# Patient Record
Sex: Male | Born: 1950
Health system: Southern US, Community
[De-identification: ages and names within clinical notes are randomized; demographics above are authoritative.]

## PROBLEM LIST (undated history)

## (undated) DIAGNOSIS — N289 Disorder of kidney and ureter, unspecified: Secondary | ICD-10-CM

## (undated) DIAGNOSIS — M1990 Unspecified osteoarthritis, unspecified site: Secondary | ICD-10-CM

## (undated) DIAGNOSIS — C61 Malignant neoplasm of prostate: Secondary | ICD-10-CM

## (undated) DIAGNOSIS — H269 Unspecified cataract: Secondary | ICD-10-CM

## (undated) DIAGNOSIS — R778 Other specified abnormalities of plasma proteins: Secondary | ICD-10-CM

## (undated) DIAGNOSIS — F419 Anxiety disorder, unspecified: Secondary | ICD-10-CM

## (undated) DIAGNOSIS — R011 Cardiac murmur, unspecified: Secondary | ICD-10-CM

## (undated) DIAGNOSIS — I739 Peripheral vascular disease, unspecified: Secondary | ICD-10-CM

## (undated) DIAGNOSIS — I10 Essential (primary) hypertension: Secondary | ICD-10-CM

## (undated) DIAGNOSIS — N400 Enlarged prostate without lower urinary tract symptoms: Secondary | ICD-10-CM

## (undated) DIAGNOSIS — D472 Monoclonal gammopathy: Principal | ICD-10-CM

## (undated) HISTORY — PX: TONSILLECTOMY: SUR1361

## (undated) HISTORY — DX: Monoclonal gammopathy: D47.2

## (undated) HISTORY — PX: EYE SURGERY: SHX253

## (undated) HISTORY — PX: PROSTATE BIOPSY: SHX241

## (undated) HISTORY — PX: BACK SURGERY: SHX140

## (undated) HISTORY — PX: HERNIA REPAIR: SHX51

## (undated) HISTORY — PX: COLONOSCOPY: SHX174

## (undated) HISTORY — DX: Peripheral vascular disease, unspecified: I73.9

## (undated) HISTORY — DX: Other specified abnormalities of plasma proteins: R77.8

## (undated) HISTORY — PX: FRACTURE SURGERY: SHX138

## (undated) HISTORY — PX: HEMORROIDECTOMY: SUR656

---

## 2000-12-20 ENCOUNTER — Emergency Department (HOSPITAL_COMMUNITY): Admission: EM | Admit: 2000-12-20 | Discharge: 2000-12-20 | Payer: Self-pay | Admitting: Emergency Medicine

## 2000-12-20 ENCOUNTER — Encounter: Payer: Self-pay | Admitting: Emergency Medicine

## 2000-12-25 ENCOUNTER — Encounter: Admission: RE | Admit: 2000-12-25 | Discharge: 2000-12-25 | Payer: Self-pay | Admitting: Internal Medicine

## 2001-01-21 ENCOUNTER — Encounter: Payer: Self-pay | Admitting: Neurosurgery

## 2001-01-24 ENCOUNTER — Ambulatory Visit (HOSPITAL_COMMUNITY): Admission: RE | Admit: 2001-01-24 | Discharge: 2001-01-25 | Payer: Self-pay | Admitting: Neurosurgery

## 2001-01-24 ENCOUNTER — Encounter: Payer: Self-pay | Admitting: Neurosurgery

## 2001-02-18 ENCOUNTER — Encounter (HOSPITAL_COMMUNITY): Admission: RE | Admit: 2001-02-18 | Discharge: 2001-03-20 | Payer: Self-pay | Admitting: Neurosurgery

## 2001-03-27 ENCOUNTER — Encounter (HOSPITAL_COMMUNITY): Admission: RE | Admit: 2001-03-27 | Discharge: 2001-04-26 | Payer: Self-pay | Admitting: Neurosurgery

## 2003-05-18 ENCOUNTER — Emergency Department (HOSPITAL_COMMUNITY): Admission: EM | Admit: 2003-05-18 | Discharge: 2003-05-18 | Payer: Self-pay | Admitting: *Deleted

## 2003-05-18 ENCOUNTER — Encounter: Payer: Self-pay | Admitting: Emergency Medicine

## 2003-12-21 ENCOUNTER — Ambulatory Visit (HOSPITAL_COMMUNITY): Admission: RE | Admit: 2003-12-21 | Discharge: 2003-12-21 | Payer: Self-pay | Admitting: Internal Medicine

## 2004-03-11 ENCOUNTER — Ambulatory Visit (HOSPITAL_COMMUNITY): Admission: RE | Admit: 2004-03-11 | Discharge: 2004-03-11 | Payer: Self-pay | Admitting: Orthopedic Surgery

## 2007-05-27 ENCOUNTER — Ambulatory Visit (HOSPITAL_COMMUNITY): Admission: RE | Admit: 2007-05-27 | Discharge: 2007-05-27 | Payer: Self-pay | Admitting: Internal Medicine

## 2009-08-31 ENCOUNTER — Ambulatory Visit (HOSPITAL_COMMUNITY): Admission: RE | Admit: 2009-08-31 | Discharge: 2009-08-31 | Payer: Self-pay | Admitting: General Surgery

## 2010-10-19 ENCOUNTER — Ambulatory Visit (HOSPITAL_COMMUNITY)
Admission: RE | Admit: 2010-10-19 | Discharge: 2010-10-19 | Payer: Self-pay | Source: Home / Self Care | Attending: Urology | Admitting: Urology

## 2011-03-31 NOTE — Op Note (Signed)
NAME:  Gregory Diaz, Gregory Diaz                         ACCOUNT NO.:  [id number]   MEDICAL RECORD NO.:  [id number]                   PATIENT TYPE:  AMB   LOCATION:  DAY                                  FACILITY:  [facility]   PHYSICIAN:  Leroy C. Katrinka Blazing, M.D.                DATE OF BIRTH:  December 23, 1950   DATE OF PROCEDURE:  DATE OF DISCHARGE:                                 OPERATIVE REPORT   PREOPERATIVE DIAGNOSIS:  Incarcerated umbilical hernia.   POSTOPERATIVE DIAGNOSIS:  Incarcerated umbilical hernia.   PROCEDURE:  Umbilical hernia repair.   SURGEON:  Dirk Dress. Katrinka Blazing, M.D.   DESCRIPTION OF PROCEDURE:  Under general anesthesia, the patient's abdomen  was prepped and draped in a sterile field.   An infraumbilical curvilinear incision was made.  The incision extended down  to the hernia sac.  The hernia sac was separated from the overlying skin  with electrocautery.  The sac was dissected down to the neck of the hernia  defect.  The fascia was opened transversely to allow the contents of the sac  to be reintroduced into the peritoneal cavity.  This was done without  difficulty.  The edges of the hernia were dissected so that good fascia  could be reapproximated.  The defect was then closed transversely using  multiple figure-of-eight sutures of 0 Prolene.  The skin was sutured down to  the fascia and subcutaneous tissue.  The skin was closed with 4-0 Dexon.  A  dressing was placed.   The patient tolerated the procedure well.  He was awakened from anesthesia  uneventfully, transferred to a bed, and taken to the postanesthetic care  unit for monitoring.      ___________________________________________                                            Dirk Dress. Katrinka Blazing, M.D.   LCS/MEDQ  D:  03/11/2004  T:  03/11/2004  Job:  [id number]   cc:   Tesfaye D. Felecia Shelling, M.D.  [address]  [place]  [place] [address]  Fax: [phone]

## 2011-03-31 NOTE — H&P (Signed)
NAME:  Gregory Diaz, Gregory Diaz                         ACCOUNT NO.:  [id number]   MEDICAL RECORD NO.:  [id number]                   PATIENT TYPE:  AMB   LOCATION:  DAY                                  FACILITY:  [facility]   PHYSICIAN:  Leroy C. Katrinka Blazing, M.D.                DATE OF BIRTH:  [birth year]   DATE OF ADMISSION:  DATE OF DISCHARGE:                                HISTORY & PHYSICAL   HISTORY OF PRESENT ILLNESS:  [age]-year-old male with history of a  painful enlarging umbilicus.  He has had no nausea or vomiting.  The  umbilicus enlarged while he was straining. But it has not reduced in size.  Examination reveals an incarcerated umbilical hernia, which is mildly  tender.  He is scheduled for umbilical hernia repair.   PAST HISTORY:  The patient has hypertension and he takes an antihypertensive  medication that he cannot remember.  He has no other medical illness.   PAST SURGICAL HISTORY:  The patient's only surgery is a hemorrhoidectomy.   SOCIAL HISTORY:  The patient is married and employed.  He does not smoke or  use drugs.   PHYSICAL EXAMINATION:  VITAL SIGNS:  On examination blood pressure is  130/90, pulse 76, respirations 18 and weight 305 pounds.  HEENT:  Unremarkable,  NECK:  Neck is supple.  CHEST:  Chest is clear to auscultation.  HEART:  Heart has a regular rate and rhythm no murmur, gallop or rub.  ABDOMEN:  Abdomen is soft and nontender, except for the periumbilical area  where he has an enlarged, mildly tender umbilical hernia.  The hernial  defect cannot be felt because of what is probably incarcerated fat.  EXTREMITIES:  No cyanosis, clubbing or edema.  NEUROLOGIC:  Neurologic exam is nonfocal.   IMPRESSION:  1. Incarcerated umbilical hernia.  2. Hypertension.   PLAN:  Umbilical hernia repair.     ___________________________________________                                         Dirk Dress. Katrinka Blazing, M.D.   LCS/MEDQ  D:  03/11/2004  T:  03/11/2004  Job:   [id number]
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
## 2011-05-06 ENCOUNTER — Emergency Department (HOSPITAL_COMMUNITY): Payer: BC Managed Care – PPO

## 2011-05-06 ENCOUNTER — Inpatient Hospital Stay (INDEPENDENT_AMBULATORY_CARE_PROVIDER_SITE_OTHER)
Admission: RE | Admit: 2011-05-06 | Discharge: 2011-05-06 | Disposition: A | Discharge status: Home / Self Care | Payer: BC Managed Care – PPO | Source: Ambulatory Visit | Attending: Family Medicine | Admitting: Family Medicine

## 2011-05-06 ENCOUNTER — Emergency Department (HOSPITAL_COMMUNITY)
Admission: EM | Admit: 2011-05-06 | Discharge: 2011-05-07 | Disposition: A | Discharge status: Home / Self Care | Payer: BC Managed Care – PPO | Attending: Emergency Medicine | Admitting: Emergency Medicine

## 2011-05-06 DIAGNOSIS — I10 Essential (primary) hypertension: Secondary | ICD-10-CM | POA: Insufficient documentation

## 2011-05-06 DIAGNOSIS — M7989 Other specified soft tissue disorders: Secondary | ICD-10-CM

## 2011-05-06 DIAGNOSIS — Y92009 Unspecified place in unspecified non-institutional (private) residence as the place of occurrence of the external cause: Secondary | ICD-10-CM | POA: Insufficient documentation

## 2011-05-06 DIAGNOSIS — IMO0002 Reserved for concepts with insufficient information to code with codable children: Secondary | ICD-10-CM | POA: Insufficient documentation

## 2011-05-06 DIAGNOSIS — M79609 Pain in unspecified limb: Secondary | ICD-10-CM | POA: Insufficient documentation

## 2011-05-06 DIAGNOSIS — W010XXA Fall on same level from slipping, tripping and stumbling without subsequent striking against object, initial encounter: Secondary | ICD-10-CM | POA: Insufficient documentation

## 2011-05-07 DIAGNOSIS — M79609 Pain in unspecified limb: Secondary | ICD-10-CM

## 2012-02-27 ENCOUNTER — Ambulatory Visit (INDEPENDENT_AMBULATORY_CARE_PROVIDER_SITE_OTHER): Payer: Self-pay | Admitting: Urology

## 2012-02-27 DIAGNOSIS — N509 Disorder of male genital organs, unspecified: Secondary | ICD-10-CM

## 2012-02-27 DIAGNOSIS — R39198 Other difficulties with micturition: Secondary | ICD-10-CM

## 2012-02-27 DIAGNOSIS — N529 Male erectile dysfunction, unspecified: Secondary | ICD-10-CM

## 2012-03-31 ENCOUNTER — Other Ambulatory Visit: Payer: Self-pay | Admitting: Family Medicine

## 2012-03-31 ENCOUNTER — Ambulatory Visit: Payer: Self-pay | Admitting: Family Medicine

## 2012-03-31 VITALS — BP 124/80 | HR 74 | Temp 97.9°F | Resp 16 | Ht 71.75 in | Wt 321.8 lb

## 2012-03-31 DIAGNOSIS — N498 Inflammatory disorders of other specified male genital organs: Secondary | ICD-10-CM

## 2012-03-31 DIAGNOSIS — I10 Essential (primary) hypertension: Secondary | ICD-10-CM | POA: Insufficient documentation

## 2012-03-31 DIAGNOSIS — N492 Inflammatory disorders of scrotum: Secondary | ICD-10-CM

## 2012-03-31 MED ORDER — DOXYCYCLINE HYCLATE 100 MG PO TABS: 100.0000 mg | ORAL_TABLET | Freq: Two times a day (BID) | ORAL | Status: AC

## 2012-03-31 NOTE — Progress Notes (Signed)
I have seen this patient with Rhoderick Moody, PA-C and agree with plan.

## 2012-03-31 NOTE — Patient Instructions (Signed)
Follow up in 2 days Take antibiotic twice a day

## 2012-03-31 NOTE — Progress Notes (Signed)
This 60 year old gentleman comes in with a recurrence of a scrotal mass on the left side. This is been diagnosed before as a boil and it was drained is primary care doctor's office. Now several years later, the swelling his back with pain and soreness. He is seeking drainage of this mass.  Objective: Patient has a 3 cm x 2 cm oval, tender subcutaneous mass in the scrotum.  The physician's assistant note is attached  Assessment: Abscess left scrotum, recurrent  Plan: Doxycycline 100 twice a day x10 days, followup in 2 days. Abscess culture has been obtained

## 2012-03-31 NOTE — Progress Notes (Signed)
VCO. Local anesthesia with 4 cc 2% lidocaine plain. 1 cm incision made with #11 blade. Copious purulent discharge expressed and culture obtained. Wound was packed with 1/4" plain packing. Area was cleaned and then dressed with Telfa and Hypafix. Patient tolerated procedure well.

## 2012-04-03 ENCOUNTER — Ambulatory Visit (INDEPENDENT_AMBULATORY_CARE_PROVIDER_SITE_OTHER): Payer: Self-pay | Admitting: Physician Assistant

## 2012-04-03 VITALS — BP 132/92 | HR 81 | Temp 97.9°F | Resp 18

## 2012-04-03 DIAGNOSIS — L0291 Cutaneous abscess, unspecified: Secondary | ICD-10-CM

## 2012-04-03 DIAGNOSIS — L0390 Cellulitis, unspecified: Secondary | ICD-10-CM

## 2012-04-03 NOTE — Progress Notes (Signed)
  Subjective:    Patient ID: Gregory Pu Hiraldo Sr., male    DOB: Apr 06, 1951, 61 y.o.   MRN: [id number]  HPI Gregory Diaz comes in today for follow up s/p I & D scrotal abscess on 03/31/12. He states that the packing came out that night and is now just coming back in,  He is taking the antibiotic without problem.  He has minimal pain and states that he has not had any new swelling or active drainage that he can tell. No fever or chills   Review of Systems As noted in HPI     Objective:   Physical Exam  Left scrotum with minimal swelling and induration.  Original incision has mostly healed closed.  No drainage expressed.        Assessment & Plan:  Abscess, scrotum  Heat and keep clean.  Return 2 days for recheck to make sure it is not swelling as there was a premature healing of incision.  Finish abx.

## 2012-04-04 LAB — WOUND CULTURE: Gram Stain: NONE SEEN

## 2012-04-15 ENCOUNTER — Other Ambulatory Visit: Payer: Self-pay | Admitting: Family Medicine

## 2012-05-24 ENCOUNTER — Encounter (HOSPITAL_COMMUNITY): Payer: Self-pay | Admitting: Emergency Medicine

## 2012-05-24 DIAGNOSIS — I10 Essential (primary) hypertension: Secondary | ICD-10-CM | POA: Insufficient documentation

## 2012-05-24 DIAGNOSIS — F17200 Nicotine dependence, unspecified, uncomplicated: Secondary | ICD-10-CM | POA: Insufficient documentation

## 2012-05-24 DIAGNOSIS — IMO0002 Reserved for concepts with insufficient information to code with codable children: Secondary | ICD-10-CM | POA: Insufficient documentation

## 2012-05-24 NOTE — ED Notes (Addendum)
Starting Wednesday,reports that thigh to calf- feels like "vibrating"; reports running fevers; pt able move L leg- pt was ambulatory to triage, but does report feeling weaker in leg; no swelling and no redness noted to leg

## 2012-05-25 ENCOUNTER — Emergency Department (HOSPITAL_COMMUNITY)
Admission: EM | Admit: 2012-05-25 | Discharge: 2012-05-25 | Disposition: A | Discharge status: Home / Self Care | Payer: Self-pay | Attending: Emergency Medicine | Admitting: Emergency Medicine

## 2012-05-25 DIAGNOSIS — M5410 Radiculopathy, site unspecified: Secondary | ICD-10-CM

## 2012-05-25 HISTORY — DX: Essential (primary) hypertension: I10

## 2012-05-25 MED ORDER — HYDROCODONE-ACETAMINOPHEN 5-325 MG PO TABS: 1.0000 | ORAL_TABLET | Freq: Four times a day (QID) | ORAL | Status: AC | PRN

## 2012-05-25 MED ORDER — HYDROCODONE-ACETAMINOPHEN 5-325 MG PO TABS
1.0000 | ORAL_TABLET | Freq: Once | ORAL | Status: AC
  Administered 2012-05-25: 1 via ORAL
  Filled 2012-05-25: qty 1

## 2012-05-25 MED ORDER — IBUPROFEN 800 MG PO TABS: 800.0000 mg | ORAL_TABLET | Freq: Three times a day (TID) | ORAL | Status: AC | PRN

## 2012-05-25 NOTE — ED Provider Notes (Signed)
History     CSN: [id number]  Arrival date & time 05/24/12  2302   First MD Initiated Contact with Patient 05/25/12 0202      Chief Complaint  Patient presents with  . Leg Pain    (Consider location/radiation/quality/duration/timing/severity/associated sxs/prior treatment) HPI Comments: Patient reports throbbing pain in left leg x 6 days.   Extends from his left hip to his left foot, mainly located in his anterior left thigh.  Pain is 9/10 intensity and intermittent - worse with turning over onto the affected side and walking.   Does have intermittent tingling and numbness in his left thigh.  He has never had a pain like this previously. Has hx L3-4 disc surgery 2003.   Denies fever, chills.  No known injury.  Pt states on exam that he no longer has this pain or any weakness or numbness, that after laying in fast track waiting to be seen he is back to normal - notes he took an 800mg  ibuprofen prior to arrival.      The history is provided by the patient.    Past Medical History  Diagnosis Date  . Hypertension     Past Surgical History  Procedure Date  . Hemorroidectomy   . Back surgery     No family history on file.  History  Substance Use Topics  . Smoking status: Current Everyday Smoker -- 1.0 packs/day  . Smokeless tobacco: Not on file  . Alcohol Use: No      Review of Systems  Constitutional: Negative for fever and chills.  Gastrointestinal: Negative for nausea, vomiting, abdominal pain and diarrhea.  Genitourinary: Negative for dysuria, urgency and frequency.  Musculoskeletal: Positive for back pain.    Allergies  Penicillins  Home Medications   Current Outpatient Rx  Name Route Sig Dispense Refill  . IBUPROFEN 200 MG PO TABS Oral Take 400 mg by mouth every 8 (eight) hours as needed. pain    . LISINOPRIL-HYDROCHLOROTHIAZIDE 20-12.5 MG PO TABS Oral Take 1 tablet by mouth daily.      BP 152/103  Pulse 92  Temp 98.2 F (36.8 C) (Oral)  Resp 22  SpO2  95%  Physical Exam  Nursing note and vitals reviewed. Constitutional: He is oriented to person, place, and time. He appears well-developed and well-nourished. No distress.  HENT:  Head: Normocephalic and atraumatic.  Neck: Neck supple.  Cardiovascular: Normal rate, regular rhythm and normal heart sounds.   Pulmonary/Chest: Breath sounds normal. No respiratory distress. He has no wheezes. He has no rales. He exhibits no tenderness.  Abdominal: Soft. Bowel sounds are normal. He exhibits no distension. There is no tenderness. There is no rebound and no guarding.  Musculoskeletal: Normal range of motion. He exhibits no edema and no tenderness.       Left hip: Normal.       Left knee: Normal.       Left ankle: Normal.       Cervical back: He exhibits no bony tenderness.       Thoracic back: He exhibits no bony tenderness.       Lumbar back: He exhibits no bony tenderness.       Lower extremities: strength 5/5 throughout, sensation intact, distal pulses intact.  Gait is normal.     Neurological: He is alert and oriented to person, place, and time.  Skin: He is not diaphoretic.    ED Course  Procedures (including critical care time)  Labs Reviewed - No data to display  No results found.  On my examination, patient had become asymptomatic.    1. Radicular pain       MDM  Patient with hx consistent with lumbar radicular pain.  On my exam he was neurologically completely intact, no deficits.  Gait is normal.  Symptoms had resolved with rest and ibuprofen.  Per history, it appears that pt has pain that when it is intense makes him feel weak in his leg.  I do not see a need for imaging at this time.  No red flags for back pain.  Pt d/c home with pain medication, PCP and neurosurgery (his own) follow up. Return precautions given.  Patient verbalizes understanding and agrees with plan.          Dillard Cannon [place], [place] 05/25/12 2035
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
## 2012-05-26 NOTE — ED Provider Notes (Signed)
Medical screening examination/treatment/procedure(s) were performed by non-physician practitioner and as supervising physician I was immediately available for consultation/collaboration.    Vida Roller, MD 05/26/12 [phone]

## 2012-09-10 ENCOUNTER — Other Ambulatory Visit (HOSPITAL_COMMUNITY): Payer: Self-pay | Admitting: Internal Medicine

## 2012-09-10 ENCOUNTER — Ambulatory Visit (HOSPITAL_COMMUNITY)
Admission: RE | Admit: 2012-09-10 | Discharge: 2012-09-10 | Disposition: A | Discharge status: Home / Self Care | Payer: BC Managed Care – PPO | Source: Ambulatory Visit | Attending: Internal Medicine | Admitting: Internal Medicine

## 2012-09-10 DIAGNOSIS — M7989 Other specified soft tissue disorders: Secondary | ICD-10-CM

## 2012-09-10 DIAGNOSIS — M79609 Pain in unspecified limb: Secondary | ICD-10-CM | POA: Insufficient documentation

## 2012-10-15 ENCOUNTER — Other Ambulatory Visit (HOSPITAL_COMMUNITY): Payer: Self-pay | Admitting: Internal Medicine

## 2012-10-23 ENCOUNTER — Other Ambulatory Visit (HOSPITAL_COMMUNITY): Payer: Self-pay | Admitting: Internal Medicine

## 2012-10-23 ENCOUNTER — Ambulatory Visit (HOSPITAL_COMMUNITY)
Admission: RE | Admit: 2012-10-23 | Discharge: 2012-10-23 | Disposition: A | Discharge status: Home / Self Care | Payer: BC Managed Care – PPO | Source: Ambulatory Visit | Attending: Internal Medicine | Admitting: Internal Medicine

## 2012-10-23 ENCOUNTER — Ambulatory Visit (HOSPITAL_COMMUNITY)
Admission: RE | Admit: 2012-10-23 | Discharge: 2012-10-23 | Disposition: A | Discharge status: Home / Self Care | Payer: BC Managed Care – PPO | Source: Ambulatory Visit | Attending: Physical Medicine and Rehabilitation | Admitting: Physical Medicine and Rehabilitation

## 2012-10-23 DIAGNOSIS — M545 Low back pain, unspecified: Secondary | ICD-10-CM | POA: Insufficient documentation

## 2012-10-23 DIAGNOSIS — M79609 Pain in unspecified limb: Secondary | ICD-10-CM | POA: Insufficient documentation

## 2012-10-23 DIAGNOSIS — I10 Essential (primary) hypertension: Secondary | ICD-10-CM | POA: Insufficient documentation

## 2012-10-23 DIAGNOSIS — IMO0001 Reserved for inherently not codable concepts without codable children: Secondary | ICD-10-CM | POA: Insufficient documentation

## 2012-10-23 DIAGNOSIS — M5410 Radiculopathy, site unspecified: Secondary | ICD-10-CM | POA: Insufficient documentation

## 2012-10-23 NOTE — Evaluation (Addendum)
Physical Therapy Evaluation  Patient Details  Name: Gregory Gervase Lemus Sr. MRN: [id number] Date of Birth: May 10, 1951  Today's Date: 10/23/2012 Time: 1110-1205 PT Time Calculation (min): 55 min Charges: 1 eval, 10' NMR Visit#: 1  of 12   Re-eval: 11/22/12 Assessment Diagnosis: R leg pain Next MD Visit: Dr. Ethelene Hal - 11/22/11  Past Medical History:  Past Medical History  Diagnosis Date  . Hypertension    Past Surgical History:  Past Surgical History  Procedure Date  . Hemorroidectomy   . Back surgery     Subjective Symptoms/Limitations Pertinent History: Pt is a referred to PT for L leg pain with a significant hx of postlamiectomy about 10 years ago.  he reports that 10 weeks ago he had the flu and he was coughing a lot which increased his pain to his low back and had shooting pain, numbness, tingling, to L LE.  He is currently taking the short dose of prednisone.   How long can you sit comfortably?: less than 30 minutes How long can you stand comfortably?: 2-3 minutes How long can you walk comfortably?: less than 100 feet Pain Assessment Currently in Pain?: Yes Pain Score:   8 Pain Location: Back Pain Orientation: Left Pain Type: Acute pain;Chronic pain Pain Radiating Towards: Left leg Pain Relieving Factors: prednisone Effect of Pain on Daily Activities: unable to recieve comfortable postion, pain during work.   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 ) Coordination Gross Motor Movements are Fluid and Coordinated: No Coordination and  Movement Description: maximal impairment to core musculature Functional Tests Functional Tests: ODI: 72%  Assessment LLE AROM (degrees) LLE Overall AROM Comments: Prone hip IR -10 degrees to neutral LLE Strength Left Hip Flexion: 4/5 Left Hip Extension: 3-/5 Left Hip ABduction: 3+/5 Left Knee Flexion: 4/5 Left Knee Extension: 4/5 Left Ankle Dorsiflexion: 4/5 Lumbar AROM Overall Lumbar AROM Comments: All WNL without back pain Palpation Palpation: pain and tenderness to L gluteal region, anterior hip flexor.   Mobility/Balance  Ambulation/Gait Ambulation/Gait: Yes Gait Pattern: Antalgic;Trunk flexed Posture/Postural Control Posture/Postural Control: Postural limitations Postural Limitations: significant upper cross and lower cross syndrome.  Static Standing Balance Single Leg Stance - Right Leg: 2  Single Leg Stance - Left Leg: 3  Tandem Stance - Right Leg: 3  (increased leg pain) Tandem Stance - Left Leg: 3  (increased leg pain) Rhomberg - Eyes Opened: 10  Rhomberg - Eyes Closed: 10    Exercise/Treatments Seated Other Seated Lumbar Exercises: Heel roll outs 3x10 sec Supine Ab Set: Limitations AB Set Limitations: TC and VC for activation Bridge: 10 reps Other Supine Lumbar Exercises: NMR for PF contraction using VC Sidelying Hip Abduction: 10 reps Quadruped Other Quadruped Lumbar Exercises: NMR for multifidus activation    Physical Therapy Assessment and Plan PT Assessment and Plan Clinical Impression Statement: Pt is a 61 year old male referred to PT seconday to L leg radiculopathy without back pain.  After examination it was found that he has significant core weakness, postural abnormalities and singificant impairment to hip ROM.  Pt will benefit from skilled therapeutic intervention in order to improve on the following deficits: Abnormal gait;Decreased balance;Decreased coordination;Decreased  activity tolerance;Decreased strength;Pain;Improper body  mechanics;Impaired sensation;Impaired tone Rehab Potential: Good PT Frequency: Min 3X/week PT Duration: 8 weeks PT Treatment/Interventions: Gait training;Stair training;Functional mobility training;Therapeutic activities;Therapeutic exercise;Balance training;Neuromuscular re-education;Patient/family education;Manual techniques;Modalities PT Plan: lumbar traction if needed.  Concentrate on core activitiation, heel and toe roll in and outs, bridges, HS stretch, nerve glides if necessary, encourage appropriate posture.  Progress to t-band     Goals Home Exercise Program Pt will Perform Home Exercise Program: Independently PT Goal: Perform Home Exercise Program - Progress: Goal set today PT Short Term Goals Time to Complete Short Term Goals: 4 weeks PT Short Term Goal 1: Pt will report pain less than 5/10 for 50% of his day for improved QOL.  PT Short Term Goal 2: Pt will improve his LE strength by 1 muscle grade. PT Short Term Goal 3: Pt will report decrease in radicular symptoms by 50% and report improvement in symtoms.  PT Short Term Goal 4: Pt will demonstrate independent core coordinated movements. PT Short Term Goal 5: Pt will be throughly educated on proper lifiting techniques and be able to verablize importance of posture.  PT Long Term Goals Time to Complete Long Term Goals: 8 weeks PT Long Term Goal 1: Pt will improve ODI to less than 40% for improved QOL.  PT Long Term Goal 2: Pt will improve core coordination to [facility] in order to tolerate standing for greater than 1 hour to continue with home activities.  Long Term Goal 3: Pt will improve hip and ankle strategy and demonstrate static standing balance on solid surface x30 on R and L LE  Long Term Goal 4: Pt will improve core and LE strength to WNL in order to tolerate standing and walking for greater than 30 minutes to improve QOL.   Problem List Patient Active Problem List  Diagnosis  . Hypertension  . Radiculopathy of leg   Niccole Witthuhn,PT 10/23/2012, 1:48 PM  Physician Documentation Your signature is required to indicate approval of the treatment plan as stated above.  Please sign and either send electronically or make a copy of this report for your files and return this physician signed original.   Please mark one 1.__approve of plan  2. ___approve of plan with the following conditions.   ______________________________                                                          _____________________ Physician Signature                                                                                                             Date

## 2012-10-25 ENCOUNTER — Ambulatory Visit (HOSPITAL_COMMUNITY): Payer: BC Managed Care – PPO

## 2012-10-29 ENCOUNTER — Ambulatory Visit (HOSPITAL_COMMUNITY)
Admission: RE | Admit: 2012-10-29 | Discharge: 2012-10-29 | Disposition: A | Discharge status: Home / Self Care | Payer: BC Managed Care – PPO | Source: Ambulatory Visit | Attending: Physical Medicine and Rehabilitation | Admitting: Physical Medicine and Rehabilitation

## 2012-10-29 ENCOUNTER — Other Ambulatory Visit (HOSPITAL_COMMUNITY): Payer: Self-pay | Admitting: Physical Medicine and Rehabilitation

## 2012-10-29 DIAGNOSIS — M545 Low back pain, unspecified: Secondary | ICD-10-CM

## 2012-10-29 NOTE — Progress Notes (Signed)
Physical Therapy Treatment Patient Details  Name: Gregory Bertino Daw Sr. MRN: [id number] Date of Birth: Feb 04, 1951  Today's Date: 10/29/2012 Time: 4098-1191 PT Time Calculation (min): 67 min  Visit#: 2  of 12   Re-eval: 11/22/12  Charge: therex 42', manual 8', traction 17'   Subjective: Symptoms/Limitations Symptoms: Pt reported anterior L groin pain with radicular pain to lateral lower LE pain scale 7/10. Pain Assessment Currently in Pain?: Yes Pain Score:   7 Pain Location: Leg Pain Orientation: Left  Objective:   Exercise/Treatments Stretches Passive Hamstring Stretch: 3 reps;30 seconds Standing Other Standing Lumbar Exercises: gastroc stretch with slant board 2x 30" Seated Other Seated Lumbar Exercises: Heel roll outs 10x10 sec with orange ball between knees Supine Ab Set: Limitations AB Set Limitations: TC and VC for TrA and PFC activation 10x 10" Bridge: 10 reps Sidelying Hip Abduction: 10 reps Prone  Other Prone Lumbar Exercises: NMR for multifidus activation max tactile cueing 5x 10"  Modalities Modalities: Traction Manual Therapy Manual Therapy: Other (comment) Other Manual Therapy: Sciatic nerve glides L LE x 8 min Traction Type of Traction: Lumbar Max (lbs): 140 static Hold Time: static Time: 53'  Physical Therapy Assessment and Plan PT Assessment and Plan Clinical Impression Statement: Began PT treatment for core and LE strengthening, manual techniques for sciatic nerve glides and ended session with lumbar traction to reduce radiating pain.  Pt required tactile cueing for TrA activation and cueing for posture with sitting activity.  Pt stated pain scale overall reduced and no longer c/o radiating pain to foot but does have pain lateral calf region and groin following traction.  Pt recommend to drink extra water following session to reduce risk of headaches following new modality. PT Plan: Assess relief following lumbar traction.  Continue with  concentration on core activitation.  Progress to tband for postural strengthening.    Goals    Problem List Patient Active Problem List  Diagnosis  . Hypertension  . Radiculopathy of leg    PT - End of Session Activity Tolerance: Patient tolerated treatment well General Behavior During Session: [facility] for tasks performed Cognition: [facility] for tasks performed  GP    Juel Burrow 10/29/2012, 12:27 PM

## 2012-10-30 ENCOUNTER — Ambulatory Visit (HOSPITAL_COMMUNITY)
Admission: RE | Admit: 2012-10-30 | Discharge: 2012-10-30 | Disposition: A | Discharge status: Home / Self Care | Payer: BC Managed Care – PPO | Source: Ambulatory Visit | Attending: Physical Medicine and Rehabilitation | Admitting: Physical Medicine and Rehabilitation

## 2012-10-30 NOTE — Progress Notes (Signed)
Physical Therapy Treatment Patient Details  Name: Gregory Beier Newhouse Sr. MRN: [id number] Date of Birth: [birth year]  Today's Date: 10/30/2012 Time: 4098-1191 PT Time Calculation (min): 62 min Charges: 10' manual, 25' NMR, 1 Traction Visit#: 3  of 12   Re-eval: 11/22/12   Subjective: Symptoms/Limitations Symptoms: I'm still just having constant pain down my leg.  Pain Assessment Currently in Pain?: Yes Pain Score:   7 Pain Location: Back Pain Orientation: Left Pain Type: Acute pain;Chronic pain  Precautions/Restrictions     Exercise/Treatments Stretches Prone on Elbows Stretch: 1 rep;Limitations Prone on Elbows Stretch Limitations: 2 minutes Standing Scapular Retraction: Both;10 reps;Theraband (VC's and TC's for posture) Theraband Level (Scapular Retraction): Level 3 (Green) Row: Both;10 reps;Theraband (VC's and TC's for posture) Theraband Level (Row): Level 3 (Green) Shoulder Extension: Both;10 reps;Theraband (VC's and TC's for posture) Theraband Level (Shoulder Extension): Level 3 (Green) Other Standing Lumbar Exercises: R hip sag x15 Other Standing Lumbar Exercises: standing postural education x5 minutes w/VC and TC's Seated Other Seated Lumbar Exercises: seated postural training w/VC and TC for correct posture and TrA contraction 10x10 sec holds Sidelying Hip Abduction: 10 reps Other Sidelying Lumbar Exercises: R S/L press up x 2 minutes Prone  Single Arm Raise: Right;Left;5 reps Straight Leg Raise: 10 reps (Right and Left)  Modalities Modalities: Traction Manual Therapy Manual Therapy: Other (comment) Other Manual Therapy: Prone nerve glides  Traction Type of Traction: Lumbar Max (lbs): 140 static Hold Time: static Time: 69'  Physical Therapy Assessment and Plan PT Assessment and Plan Clinical Impression Statement: Pt has significant weakness to L LE and needs constant cueing for motivation and to continue.  heavy focus on improving sitting and standing  posture and cueing to maintain posture,  Encouraged pt to continue with core activiation and work on posture during driving for work.  he responded best to standing R hip sag to decrease LE radicular intensity. Reports decrease in pain after traction.  PT Plan: Assess relief following lumbar traction.  Continue with concentration on core activitation and addressing goals.     Goals    Problem List Patient Active Problem List  Diagnosis  . Hypertension  . Radiculopathy of leg    PT - End of Session Activity Tolerance: Patient tolerated treatment well General Behavior During Session: [facility] for tasks performed Cognition: [facility] for tasks performed  Arnett Duddy, PT 10/30/2012, 9:53 AM

## 2012-11-05 ENCOUNTER — Ambulatory Visit (HOSPITAL_COMMUNITY)
Admission: RE | Admit: 2012-11-05 | Discharge: 2012-11-05 | Disposition: A | Discharge status: Home / Self Care | Payer: BC Managed Care – PPO | Source: Ambulatory Visit | Attending: Physical Medicine and Rehabilitation | Admitting: Physical Medicine and Rehabilitation

## 2012-11-05 NOTE — Progress Notes (Signed)
Physical Therapy Treatment Patient Details  Name: Gregory Hamada Middlekauff Sr. MRN: [id number] Date of Birth: January 11, 1951  Today's Date: 11/05/2012 Time: 4098-1191 PT Time Calculation (min): 66 min  Visit#: 4  of 12   Re-eval: 11/22/12  Charge: therex 23 (PT aide ended therex session- no charge) ', traction 17'   Subjective: Symptoms/Limitations Symptoms: Pt stated pain reduced following last session, pt reported L knee and foot numbness pain scale 7/10. Pain Assessment Currently in Pain?: Yes Pain Score:   7 Pain Location: Leg Pain Orientation: Left  Objective:  Exercise/Treatments Standing Scapular Retraction: 15 reps;Theraband Theraband Level (Scapular Retraction): Level 3 (Green) Row: Both;10 reps;Theraband Theraband Level (Row): Level 3 (Green) Shoulder Extension: Both;10 reps;Theraband Theraband Level (Shoulder Extension): Level 3 (Green) Other Standing Lumbar Exercises: R hip sag x10 Seated Other Seated Lumbar Exercises: heel roll out Sidelying Hip Abduction: 10 reps Other Sidelying Lumbar Exercises: R S/L press up x 2 minutes Prone  Single Arm Raise: Right;Left;5 reps Straight Leg Raise: 10 reps  Modalities Modalities: Traction Traction Type of Traction: Lumbar Max (lbs): 140 static Hold Time: static Time: 27'  Physical Therapy Assessment and Plan PT Assessment and Plan Clinical Impression Statement: Pt continues to required constant cueing for motivation for complete exercises with correct form, posture and correct musculature activtion.  Pt limited by pain with standing activties and required several restbreaks to complete tasks.  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 PT Plan: Continue with concentraction on core activation and work towards goals.      Goals    Problem List Patient Active Problem List  Diagnosis  . Hypertension  . Radiculopathy of leg    PT - End of  Session Activity Tolerance: Patient tolerated treatment well General Behavior During Session: [facility] for tasks performed Cognition: [facility] for tasks performed  GP    Juel Burrow 11/05/2012, 1:12 PM

## 2012-11-12 ENCOUNTER — Ambulatory Visit (HOSPITAL_COMMUNITY): Payer: BC Managed Care – PPO

## 2012-11-12 ENCOUNTER — Inpatient Hospital Stay (HOSPITAL_COMMUNITY): Admission: RE | Admit: 2012-11-12 | Payer: BC Managed Care – PPO | Source: Ambulatory Visit

## 2012-11-19 ENCOUNTER — Ambulatory Visit (HOSPITAL_COMMUNITY): Payer: BC Managed Care – PPO | Admitting: Physical Therapy

## 2012-11-20 ENCOUNTER — Ambulatory Visit (HOSPITAL_COMMUNITY): Payer: BC Managed Care – PPO | Admitting: Physical Therapy

## 2012-11-26 ENCOUNTER — Ambulatory Visit (HOSPITAL_COMMUNITY)
Admission: RE | Admit: 2012-11-26 | Discharge: 2012-11-26 | Disposition: A | Discharge status: Home / Self Care | Payer: BC Managed Care – PPO | Source: Ambulatory Visit | Attending: Physical Medicine and Rehabilitation | Admitting: Physical Medicine and Rehabilitation

## 2012-11-26 DIAGNOSIS — I10 Essential (primary) hypertension: Secondary | ICD-10-CM | POA: Insufficient documentation

## 2012-11-26 DIAGNOSIS — M545 Low back pain, unspecified: Secondary | ICD-10-CM | POA: Insufficient documentation

## 2012-11-26 DIAGNOSIS — M79609 Pain in unspecified limb: Secondary | ICD-10-CM | POA: Insufficient documentation

## 2012-11-26 DIAGNOSIS — IMO0001 Reserved for inherently not codable concepts without codable children: Secondary | ICD-10-CM | POA: Insufficient documentation
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
## 2012-11-27 ENCOUNTER — Ambulatory Visit (HOSPITAL_COMMUNITY): Payer: BC Managed Care – PPO | Admitting: Physical Therapy

## 2012-12-05 ENCOUNTER — Other Ambulatory Visit: Payer: Self-pay | Admitting: Family Medicine

## 2012-12-05 ENCOUNTER — Ambulatory Visit (HOSPITAL_COMMUNITY)
Admission: EM | Admit: 2012-12-05 | Discharge: 2012-12-05 | Disposition: A | Discharge status: Home / Self Care | Payer: BC Managed Care – PPO | Source: Ambulatory Visit | Attending: Family Medicine | Admitting: Family Medicine

## 2012-12-05 ENCOUNTER — Ambulatory Visit (HOSPITAL_COMMUNITY)
Admission: RE | Admit: 2012-12-05 | Discharge: 2012-12-05 | Disposition: A | Discharge status: Home / Self Care | Payer: BC Managed Care – PPO | Source: Ambulatory Visit | Attending: Family Medicine | Admitting: Family Medicine

## 2012-12-05 DIAGNOSIS — H9319 Tinnitus, unspecified ear: Secondary | ICD-10-CM | POA: Insufficient documentation

## 2012-12-05 DIAGNOSIS — G319 Degenerative disease of nervous system, unspecified: Secondary | ICD-10-CM | POA: Insufficient documentation

## 2012-12-05 DIAGNOSIS — R209 Unspecified disturbances of skin sensation: Secondary | ICD-10-CM | POA: Insufficient documentation

## 2012-12-05 DIAGNOSIS — S0990XA Unspecified injury of head, initial encounter: Secondary | ICD-10-CM

## 2012-12-19 ENCOUNTER — Telehealth: Payer: Self-pay | Admitting: Radiology
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
## 2012-12-20 NOTE — Telephone Encounter (Signed)
Patient came in to be seen last night to discuss CT results

## 2013-01-26 ENCOUNTER — Encounter (HOSPITAL_COMMUNITY): Payer: Self-pay

## 2013-01-26 ENCOUNTER — Emergency Department (HOSPITAL_COMMUNITY)
Admission: EM | Admit: 2013-01-26 | Discharge: 2013-01-26 | Disposition: A | Discharge status: Home / Self Care | Payer: Self-pay | Attending: Emergency Medicine | Admitting: Emergency Medicine

## 2013-01-26 DIAGNOSIS — L0291 Cutaneous abscess, unspecified: Secondary | ICD-10-CM

## 2013-01-26 DIAGNOSIS — F17200 Nicotine dependence, unspecified, uncomplicated: Secondary | ICD-10-CM | POA: Insufficient documentation

## 2013-01-26 DIAGNOSIS — I10 Essential (primary) hypertension: Secondary | ICD-10-CM | POA: Insufficient documentation

## 2013-01-26 DIAGNOSIS — N498 Inflammatory disorders of other specified male genital organs: Secondary | ICD-10-CM | POA: Insufficient documentation

## 2013-01-26 DIAGNOSIS — Z79899 Other long term (current) drug therapy: Secondary | ICD-10-CM | POA: Insufficient documentation

## 2013-01-26 MED ORDER — LIDOCAINE HCL (PF) 1 % IJ SOLN
5.0000 mL | Freq: Once | INTRAMUSCULAR | Status: DC
  Filled 2013-01-26: qty 5

## 2013-01-26 MED ORDER — HYDROCODONE-ACETAMINOPHEN 5-325 MG PO TABS
1.0000 | ORAL_TABLET | Freq: Once | ORAL | Status: AC
  Administered 2013-01-26: 1 via ORAL
  Filled 2013-01-26: qty 1

## 2013-01-26 NOTE — ED Provider Notes (Signed)
History     CSN: [id number]  Arrival date & time 01/26/13  0418   First MD Initiated Contact with Patient 01/26/13 [phone]      Chief Complaint  Patient presents with  . Recurrent Skin Infections    (Consider location/radiation/quality/duration/timing/severity/associated sxs/prior treatment) HPI Gregory A Ports Sr. is a 62 y.o. male who presents to the Emergency Department complaining of a scrotal abscess that has developed over the last several days since he was drinking grape crush.  He is here to have it opened.  PCp Dr. Regino Schultze Past Medical History  Diagnosis Date  . Hypertension     Past Surgical History  Procedure Laterality Date  . Hemorroidectomy    . Back surgery      History reviewed. No pertinent family history.  History  Substance Use Topics  . Smoking status: Current Every Day Smoker -- 1.00 packs/day  . Smokeless tobacco: Not on file  . Alcohol Use: No      Review of Systems  Constitutional: Negative for fever.       10 Systems reviewed and are negative for acute change except as noted in the HPI.  HENT: Negative for congestion.   Eyes: Negative for discharge and redness.  Respiratory: Negative for cough and shortness of breath.   Cardiovascular: Negative for chest pain.  Gastrointestinal: Negative for vomiting and abdominal pain.  Genitourinary: Positive for scrotal swelling.  Musculoskeletal: Negative for back pain.  Skin: Negative for rash.  Neurological: Negative for syncope, numbness and headaches.  Psychiatric/Behavioral:       No behavior change.    Allergies  Penicillins  Home Medications   Current Outpatient Rx  Name  Route  Sig  Dispense  Refill  . lisinopril-hydrochlorothiazide (PRINZIDE,ZESTORETIC) 20-12.5 MG per tablet   Oral   Take 1 tablet by mouth daily.         Marland Kitchen ibuprofen (ADVIL,MOTRIN) 200 MG tablet   Oral   Take 400 mg by mouth every 8 (eight) hours as needed. pain           BP 143/72  Pulse 86  Temp(Src)  98 F (36.7 C) (Oral)  Resp 18  Ht 6\' 2"  (1.88 m)  Wt 299 lb (135.626 kg)  BMI 38.37 kg/m2  SpO2 94%  Physical Exam  Nursing note and vitals reviewed. Constitutional: He appears well-developed and well-nourished.  Awake, alert, nontoxic appearance.  HENT:  Head: Normocephalic and atraumatic.  Eyes: EOM are normal. Pupils are equal, round, and reactive to light. Right eye exhibits no discharge. Left eye exhibits no discharge.  Neck: Neck supple.  Pulmonary/Chest: Effort normal. He exhibits no tenderness.  Abdominal: Soft. There is no tenderness. There is no rebound.  Genitourinary:  Circumcised. Left sided area of tenderness and swelling to scrotum.  Musculoskeletal: He exhibits no tenderness.  Baseline ROM, no obvious new focal weakness.  Neurological:  Mental status and motor strength appears baseline for patient and situation.  Skin: No rash noted.  Psychiatric: He has a normal mood and affect.    ED Course  Procedures (including critical care time) INCISION AND DRAINAGE Performed by: Annamarie Dawley Consent: Verbal consent obtained. Risks and benefits: risks, benefits and alternatives were discussed Type: abscess Body area: scrotum Anesthesia: local infiltration Incision was made with a scalpel. Local anesthetic: lidocaine 1% w/o epinephrine Anesthetic total: 1 ml Complexity: complex Blunt dissection to break up loculations Drainage: blood only Patient tolerance: Patient tolerated the procedure well with no immediate complications. 30 minutes  MDM  Patient with abscess to scrotum. I%D of abscess. Dressing applied. Pt stable in ED with no significant deterioration in condition.The patient appears reasonably screened and/or stabilized for discharge and I doubt any other medical condition or other [facility] requiring further screening, evaluation, or treatment in the ED at this time prior to discharge.  MDM Reviewed: nursing note and vitals           Nicoletta Dress.  Colon Branch, MD 01/26/13 1610

## 2013-01-26 NOTE — ED Notes (Signed)
Recurrent boil on left groin

## 2013-01-28 ENCOUNTER — Ambulatory Visit: Payer: Self-pay | Admitting: Emergency Medicine

## 2013-01-28 VITALS — BP 114/66 | HR 75 | Temp 98.0°F | Resp 20 | Ht 70.75 in | Wt 302.6 lb

## 2013-01-28 DIAGNOSIS — N498 Inflammatory disorders of other specified male genital organs: Secondary | ICD-10-CM

## 2013-01-28 DIAGNOSIS — N492 Inflammatory disorders of scrotum: Secondary | ICD-10-CM

## 2013-01-28 DIAGNOSIS — N433 Hydrocele, unspecified: Secondary | ICD-10-CM

## 2013-01-28 MED ORDER — CLINDAMYCIN HCL 300 MG PO CAPS: 300.0000 mg | ORAL_CAPSULE | Freq: Three times a day (TID) | ORAL | Status: DC

## 2013-01-28 MED ORDER — HYDROCODONE-ACETAMINOPHEN 5-325 MG PO TABS
1.0000 | ORAL_TABLET | ORAL | Status: DC | PRN
Start: 2013-01-28 — End: 2014-05-08

## 2013-01-28 MED ORDER — CIPROFLOXACIN HCL 500 MG PO TABS: 500.0000 mg | ORAL_TABLET | Freq: Two times a day (BID) | ORAL | Status: DC

## 2013-01-28 NOTE — Patient Instructions (Addendum)
Abscess An abscess is an infected area that contains a collection of pus and debris. It can occur in almost any part of the body. An abscess is also known as a furuncle or boil. CAUSES   An abscess occurs when tissue gets infected. This can occur from blockage of oil or sweat glands, infection of hair follicles, or a minor injury to the skin. As the body tries to fight the infection, pus collects in the area and creates pressure under the skin. This pressure causes pain. People with weakened immune systems have difficulty fighting infections and get certain abscesses more often.   SYMPTOMS Usually an abscess develops on the skin and becomes a painful mass that is red, warm, and tender. If the abscess forms under the skin, you may feel a moveable soft area under the skin. Some abscesses break open (rupture) on their own, but most will continue to get worse without care. The infection can spread deeper into the body and eventually into the bloodstream, causing you to feel ill.   DIAGNOSIS   Your caregiver will take your medical history and perform a physical exam. A sample of fluid may also be taken from the abscess to determine what is causing your infection. TREATMENT   Your caregiver may prescribe antibiotic medicines to fight the infection. However, taking antibiotics alone usually does not cure an abscess. Your caregiver may need to make a small cut (incision) in the abscess to drain the pus. In some cases, gauze is packed into the abscess to reduce pain and to continue draining the area. HOME CARE INSTRUCTIONS    Only take over-the-counter or prescription medicines for pain, discomfort, or fever as directed by your caregiver.   If you were prescribed antibiotics, take them as directed. Finish them even if you start to feel better.   If gauze is used, follow your caregiver's directions for changing the gauze.   To avoid spreading the infection:   Keep your draining abscess covered with a  bandage.   Wash your hands well.   Do not share personal care items, towels, or whirlpools with others.   Avoid skin contact with others.   Keep your skin and clothes clean around the abscess.   Keep all follow-up appointments as directed by your caregiver.  SEEK MEDICAL CARE IF:    You have increased pain, swelling, redness, fluid drainage, or bleeding.   You have muscle aches, chills, or a general ill feeling.   You have a fever.  MAKE SURE YOU:    Understand these instructions.   Will watch your condition.   Will get help right away if you are not doing well or get worse.  Document Released: 08/09/2005 Document Revised: 04/30/2012 Document Reviewed: 01/12/2012 [employer] Patient Information 2013 [employer].    

## 2013-01-28 NOTE — Progress Notes (Addendum)
[facility] [address], [place] [address] [phone]  Date:  01/28/2013   Name:  Gregory Diaz   DOB:  12/09/1950   MRN:  [id number]  PCP:  Kirk Ruths, MD    Chief Complaint: Abscess   History of Present Illness:  Gregory Diaz is a 62 y.o. very pleasant male patient who presents with the following:  Seen on the weekend for abscess scrotum.  Was drained.  Wound immediately closed and is now much larger abscess. Denies fever or chills. No history of trauma.    Patient Active Problem List  Diagnosis  . Hypertension  . Radiculopathy of leg    Past Medical History  Diagnosis Date  . Hypertension     Past Surgical History  Procedure Laterality Date  . Hemorroidectomy    . Back surgery      History  Substance Use Topics  . Smoking status: Current Every Day Smoker -- 1.00 packs/day  . Smokeless tobacco: Not on file  . Alcohol Use: No    History reviewed. No pertinent family history.  Allergies  Allergen Reactions  . Penicillins Swelling    Childhood     Medication list has been reviewed and updated.  Current Outpatient Prescriptions on File Prior to Visit  Medication Sig Dispense Refill  . ibuprofen (ADVIL,MOTRIN) 200 MG tablet Take 400 mg by mouth every 8 (eight) hours as needed. pain      . lisinopril-hydrochlorothiazide (PRINZIDE,ZESTORETIC) 20-12.5 MG per tablet Take 1 tablet by mouth daily.       No current facility-administered medications on file prior to visit.    Review of Systems:  As per HPI, otherwise negative.    Physical Examination: Filed Vitals:   01/28/13 1800  BP: 114/66  Pulse: 75  Temp: 98 F (36.7 C)  Resp: 20   Filed Vitals:   01/28/13 1800  Height: 5' 10.75" (1.797 m)  Weight: 302 lb 9.6 oz (137.258 kg)   Body mass index is 42.51 kg/(m^2). Ideal Body Weight: Weight in (lb) to have BMI = 25: 177.6   GEN: WDWN, NAD, Non-toxic, Alert & Oriented x 3 HEENT: Atraumatic,  Normocephalic.  Ears and Nose: No external deformity. EXTR: No clubbing/cyanosis/edema NEURO: Normal gait.  PSYCH: Normally interactive. Conversant. Not depressed or anxious appearing.  Calm demeanor.  GENITALIA:  Normal male circumcised phallus. Scrotum markedly edematous and has large reactive hydrocele on left and abscess left scrotum.  Testes normal bilaterally  Assessment and Plan: Abscess scrotum Hydrocele vicodin cipro Clindamycin Urology follow up   Signed,  Phillips Odor, MD   Abscess prepped with betadine and infiltrated with lidocaine.  Opened with a longitudinal incision with the immediate release of approximately 200 ml pus under pressure.  Immediate pain relief.  Dressed and left the room in good condition.

## 2013-01-31 LAB — WOUND CULTURE: Gram Stain: NONE SEEN

## 2013-03-18 ENCOUNTER — Ambulatory Visit: Payer: BC Managed Care – PPO | Admitting: Urology

## 2013-12-10 DIAGNOSIS — Z0271 Encounter for disability determination: Secondary | ICD-10-CM

## 2014-01-01 ENCOUNTER — Other Ambulatory Visit (HOSPITAL_COMMUNITY): Payer: Self-pay | Admitting: Family Medicine

## 2014-01-01 ENCOUNTER — Ambulatory Visit (HOSPITAL_COMMUNITY)
Admission: RE | Admit: 2014-01-01 | Discharge: 2014-01-01 | Disposition: A | Discharge status: Home / Self Care | Payer: 59 | Source: Ambulatory Visit | Attending: Family Medicine | Admitting: Family Medicine

## 2014-01-01 DIAGNOSIS — M5030 Other cervical disc degeneration, unspecified cervical region: Secondary | ICD-10-CM | POA: Insufficient documentation

## 2014-01-01 DIAGNOSIS — M542 Cervicalgia: Secondary | ICD-10-CM

## 2014-01-01 DIAGNOSIS — M546 Pain in thoracic spine: Secondary | ICD-10-CM | POA: Insufficient documentation

## 2014-01-01 DIAGNOSIS — M549 Dorsalgia, unspecified: Secondary | ICD-10-CM

## 2014-01-01 DIAGNOSIS — M25569 Pain in unspecified knee: Secondary | ICD-10-CM

## 2014-01-01 DIAGNOSIS — M51379 Other intervertebral disc degeneration, lumbosacral region without mention of lumbar back pain or lower extremity pain: Secondary | ICD-10-CM | POA: Insufficient documentation

## 2014-01-01 DIAGNOSIS — M5137 Other intervertebral disc degeneration, lumbosacral region: Secondary | ICD-10-CM | POA: Insufficient documentation

## 2014-01-01 DIAGNOSIS — M545 Low back pain, unspecified: Secondary | ICD-10-CM | POA: Insufficient documentation

## 2014-05-07 ENCOUNTER — Other Ambulatory Visit: Payer: Self-pay | Admitting: Neurosurgery

## 2014-05-08 ENCOUNTER — Encounter (HOSPITAL_COMMUNITY): Payer: Self-pay

## 2014-05-11 NOTE — Pre-Procedure Instructions (Signed)
Gregory Diaz  05/11/2014   Your procedure is scheduled on:  Mon, July 6 @ 4:20 PM  Report to [facility]  at 2:15 PM.  Call this number if you have problems the morning of surgery: [phone]   Remember:   Do not eat food or drink liquids after midnight.                 Stop taking your Mobic and Ibuprofen. No Goody's,BC's,Aleve,Aspirin,Fish Oil,or any Herbal Medications   Do not wear jewelry  Do not wear lotions, powders, or colognes. You may wear deodorant.  Men may shave face and neck.  Do not bring valuables to the hospital.  [facility] is not responsible                  for any belongings or valuables.               Contacts, dentures or bridgework may not be worn into surgery.  Leave suitcase in the car. After surgery it may be brought to your room.  For patients admitted to the hospital, discharge time is determined by your                treatment team.                   Special Instructions:  [place] - Preparing for Surgery  Before surgery, you can play an important role.  Because skin is not sterile, your skin needs to be as free of germs as possible.  You can reduce the number of germs on you skin by washing with CHG (chlorahexidine gluconate) soap before surgery.  CHG is an antiseptic cleaner which kills germs and bonds with the skin to continue killing germs even after washing.  Please DO NOT use if you have an allergy to CHG or antibacterial soaps.  If your skin becomes reddened/irritated stop using the CHG and inform your nurse when you arrive at Short Stay.  Do not shave (including legs and underarms) for at least 48 hours prior to the first CHG shower.  You may shave your face.  Please follow these instructions carefully:   1.  Shower with CHG Soap the night before surgery and the                                morning of Surgery.  2.  If you choose to wash your hair, wash your hair first as usual with your       normal shampoo.  3.  After you  shampoo, rinse your hair and body thoroughly to remove the                      Shampoo.  4.  Use CHG as you would any other liquid soap.  You can apply chg directly       to the skin and wash gently with scrungie or a clean washcloth.  5.  Apply the CHG Soap to your body ONLY FROM THE NECK DOWN.        Do not use on open wounds or open sores.  Avoid contact with your eyes,       ears, mouth and genitals (private parts).  Wash genitals (private parts)       with your normal soap.  6.  Wash thoroughly, paying special attention to the area where your  surgery        will be performed.  7.  Thoroughly rinse your body with warm water from the neck down.  8.  DO NOT shower/wash with your normal soap after using and rinsing off       the CHG Soap.  9.  Pat yourself dry with a clean towel.            10.  Wear clean pajamas.            11.  Place clean sheets on your bed the night of your first shower and do not        sleep with pets.  Day of Surgery  Do not apply any lotions/deoderants the morning of surgery.  Please wear clean clothes to the hospital/surgery center.     Please read over the following fact sheets that you were given: Pain Booklet, Coughing and Deep Breathing, MRSA Information and Surgical Site Infection Prevention

## 2014-05-12 ENCOUNTER — Inpatient Hospital Stay (HOSPITAL_COMMUNITY)
Admission: RE | Admit: 2014-05-12 | Discharge: 2014-05-12 | Disposition: A | Discharge status: Home / Self Care | Payer: BC Managed Care – PPO | Source: Ambulatory Visit

## 2014-05-14 NOTE — Progress Notes (Signed)
Spoke with pt regarding surgery. Pt stated that he is cancelling surgery and failed to make MD aware. Pt advised to notify MD of cancellation.

## 2014-05-18 ENCOUNTER — Inpatient Hospital Stay (HOSPITAL_COMMUNITY): Admission: RE | Admit: 2014-05-18 | Payer: Medicare Other | Source: Ambulatory Visit | Admitting: Neurosurgery

## 2014-05-18 ENCOUNTER — Encounter (HOSPITAL_COMMUNITY): Admission: RE | Payer: Self-pay | Source: Ambulatory Visit

## 2014-05-18 SURGERY — LUMBAR LAMINECTOMY/DECOMPRESSION MICRODISCECTOMY 2 LEVELS
Anesthesia: General | Site: Back | Laterality: Left

## 2014-06-08 ENCOUNTER — Encounter (HOSPITAL_COMMUNITY): Payer: Self-pay | Admitting: Pharmacy Technician

## 2014-06-09 ENCOUNTER — Other Ambulatory Visit (HOSPITAL_COMMUNITY): Payer: Self-pay | Admitting: *Deleted

## 2014-06-09 ENCOUNTER — Encounter (HOSPITAL_COMMUNITY): Payer: Self-pay

## 2014-06-09 ENCOUNTER — Ambulatory Visit (HOSPITAL_COMMUNITY)
Admission: RE | Admit: 2014-06-09 | Discharge: 2014-06-09 | Disposition: A | Discharge status: Home / Self Care | Payer: Medicare Other | Source: Ambulatory Visit | Attending: Orthopedic Surgery | Admitting: Orthopedic Surgery

## 2014-06-09 ENCOUNTER — Encounter (HOSPITAL_COMMUNITY)
Admission: RE | Admit: 2014-06-09 | Discharge: 2014-06-09 | Disposition: A | Discharge status: Home / Self Care | Payer: Medicare Other | Source: Ambulatory Visit | Attending: Surgery | Admitting: Surgery

## 2014-06-09 DIAGNOSIS — M48061 Spinal stenosis, lumbar region without neurogenic claudication: Secondary | ICD-10-CM | POA: Diagnosis not present

## 2014-06-09 DIAGNOSIS — Z01818 Encounter for other preprocedural examination: Secondary | ICD-10-CM | POA: Diagnosis not present

## 2014-06-09 DIAGNOSIS — M47814 Spondylosis without myelopathy or radiculopathy, thoracic region: Secondary | ICD-10-CM | POA: Insufficient documentation

## 2014-06-09 DIAGNOSIS — F17200 Nicotine dependence, unspecified, uncomplicated: Secondary | ICD-10-CM | POA: Insufficient documentation

## 2014-06-09 HISTORY — DX: Benign prostatic hyperplasia without lower urinary tract symptoms: N40.0

## 2014-06-09 HISTORY — DX: Cardiac murmur, unspecified: R01.1

## 2014-06-09 LAB — URINALYSIS, ROUTINE W REFLEX MICROSCOPIC
Bilirubin Urine: NEGATIVE
Glucose, UA: NEGATIVE mg/dL
Hgb urine dipstick: NEGATIVE
Ketones, ur: NEGATIVE mg/dL
Leukocytes, UA: NEGATIVE
Nitrite: NEGATIVE
Protein, ur: NEGATIVE mg/dL
Specific Gravity, Urine: 1.011 (ref 1.005–1.030)
Urobilinogen, UA: 0.2 mg/dL (ref 0.0–1.0)
pH: 6 (ref 5.0–8.0)

## 2014-06-09 LAB — COMPREHENSIVE METABOLIC PANEL
ALT: 18 U/L (ref 0–53)
AST: 20 U/L (ref 0–37)
Albumin: 3.4 g/dL — ABNORMAL LOW (ref 3.5–5.2)
Alkaline Phosphatase: 73 U/L (ref 39–117)
Anion gap: 12 (ref 5–15)
BUN: 14 mg/dL (ref 6–23)
CO2: 24 mEq/L (ref 19–32)
Calcium: 8.6 mg/dL (ref 8.4–10.5)
Chloride: 104 mEq/L (ref 96–112)
Creatinine, Ser: 0.98 mg/dL (ref 0.50–1.35)
GFR calc Af Amer: >90 mL/min (ref >90)
GFR calc non Af Amer: 84 mL/min — ABNORMAL LOW (ref >90)
Glucose, Bld: 144 mg/dL — ABNORMAL HIGH (ref 70–99)
Potassium: 4 mEq/L (ref 3.7–5.3)
Sodium: 140 mEq/L (ref 137–147)
Total Bilirubin: 0.7 mg/dL (ref 0.3–1.2)
Total Protein: 6.8 g/dL (ref 6.0–8.3)

## 2014-06-09 LAB — CBC
HCT: 42.7 % (ref 39.0–52.0)
Hemoglobin: 14.2 g/dL (ref 13.0–17.0)
MCH: 27.8 pg (ref 26.0–34.0)
MCHC: 33.3 g/dL (ref 30.0–36.0)
MCV: 83.7 fL (ref 78.0–100.0)
Platelets: 225 10*3/uL (ref 150–400)
RBC: 5.1 MIL/uL (ref 4.22–5.81)
RDW: 12.2 % (ref 11.5–15.5)
WBC: 6.4 10*3/uL (ref 4.0–10.5)

## 2014-06-09 LAB — SURGICAL PCR SCREEN
MRSA, PCR: NEGATIVE
Staphylococcus aureus: NEGATIVE

## 2014-06-09 LAB — PROTIME-INR
INR: 1.06 (ref 0.00–1.49)
Prothrombin Time: 13.8 seconds (ref 11.6–15.2)

## 2014-06-09 LAB — APTT: aPTT: 28 seconds (ref 24–37)

## 2014-06-09 NOTE — Progress Notes (Signed)
No pre-op orders in EPIC, called Dr. Rolena Infante' office and spoke with Anderson Malta. She states she will send Dr. Rolena Infante a message to put orders in.

## 2014-06-09 NOTE — Progress Notes (Signed)
Called Dr. Glo Herring office for a previous EKG, they have none on file.

## 2014-06-09 NOTE — H&P (Signed)
Gregory Diaz is an 63 y.o. male.    History of Present Illness (Robin C. Young; 06/09/2014 9:08 AM) The patient is a 63 year old male who comes in today for a preoperative history and physical. The patient is scheduled for a revision L4-S1 decompression to be performed by Dr. Duane Lope D. Rolena Infante, MD at [facility] on 06-18-14 . Symptoms changed from last visit.  Admitted to having some bloody stools last week which he thinks was from taking Mobic.  Has not discussed this with PCP.    Allergies Kendra Opitz Young; 06/09/2014 9:07 AM) Penicillins  Social History Kendra Opitz Young; 06/09/2014 9:07 AM) Marital status married Never consumed alcohol 04/20/2014: Never consumed alcohol Exercise Exercises never Exercises daily; does running / walking Living situation live with spouse Tobacco / smoke exposure 04/20/2014: no no Tobacco use Current every day smoker. 04/20/2014: smoke(d) 1 pack(s) per day current every day smoker; smoke(d) 1 pack(s) per day No history of drug/alcohol rehab Not under pain contract Drug/Alcohol Rehab (Previously) no Illicit drug use no Alcohol use never consumed alcohol Drug/Alcohol Rehab (Currently) no Children 3 Current work status retired working full time Number of flights of stairs before winded less than 1 Pain Contract yes  Medication History Kendra Opitz Young; 06/09/2014 9:12 AM) Hydrocodone-Acetaminophen (5-325MG  Tablet, Oral) Active. (Rx'd last year by Dr Nelva Bush) Lisinopril-Hydrochlorothiazide (20-12.5MG  Tablet, Oral) Active. (qd) Medications Reconciled  Past Surgical History Kendra Opitz Young; 06/09/2014 9:07 AM) Spinal Surgery Inguinal Hernia Repair laparoscopic: left  Other Problems Shirlean Mylar C Young; 06/09/2014 9:07 AM) High blood pressure  Vitals (Robin C. Young; 06/09/2014 9:11 AM) 06/09/2014 9:08 AM Weight: 301 lb Height: 74in Body Surface Area: 2.67 m Body Mass Index: 38.65 kg/m Temp.: 98.35F(Oral)  BP: 156/96 (Sitting,  Left Arm, Standard)                   Review of Systems  Constitutional: Negative.   HENT: Negative.   Eyes: Positive for redness.  Respiratory: Negative.   Gastrointestinal: Positive for blood in stool (states that he had this last week  denies current problem.).  Genitourinary: Negative.   Musculoskeletal: Positive for back pain.  Skin: Negative.   Neurological: Positive for tingling.  Psychiatric/Behavioral: Negative.     There were no vitals taken for this visit. Physical Exam  Constitutional: He is oriented to person, place, and time. He appears well-developed.  HENT:  Head: Normocephalic.  Eyes: EOM are normal. Pupils are equal, round, and reactive to light.  Neck: Normal range of motion.  Cardiovascular: Normal rate and normal heart sounds.   Respiratory: Effort normal and breath sounds normal.  GI: Soft. Bowel sounds are normal.  Musculoskeletal: Normal range of motion.  Neurological: He is alert and oriented to person, place, and time.  Skin: Skin is warm and dry.     Assessment/Plan L4-S1 stenosis.  Will proceed with revision decompression as scheduled.  Surgical procedure along with possible risks and complications discussed. All questions answered.    Caleb Decock M 06/09/2014, 9:49 AM

## 2014-06-09 NOTE — Pre-Procedure Instructions (Signed)
Gregory Diaz  06/09/2014   Your procedure is scheduled on:  Thursday, June 18, 2014 at 10:00 AM.   Report to [facility] Entrance "A" Admitting Office at 8:00 AM.   Call this number if you have problems the morning of surgery: [phone]   Remember:   Do not eat food or drink liquids after midnight Wednesday. 06/17/14.   Take these medicines the morning of surgery with A SIP OF WATER: HYDROcodone-acetaminophen (NORCO/VICODIN) - if needed.    Do not wear jewelry.  Do not wear lotions, powders, or cologne. You may wear deodorant.  Men may shave face and neck.  Do not bring valuables to the hospital.  [facility] is not responsible                  for any belongings or valuables.               Contacts, dentures or bridgework may not be worn into surgery.  Leave suitcase in the car. After surgery it may be brought to your room.  For patients admitted to the hospital, discharge time is determined by your                treatment team.    Special Instructions: [place] - Preparing for Surgery  Before surgery, you can play an important role.  Because skin is not sterile, your skin needs to be as free of germs as possible.  You can reduce the number of germs on you skin by washing with CHG (chlorahexidine gluconate) soap before surgery.  CHG is an antiseptic cleaner which kills germs and bonds with the skin to continue killing germs even after washing.  Please DO NOT use if you have an allergy to CHG or antibacterial soaps.  If your skin becomes reddened/irritated stop using the CHG and inform your nurse when you arrive at Short Stay.  Do not shave (including legs and underarms) for at least 48 hours prior to the first CHG shower.  You may shave your face.  Please follow these instructions carefully:   1.  Shower with CHG Soap the night before surgery and the                                morning of Surgery.  2.  If you choose to wash your hair, wash your hair first as  usual with your       normal shampoo.  3.  After you shampoo, rinse your hair and body thoroughly to remove the                      Shampoo.  4.  Use CHG as you would any other liquid soap.  You can apply chg directly       to the skin and wash gently with scrungie or a clean washcloth.  5.  Apply the CHG Soap to your body ONLY FROM THE NECK DOWN.        Do not use on open wounds or open sores.  Avoid contact with your eyes, ears, mouth and genitals (private parts).  Wash genitals (private parts) with your normal soap.  6.  Wash thoroughly, paying special attention to the area where your surgery        will be performed.  7.  Thoroughly rinse your body with warm water from the neck down.  8.  DO  NOT shower/wash with your normal soap after using and rinsing off       the CHG Soap.  9.  Pat yourself dry with a clean towel.            10.  Wear clean pajamas.            11.  Place clean sheets on your bed the night of your first shower and do not        sleep with pets.  Day of Surgery  Do not apply any lotions the morning of surgery.  Please wear clean clothes to the hospital/surgery center.     Please read over the following fact sheets that you were given: Pain Booklet, Coughing and Deep Breathing, MRSA Information and Surgical Site Infection Prevention

## 2014-06-09 NOTE — Progress Notes (Signed)
06/09/14 1125  OBSTRUCTIVE SLEEP APNEA  Have you ever been diagnosed with sleep apnea through a sleep study? No  Do you snore loudly (loud enough to be heard through closed doors)?  1  Do you often feel tired, fatigued, or sleepy during the daytime? 1  Has anyone observed you stop breathing during your sleep? 1  Do you have, or are you being treated for high blood pressure? 1  BMI more than 35 kg/m2? 1  Age over 65 years old? 1  Neck circumference greater than 40 cm/16 inches? 1 (19)  Gender: 1  Obstructive Sleep Apnea Score 8  Score 4 or greater  Results sent to PCP

## 2014-06-09 NOTE — Progress Notes (Signed)
Pt's PCP is Dr. Orson Ape in [place]. Pt denies any cardiac history, denies chest pain or sob.

## 2014-06-10 ENCOUNTER — Telehealth (HOSPITAL_COMMUNITY): Payer: Self-pay | Admitting: Vascular Surgery

## 2014-06-10 NOTE — Progress Notes (Signed)
Anesthesia Note: Patient was scheduled for revision of L4-S1 decompression on 06/18/14 by Dr. Rolena Infante. His EKG was left for me to review. His surgery has since been canceled for unknown reasons.  History includes HTN, BPH, murmur.  PCP is Dr. Orson Ape.    EKG on 06/09/14 showed SR with first degree AVB, LVH, ST/T wave abnormality, consider inferolateral ischemia. LVH and T wave abnormality are new when compared to 01/21/01 EKG.  Reviewed EKG with anesthesiologist Dr. Glennon Mac.  Since T wave abnormality is new, recommend preoperative cardiology evaluation priori to rescheduling.  Sherry at Dr. Rolena Infante office was notified.    George Hugh [facility] Short Stay Center/Anesthesiology Phone [phone] 06/10/2014 2:59 PM

## 2014-06-16 NOTE — H&P (Signed)
Agree with above 

## 2014-06-18 ENCOUNTER — Encounter (HOSPITAL_COMMUNITY): Admission: RE | Payer: Self-pay | Source: Ambulatory Visit

## 2014-06-18 ENCOUNTER — Inpatient Hospital Stay (HOSPITAL_COMMUNITY): Admission: RE | Admit: 2014-06-18 | Payer: Medicare HMO | Source: Ambulatory Visit | Admitting: Orthopedic Surgery

## 2014-06-18 SURGERY — LUMBAR LAMINECTOMY/DECOMPRESSION MICRODISCECTOMY
Anesthesia: General

## 2014-09-15 ENCOUNTER — Other Ambulatory Visit: Payer: Self-pay | Admitting: Neurosurgery

## 2014-09-15 DIAGNOSIS — M5416 Radiculopathy, lumbar region: Secondary | ICD-10-CM

## 2014-09-17 ENCOUNTER — Other Ambulatory Visit: Payer: Medicare HMO

## 2014-09-21 ENCOUNTER — Ambulatory Visit
Admission: RE | Admit: 2014-09-21 | Discharge: 2014-09-21 | Disposition: A | Discharge status: Home / Self Care | Payer: Medicare HMO | Source: Ambulatory Visit | Attending: Neurosurgery | Admitting: Neurosurgery

## 2014-09-21 DIAGNOSIS — M5416 Radiculopathy, lumbar region: Secondary | ICD-10-CM

## 2014-09-29 ENCOUNTER — Other Ambulatory Visit (HOSPITAL_COMMUNITY): Payer: Self-pay | Admitting: Neurosurgery

## 2014-10-06 NOTE — Pre-Procedure Instructions (Signed)
Gregory Diaz  10/06/2014   Your procedure is scheduled on: Monday, October 19, 2014  Report to [facility] Admitting at 8:00 AM.  Call this number if you have problems the morning of surgery: [phone]   Remember:   Do not eat food or drink liquids after midnight Sunday, October 18, 2014   Take these medicines the morning of surgery with A SIP OF WATER: if needed: HYDROcodone-acetaminophen (NORCO/VICODIN) for moderate pain.  Stop taking Aspirin, vitamins, and herbal medications. Do not take any NSAIDs ie: Ibuprofen, Advil, Naproxen or any medication containing Aspirin; stop 5 days prior to procedure ( Wednesday, October 14, 2014)   Do not wear jewelry, make-up or nail polish.  Do not wear lotions, powders, or perfumes. You may not  wear deodorant.  Do not shave 48 hours prior to surgery. Men may shave face and neck.  Do not bring valuables to the hospital.  [facility] is not responsible for any belongings or valuables.               Contacts, dentures or bridgework may not be worn into surgery.  Leave suitcase in the car. After surgery it may be brought to your room.  For patients admitted to the hospital, discharge time is determined by your treatment team.               Patients discharged the day of surgery will not be allowed to drive home.  Name and phone number of your driver:   Special Instructions:  Special Instructions:Special Instructions: [facility] - Preparing for Surgery  Before surgery, you can play an important role.  Because skin is not sterile, your skin needs to be as free of germs as possible.  You can reduce the number of germs on you skin by washing with CHG (chlorahexidine gluconate) soap before surgery.  CHG is an antiseptic cleaner which kills germs and bonds with the skin to continue killing germs even after washing.  Please DO NOT use if you have an allergy to CHG or antibacterial soaps.  If your skin becomes reddened/irritated stop using  the CHG and inform your nurse when you arrive at Short Stay.  Do not shave (including legs and underarms) for at least 48 hours prior to the first CHG shower.  You may shave your face.  Please follow these instructions carefully:   1.  Shower with CHG Soap the night before surgery and the morning of Surgery.  2.  If you choose to wash your hair, wash your hair first as usual with your normal shampoo.  3.  After you shampoo, rinse your hair and body thoroughly to remove the Shampoo.  4.  Use CHG as you would any other liquid soap.  You can apply chg directly  to the skin and wash gently with scrungie or a clean washcloth.  5.  Apply the CHG Soap to your body ONLY FROM THE NECK DOWN.  Do not use on open wounds or open sores.  Avoid contact with your eyes, ears, mouth and genitals (private parts).  Wash genitals (private parts) with your normal soap.  6.  Wash thoroughly, paying special attention to the area where your surgery will be performed.  7.  Thoroughly rinse your body with warm water from the neck down.  8.  DO NOT shower/wash with your normal soap after using and rinsing off the CHG Soap.  9.  Pat yourself dry with a clean towel.  10.  Wear clean pajamas.            11.  Place clean sheets on your bed the night of your first shower and do not sleep with pets.  Day of Surgery  Do not apply any lotions/deodorants the morning of surgery.  Please wear clean clothes to the hospital/surgery center.   Please read over the following fact sheets that you were given: Pain Booklet, Coughing and Deep Breathing, Blood Transfusion Information, MRSA Information and Surgical Site Infection Prevention

## 2014-10-07 ENCOUNTER — Encounter (HOSPITAL_COMMUNITY): Payer: Self-pay

## 2014-10-07 ENCOUNTER — Encounter (HOSPITAL_COMMUNITY)
Admission: RE | Admit: 2014-10-07 | Discharge: 2014-10-07 | Disposition: A | Discharge status: Home / Self Care | Payer: Medicare HMO | Source: Ambulatory Visit | Attending: Neurosurgery | Admitting: Neurosurgery

## 2014-10-07 DIAGNOSIS — I10 Essential (primary) hypertension: Secondary | ICD-10-CM | POA: Diagnosis not present

## 2014-10-07 DIAGNOSIS — R011 Cardiac murmur, unspecified: Secondary | ICD-10-CM | POA: Diagnosis not present

## 2014-10-07 DIAGNOSIS — H269 Unspecified cataract: Secondary | ICD-10-CM | POA: Insufficient documentation

## 2014-10-07 DIAGNOSIS — Z01818 Encounter for other preprocedural examination: Secondary | ICD-10-CM | POA: Diagnosis not present

## 2014-10-07 DIAGNOSIS — N400 Enlarged prostate without lower urinary tract symptoms: Secondary | ICD-10-CM | POA: Insufficient documentation

## 2014-10-07 DIAGNOSIS — Z720 Tobacco use: Secondary | ICD-10-CM | POA: Insufficient documentation

## 2014-10-07 DIAGNOSIS — F419 Anxiety disorder, unspecified: Secondary | ICD-10-CM | POA: Diagnosis not present

## 2014-10-07 HISTORY — DX: Unspecified cataract: H26.9

## 2014-10-07 HISTORY — DX: Anxiety disorder, unspecified: F41.9

## 2014-10-07 HISTORY — DX: Unspecified osteoarthritis, unspecified site: M19.90

## 2014-10-07 LAB — BASIC METABOLIC PANEL WITH GFR
Anion gap: 13 (ref 5–15)
BUN: 17 mg/dL (ref 6–23)
CO2: 22 meq/L (ref 19–32)
Calcium: 8.8 mg/dL (ref 8.4–10.5)
Chloride: 104 meq/L (ref 96–112)
Creatinine, Ser: 0.93 mg/dL (ref 0.50–1.35)
GFR calc Af Amer: >90 mL/min
GFR calc non Af Amer: 86 mL/min — ABNORMAL LOW
Glucose, Bld: 107 mg/dL — ABNORMAL HIGH (ref 70–99)
Potassium: 4.3 meq/L (ref 3.7–5.3)
Sodium: 139 meq/L (ref 137–147)

## 2014-10-07 LAB — TYPE AND SCREEN
ABO/RH(D): O POS
Antibody Screen: NEGATIVE

## 2014-10-07 LAB — CBC
HCT: 45.5 % (ref 39.0–52.0)
Hemoglobin: 15.4 g/dL (ref 13.0–17.0)
MCH: 28.4 pg (ref 26.0–34.0)
MCHC: 33.8 g/dL (ref 30.0–36.0)
MCV: 83.8 fL (ref 78.0–100.0)
Platelets: 236 K/uL (ref 150–400)
RBC: 5.43 MIL/uL (ref 4.22–5.81)
RDW: 12.4 % (ref 11.5–15.5)
WBC: 7.7 K/uL (ref 4.0–10.5)

## 2014-10-07 LAB — SURGICAL PCR SCREEN
MRSA, PCR: NEGATIVE
Staphylococcus aureus: NEGATIVE

## 2014-10-07 LAB — ABO/RH: ABO/RH(D): O POS

## 2014-10-07 NOTE — Progress Notes (Signed)
   10/07/14 0816  OBSTRUCTIVE SLEEP APNEA  Have you ever been diagnosed with sleep apnea through a sleep study? No  Do you snore loudly (loud enough to be heard through closed doors)?  0  Do you often feel tired, fatigued, or sleepy during the daytime? 0  Has anyone observed you stop breathing during your sleep? 0  Do you have, or are you being treated for high blood pressure? 1  BMI more than 35 kg/m2? 1  Age over 65 years old? 1  Neck circumference greater than 40 cm/16 inches? 1  Gender: 1  Obstructive Sleep Apnea Score 5

## 2014-10-07 NOTE — Progress Notes (Signed)
Pt denies SOB, chest pain, and being under the care of a cardiologist. Pt stated that he had a stress test done >10 years ago but denies having an echo and cardiac cath.

## 2014-10-12 NOTE — Progress Notes (Addendum)
Anesthesia Chart Review: Patient is a 63 year old male posted for L4-5, L5-S1 PLIF on 10/19/14 by Dr. Saintclair Halsted.  He was initially scheduled for revision L4-S1 decompression with Dr. Rolena Infante in 06/2014, but was canceled for unclear reasons and later was told he would need a preoperative cardiology evaluation due to abnormal EKG. He has since seen cardiologist Dr. Almira Coaster with [facility].  History includes smoking, HTN, BPH, murmur (diagnosed at age 1, no problems), BPH, anxiety, cataracts, back surgery, tonsillectomy, UHR. PCP is Dr. Orson Ape.   EKG on 06/09/14 showed SR with first degree AVB, LVH, ST/T wave abnormality, consider inferolateral ischemia. LVH and T wave abnormality are new when compared to 01/21/01 EKG.  CXR on 06/09/14: No active cardiopulmonary disease.  Preoperative labs noted.      Cardiology records are still pending, but according to notes in [place], was ultimately cleared for orthopedic surgery from a cardiac standpoint.  Will follow-up once cardiology records received.  George Hugh [facility] Short Stay Center/Anesthesiology Phone [phone] 10/12/2014 1:08 PM  Addendum: Records from [facility]) and Dr. Dion Body office received.    Nuclear stress test 06/25/14 [facility]): There was a small, fixed defect in the basal inferior, mid inferior segment(s). The extent of this defect was mild. Conclusion: Probably normal LV perfusion. Global left ventricular systolic function was  ,  with an EF of 46%. According to notes in [place], Dr. Hamilton Capri cleared patient for orthopedic back surgery with Dr. Rolena Infante at that time (although it's unclear if he ever underwent L4-S1 decompression revision).    It does not appear that he had an echo as part of his evaluation (no echo report with [facility] or at [facility]).  EKG on 06/22/14 ([facility]): SB at 56 bpm, diffuse T wave abnormality particularly in inferolateral leads--which appeared similar to  his 06/09/14 EKG done at [facility].    He was previously cleared for back surgery back in 06/2014.  He is now for PLIF.  If no acute changes then I anticipate that he can proceed as planned.  Anesthesiologist Dr. Linna Caprice agrees with this plan.  George Hugh [facility] Short Stay Center/Anesthesiology Phone [phone] 10/13/2014 5:14 PM

## 2014-10-19 ENCOUNTER — Inpatient Hospital Stay (HOSPITAL_COMMUNITY)
Admission: RE | Admit: 2014-10-19 | Discharge: 2014-10-22 | DRG: 460 | Disposition: A | Discharge status: Home / Self Care | Payer: Medicare HMO | Source: Ambulatory Visit | Attending: Neurosurgery | Admitting: Neurosurgery

## 2014-10-19 ENCOUNTER — Encounter (HOSPITAL_COMMUNITY)
Admission: RE | Disposition: A | Discharge status: Home / Self Care | Payer: Medicare HMO | Source: Ambulatory Visit | Attending: Neurosurgery

## 2014-10-19 ENCOUNTER — Inpatient Hospital Stay (HOSPITAL_COMMUNITY): Payer: Medicare HMO

## 2014-10-19 ENCOUNTER — Inpatient Hospital Stay (HOSPITAL_COMMUNITY): Payer: Medicare HMO | Admitting: Emergency Medicine

## 2014-10-19 ENCOUNTER — Encounter (HOSPITAL_COMMUNITY): Payer: Self-pay | Admitting: *Deleted

## 2014-10-19 ENCOUNTER — Inpatient Hospital Stay (HOSPITAL_COMMUNITY): Payer: Medicare HMO | Admitting: Certified Registered Nurse Anesthetist

## 2014-10-19 DIAGNOSIS — Z419 Encounter for procedure for purposes other than remedying health state, unspecified: Secondary | ICD-10-CM

## 2014-10-19 DIAGNOSIS — N400 Enlarged prostate without lower urinary tract symptoms: Secondary | ICD-10-CM | POA: Diagnosis present

## 2014-10-19 DIAGNOSIS — H269 Unspecified cataract: Secondary | ICD-10-CM | POA: Diagnosis present

## 2014-10-19 DIAGNOSIS — M47896 Other spondylosis, lumbar region: Secondary | ICD-10-CM | POA: Diagnosis present

## 2014-10-19 DIAGNOSIS — M5136 Other intervertebral disc degeneration, lumbar region: Secondary | ICD-10-CM | POA: Diagnosis present

## 2014-10-19 DIAGNOSIS — F419 Anxiety disorder, unspecified: Secondary | ICD-10-CM | POA: Diagnosis present

## 2014-10-19 DIAGNOSIS — F17210 Nicotine dependence, cigarettes, uncomplicated: Secondary | ICD-10-CM | POA: Diagnosis present

## 2014-10-19 DIAGNOSIS — M4806 Spinal stenosis, lumbar region: Principal | ICD-10-CM | POA: Diagnosis present

## 2014-10-19 DIAGNOSIS — M545 Low back pain: Secondary | ICD-10-CM | POA: Diagnosis present

## 2014-10-19 DIAGNOSIS — M1990 Unspecified osteoarthritis, unspecified site: Secondary | ICD-10-CM | POA: Diagnosis present

## 2014-10-19 DIAGNOSIS — Z79899 Other long term (current) drug therapy: Secondary | ICD-10-CM | POA: Diagnosis not present

## 2014-10-19 DIAGNOSIS — M5126 Other intervertebral disc displacement, lumbar region: Secondary | ICD-10-CM | POA: Diagnosis present

## 2014-10-19 DIAGNOSIS — Z880 Allergy status to penicillin: Secondary | ICD-10-CM

## 2014-10-19 DIAGNOSIS — I10 Essential (primary) hypertension: Secondary | ICD-10-CM | POA: Diagnosis present

## 2014-10-19 DIAGNOSIS — M48061 Spinal stenosis, lumbar region without neurogenic claudication: Secondary | ICD-10-CM | POA: Diagnosis present

## 2014-10-19 SURGERY — POSTERIOR LUMBAR FUSION 2 LEVEL
Anesthesia: General | Site: Back

## 2014-10-19 MED ORDER — FENTANYL CITRATE 0.05 MG/ML IJ SOLN
INTRAMUSCULAR | Status: AC
  Filled 2014-10-19: qty 5

## 2014-10-19 MED ORDER — CEFAZOLIN SODIUM 1-5 GM-% IV SOLN: 1.0000 g | Freq: Three times a day (TID) | INTRAVENOUS | Status: DC

## 2014-10-19 MED ORDER — FENTANYL CITRATE 0.05 MG/ML IJ SOLN
INTRAMUSCULAR | Status: DC | PRN
Start: 2014-10-19 — End: 2014-10-19
  Administered 2014-10-19: 100 ug via INTRAVENOUS
  Administered 2014-10-19 (×2): 50 ug via INTRAVENOUS
  Administered 2014-10-19: 150 ug via INTRAVENOUS
  Administered 2014-10-19 (×3): 50 ug via INTRAVENOUS

## 2014-10-19 MED ORDER — THROMBIN 20000 UNITS EX SOLR
CUTANEOUS | Status: DC | PRN
  Administered 2014-10-19 (×2): via TOPICAL

## 2014-10-19 MED ORDER — MIDAZOLAM HCL 2 MG/2ML IJ SOLN
INTRAMUSCULAR | Status: AC
  Filled 2014-10-19: qty 2

## 2014-10-19 MED ORDER — GLYCOPYRROLATE 0.2 MG/ML IJ SOLN
INTRAMUSCULAR | Status: AC
  Filled 2014-10-19: qty 3

## 2014-10-19 MED ORDER — OXYCODONE-ACETAMINOPHEN 5-325 MG PO TABS
1.0000 | ORAL_TABLET | ORAL | Status: DC | PRN
  Administered 2014-10-20 – 2014-10-22 (×11): 2 via ORAL
  Filled 2014-10-19 (×11): qty 2

## 2014-10-19 MED ORDER — PROPOFOL 10 MG/ML IV BOLUS
INTRAVENOUS | Status: AC
  Filled 2014-10-19: qty 20

## 2014-10-19 MED ORDER — LACTATED RINGERS IV SOLN
INTRAVENOUS | Status: DC
  Administered 2014-10-19: 08:00:00 via INTRAVENOUS

## 2014-10-19 MED ORDER — OXYCODONE HCL 5 MG PO TABS
5.0000 mg | ORAL_TABLET | Freq: Once | ORAL | Status: AC | PRN
  Administered 2014-10-19: 5 mg via ORAL

## 2014-10-19 MED ORDER — VANCOMYCIN HCL IN DEXTROSE 1-5 GM/200ML-% IV SOLN
INTRAVENOUS | Status: AC
  Administered 2014-10-19: 1000 mg via INTRAVENOUS
  Filled 2014-10-19: qty 200

## 2014-10-19 MED ORDER — GLYCOPYRROLATE 0.2 MG/ML IJ SOLN
INTRAMUSCULAR | Status: DC | PRN
  Administered 2014-10-19: 0.6 mg via INTRAVENOUS

## 2014-10-19 MED ORDER — OXYCODONE HCL 5 MG PO TABS
ORAL_TABLET | ORAL | Status: AC
  Filled 2014-10-19: qty 1

## 2014-10-19 MED ORDER — LISINOPRIL 20 MG PO TABS
20.0000 mg | ORAL_TABLET | Freq: Every day | ORAL | Status: DC
  Administered 2014-10-20 – 2014-10-22 (×3): 20 mg via ORAL
  Filled 2014-10-19 (×3): qty 1

## 2014-10-19 MED ORDER — ONDANSETRON HCL 4 MG/2ML IJ SOLN
INTRAMUSCULAR | Status: DC | PRN
  Administered 2014-10-19: 4 mg via INTRAVENOUS

## 2014-10-19 MED ORDER — ARTIFICIAL TEARS OP OINT
TOPICAL_OINTMENT | OPHTHALMIC | Status: AC
  Filled 2014-10-19: qty 3.5

## 2014-10-19 MED ORDER — ONDANSETRON HCL 4 MG/2ML IJ SOLN
4.0000 mg | INTRAMUSCULAR | Status: DC | PRN
Start: 2014-10-19 — End: 2014-10-22

## 2014-10-19 MED ORDER — LIDOCAINE-EPINEPHRINE 1 %-1:100000 IJ SOLN
INTRAMUSCULAR | Status: DC | PRN
  Administered 2014-10-19: 4.5 mL

## 2014-10-19 MED ORDER — LIDOCAINE HCL (CARDIAC) 20 MG/ML IV SOLN
INTRAVENOUS | Status: AC
  Filled 2014-10-19: qty 5

## 2014-10-19 MED ORDER — HYDROMORPHONE HCL 1 MG/ML IJ SOLN
INTRAMUSCULAR | Status: AC
  Administered 2014-10-19: 0.5 mg via INTRAVENOUS
  Filled 2014-10-19: qty 2

## 2014-10-19 MED ORDER — ROCURONIUM BROMIDE 50 MG/5ML IV SOLN
INTRAVENOUS | Status: AC
  Filled 2014-10-19: qty 1

## 2014-10-19 MED ORDER — EPHEDRINE SULFATE 50 MG/ML IJ SOLN
INTRAMUSCULAR | Status: DC | PRN
  Administered 2014-10-19: 15 mg via INTRAVENOUS
  Administered 2014-10-19 (×2): 10 mg via INTRAVENOUS
  Administered 2014-10-19: 15 mg via INTRAVENOUS

## 2014-10-19 MED ORDER — SODIUM CHLORIDE 0.9 % IJ SOLN
3.0000 mL | Freq: Two times a day (BID) | INTRAMUSCULAR | Status: DC
  Administered 2014-10-19 – 2014-10-22 (×5): 3 mL via INTRAVENOUS

## 2014-10-19 MED ORDER — ROCURONIUM BROMIDE 100 MG/10ML IV SOLN
INTRAVENOUS | Status: DC | PRN
  Administered 2014-10-19: 20 mg via INTRAVENOUS
  Administered 2014-10-19: 10 mg via INTRAVENOUS
  Administered 2014-10-19: 50 mg via INTRAVENOUS
  Administered 2014-10-19 (×4): 10 mg via INTRAVENOUS

## 2014-10-19 MED ORDER — 0.9 % SODIUM CHLORIDE (POUR BTL) OPTIME
TOPICAL | Status: DC | PRN
  Administered 2014-10-19: 1000 mL

## 2014-10-19 MED ORDER — ONDANSETRON HCL 4 MG/2ML IJ SOLN
INTRAMUSCULAR | Status: AC
  Filled 2014-10-19: qty 2

## 2014-10-19 MED ORDER — OXYCODONE HCL 5 MG/5ML PO SOLN: 5.0000 mg | Freq: Once | ORAL | Status: AC | PRN

## 2014-10-19 MED ORDER — LIDOCAINE HCL (CARDIAC) 20 MG/ML IV SOLN
INTRAVENOUS | Status: DC | PRN
  Administered 2014-10-19: 40 mg via INTRAVENOUS

## 2014-10-19 MED ORDER — MENTHOL 3 MG MT LOZG: 1.0000 | LOZENGE | OROMUCOSAL | Status: DC | PRN

## 2014-10-19 MED ORDER — VANCOMYCIN HCL IN DEXTROSE 1-5 GM/200ML-% IV SOLN
1000.0000 mg | Freq: Two times a day (BID) | INTRAVENOUS | Status: DC
  Administered 2014-10-19 – 2014-10-22 (×6): 1000 mg via INTRAVENOUS
  Filled 2014-10-19 (×7): qty 200

## 2014-10-19 MED ORDER — ALUM & MAG HYDROXIDE-SIMETH 200-200-20 MG/5ML PO SUSP
30.0000 mL | Freq: Four times a day (QID) | ORAL | Status: DC | PRN
  Administered 2014-10-21: 30 mL via ORAL
  Filled 2014-10-19: qty 30

## 2014-10-19 MED ORDER — MIDAZOLAM HCL 5 MG/5ML IJ SOLN
INTRAMUSCULAR | Status: DC | PRN
  Administered 2014-10-19: 2 mg via INTRAVENOUS

## 2014-10-19 MED ORDER — NEOSTIGMINE METHYLSULFATE 10 MG/10ML IV SOLN
INTRAVENOUS | Status: AC
  Filled 2014-10-19: qty 1

## 2014-10-19 MED ORDER — PHENOL 1.4 % MT LIQD: 1.0000 | OROMUCOSAL | Status: DC | PRN

## 2014-10-19 MED ORDER — SODIUM CHLORIDE 0.9 % IJ SOLN
3.0000 mL | INTRAMUSCULAR | Status: DC | PRN
  Administered 2014-10-21: 3 mL via INTRAVENOUS
  Filled 2014-10-19: qty 3

## 2014-10-19 MED ORDER — SODIUM CHLORIDE 0.9 % IR SOLN
Status: DC | PRN
  Administered 2014-10-19: 500 mL

## 2014-10-19 MED ORDER — HYDROCHLOROTHIAZIDE 12.5 MG PO CAPS
12.5000 mg | ORAL_CAPSULE | Freq: Every day | ORAL | Status: DC
  Administered 2014-10-20 – 2014-10-22 (×3): 12.5 mg via ORAL
  Filled 2014-10-19 (×3): qty 1

## 2014-10-19 MED ORDER — NEOSTIGMINE METHYLSULFATE 10 MG/10ML IV SOLN
INTRAVENOUS | Status: DC | PRN
  Administered 2014-10-19: 4 mg via INTRAVENOUS

## 2014-10-19 MED ORDER — HYDROMORPHONE HCL 1 MG/ML IJ SOLN: 0.5000 mg | INTRAMUSCULAR | Status: DC | PRN

## 2014-10-19 MED ORDER — ACETAMINOPHEN 650 MG RE SUPP: 650.0000 mg | RECTAL | Status: DC | PRN

## 2014-10-19 MED ORDER — BUPIVACAINE HCL (PF) 0.25 % IJ SOLN
INTRAMUSCULAR | Status: DC | PRN
  Administered 2014-10-19: 4.5 mL

## 2014-10-19 MED ORDER — HYDROMORPHONE HCL 1 MG/ML IJ SOLN
0.2500 mg | INTRAMUSCULAR | Status: DC | PRN
  Administered 2014-10-19 (×4): 0.5 mg via INTRAVENOUS

## 2014-10-19 MED ORDER — CYCLOBENZAPRINE HCL 10 MG PO TABS
10.0000 mg | ORAL_TABLET | Freq: Three times a day (TID) | ORAL | Status: DC | PRN
  Administered 2014-10-19 – 2014-10-20 (×3): 10 mg via ORAL
  Filled 2014-10-19 (×2): qty 1

## 2014-10-19 MED ORDER — PROPOFOL 10 MG/ML IV BOLUS
INTRAVENOUS | Status: DC | PRN
Start: 2014-10-19 — End: 2014-10-19
  Administered 2014-10-19: 120 mg via INTRAVENOUS

## 2014-10-19 MED ORDER — HYDROCODONE-ACETAMINOPHEN 5-325 MG PO TABS: 1.0000 | ORAL_TABLET | Freq: Two times a day (BID) | ORAL | Status: DC | PRN

## 2014-10-19 MED ORDER — DEXAMETHASONE SODIUM PHOSPHATE 4 MG/ML IJ SOLN
INTRAMUSCULAR | Status: AC
  Filled 2014-10-19: qty 2

## 2014-10-19 MED ORDER — CYCLOBENZAPRINE HCL 10 MG PO TABS
ORAL_TABLET | ORAL | Status: AC
  Filled 2014-10-19: qty 1

## 2014-10-19 MED ORDER — LACTATED RINGERS IV SOLN
INTRAVENOUS | Status: DC | PRN
  Administered 2014-10-19 (×3): via INTRAVENOUS

## 2014-10-19 MED ORDER — SODIUM CHLORIDE 0.9 % IV SOLN
250.0000 mL | INTRAVENOUS | Status: DC
Start: 2014-10-19 — End: 2014-10-22

## 2014-10-19 MED ORDER — LISINOPRIL-HYDROCHLOROTHIAZIDE 20-12.5 MG PO TABS: 1.0000 | ORAL_TABLET | Freq: Every day | ORAL | Status: DC

## 2014-10-19 MED ORDER — ACETAMINOPHEN 325 MG PO TABS: 650.0000 mg | ORAL_TABLET | ORAL | Status: DC | PRN

## 2014-10-19 MED FILL — Sodium Chloride IV Soln 0.9%: INTRAVENOUS | Qty: 1000 | Status: AC

## 2014-10-19 MED FILL — Heparin Sodium (Porcine) Inj 1000 Unit/ML: INTRAMUSCULAR | Qty: 30 | Status: AC

## 2014-10-19 SURGICAL SUPPLY — 70 items
ALLOSTEM STRIP 20MMX50MM (Tissue) ×2 IMPLANT
BAG DECANTER FOR FLEXI CONT (MISCELLANEOUS) ×2 IMPLANT
BENZOIN TINCTURE PRP APPL 2/3 (GAUZE/BANDAGES/DRESSINGS) ×2 IMPLANT
BIT DRILL 5.0/4.0 (BIT) ×1 IMPLANT
BLADE CLIPPER SURG (BLADE) IMPLANT
BLADE SURG 11 STRL SS (BLADE) ×2 IMPLANT
BONE ALLOSTEM MORSELIZED 5CC (Bone Implant) ×2 IMPLANT
BRUSH SCRUB EZ PLAIN DRY (MISCELLANEOUS) ×2 IMPLANT
BUR MATCHSTICK NEURO 3.0 LAGG (BURR) ×2 IMPLANT
BUR PRECISION FLUTE 6.0 (BURR) ×2 IMPLANT
CANISTER SUCT 3000ML (MISCELLANEOUS) ×2 IMPLANT
CAP LOCKING (Cap) ×6 IMPLANT
CAP LOCKING 5.5 CREO (Cap) ×6 IMPLANT
CONT SPEC 4OZ CLIKSEAL STRL BL (MISCELLANEOUS) ×4 IMPLANT
COVER BACK TABLE 60X90IN (DRAPES) ×2 IMPLANT
DECANTER SPIKE VIAL GLASS SM (MISCELLANEOUS) ×2 IMPLANT
DRAPE C-ARM 42X72 X-RAY (DRAPES) ×4 IMPLANT
DRAPE LAPAROTOMY 100X72X124 (DRAPES) ×2 IMPLANT
DRAPE POUCH INSTRU U-SHP 10X18 (DRAPES) ×2 IMPLANT
DRAPE PROXIMA HALF (DRAPES) IMPLANT
DRAPE SURG 17X23 STRL (DRAPES) ×2 IMPLANT
DRILL 5.0/4.0 (BIT) ×2
DRSG OPSITE 4X5.5 SM (GAUZE/BANDAGES/DRESSINGS) ×2 IMPLANT
DRSG OPSITE POSTOP 4X6 (GAUZE/BANDAGES/DRESSINGS) ×2 IMPLANT
DURAPREP 26ML APPLICATOR (WOUND CARE) ×2 IMPLANT
ELECT REM PT RETURN 9FT ADLT (ELECTROSURGICAL) ×2
ELECTRODE REM PT RTRN 9FT ADLT (ELECTROSURGICAL) ×1 IMPLANT
EVACUATOR 3/16  PVC DRAIN (DRAIN) ×1
EVACUATOR 3/16 PVC DRAIN (DRAIN) ×1 IMPLANT
GAUZE SPONGE 4X4 12PLY STRL (GAUZE/BANDAGES/DRESSINGS) ×2 IMPLANT
GAUZE SPONGE 4X4 16PLY XRAY LF (GAUZE/BANDAGES/DRESSINGS) ×2 IMPLANT
GLOVE BIO SURGEON STRL SZ8 (GLOVE) ×4 IMPLANT
GLOVE BIOGEL PI IND STRL 8 (GLOVE) ×4 IMPLANT
GLOVE BIOGEL PI INDICATOR 8 (GLOVE) ×4
GLOVE ECLIPSE 7.5 STRL STRAW (GLOVE) ×12 IMPLANT
GLOVE EXAM NITRILE LRG STRL (GLOVE) IMPLANT
GLOVE EXAM NITRILE MD LF STRL (GLOVE) IMPLANT
GLOVE EXAM NITRILE XL STR (GLOVE) IMPLANT
GLOVE EXAM NITRILE XS STR PU (GLOVE) IMPLANT
GLOVE INDICATOR 8.5 STRL (GLOVE) ×4 IMPLANT
GOWN STRL REUS W/ TWL LRG LVL3 (GOWN DISPOSABLE) IMPLANT
GOWN STRL REUS W/ TWL XL LVL3 (GOWN DISPOSABLE) ×3 IMPLANT
GOWN STRL REUS W/TWL 2XL LVL3 (GOWN DISPOSABLE) ×4 IMPLANT
GOWN STRL REUS W/TWL LRG LVL3 (GOWN DISPOSABLE)
GOWN STRL REUS W/TWL XL LVL3 (GOWN DISPOSABLE) ×3
KIT BASIN OR (CUSTOM PROCEDURE TRAY) ×2 IMPLANT
KIT ROOM TURNOVER OR (KITS) ×2 IMPLANT
LIQUID BAND (GAUZE/BANDAGES/DRESSINGS) ×2 IMPLANT
MILL MEDIUM DISP (BLADE) ×2 IMPLANT
NEEDLE HYPO 25X1 1.5 SAFETY (NEEDLE) ×2 IMPLANT
NS IRRIG 1000ML POUR BTL (IV SOLUTION) ×2 IMPLANT
PACK LAMINECTOMY NEURO (CUSTOM PROCEDURE TRAY) ×2 IMPLANT
PAD ARMBOARD 7.5X6 YLW CONV (MISCELLANEOUS) ×6 IMPLANT
ROD 65MM SPINAL (Rod) ×2 IMPLANT
ROD SPNL 5.5 CREO TI 65 (Rod) ×2 IMPLANT
SCREW CREO SHAFT 6.5/5.0X40MM (Screw) ×4 IMPLANT
SCREW PREASSEMBLY 6.0-5.0X35 (Screw) ×8 IMPLANT
SPONGE LAP 4X18 X RAY DECT (DISPOSABLE) IMPLANT
SPONGE SURGIFOAM ABS GEL 100 (HEMOSTASIS) ×4 IMPLANT
STRIP CLOSURE SKIN 1/2X4 (GAUZE/BANDAGES/DRESSINGS) ×2 IMPLANT
SUT VIC AB 0 CT1 18XCR BRD8 (SUTURE) ×2 IMPLANT
SUT VIC AB 0 CT1 8-18 (SUTURE) ×2
SUT VIC AB 2-0 CT1 18 (SUTURE) ×4 IMPLANT
SUT VICRYL 4-0 PS2 18IN ABS (SUTURE) ×2 IMPLANT
SYR 20ML ECCENTRIC (SYRINGE) ×2 IMPLANT
TOWEL OR 17X24 6PK STRL BLUE (TOWEL DISPOSABLE) ×2 IMPLANT
TOWEL OR 17X26 10 PK STRL BLUE (TOWEL DISPOSABLE) ×2 IMPLANT
TRAY FOLEY CATH 16FRSI W/METER (SET/KITS/TRAYS/PACK) ×2 IMPLANT
TULIP CREP AMP 5.5MM (Orthopedic Implant) ×4 IMPLANT
WATER STERILE IRR 1000ML POUR (IV SOLUTION) ×2 IMPLANT

## 2014-10-19 NOTE — Anesthesia Preprocedure Evaluation (Signed)
Anesthesia Evaluation  Patient identified by MRN, date of birth, ID band Patient awake    Reviewed: Allergy & Precautions, H&P , NPO status , Patient's Chart, lab work & pertinent test results  Airway Mallampati: III  TM Distance: >3 FB Neck ROM: Full    Dental no notable dental hx. (+) Teeth Intact, Dental Advisory Given   Pulmonary Current Smoker,  breath sounds clear to auscultation  Pulmonary exam normal       Cardiovascular hypertension, Pt. on medications Rhythm:Regular Rate:Normal     Neuro/Psych Anxiety negative neurological ROS  negative psych ROS   GI/Hepatic negative GI ROS, Neg liver ROS,   Endo/Other  negative endocrine ROS  Renal/GU negative Renal ROS  negative genitourinary   Musculoskeletal   Abdominal   Peds  Hematology negative hematology ROS (+)   Anesthesia Other Findings   Reproductive/Obstetrics negative OB ROS                             Anesthesia Physical Anesthesia Plan  ASA: II  Anesthesia Plan: General   Post-op Pain Management:    Induction: Intravenous  Airway Management Planned: Oral ETT  Additional Equipment:   Intra-op Plan:   Post-operative Plan: Extubation in OR  Informed Consent: I have reviewed the patients History and Physical, chart, labs and discussed the procedure including the risks, benefits and alternatives for the proposed anesthesia with the patient or authorized representative who has indicated his/her understanding and acceptance.   Dental advisory given  Plan Discussed with: CRNA  Anesthesia Plan Comments:         Anesthesia Quick Evaluation

## 2014-10-19 NOTE — Progress Notes (Signed)
Pt received at this time via bed from PACU alert and awake. IV to right hand saline locked. Surgical dressing dry and intact. Hemovac in place. Foley in place.  Assisted with repositioning. Safety measures in place. Pt educated and oriented to room. Call bell within reach. No noted distress. Will continue to monitor.

## 2014-10-19 NOTE — Transfer of Care (Signed)
Immediate Anesthesia Transfer of Care Note  Patient: Gregory Diaz  Procedure(s) Performed: Procedure(s) with comments: Lumbar four-five, Lumbar five-Sacral one posterior lumbar fusion with interbody prosthesis posterior lateral arthrodesis and posterior segmental instrumentation (N/A) - Lumbar four-five, Lumbar five-Sacral one posterior lumbar fusion with interbody prosthesis posterior lateral arthrodesis and posterior segmental instrumentation  Patient Location: PACU  Anesthesia Type:General  Level of Consciousness: sedated  Airway & Oxygen Therapy: Patient Spontanous Breathing and Patient connected to nasal cannula oxygen  Post-op Assessment: Report given to PACU RN and Post -op Vital signs reviewed and stable  Post vital signs: Reviewed and stable  Complications: No apparent anesthesia complications

## 2014-10-19 NOTE — Op Note (Signed)
Reoperative diagnosis: Lumbar spondylosis and stenosis recurrent disc herniation and severe degenerative disc disease L4-5 L5-S1  Postoperative diagnosis: Same  Procedure: #1 redo decompressive lumbar laminectomy L4-5 in excess and requiring more work to would be needed with a standard interbody fusion and decompressive lumbar limiting L5-S1 and excess and requiring more work to would be needed with a standard interbody fusion  #2 posterior lumbar interbody fusion using globus expandable peek cages packed with locally harvested autograft mixed with allostem  #3 cortical screw fixation using the globusCreo cortical screw system from L4-S1  #4 placement of a large Hemovac drain  Surgeon: Dominica Severin Libni Fusaro  Asst.: Jovita Gamma  EBL: Minimal 300 mL  Anesthesia: Gen.  History of present illness: Patient is very pleasant 63 year old woman whose had progressive worsening back and bilateral leg pain worse on the left ring done L4 and L5 nerve root pattern. Workup revealed severe degenerative disc disease lumbar spinal stenosis and severe foraminal stenosis of the L4-L5 and L5 nerve roots due to marked facet arthropathy and recurrent disc herniation on the left at L4-5 and severe degenerative disc disease. Due to patient's failure conservative treatment imaging findings and progression of clinical syndrome I recommended decompression stabilization procedure at L4-5 and L5-S1. I extensively went over the risks and benefits of the operation with the patient as well as perioperative course expectations of outcome and alternatives to surgery and he understood and agreed to proceed forward.  Operative procedure: Patient brought into the or was induced under general anesthesia positioned prone the Wilson frame his back was prepped and draped in routine sterile fashion is old incision was identified and extended cephalad caudally the scar tissue and subperiosteal dissection carried out on the lamina of L4-L5 and S1  bilaterally then using AP fluoroscopy pilot holes were drilled 5 and 7:00 position of the L4 pedicles and then using a 40 drill bit holes were drilled extremity directing superolaterally in the L4 using fluoroscopy. These were then tapped with a 5 tap and 60 by 50 cortical screws were placed at L4. Then testing the decompression spinous processes at L4 and L5 were removed central decompression was begun there was marked facet arthropathy and severe hourglass compression of thecal sac there was extensive amount of epidural fibrosis on the left at L4-5*first decompressed the right side did radical medial facetectomies and radical foraminotomies decompressing the 45 in 1 redo in the right the margin of the left work to the scar tissue freed up the scar tissue to complete facetectomies that side as well unroofing the L4-L5 and S1 nerve roots over there. After all foraminotomies were performed and taken the interbody work first working at L4-5 disc spaces cleanout bilaterally size 8 distractor was inserted on the right size 9 left and a working with a 9 distractor in place the spaces cleanout endplates were prepared an 8 mm x 26 mm expandable cage was then inserted on the patient's right after being packed with local autograft andallostem. Then distractor was removed fluoroscopy used the step along the way to confirm good position the disc space at L5-S1 was then cleaned out with a 9 distractor the patient's left side the right side was cleanout a similar fashion I inserted 22 mm 8 mm graft with both the 10 lordosis. These were expanded 2-3 turns a piece. Then the right side and distractors removed right-sided disc space were prepared central disc was also removed and central endplates were prepared Allostem sponge was then packed centrally and the local autograft  And Alllostem morsels were then packed centrally as well the contralateral cages in place then attention second placement last 2 sets of cortical screws  using fluoroscopy directing medial to lateral straighten the pedicle screws are placed at L5 and S1 35 mm screws all screws excellent purchase then the heads were assembled on 9 the screws were advanced slightly AP and lateral fluoroscopy confirmed good position of all the implants then a 65 mm lordosis rod was placed the nuts were applied and torqued down and anchored in place. All the foraminal reinspected confirm patency no migration of graft material Gelfoam was laid up the dura large abductor was placed the wounds closed in layers with interrupted Vicryl the skin was closed running 4 subcuticular benzoin Steri-Strips Dermabond and the wounds dressed and patient recovered in stable condition. At the end the case all needle counts and sponge counts were correct.

## 2014-10-19 NOTE — Anesthesia Postprocedure Evaluation (Signed)
  Anesthesia Post-op Note  Patient: Gregory Diaz  Procedure(s) Performed: Procedure(s) with comments: Lumbar four-five, Lumbar five-Sacral one posterior lumbar fusion with interbody prosthesis posterior lateral arthrodesis and posterior segmental instrumentation (N/A) - Lumbar four-five, Lumbar five-Sacral one posterior lumbar fusion with interbody prosthesis posterior lateral arthrodesis and posterior segmental instrumentation  Patient Location: PACU  Anesthesia Type:General  Level of Consciousness: awake and alert   Airway and Oxygen Therapy: Patient Spontanous Breathing  Post-op Pain: moderate  Post-op Assessment: Post-op Vital signs reviewed, Patient's Cardiovascular Status Stable and Respiratory Function Stable  Post-op Vital Signs: Reviewed  Filed Vitals:   10/19/14 1535  BP: 118/70  Pulse: 70  Temp:   Resp: 12    Complications: No apparent anesthesia complications

## 2014-10-19 NOTE — Anesthesia Procedure Notes (Signed)
Procedure Name: Intubation Date/Time: 10/19/2014 10:03 AM Performed by: Trixie Deis A Pre-anesthesia Checklist: Patient identified, Timeout performed, Emergency Drugs available, Suction available and Patient being monitored Patient Re-evaluated:Patient Re-evaluated prior to inductionOxygen Delivery Method: Circle system utilized Preoxygenation: Pre-oxygenation with 100% oxygen Intubation Type: IV induction Ventilation: Mask ventilation without difficulty and Oral airway inserted - appropriate to patient size Laryngoscope Size: Mac and 4 Grade View: Grade II Tube type: Oral Tube size: 7.5 mm Number of attempts: 1 Airway Equipment and Method: Stylet Placement Confirmation: ETT inserted through vocal cords under direct vision,  breath sounds checked- equal and bilateral and positive ETCO2 Secured at: 23 cm Tube secured with: Tape Dental Injury: Teeth and Oropharynx as per pre-operative assessment

## 2014-10-19 NOTE — H&P (Signed)
Gregory Diaz is an 63 y.o. male.   Chief Complaint: Back and left leg pain HPI: Patient is very pleasant ?32 M is a progress worsening back and bilateral leg pain worse on the left ray down at L4 and L5 nerve root pattern. Workup revealed severe bar spondylosis and stenosis with severe compression of both L4-L5 and S1 nerve roots and do this mixture treatment imaging findings progression of clinical syndrome and imaging findings have recommended decompression stabilization procedure at L4-5 and L5-S1. I have extensively reviewed the risks and benefits of the operation with the patient as well as perioperative course expectations of outcome and alternatives of surgery and he understands and agrees to proceed forward.  Past Medical History  Diagnosis Date  . Hypertension   . Heart murmur     was told at age 1, but has not had any problems  . Enlarged prostate   . Anxiety   . Arthritis   . Early cataracts, bilateral     Past Surgical History  Procedure Laterality Date  . Hemorroidectomy    . Back surgery    . Fracture surgery Right     thumb  . Colonoscopy    . Hernia repair      umbilical hernia repair  . Tonsillectomy      Family History  Problem Relation Age of Onset  . Gallstones Father   . Ulcers Father    Social History:  reports that he has been smoking Cigarettes.  He has been smoking about 1.00 pack per day. He has never used smokeless tobacco. He reports that he does not drink alcohol or use illicit drugs.  Allergies:  Allergies  Allergen Reactions  . Penicillins Swelling         Medications Prior to Admission  Medication Sig Dispense Refill  . HYDROcodone-acetaminophen (NORCO/VICODIN) 5-325 MG per tablet Take 1 tablet by mouth 2 (two) times daily as needed for moderate pain.    Marland Kitchen lisinopril-hydrochlorothiazide (PRINZIDE,ZESTORETIC) 20-12.5 MG per tablet Take 1 tablet by mouth daily.      No results found for this or any previous visit (from the past 48  hour(s)). No results found.  Review of Systems  Constitutional: Negative.   Eyes: Negative.   Respiratory: Negative.   Cardiovascular: Negative.   Gastrointestinal: Negative.   Genitourinary: Negative.   Musculoskeletal: Positive for back pain and joint pain.  Neurological: Positive for tingling and sensory change.  Endo/Heme/Allergies: Negative.   Psychiatric/Behavioral: Negative.     Blood pressure 127/71, pulse 56, temperature 97.8 F (36.6 C), temperature source Oral, height 6\' 2"  (1.88 m), weight 135.626 kg (299 lb), SpO2 98 %. Physical Exam  Constitutional: He is oriented to person, place, and time. He appears well-developed and well-nourished.  HENT:  Head: Normocephalic.  Eyes: Pupils are equal, round, and reactive to light.  Neck: Normal range of motion.  Cardiovascular: Normal rate.   Respiratory: Effort normal.  GI: Soft. Bowel sounds are normal.  Musculoskeletal: Normal range of motion.  Neurological: He is alert and oriented to person, place, and time. He has normal strength. GCS eye subscore is 4. GCS verbal subscore is 5. GCS motor subscore is 6.  Reflex Scores:      Patellar reflexes are 0 on the right side and 0 on the left side.      Achilles reflexes are 0 on the right side and 0 on the left side. Strength is 5 out of 5 in his iliopsoas, quads, hip she's, gastrocs, and  tibialis, and EHL. He does have some slight weakness in his left quadricep but still grade it to 5.  Skin: Skin is warm and dry.     Assessment/Plan 63 year old presents for decompression stabilization procedure at L4-5 L5-S1.  Gregory Diaz P 10/19/2014, 9:30 AM

## 2014-10-20 MED ORDER — MANAGING BACK PAIN BOOK
Freq: Once | Status: AC
  Administered 2014-10-20: 06:00:00
  Filled 2014-10-20: qty 1

## 2014-10-20 NOTE — Progress Notes (Signed)
CARE MANAGEMENT NOTE 10/20/2014  Patient:  Gregory Diaz, Gregory Diaz   Account Number:  [id number]  Date Initiated:  10/20/2014  Documentation initiated by:  Olga Coaster  Subjective/Objective Assessment:   ADMITTED FOR SURGERY     Action/Plan:   CM FOLLOWING FOR DCP   Anticipated DC Date:  10/24/2014   Anticipated DC Plan:  AWAITING ON PT/OT EVALS FOR DISPOSITION NEEDS WITH ATTENDING MD IN AGREEMENT     DC Planning Services  CM consult          Status of service:  In process, will continue to follow Medicare Important Message given?   (If response is "NO", the following Medicare IM given date fields will be blank)  Per UR Regulation:  Reviewed for med. necessity/level of care/duration of stay  Comments:  12/8/2015Mindi Slicker RN,BSN,MHA [phone]

## 2014-10-20 NOTE — Evaluation (Signed)
Physical Therapy Evaluation Patient Details Name: Gregory Diaz MRN: [id number] DOB: [birth year] Today's Date: 10/20/2014   History of Present Illness  63 yo adm 10/19/14 for redo laminectomy L4-5 and fusion L4-S1. PMHx- anxiety, OA, HTN, back surgery  Clinical Impression  Patient is s/p above surgery resulting in the deficits listed below (see PT Problem List). Pt required frequent cues to maintain back precautions. Patient will benefit from skilled PT to increase their independence and safety with mobility (while adhering to their precautions) to allow discharge home.      Follow Up Recommendations No PT follow up;Supervision - Intermittent    Equipment Recommendations  None recommended by PT    Recommendations for Other Services       Precautions / Restrictions Precautions Precautions: Back Precaution Booklet Issued: Yes (comment) Precaution Comments: Pt verbalized 3/3 precautions however needed frequent cues to avoid twisting. Required Braces or Orthoses: Spinal Brace Spinal Brace: Lumbar corset;Applied in sitting position Restrictions Weight Bearing Restrictions: No      Mobility  Bed Mobility Overal bed mobility: Needs Assistance Bed Mobility: Rolling;Sidelying to Sit Rolling: Min assist Sidelying to sit: Min assist     Sit to sidelying: Min guard General bed mobility comments: HOB flat; use of bedrails. Verbal cues  and assist for log roll sequence and hand placement. Min (A) to support trunk to come to sitting  Transfers Overall transfer level: Needs assistance Equipment used: Rolling walker (2 wheeled) Transfers: Sit to/from Stand Sit to Stand: Min guard         General transfer comment: x2; vc for safe use of RW  Ambulation/Gait Ambulation/Gait assistance: Min guard Ambulation Distance (Feet): 50 Feet (seated rest, 50 ) Assistive device: Rolling walker (2 wheeled) Gait Pattern/deviations: Step-through pattern;Decreased stride length;Trunk flexed    Gait velocity interpretation: Below normal speed for age/gender General Gait Details: vc throughout for upright posture and to avoid twisting to look for items in room  Stairs            Wheelchair Mobility    Modified Rankin (Stroke Patients Only)       Balance Overall balance assessment: Needs assistance Sitting-balance support: No upper extremity supported;Feet supported Sitting balance-Leahy Scale: Fair     Standing balance support: Bilateral upper extremity supported;During functional activity Standing balance-Leahy Scale: Poor Standing balance comment: LOB while backing up to toilet and leaving RW out of full reach. Min (A) to regain balance and directional cues for safe RW use                             Pertinent Vitals/Pain SaO2 98% on room air throughout session  Pain Assessment: 0-10 Pain Score: 7  Pain Location: back Pain Descriptors / Indicators: Sore Pain Intervention(s): Limited activity within patient's tolerance;Monitored during session;Repositioned;Patient requesting pain meds-RN notified    Home Living Family/patient expects to be discharged to:: Private residence Living Arrangements: Spouse/significant other Available Help at Discharge: Family;Available PRN/intermittently Type of Home: House Home Access: Stairs to enter Entrance Stairs-Rails: None Entrance Stairs-Number of Steps: 1 Home Layout: One level Home Equipment: Walker - 2 wheels;Cane - single point;Crutches;Shower seat;Hand held shower head Additional Comments: Wife works part time. Daughter and son can check on pt intermittently    Prior Function Level of Independence: Independent         Comments: Drives and would like to go back to work. Pt was a former truck Tree surgeon  Dominant Hand: Right    Extremity/Trunk Assessment   Upper Extremity Assessment: Overall WFL for tasks assessed           Lower Extremity Assessment: Overall WFL for  tasks assessed (reports less pain in Lt foot than PTA)      Cervical / Trunk Assessment: Normal  Communication   Communication: No difficulties  Cognition Arousal/Alertness: Awake/alert Behavior During Therapy: WFL for tasks assessed/performed Overall Cognitive Status: Within Functional Limits for tasks assessed       Memory: Decreased recall of precautions              General Comments General comments (skin integrity, edema, etc.): Surgical dressing clean and dry. Hemovac intact. Pt on O2 per MD due to being a smoker. On room air for 15 minutes, pt's O2 level was 96-97%.    Exercises        Assessment/Plan    PT Assessment Patient needs continued PT services  PT Diagnosis Difficulty walking;Acute pain   PT Problem List Decreased activity tolerance;Decreased mobility;Decreased knowledge of use of DME;Decreased knowledge of precautions;Pain  PT Treatment Interventions DME instruction;Gait training;Stair training;Functional mobility training;Therapeutic activities;Patient/family education   PT Goals (Current goals can be found in the Care Plan section) Acute Rehab PT Goals Patient Stated Goal: to be able to work again PT Goal Formulation: With patient Time For Goal Achievement: 10/23/14 Potential to Achieve Goals: Good    Frequency Min 5X/week   Barriers to discharge Decreased caregiver support      Co-evaluation               End of Session Equipment Utilized During Treatment: Gait belt;Back brace Activity Tolerance: Patient tolerated treatment well Patient left: in chair;with call bell/phone within reach;with chair alarm set Nurse Communication: Mobility status         Time: 1055-1130 PT Time Calculation (min) (ACUTE ONLY): 35 min   Charges:   PT Evaluation $Initial PT Evaluation Tier I: 1 Procedure PT Treatments $Gait Training: 23-37 mins   PT G Codes:          Tasnim Balentine 2014-11-03, 11:44 AM Pager [phone]

## 2014-10-20 NOTE — Progress Notes (Signed)
Occupational Therapy Evaluation Patient Details Name: Gregory Diaz MRN: [id number] DOB: 05/10/1951 Today's Date: 10/20/2014    History of Present Illness 63 yo adm 10/19/14 for redo laminectomy L4-5 and fusion L4-S1. PMHx- anxiety, OA, HTN, back surgery   Clinical Impression   Patient is s/p above surgery resulting in functional limitations due to the deficits listed below (see OT problem list). Patient is very talkative and nervous about injuring/disturbing the surgical site. Patient required verbal cues throughout session to adhere to back precautions, for safe hand placement on surfaces, and for safe RW management. Patient completed functional mobility and transfers at a min guard assist level. Patient will benefit from skilled OT acutely to increase independence and safety with ADLS to allow discharge home with [facility] services due to decreased caregiver support during the day.    Follow Up Recommendations  Home health OT;Supervision/Assistance - 24 hour    Equipment Recommendations  3 in 1 bedside comode    Recommendations for Other Services       Precautions / Restrictions Precautions Precautions: Back Precaution Booklet Issued: Yes (comment) Precaution Comments: Pt verbalized 1/3 back precautions at beginning of session. Reviewed precautions during functional activities. Pt able to verbalize 3/3 precautions at end of session. Required Braces or Orthoses: Spinal Brace Spinal Brace: Lumbar corset;Applied in sitting position Restrictions Weight Bearing Restrictions: No      Mobility Bed Mobility Overal bed mobility: Needs Assistance Bed Mobility: Rolling;Sidelying to Sit;Sit to Sidelying Rolling: Min guard Sidelying to sit: Min assist     Sit to sidelying: Min guard General bed mobility comments: HOB flat; use of bedrails. Verbal cues for log roll sequence and hand placement. Min (A) to support trunk to come to sitting  Transfers Overall transfer level: Needs  assistance Equipment used: Rolling walker (2 wheeled) Transfers: Sit to/from Stand Sit to Stand: Min guard         General transfer comment: Min guard (A) for safety/balance. Verbal cues for safe hand placement.     Balance Overall balance assessment: Needs assistance Sitting-balance support: No upper extremity supported;Feet supported Sitting balance-Leahy Scale: Fair     Standing balance support: Bilateral upper extremity supported;During functional activity Standing balance-Leahy Scale: Poor Standing balance comment: LOB while backing up to toilet and leaving RW out of full reach. Min (A) to regain balance and directional cues for safe RW use                            ADL Overall ADL's : Needs assistance/impaired     Grooming: Wash/dry hands;Wash/dry face;Oral care;Min guard;Cueing for safety;Cueing for compensatory techniques;Standing Grooming Details (indicate cue type and reason): Verbal cues to avoid bending over sink and for safe RW use         Upper Body Dressing : Minimal assistance;Cueing for sequencing;Sitting Upper Body Dressing Details (indicate cue type and reason): Min (A) and directional verbal cues to don/doff brace.     Toilet Transfer: Minimal assistance;Cueing for safety;Cueing for sequencing;Ambulation;BSC;RW;Grab bars Toilet Transfer Details (indicate cue type and reason): Pt with LOB while backing up to toilet, min (A) to regain balance. Verbal cues to avoid looking back to see toilet (twisting). Directional cues to back up to toilet, feel it with back of legs, one hand on RW and one hand on BSC before descending. Toileting- Water quality scientist and Hygiene: Min guard;Sit to/from stand;Cueing for back precautions Toileting - Clothing Manipulation Details (indicate cue type and reason): Verbal cues to  avoid bending     Functional mobility during ADLs: Min guard;Cueing for safety;Rolling walker General ADL Comments: Pt is very talkative and  a bit anxious about injuring himself. Pt on O2, btu on room air for 15 minutes O2 levels stayed at 96-97%. Pt will need reinforcement of back precautions during functional activities. Educated pt on compensatory techniques for oral care and to keep items on R side to avoid twisting. Pt educated on safe hand placement on surfaces (RW, bed, BSC) and safe RW use.     Vision                     Perception     Praxis      Pertinent Vitals/Pain Pain Assessment: 0-10 Pain Score:  (Pt states "It's too difficult to put a number to") Pain Location: back Pain Descriptors / Indicators: Sore Pain Intervention(s): Monitored during session;Repositioned     Hand Dominance Right   Extremity/Trunk Assessment Upper Extremity Assessment Upper Extremity Assessment: Overall WFL for tasks assessed   Lower Extremity Assessment Lower Extremity Assessment: Defer to PT evaluation   Cervical / Trunk Assessment Cervical / Trunk Assessment: Normal   Communication Communication Communication: No difficulties   Cognition Arousal/Alertness: Awake/alert Behavior During Therapy: WFL for tasks assessed/performed Overall Cognitive Status: Within Functional Limits for tasks assessed       Memory: Decreased recall of precautions             General Comments       Exercises       Shoulder Instructions      Home Living Family/patient expects to be discharged to:: Private residence Living Arrangements: Spouse/significant other Available Help at Discharge: Family;Available PRN/intermittently Type of Home: House Home Access: Stairs to enter [employer] of Steps: 1 (front) 14 (back) Entrance Stairs-Rails: Right;Left;Can reach both (Back stairs) Home Layout: One level         Bathroom Toilet: Handicapped height     Home Equipment: Walker - 2 wheels;Cane - single point;Crutches;Shower seat;Hand held shower head   Additional Comments: Wife works part time. Daughter and son  can check on pt intermittently      Prior Functioning/Environment Level of Independence: Independent        Comments: Drives and would like to go back to work. Pt was a former Administrator    OT Diagnosis: Generalized weakness;Acute pain   OT Problem List: Decreased strength;Decreased range of motion;Decreased activity tolerance;Impaired balance (sitting and/or standing);Decreased coordination;Decreased safety awareness;Decreased knowledge of use of DME or AE;Decreased knowledge of precautions;Obesity;Pain   OT Treatment/Interventions: Self-care/ADL training;Therapeutic exercise;Energy conservation;DME and/or AE instruction;Therapeutic activities;Patient/family education;Balance training    OT Goals(Current goals can be found in the care plan section) Acute Rehab OT Goals Patient Stated Goal: to be able to work again OT Goal Formulation: With patient Time For Goal Achievement: 11/03/14 Potential to Achieve Goals: Good ADL Goals Pt Will Perform Lower Body Bathing: with adaptive equipment;sit to/from stand;with modified independence Pt Will Perform Lower Body Dressing: with supervision;with adaptive equipment;sit to/from stand;with modified independence Pt Will Transfer to Toilet: with modified independence;ambulating;bedside commode Pt Will Perform Toileting - Clothing Manipulation and hygiene: with modified independence;sit to/from stand Pt Will Perform Tub/Shower Transfer: Tub transfer;with min guard assist;ambulating;shower seat;rolling walker Additional ADL Goal #1: Pt will don/doff back brace independently seated EOB 100% of the time.  OT Frequency: Min 2X/week   Barriers to D/C:            Co-evaluation  End of Session Equipment Utilized During Treatment: Gait belt;Rolling walker;Back brace Nurse Communication: Mobility status;Precautions  Activity Tolerance: Patient tolerated treatment well Patient left: in bed;with call bell/phone within reach;with  bed alarm set   Time: 2957-4734 OT Time Calculation (min): 41 min Charges:    G-Codes:    Redmond Baseman 19-Nov-2014, 10:10 AM

## 2014-10-20 NOTE — Clinical Social Work Note (Signed)
CSW Consult Acknowledged:   CSW received consult for SNF placement. CSW reviewed chart, per PT's recommendation pt can transition home. CSW has signed off.   [place], MSW, [place]

## 2014-10-20 NOTE — Progress Notes (Signed)
PT Cancellation Note  Patient Details Name: AMOGH KOMATSU MRN: [id number] DOB: 1951/07/08   Cancelled Treatment:    Reason Eval/Treat Not Completed: Other (comment). Waiting for brace to be delivered. RN aware.   Fredna Stricker 10/20/2014, 8:38 AM Pager [phone]

## 2014-10-20 NOTE — Progress Notes (Signed)
Ortho tech paged.  Kizzie Bane, RN

## 2014-10-20 NOTE — Progress Notes (Signed)
Orthopedic Tech Progress Note Patient Details:  Gregory Diaz 16-May-1951 [id number]  Patient ID: Gregory Diaz, male   DOB: 20-Oct-1951, 63 y.o.   MRN: [id number] Brace order completed by Warnell Forester, Fedora Knisely 10/20/2014, 9:08 AM

## 2014-10-20 NOTE — Progress Notes (Signed)
Subjective: Patient reports no leg pain and some soreness in his back but well-managed.  Objective: Vital signs in last 24 hours: Temp:  [97.5 F (36.4 C)-98.9 F (37.2 C)] 98.9 F (37.2 C) (12/08 0635) Pulse Rate:  [56-81] 64 (12/08 0635) Resp:  [12-31] 20 (12/08 0635) BP: (108-130)/(56-83) 116/56 mmHg (12/08 0635) SpO2:  [96 %-100 %] 100 % (12/08 0635) FiO2 (%):  [99 %] 99 % (12/07 2126) Weight:  [135.626 kg (299 lb)] 135.626 kg (299 lb) (12/07 0815)  Intake/Output from previous day: 12/07 0701 - 12/08 0700 In: 3180 [P.O.:680; I.V.:2300; IV Piggyback:200] Out: 3330 [Urine:2780; Drains:250; Blood:300] Intake/Output this shift:    Strength 5 out of 5 wound clean dry and intact  Lab Results: No results for input(s): WBC, HGB, HCT, PLT in the last 72 hours. BMET No results for input(s): NA, K, CL, CO2, GLUCOSE, BUN, CREATININE, CALCIUM in the last 72 hours.  Studies/Results: Dg Lumbar Spine 2-3 Views  10/19/2014   CLINICAL DATA:  Posterior fusion, intraoperative imaging, persistent back pain  EXAM: DG C-ARM 61-120 MIN; LUMBAR SPINE - 2-3 VIEW  COMPARISON:  09/21/2014  FINDINGS: Bi pedicular screws have have been inserted of the lowest 3 lumbar vertebral bodies. Interbody disc spacers noted. Alignment grossly anatomic.  IMPRESSION: Limited intraoperative imaging during lower lumbar posterior fusion. No acute finding.   Electronically Signed   By: Daryll Brod M.D.   On: 10/19/2014 16:20   Dg C-arm 1-60 Min  10/19/2014   CLINICAL DATA:  Posterior fusion, intraoperative imaging, persistent back pain  EXAM: DG C-ARM 61-120 MIN; LUMBAR SPINE - 2-3 VIEW  COMPARISON:  09/21/2014  FINDINGS: Bi pedicular screws have have been inserted of the lowest 3 lumbar vertebral bodies. Interbody disc spacers noted. Alignment grossly anatomic.  IMPRESSION: Limited intraoperative imaging during lower lumbar posterior fusion. No acute finding.   Electronically Signed   By: Daryll Brod M.D.   On:  10/19/2014 16:20    Assessment/Plan: DC Foley progressive mobilization with physical and occupational therapy. Get him a lumbar corset  LOS: 1 day     Gregory Diaz P 10/20/2014, 7:50 AM

## 2014-10-20 NOTE — Plan of Care (Signed)
Problem: Phase I Progression Outcomes Goal: Pain controlled with appropriate interventions Outcome: Completed/Met Date Met:  10/20/14 Goal: OOB as tolerated unless otherwise ordered Outcome: Completed/Met Date Met:  10/20/14 Goal: Log roll for position change Outcome: Completed/Met Date Met:  10/20/14 Goal: Initial discharge plan identified Outcome: Completed/Met Date Met:  10/20/14 Goal: PT/OT consults requested Outcome: Completed/Met Date Met:  10/20/14 Goal: Hemodynamically stable Outcome: Completed/Met Date Met:  10/20/14  Problem: Phase II Progression Outcomes Goal: Progress activity as tolerated unless otherwise ordered Outcome: Completed/Met Date Met:  10/20/14 Goal: Discharge plan established Outcome: Completed/Met Date Met:  10/20/14 Goal: Tolerating diet Outcome: Completed/Met Date Met:  10/20/14 Goal: Verbalizes of donning/doffing brace Outcome: Completed/Met Date Met:  10/20/14 Goal: Understands assist devices with ambulation Outcome: Completed/Met Date Met:  10/20/14 Goal: PT/OT consults completed Outcome: Completed/Met Date Met:  10/20/14  Problem: Phase III Progression Outcomes Goal: Pain controlled on oral analgesia Outcome: Completed/Met Date Met:  10/20/14 Goal: Activity at appropriate level-compared to baseline (UP IN CHAIR FOR HEMODIALYSIS)  Outcome: Completed/Met Date Met:  10/20/14 Goal: Demonstrates donning/doffing brace Outcome: Completed/Met Date Met:  10/20/14 Goal: Demonstrates proper use of assistive devices Outcome: Completed/Met Date Met:  10/20/14 Goal: Discharge plan remains appropriate-arrangements made Outcome: Completed/Met Date Met:  10/20/14

## 2014-10-21 MED ORDER — SENNA 8.6 MG PO TABS
1.0000 | ORAL_TABLET | Freq: Two times a day (BID) | ORAL | Status: DC
  Administered 2014-10-21 – 2014-10-22 (×4): 8.6 mg via ORAL
  Filled 2014-10-21 (×4): qty 1

## 2014-10-21 MED ORDER — DOCUSATE SODIUM 100 MG PO CAPS
100.0000 mg | ORAL_CAPSULE | Freq: Two times a day (BID) | ORAL | Status: DC
  Administered 2014-10-21 – 2014-10-22 (×4): 100 mg via ORAL
  Filled 2014-10-21 (×4): qty 1

## 2014-10-21 NOTE — Plan of Care (Signed)
Problem: Consults Goal: Nutrition Consult-if indicated Outcome: Not Applicable Date Met:  10/21/14     

## 2014-10-21 NOTE — Progress Notes (Signed)
Patient ID: Gregory Diaz, male   DOB: 01-Nov-1951, 63 y.o.   MRN: [id number] Patient doing great no leg pain back pain well-controlled Ambulating and voiding  Strength out of 5 wound clean dry and intact  DC home in the morning

## 2014-10-21 NOTE — Progress Notes (Signed)
Physical Therapy Treatment Patient Details Name: Gregory Diaz MRN: [id number] DOB: 18-Oct-1951 Today's Date: 10/21/2014    History of Present Illness 63 yo adm 10/19/14 for redo laminectomy L4-5 and fusion L4-S1. PMHx- anxiety, OA, HTN, back surgery    PT Comments    Patient progressing well. Cues to ensure back precautions with mobility. Will continue with current POC.   Follow Up Recommendations  No PT follow up;Supervision - Intermittent     Equipment Recommendations  None recommended by PT    Recommendations for Other Services       Precautions / Restrictions Precautions Precautions: Back Precaution Comments: Patient able to recall all precaution Spinal Brace: Lumbar corset;Applied in sitting position Restrictions Weight Bearing Restrictions: No    Mobility  Bed Mobility Overal bed mobility: Needs Assistance   Rolling: Min assist Sidelying to sit: Min assist     Sit to sidelying: Min guard General bed mobility comments: Verbal cues  and assist for log roll sequence and hand placement. Min (A) to support trunk to come to sitting  Transfers Overall transfer level: Needs assistance Equipment used: Rolling walker (2 wheeled)   Sit to Stand: Min guard         General transfer comment: Cues for safe hand placement   Ambulation/Gait Ambulation/Gait assistance: Min guard Ambulation Distance (Feet): 140 Feet Assistive device: Rolling walker (2 wheeled) Gait Pattern/deviations: Step-through pattern;Decreased stride length     General Gait Details: vc throughout for upright posture    Stairs            Wheelchair Mobility    Modified Rankin (Stroke Patients Only)       Balance                                    Cognition Arousal/Alertness: Awake/alert Behavior During Therapy: WFL for tasks assessed/performed Overall Cognitive Status: Within Functional Limits for tasks assessed       Memory: Decreased recall of  precautions              Exercises      General Comments        Pertinent Vitals/Pain Pain Score: 5  Pain Location: back Pain Descriptors / Indicators: Sore Pain Intervention(s): Monitored during session;Premedicated before session    Home Living                      Prior Function            PT Goals (current goals can now be found in the care plan section) Progress towards PT goals: Progressing toward goals    Frequency  Min 5X/week    PT Plan Current plan remains appropriate    Co-evaluation             End of Session Equipment Utilized During Treatment: Gait belt;Back brace Activity Tolerance: Patient tolerated treatment well Patient left: in bed;with call bell/phone within reach     Time: 0950-1014 PT Time Calculation (min) (ACUTE ONLY): 24 min  Charges:  $Gait Training: 8-22 mins $Therapeutic Activity: 8-22 mins                    G Codes:      Jacqualyn Posey 10/21/2014, 10:52 AM 10/21/2014 Jacqualyn Posey PTA [phone] pager [phone] office

## 2014-10-21 NOTE — Plan of Care (Signed)
Problem: Phase II Progression Outcomes Goal: Other Phase II Outcomes/Goals Outcome: Completed/Met Date Met:  10/21/14

## 2014-10-21 NOTE — Plan of Care (Signed)
Problem: Consults Goal: Diabetes Guidelines if Diabetic/Glucose > 140 If diabetic or lab glucose is > 140 mg/dl - Initiate Diabetes/Hyperglycemia Guidelines & Document Interventions  Outcome: Not Applicable Date Met:  47/42/59

## 2014-10-21 NOTE — Plan of Care (Signed)
Problem: Consults Goal: Skin Care Protocol Initiated - if Braden Score 18 or less If consults are not indicated, leave blank or document N/A  Outcome: Not Applicable Date Met:  10/21/14     

## 2014-10-22 MED ORDER — CYCLOBENZAPRINE HCL 10 MG PO TABS: 10.0000 mg | ORAL_TABLET | Freq: Three times a day (TID) | ORAL | Status: DC | PRN

## 2014-10-22 MED ORDER — OXYCODONE-ACETAMINOPHEN 5-325 MG PO TABS: 1.0000 | ORAL_TABLET | ORAL | Status: DC | PRN

## 2014-10-22 NOTE — Progress Notes (Signed)
Patient was discharged home. Discharge instructions and medications reviewed with the patient.Patient states he understands both.  IV removed.

## 2014-10-22 NOTE — Progress Notes (Signed)
Patient ambulates without staff assistance. Patient educated and made aware of fall prevention. Bed alarm on. Will continue to monitor.

## 2014-10-22 NOTE — Discharge Summary (Signed)
  Physician Discharge Summary  Patient ID: Gregory Diaz MRN: [id number] DOB/AGE: November 22, 1950 63 y.o.  Admit date: 10/19/2014 Discharge date: 10/22/2014  Admission Diagnoses: Lumbar spondylosis spinal stenosis and degenerative disc disease L4-5 L5-S1.  Discharge Diagnoses: Same Active Problems:   Spinal stenosis at L4-L5 level   Discharged Condition: good  Hospital Course: Patient admitted hospital underwent decompressive laminectomy and fusion L4-5 and L5-S1. Postop patient did very well with recovered in the floor on the floor was angling and voiding spontaneously tolerating regular diet was stable for discharge home.  Consults: Significant Diagnostic Studies: Treatments: L4-5 L5-S1 decompression and fusion Discharge Exam: Blood pressure 126/56, pulse 58, temperature 97.6 F (36.4 C), temperature source Oral, resp. rate 18, height 6\' 2"  (1.88 m), weight 135.626 kg (299 lb), SpO2 97 %. Strength 5 out of 5 wound clean dry and intact  Disposition: Home     Medication List    TAKE these medications        cyclobenzaprine 10 MG tablet  Commonly known as:  FLEXERIL  Take 1 tablet (10 mg total) by mouth 3 (three) times daily as needed for muscle spasms.     HYDROcodone-acetaminophen 5-325 MG per tablet  Commonly known as:  NORCO/VICODIN  Take 1 tablet by mouth 2 (two) times daily as needed for moderate pain.     lisinopril-hydrochlorothiazide 20-12.5 MG per tablet  Commonly known as:  PRINZIDE,ZESTORETIC  Take 1 tablet by mouth daily.     oxyCODONE-acetaminophen 5-325 MG per tablet  Commonly known as:  PERCOCET/ROXICET  Take 1-2 tablets by mouth every 4 (four) hours as needed for moderate pain.           Follow-up Information    Follow up with [facility], [facility].   Specialty:  Neurosurgery   Contact information:   [address] [place] [address] [phone]       Signed: Mykenzi Vanzile P 10/22/2014, 8:04 AM

## 2014-10-22 NOTE — Discharge Instructions (Signed)
No lifting, no bending, no twisting, no driving, or riding a car unless you are going back and forth to see your doctor.  Spinal Fusion Care After Refer to this sheet in the next few weeks. These instructions provide you with information on caring for yourself after your procedure. Your caregiver may also give you more specific instructions. Your treatment has been planned according to current medical practices, but problems sometimes occur. Call your caregiver if you have any problems or questions after your procedure. HOME CARE INSTRUCTIONS   Take whatever pain medicine has been prescribed by your caregiver. Do not take over-the-counter pain medicine unless directed otherwise by your caregiver.  Do not drive if you are taking narcotic pain medicines.  Change your bandage (dressing) if necessary or as directed by your caregiver.  Do not get your surgical cut (incision) wet. After a few days you may take quick showers (rather than baths), but keep your incision clean and dry. Covering the incision with plastic wrap while you shower should keep your incision dry. A few weeks after surgery, once your incision has healed and your caregiver says it is okay, you can take baths or go swimming.  If you have been prescribed medicine to prevent your blood from clotting, follow the directions carefully.  Check the area around your incision often. Look for redness and swelling. Also, look for anything leaking from your wound. You can use a mirror or have a family member inspect your incision if it is in a place where it is difficult for you to see.  Ask your caregiver what activities you should avoid and for how long.  Walk as much as possible.  Do not lift anything heavier than 10 pounds (4.5 kilograms) until your caregiver says it is safe.  Do not twist or bend for a few weeks. Try not to pull on things. Avoid sitting for long periods of time. Change positions at least every hour.  Ask your  caregiver what kinds of exercise you should do to make your back stronger and when you should begin doing these exercises. SEEK IMMEDIATE MEDICAL CARE IF:   Pain suddenly becomes much worse.  The incision area is red, swollen, bleeding, or leaking fluid.  Your legs or feet become increasingly painful, numb, weak, or swollen.  You have trouble controlling urination or bowel movements.  You have trouble breathing.  You have chest pain.  You have a fever. MAKE SURE YOU:  Understand these instructions.  Will watch your condition.  Will get help right away if you are not doing well or get worse. Document Released: 05/19/2005 Document Revised: 01/22/2012 Document Reviewed: 01/12/2011 [facility] Patient Information 2015 [place], [place]. This information is not intended to replace advice given to you by your health care provider. Make sure you discuss any questions you have with your health care provider.

## 2014-10-22 NOTE — Progress Notes (Signed)
Physical Therapy Treatment Patient Details Name: Gregory Diaz MRN: [id number] DOB: [birth year] Today's Date: 10/22/2014    History of Present Illness 63 yo adm 10/19/14 for redo laminectomy L4-5 and fusion L4-S1. PMHx- anxiety, OA, HTN, back surgery    PT Comments    Patient progressing well. Eager to DC home. Patient safe to D/C from a mobility standpoint based on progression towards goals set on PT eval.    Follow Up Recommendations  No PT follow up;Supervision - Intermittent     Equipment Recommendations  None recommended by PT    Recommendations for Other Services       Precautions / Restrictions Precautions Precautions: Back Precaution Comments: Patient able to recall all precaution Required Braces or Orthoses: Spinal Brace Spinal Brace: Lumbar corset;Applied in sitting position Restrictions Weight Bearing Restrictions: No    Mobility  Bed Mobility Overal bed mobility: Modified Independent                Transfers   Equipment used: Rolling walker (2 wheeled)   Sit to Stand: Supervision         General transfer comment: Cues for safe hand placement. Patient tends to want to pull up on RW  Ambulation/Gait Ambulation/Gait assistance: Supervision Ambulation Distance (Feet): 600 Feet Assistive device: Rolling walker (2 wheeled) Gait Pattern/deviations: Step-through pattern;Decreased stride length     General Gait Details: vc throughout for upright posture    Stairs            Wheelchair Mobility    Modified Rankin (Stroke Patients Only)       Balance                                    Cognition Arousal/Alertness: Awake/alert Behavior During Therapy: WFL for tasks assessed/performed Overall Cognitive Status: Within Functional Limits for tasks assessed                      Exercises      General Comments        Pertinent Vitals/Pain Pain Score: 2  Pain Location: back Pain Descriptors / Indicators:  Sore Pain Intervention(s): Monitored during session    Home Living                      Prior Function            PT Goals (current goals can now be found in the care plan section) Progress towards PT goals: Progressing toward goals    Frequency  Min 5X/week    PT Plan Current plan remains appropriate    Co-evaluation             End of Session Equipment Utilized During Treatment: Gait belt;Back brace Activity Tolerance: Patient tolerated treatment well Patient left: with call bell/phone within reach;in chair;with chair alarm set     Time: 2458-0998 PT Time Calculation (min) (ACUTE ONLY): 23 min  Charges:  $Gait Training: 8-22 mins $Therapeutic Activity: 8-22 mins                    G Codes:      Jacqualyn Posey 10/22/2014, 8:55 AM  10/22/2014 Jacqualyn Posey PTA [phone] pager [phone] office

## 2014-10-22 NOTE — Progress Notes (Signed)
Occupational Therapy Treatment Patient Details Name: Gregory Diaz MRN: [id number] DOB: January 20, 1951 Today's Date: 10/22/2014    History of present illness 63 yo adm 10/19/14 for redo laminectomy L4-5 and fusion L4-S1. PMHx- anxiety, OA, HTN, back surgery   OT comments  Pt progressing well with ADL retraining and functional mobility. Pt with good understanding and demonstration of back precautions during activities. Pt needed several verbal cues for safe RW positioning and use. Discharge plan updated due to patient's increased safety with balance and mobility.   Follow Up Recommendations  No OT follow up;Supervision - Intermittent    Equipment Recommendations  3 in 1 bedside comode;Tub/shower seat;Other (comment) (AE (pt will purchase))    Recommendations for Other Services      Precautions / Restrictions Precautions Precautions: Back Precaution Comments: Pt able to verbalize 3/3 back precautions Required Braces or Orthoses: Spinal Brace Spinal Brace: Lumbar corset;Applied in sitting position Restrictions Weight Bearing Restrictions: No       Mobility Bed Mobility Overal bed mobility: Needs Assistance Bed Mobility: Rolling;Sit to Sidelying Rolling: Supervision       Sit to sidelying: Supervision General bed mobility comments: HOB flat; no use of bedrails. Good demo of log roll technique  Transfers Overall transfer level: Needs assistance Equipment used: Rolling walker (2 wheeled) Transfers: Sit to/from Stand Sit to Stand: Supervision         General transfer comment: Verbal cues for hand placement on surfaces, pt attempting to pull up on RW. Verbal cues to maintain back precautions, pt tending to bend forward excessively to stand.    Balance Overall balance assessment: Needs assistance Sitting-balance support: No upper extremity supported;Feet supported Sitting balance-Leahy Scale: Good     Standing balance support: Bilateral upper extremity supported;During  functional activity Standing balance-Leahy Scale: Fair                     ADL Overall ADL's : Needs assistance/impaired     Grooming: Wash/dry hands;Supervision/safety;Standing               Lower Body Dressing: Set up;Adhering to back precautions;With adaptive equipment;Sitting/lateral leans Lower Body Dressing Details (indicate cue type and reason): Pt don/doff socks with sock aide. Toilet Transfer: Min guard;Cueing for safety;Ambulation;Regular Toilet;RW Toilet Transfer Details (indicate cue type and reason): Pt with LOB while backing up to toilet, min (A) to regain balance. Verbal cues to avoid looking back to see toilet (twisting). Directional cues to back up to toilet, feel it with back of legs, one hand on RW and one hand on BSC before descending. Verbal cues to feel toilet on back of legs to avoid looking back and twisting. Toileting- Water quality scientist and Hygiene: Min guard;Sit to/from stand   Tub/ Shower Transfer: Tub transfer;Min guard;Adhering to back precautions;Cueing for sequencing;Ambulation;Rolling walker Tub/Shower Transfer Details (indicate cue type and reason): Min guard (A) for safety and balance. Verbal cues for safe hand placement and RW use during transfer.  Functional mobility during ADLs: Min guard;Rolling walker General ADL Comments: Pt required verbal cues throughout session for safe RW positioning and use. Pt educated and practiced with AE for LB adls and discussed cost. Pt completed tub transfer with min guard (A) and discussed importance of having (A) from wife or daughter when getting in/out of tub. Discussed importance of continuing to walk with supervision at home.      Vision  Perception     Praxis      Cognition   Behavior During Therapy: WFL for tasks assessed/performed Overall Cognitive Status: Within Functional Limits for tasks assessed                       Extremity/Trunk Assessment                Exercises     Shoulder Instructions       General Comments      Pertinent Vitals/ Pain       Pain Assessment: 0-10 Pain Score: 5  Pain Location: surgical site-back Pain Descriptors / Indicators: Sore Pain Intervention(s): Monitored during session;Repositioned  Home Living                                          Prior Functioning/Environment              Frequency Min 2X/week     Progress Toward Goals  OT Goals(current goals can now be found in the care plan section)  Progress towards OT goals: Progressing toward goals  Acute Rehab OT Goals Patient Stated Goal: to go home OT Goal Formulation: With patient Time For Goal Achievement: 11/03/14 Potential to Achieve Goals: Good ADL Goals Pt Will Perform Lower Body Bathing: with adaptive equipment;sit to/from stand;with modified independence Pt Will Perform Lower Body Dressing: with supervision;with adaptive equipment;sit to/from stand;with modified independence Pt Will Transfer to Toilet: with modified independence;ambulating;bedside commode Pt Will Perform Toileting - Clothing Manipulation and hygiene: with modified independence;sit to/from stand Pt Will Perform Tub/Shower Transfer: Tub transfer;with min guard assist;ambulating;shower seat;rolling walker Additional ADL Goal #1: Pt will don/doff back brace independently seated EOB 100% of the time.  Plan Discharge plan needs to be updated    Co-evaluation                 End of Session Equipment Utilized During Treatment: Gait belt;Rolling walker;Back brace   Activity Tolerance Patient tolerated treatment well   Patient Left in bed;with call bell/phone within reach;with bed alarm set   Nurse Communication Mobility status;Precautions        Time: 6222-9798 OT Time Calculation (min): 35 min  Charges:    Redmond Baseman 10/22/2014, 10:41 AM

## 2014-10-22 NOTE — Progress Notes (Signed)
Talked to patient about DCP/ [place] choices, patient does not want any HHC at this time and does not want any DME; B Pennie Rushing [phone]

## 2014-10-22 NOTE — Progress Notes (Signed)
Patient ID: Gregory Diaz, male   DOB: 1951/09/06, 63 y.o.   MRN: [id number] Doing well neurologically improved discharged home

## 2014-10-26 ENCOUNTER — Encounter (HOSPITAL_COMMUNITY): Payer: Self-pay

## 2014-10-26 ENCOUNTER — Emergency Department (HOSPITAL_COMMUNITY)
Admission: EM | Admit: 2014-10-26 | Discharge: 2014-10-26 | Disposition: A | Discharge status: Home / Self Care | Payer: Medicare HMO | Attending: Emergency Medicine | Admitting: Emergency Medicine

## 2014-10-26 DIAGNOSIS — Z880 Allergy status to penicillin: Secondary | ICD-10-CM | POA: Insufficient documentation

## 2014-10-26 DIAGNOSIS — Z8719 Personal history of other diseases of the digestive system: Secondary | ICD-10-CM | POA: Insufficient documentation

## 2014-10-26 DIAGNOSIS — Z87448 Personal history of other diseases of urinary system: Secondary | ICD-10-CM | POA: Insufficient documentation

## 2014-10-26 DIAGNOSIS — Z79899 Other long term (current) drug therapy: Secondary | ICD-10-CM | POA: Diagnosis not present

## 2014-10-26 DIAGNOSIS — R220 Localized swelling, mass and lump, head: Secondary | ICD-10-CM | POA: Diagnosis present

## 2014-10-26 DIAGNOSIS — M1990 Unspecified osteoarthritis, unspecified site: Secondary | ICD-10-CM | POA: Insufficient documentation

## 2014-10-26 DIAGNOSIS — Z8659 Personal history of other mental and behavioral disorders: Secondary | ICD-10-CM | POA: Diagnosis not present

## 2014-10-26 DIAGNOSIS — Z720 Tobacco use: Secondary | ICD-10-CM | POA: Diagnosis not present

## 2014-10-26 DIAGNOSIS — R011 Cardiac murmur, unspecified: Secondary | ICD-10-CM | POA: Insufficient documentation

## 2014-10-26 DIAGNOSIS — H269 Unspecified cataract: Secondary | ICD-10-CM | POA: Diagnosis not present

## 2014-10-26 DIAGNOSIS — I10 Essential (primary) hypertension: Secondary | ICD-10-CM | POA: Diagnosis not present

## 2014-10-26 DIAGNOSIS — T464X5A Adverse effect of angiotensin-converting-enzyme inhibitors, initial encounter: Secondary | ICD-10-CM | POA: Insufficient documentation

## 2014-10-26 DIAGNOSIS — T7840XA Allergy, unspecified, initial encounter: Secondary | ICD-10-CM

## 2014-10-26 MED ORDER — DIPHENHYDRAMINE HCL 50 MG/ML IJ SOLN
25.0000 mg | Freq: Once | INTRAMUSCULAR | Status: AC
  Administered 2014-10-26: 25 mg via INTRAVENOUS
  Filled 2014-10-26: qty 1

## 2014-10-26 MED ORDER — FAMOTIDINE IN NACL 20-0.9 MG/50ML-% IV SOLN
20.0000 mg | Freq: Once | INTRAVENOUS | Status: AC
  Administered 2014-10-26: 20 mg via INTRAVENOUS
  Filled 2014-10-26: qty 50

## 2014-10-26 MED ORDER — METHYLPREDNISOLONE SODIUM SUCC 125 MG IJ SOLR
125.0000 mg | Freq: Once | INTRAMUSCULAR | Status: AC
  Administered 2014-10-26: 125 mg via INTRAVENOUS
  Filled 2014-10-26: qty 2

## 2014-10-26 MED ORDER — FAMOTIDINE 20 MG PO TABS: 20.0000 mg | ORAL_TABLET | Freq: Two times a day (BID) | ORAL | Status: DC

## 2014-10-26 MED ORDER — PREDNISONE 10 MG PO TABS: 20.0000 mg | ORAL_TABLET | Freq: Every day | ORAL | Status: DC

## 2014-10-26 NOTE — ED Provider Notes (Signed)
CSN: [id number]     Arrival date & time 10/26/14  0805 History  This chart was scribed for Maudry Diego, MD by Stephania Fragmin, ED Scribe. This patient was seen in room APA01/APA01 and the patient's care was started at 8:44 AM.    Chief Complaint  Patient presents with  . Facial Swelling   The history is provided by the patient. No language interpreter was used.     HPI Comments: Gregory Diaz is a 63 y.o. male who presents to the Emergency Department complaining of facial swelling that began this morning. He also complains of associated pain around his jaws. This is a new problem. Patient takes Lisinopril.  Past Medical History  Diagnosis Date  . Hypertension   . Heart murmur     was told at age 58, but has not had any problems  . Enlarged prostate   . Anxiety   . Arthritis   . Early cataracts, bilateral    Past Surgical History  Procedure Laterality Date  . Hemorroidectomy    . Back surgery    . Fracture surgery Right     thumb  . Colonoscopy    . Hernia repair      umbilical hernia repair  . Tonsillectomy     Family History  Problem Relation Age of Onset  . Gallstones Father   . Ulcers Father    History  Substance Use Topics  . Smoking status: Current Every Day Smoker -- 1.00 packs/day    Types: Cigarettes  . Smokeless tobacco: Never Used  . Alcohol Use: No    Review of Systems  Constitutional: Negative for appetite change and fatigue.  HENT: Positive for facial swelling. Negative for congestion, ear discharge and sinus pressure.   Eyes: Negative for discharge.  Respiratory: Negative for cough.   Cardiovascular: Negative for chest pain.  Gastrointestinal: Negative for abdominal pain and diarrhea.  Genitourinary: Negative for frequency and hematuria.  Musculoskeletal: Negative for back pain.  Skin: Negative for rash.  Neurological: Negative for seizures and headaches.  Psychiatric/Behavioral: Negative for hallucinations.      Allergies   Penicillins  Home Medications   Prior to Admission medications   Medication Sig Start Date End Date Taking? Authorizing Provider  cyclobenzaprine (FLEXERIL) 10 MG tablet Take 1 tablet (10 mg total) by mouth 3 (three) times daily as needed for muscle spasms. 10/22/14   Elaina Hoops, MD  HYDROcodone-acetaminophen (NORCO/VICODIN) 5-325 MG per tablet Take 1 tablet by mouth 2 (two) times daily as needed for moderate pain.    Historical Provider, MD  lisinopril-hydrochlorothiazide (PRINZIDE,ZESTORETIC) 20-12.5 MG per tablet Take 1 tablet by mouth daily.    Historical Provider, MD  oxyCODONE-acetaminophen (PERCOCET/ROXICET) 5-325 MG per tablet Take 1-2 tablets by mouth every 4 (four) hours as needed for moderate pain. 10/22/14   Elaina Hoops, MD   BP 113/84 mmHg  Pulse 65  Temp(Src) 97.7 F (36.5 C) (Oral)  Resp 20  Ht 6\' 2"  (1.88 m)  Wt 300 lb (136.079 kg)  BMI 38.50 kg/m2  SpO2 100% Physical Exam  Constitutional: He is oriented to person, place, and time. He appears well-developed.  HENT:  Head: Normocephalic.  Significant swelling to upper lips. Cheeks are mildly swollen bilaterally.  Eyes: Conjunctivae are normal.  Neck: No tracheal deviation present.  Cardiovascular:  No murmur heard. Musculoskeletal: Normal range of motion.  Neurological: He is oriented to person, place, and time.  Skin: Skin is warm.  Psychiatric: He has a normal  mood and affect.  Nursing note and vitals reviewed.   ED Course  Procedures (including critical care time)  DIAGNOSTIC STUDIES: Oxygen Saturation is 100% on room air, normal by my interpretation.    COORDINATION OF CARE: 8:46 AM - Discussed treatment plan with pt at bedside which includes medications and pt agreed to plan.   Labs Review Labs Reviewed - No data to display  Imaging Review No results found.   EKG Interpretation None      MDM   Final diagnoses:  None    Allergic reaction to bp meds.  Pt improving.  tx with  prednisone and pepcid and follow up  The chart was scribed for me under my direct supervision.  I personally performed the history, physical, and medical decision making and all procedures in the evaluation of this patient.Maudry Diego, MD 10/26/14 1055

## 2014-10-26 NOTE — ED Notes (Signed)
Pt states he feels the swelling is going down. Pt's wife here and she states his mouth looks a lot better

## 2014-10-26 NOTE — Discharge Instructions (Signed)
Follow up with your md this week.  Stop your bp med

## 2014-10-26 NOTE — ED Notes (Signed)
Pt states he had back surgery 10/19/14. States on the 9th he felt like he had spider webs on his face and it felt tight. Face is swollen now.

## 2015-01-11 ENCOUNTER — Other Ambulatory Visit: Payer: Self-pay | Admitting: Neurosurgery

## 2015-01-11 DIAGNOSIS — M5137 Other intervertebral disc degeneration, lumbosacral region: Secondary | ICD-10-CM

## 2015-01-14 ENCOUNTER — Ambulatory Visit
Admission: RE | Admit: 2015-01-14 | Discharge: 2015-01-14 | Disposition: A | Discharge status: Home / Self Care | Payer: Medicare HMO | Source: Ambulatory Visit | Attending: Neurosurgery | Admitting: Neurosurgery

## 2015-01-14 DIAGNOSIS — M5137 Other intervertebral disc degeneration, lumbosacral region: Secondary | ICD-10-CM

## 2015-01-26 ENCOUNTER — Encounter: Payer: Self-pay | Admitting: Orthopedic Surgery

## 2015-01-26 ENCOUNTER — Ambulatory Visit (INDEPENDENT_AMBULATORY_CARE_PROVIDER_SITE_OTHER): Payer: Medicare HMO

## 2015-01-26 ENCOUNTER — Ambulatory Visit (INDEPENDENT_AMBULATORY_CARE_PROVIDER_SITE_OTHER): Payer: Medicare HMO | Admitting: Orthopedic Surgery

## 2015-01-26 VITALS — BP 143/76 | Ht 74.0 in | Wt 299.0 lb

## 2015-01-26 DIAGNOSIS — R1032 Left lower quadrant pain: Secondary | ICD-10-CM

## 2015-01-26 NOTE — Patient Instructions (Signed)
Recommend follow-up with Dr. Saintclair Halsted  I agree that the patient should undergo physical therapy

## 2015-01-26 NOTE — Progress Notes (Signed)
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 Saintclair Halsted  He is scheduled for physical therapy to start in May but he wanted his groin area checked out by an orthopedist prior to that time. He does note that in 2013 he did a split and tore his groin. 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 He had nasal surgery in 2005  He's currently on oxycodone cyclobenzaprine prednisone lisinopril at  BP 143/76 mmHg  Ht 6\' 2"  (1.88 m)  Wt 299 lb (135.626 kg)  BMI 38.37 kg/m2 His appearance is normal he's pleasant is oriented times threes in a good mood is affect is pleasant he walks with a slightly positive bilateral foot progression angle slight limp and a slight decrease in stride length and cadence  On inspection he has no tenderness in his groin area he does have numbness and tingling on the top of his leg and top of his foot range of motion in his hip is normal except for decreased internal rotation his hip is stable his muscle strength and tone are normal his knee is stable his skin is intact he has a scar on the posterior aspect of his thigh from a lipoma has good distal pulses with mild edema and there is no lymph nodes in the groin  his balance is normal  His x-ray in the hip shows a normal hip joint  Impression groin pain but primarily the symptoms that is preventing him from returning to his normal status appears to be the neurogenic pain on the dorsum of his shin and dorsum of his foot which appear to be in the L5 distribution with slight L3 distribution pain as well  His CT scan in 2016 and March 3 shows that he does have some disc disease and stenosis above his fusion  I returned him to Dr. Saintclair Halsted with a note to follow

## 2015-03-05 ENCOUNTER — Other Ambulatory Visit: Payer: Self-pay | Admitting: Neurosurgery

## 2015-03-05 DIAGNOSIS — M5126 Other intervertebral disc displacement, lumbar region: Secondary | ICD-10-CM

## 2015-03-11 ENCOUNTER — Encounter (HOSPITAL_COMMUNITY): Payer: Self-pay

## 2015-03-11 ENCOUNTER — Emergency Department (HOSPITAL_COMMUNITY)
Admission: EM | Admit: 2015-03-11 | Discharge: 2015-03-11 | Disposition: A | Discharge status: Home / Self Care | Payer: Medicare HMO | Attending: Emergency Medicine | Admitting: Emergency Medicine

## 2015-03-11 ENCOUNTER — Emergency Department (HOSPITAL_COMMUNITY): Payer: Medicare HMO

## 2015-03-11 DIAGNOSIS — Z9889 Other specified postprocedural states: Secondary | ICD-10-CM | POA: Insufficient documentation

## 2015-03-11 DIAGNOSIS — M1990 Unspecified osteoarthritis, unspecified site: Secondary | ICD-10-CM | POA: Insufficient documentation

## 2015-03-11 DIAGNOSIS — R109 Unspecified abdominal pain: Secondary | ICD-10-CM

## 2015-03-11 DIAGNOSIS — Z87448 Personal history of other diseases of urinary system: Secondary | ICD-10-CM | POA: Insufficient documentation

## 2015-03-11 DIAGNOSIS — R1033 Periumbilical pain: Secondary | ICD-10-CM | POA: Insufficient documentation

## 2015-03-11 DIAGNOSIS — H269 Unspecified cataract: Secondary | ICD-10-CM | POA: Insufficient documentation

## 2015-03-11 DIAGNOSIS — R1032 Left lower quadrant pain: Secondary | ICD-10-CM | POA: Diagnosis present

## 2015-03-11 DIAGNOSIS — Z720 Tobacco use: Secondary | ICD-10-CM | POA: Insufficient documentation

## 2015-03-11 DIAGNOSIS — Z8659 Personal history of other mental and behavioral disorders: Secondary | ICD-10-CM | POA: Insufficient documentation

## 2015-03-11 DIAGNOSIS — Z880 Allergy status to penicillin: Secondary | ICD-10-CM | POA: Diagnosis not present

## 2015-03-11 DIAGNOSIS — Z9049 Acquired absence of other specified parts of digestive tract: Secondary | ICD-10-CM | POA: Diagnosis not present

## 2015-03-11 DIAGNOSIS — I10 Essential (primary) hypertension: Secondary | ICD-10-CM | POA: Diagnosis not present

## 2015-03-11 DIAGNOSIS — Z79899 Other long term (current) drug therapy: Secondary | ICD-10-CM | POA: Diagnosis not present

## 2015-03-11 LAB — CBC WITH DIFFERENTIAL/PLATELET
Basophils Absolute: 0 10*3/uL (ref 0.0–0.1)
Basophils Relative: 0 % (ref 0–1)
Eosinophils Absolute: 0.1 10*3/uL (ref 0.0–0.7)
Eosinophils Relative: 1 % (ref 0–5)
HCT: 44.8 % (ref 39.0–52.0)
Hemoglobin: 14.5 g/dL (ref 13.0–17.0)
Lymphocytes Relative: 30 % (ref 12–46)
Lymphs Abs: 2.2 10*3/uL (ref 0.7–4.0)
MCH: 27.9 pg (ref 26.0–34.0)
MCHC: 32.4 g/dL (ref 30.0–36.0)
MCV: 86.3 fL (ref 78.0–100.0)
Monocytes Absolute: 0.5 10*3/uL (ref 0.1–1.0)
Monocytes Relative: 7 % (ref 3–12)
Neutro Abs: 4.5 10*3/uL (ref 1.7–7.7)
Neutrophils Relative %: 62 % (ref 43–77)
Platelets: 235 10*3/uL (ref 150–400)
RBC: 5.19 MIL/uL (ref 4.22–5.81)
RDW: 12.6 % (ref 11.5–15.5)
WBC: 7.3 10*3/uL (ref 4.0–10.5)

## 2015-03-11 LAB — COMPREHENSIVE METABOLIC PANEL
ALT: 15 U/L (ref 0–53)
AST: 17 U/L (ref 0–37)
Albumin: 3.5 g/dL (ref 3.5–5.2)
Alkaline Phosphatase: 64 U/L (ref 39–117)
Anion gap: 6 (ref 5–15)
BUN: 10 mg/dL (ref 6–23)
CO2: 26 mmol/L (ref 19–32)
Calcium: 8.7 mg/dL (ref 8.4–10.5)
Chloride: 106 mmol/L (ref 96–112)
Creatinine, Ser: 0.86 mg/dL (ref 0.50–1.35)
GFR calc Af Amer: >90 mL/min (ref >90)
GFR calc non Af Amer: 89 mL/min — ABNORMAL LOW (ref >90)
Glucose, Bld: 104 mg/dL — ABNORMAL HIGH (ref 70–99)
Potassium: 3.7 mmol/L (ref 3.5–5.1)
Sodium: 138 mmol/L (ref 135–145)
Total Bilirubin: 0.8 mg/dL (ref 0.3–1.2)
Total Protein: 6.5 g/dL (ref 6.0–8.3)

## 2015-03-11 LAB — URINALYSIS, ROUTINE W REFLEX MICROSCOPIC
Bilirubin Urine: NEGATIVE
Glucose, UA: NEGATIVE mg/dL
Hgb urine dipstick: NEGATIVE
Ketones, ur: NEGATIVE mg/dL
Leukocytes, UA: NEGATIVE
Nitrite: NEGATIVE
Protein, ur: NEGATIVE mg/dL
Specific Gravity, Urine: 1.015 (ref 1.005–1.030)
Urobilinogen, UA: 0.2 mg/dL (ref 0.0–1.0)
pH: 7 (ref 5.0–8.0)

## 2015-03-11 LAB — LIPASE, BLOOD: Lipase: 22 U/L (ref 11–59)

## 2015-03-11 MED ORDER — HYDROCODONE-ACETAMINOPHEN 5-325 MG PO TABS: 1.0000 | ORAL_TABLET | Freq: Four times a day (QID) | ORAL | Status: DC | PRN

## 2015-03-11 MED ORDER — IOHEXOL 300 MG/ML  SOLN
100.0000 mL | Freq: Once | INTRAMUSCULAR | Status: AC | PRN
  Administered 2015-03-11: 100 mL via INTRAVENOUS

## 2015-03-11 MED ORDER — IOHEXOL 300 MG/ML  SOLN
50.0000 mL | Freq: Once | INTRAMUSCULAR | Status: AC | PRN
  Administered 2015-03-11: 50 mL via ORAL

## 2015-03-11 MED ORDER — ONDANSETRON HCL 4 MG/2ML IJ SOLN
4.0000 mg | Freq: Once | INTRAMUSCULAR | Status: AC
  Administered 2015-03-11: 4 mg via INTRAVENOUS
  Filled 2015-03-11: qty 2

## 2015-03-11 MED ORDER — SODIUM CHLORIDE 0.9 % IJ SOLN
INTRAMUSCULAR | Status: AC
  Filled 2015-03-11: qty 600

## 2015-03-11 MED ORDER — SODIUM CHLORIDE 0.9 % IV SOLN
INTRAVENOUS | Status: DC
  Administered 2015-03-11: 15:00:00 via INTRAVENOUS

## 2015-03-11 MED ORDER — SODIUM CHLORIDE 0.9 % IV BOLUS (SEPSIS)
250.0000 mL | Freq: Once | INTRAVENOUS | Status: AC
  Administered 2015-03-11: 250 mL via INTRAVENOUS

## 2015-03-11 MED ORDER — FENTANYL CITRATE (PF) 100 MCG/2ML IJ SOLN
50.0000 ug | Freq: Once | INTRAMUSCULAR | Status: AC
  Administered 2015-03-11: 50 ug via INTRAVENOUS
  Filled 2015-03-11: qty 2

## 2015-03-11 NOTE — Discharge Instructions (Signed)
Workup for the abdominal pain without any significant findings. No evidence of any recurrent hernia. Take the medications as directed and follow-up with irregular doctor. If symptoms persist may require colonoscopy for further evaluation.

## 2015-03-11 NOTE — ED Notes (Signed)
Dr. Delanna Ahmadi office called and reports that pt has history of hernia repair and came to office today with c/o abd pain in the same area.  Pt sent here for further eval.

## 2015-03-11 NOTE — ED Notes (Signed)
Patient is resting comfortably. 

## 2015-03-11 NOTE — ED Provider Notes (Signed)
CSN: [id number]     Arrival date & time 03/11/15  1211 History   First MD Initiated Contact with Patient 03/11/15 1320     Chief Complaint  Patient presents with  . Abdominal Pain     (Consider location/radiation/quality/duration/timing/severity/associated sxs/prior Treatment) Patient is a 65 y.o. male presenting with abdominal pain. The history is provided by the patient.  Abdominal Pain Associated symptoms: no chest pain, no diarrhea, no dysuria, no fever, no nausea, no shortness of breath and no vomiting    patient referred from primary care doctors off this for 2-3 day history of abdominal pain. Patient had a hernia repair to the umbilicus many years ago. Patient has pain in that location and left lower quadrant part of the abdomen. No fever no nausea vomiting or diarrhea no blood in the bowel movements. Patient states that the current pain is 7 out of 10. Ache and sharp in nature not made worse or better by anything. Patient also states that several years ago he did have a discharge from the umbilical area. That is not reoccurred since then.  Past Medical History  Diagnosis Date  . Hypertension   . Heart murmur     was told at age 32, but has not had any problems  . Enlarged prostate   . Anxiety   . Arthritis   . Early cataracts, bilateral    Past Surgical History  Procedure Laterality Date  . Hemorroidectomy    . Back surgery    . Fracture surgery Right     thumb  . Colonoscopy    . Hernia repair      umbilical hernia repair  . Tonsillectomy     Family History  Problem Relation Age of Onset  . Gallstones Father   . Ulcers Father    History  Substance Use Topics  . Smoking status: Current Every Day Smoker -- 1.00 packs/day    Types: Cigarettes  . Smokeless tobacco: Never Used  . Alcohol Use: No    Review of Systems  Constitutional: Negative for fever.  HENT: Negative for congestion.   Eyes: Negative for redness.  Respiratory: Negative for shortness of breath.    Cardiovascular: Negative for chest pain.  Gastrointestinal: Positive for abdominal pain. Negative for nausea, vomiting, diarrhea and blood in stool.  Genitourinary: Negative for dysuria.  Musculoskeletal: Negative for back pain.  Skin: Negative for rash.  Neurological: Negative for headaches.  Hematological: Does not bruise/bleed easily.  Psychiatric/Behavioral: Negative for confusion.      Allergies  Penicillins  Home Medications   Prior to Admission medications   Medication Sig Start Date End Date Taking? Authorizing Provider  acetaminophen (TYLENOL) 500 MG tablet Take 1,000 mg by mouth every 6 (six) hours as needed for mild pain, moderate pain or headache.   Yes Historical Provider, MD  fosinopril-hydrochlorothiazide (MONOPRIL-HCT) 20-12.5 MG per tablet Take 1 tablet by mouth daily. 01/26/15  Yes Historical Provider, MD  cyclobenzaprine (FLEXERIL) 10 MG tablet Take 1 tablet (10 mg total) by mouth 3 (three) times daily as needed for muscle spasms. Patient not taking: Reported on 03/11/2015 10/22/14   Kary Kos, MD  HYDROcodone-acetaminophen (NORCO/VICODIN) 5-325 MG per tablet Take 1-2 tablets by mouth every 6 (six) hours as needed. 03/11/15   Fredia Sorrow, MD  oxyCODONE-acetaminophen (PERCOCET/ROXICET) 5-325 MG per tablet Take 1-2 tablets by mouth every 4 (four) hours as needed for moderate pain. Patient not taking: Reported on 03/11/2015 10/22/14   Kary Kos, MD  predniSONE (DELTASONE) 10 MG  tablet Take 2 tablets (20 mg total) by mouth daily. Patient not taking: Reported on 03/11/2015 10/26/14   Milton Ferguson, MD   BP 143/83 mmHg  Pulse 51  Temp(Src) 97.6 F (36.4 C) (Oral)  Resp 15  Ht 6\' 2"  (1.88 m)  Wt 290 lb (131.543 kg)  BMI 37.22 kg/m2  SpO2 100% Physical Exam  Constitutional: He is oriented to person, place, and time. He appears well-developed and well-nourished. No distress.  HENT:  Head: Normocephalic and atraumatic.  Mouth/Throat: Oropharynx is clear and moist.   Eyes: Conjunctivae and EOM are normal. Pupils are equal, round, and reactive to light.  Neck: Normal range of motion.  Cardiovascular: Normal rate, regular rhythm and normal heart sounds.   No murmur heard. Pulmonary/Chest: Effort normal and breath sounds normal. No respiratory distress.  Abdominal: Soft. Bowel sounds are normal. There is tenderness. There is no guarding.  Mild tenderness to the left side of the umbilicus. No mass no redness no guarding. No discharge.  Musculoskeletal: Normal range of motion.  Neurological: He is alert and oriented to person, place, and time. No cranial nerve deficit. He exhibits normal muscle tone. Coordination normal.  Skin: Skin is warm. No rash noted. No erythema.  Nursing note and vitals reviewed.   ED Course  Procedures (including critical care time) Labs Review Labs Reviewed  COMPREHENSIVE METABOLIC PANEL - Abnormal; Notable for the following:    Glucose, Bld 104 (*)    GFR calc non Af Amer 89 (*)    All other components within normal limits  LIPASE, BLOOD  CBC WITH DIFFERENTIAL/PLATELET  URINALYSIS, ROUTINE W REFLEX MICROSCOPIC   Results for orders placed or performed during the hospital encounter of 03/11/15  Comprehensive metabolic panel  Result Value Ref Range   Sodium 138 135 - 145 mmol/L   Potassium 3.7 3.5 - 5.1 mmol/L   Chloride 106 96 - 112 mmol/L   CO2 26 19 - 32 mmol/L   Glucose, Bld 104 (H) 70 - 99 mg/dL   BUN 10 6 - 23 mg/dL   Creatinine, Ser 0.86 0.50 - 1.35 mg/dL   Calcium 8.7 8.4 - 10.5 mg/dL   Total Protein 6.5 6.0 - 8.3 g/dL   Albumin 3.5 3.5 - 5.2 g/dL   AST 17 0 - 37 U/L   ALT 15 0 - 53 U/L   Alkaline Phosphatase 64 39 - 117 U/L   Total Bilirubin 0.8 0.3 - 1.2 mg/dL   GFR calc non Af Amer 89 (L) >90 mL/min   GFR calc Af Amer >90 >90 mL/min   Anion gap 6 5 - 15  Lipase, blood  Result Value Ref Range   Lipase 22 11 - 59 U/L  CBC with Differential/Platelet  Result Value Ref Range   WBC 7.3 4.0 - 10.5 K/uL    RBC 5.19 4.22 - 5.81 MIL/uL   Hemoglobin 14.5 13.0 - 17.0 g/dL   HCT 44.8 39.0 - 52.0 %   MCV 86.3 78.0 - 100.0 fL   MCH 27.9 26.0 - 34.0 pg   MCHC 32.4 30.0 - 36.0 g/dL   RDW 12.6 11.5 - 15.5 %   Platelets 235 150 - 400 K/uL   Neutrophils Relative % 62 43 - 77 %   Neutro Abs 4.5 1.7 - 7.7 K/uL   Lymphocytes Relative 30 12 - 46 %   Lymphs Abs 2.2 0.7 - 4.0 K/uL   Monocytes Relative 7 3 - 12 %   Monocytes Absolute 0.5 0.1 - 1.0  K/uL   Eosinophils Relative 1 0 - 5 %   Eosinophils Absolute 0.1 0.0 - 0.7 K/uL   Basophils Relative 0 0 - 1 %   Basophils Absolute 0.0 0.0 - 0.1 K/uL  Urinalysis, Routine w reflex microscopic  Result Value Ref Range   Color, Urine YELLOW YELLOW   APPearance CLEAR CLEAR   Specific Gravity, Urine 1.015 1.005 - 1.030   pH 7.0 5.0 - 8.0   Glucose, UA NEGATIVE NEGATIVE mg/dL   Hgb urine dipstick NEGATIVE NEGATIVE   Bilirubin Urine NEGATIVE NEGATIVE   Ketones, ur NEGATIVE NEGATIVE mg/dL   Protein, ur NEGATIVE NEGATIVE mg/dL   Urobilinogen, UA 0.2 0.0 - 1.0 mg/dL   Nitrite NEGATIVE NEGATIVE   Leukocytes, UA NEGATIVE NEGATIVE     Imaging Review Ct Abdomen Pelvis W Contrast  03/11/2015   CLINICAL DATA:  Pain at site of hernia repair.  EXAM: CT ABDOMEN AND PELVIS WITH CONTRAST  TECHNIQUE: Multidetector CT imaging of the abdomen and pelvis was performed using the standard protocol following bolus administration of intravenous contrast.  CONTRAST:  77mL OMNIPAQUE IOHEXOL 300 MG/ML SOLN, 158mL OMNIPAQUE IOHEXOL 300 MG/ML SOLN  COMPARISON:  None.  FINDINGS: Lower chest: Lung bases are clear.  Hepatobiliary: No focal hepatic lesion. No biliary duct dilatation. Gallbladder is normal. Common bile duct is normal.  Pancreas: Pancreas is normal. No ductal dilatation. No pancreatic inflammation.  Spleen: Normal spleen  Adrenals/urinary tract: Bilateral adrenal adenomas. The largest adenoma is on the left measuring 29 mm. Bilateral simple fluid attenuation renal lesions. No  ureterolithiasis or obstructive uropathy. Bladder is normal  Stomach/Bowel: Stomach, small bowel, appendix, and cecum are normal. The colon and rectosigmoid colon are normal.  Vascular/Lymphatic: Abdominal aorta is normal caliber. There is no retroperitoneal or periportal lymphadenopathy. No pelvic lymphadenopathy.  Reproductive: Prostate gland is normal.  Musculoskeletal: No aggressive osseous lesion. Posterior lumbar fusion. There is stranding in the subcutaneous fat posterior to the fusion site suggesting recent surgery  Other: No inguinal hernia or ventral hernia.  IMPRESSION: 1. No pelvic or abdominal hernia. 2. Posterior lumbar fusion without complication. 3. Bilateral adrenal adenomas.   Electronically Signed   By: Suzy Bouchard M.D.   On: 03/11/2015 16:17     EKG Interpretation   Date/Time:  Thursday March 11 2015 14:28:56 EDT Ventricular Rate:  48 PR Interval:  195 QRS Duration: 100 QT Interval:  496 QTC Calculation: 443 R Axis:   -2 Text Interpretation:  Sinus bradycardia Borderline T abnormalities,  diffuse leads Confirmed by Starla Deller  MD, Jahaziel Francois [phone]) on 03/11/2015  2:40:20 PM      MDM   Final diagnoses:  Abdominal pain    Workup of the umbilical and left lower quadrant abdominal pain without any significant findings. No evidence of any bowel structure in or recurrent hernia. No evidence of diverticulitis. Patient's labs without any significant abnormalities. No leukocytosis or analysis negative for urinary tract infection liver function tests and electrolytes without any significant Shawn Route. Will treat symptomatically and have patient follow back up with his primary care doctor. If symptoms persist would recommend colonoscopy.    Fredia Sorrow, MD 03/11/15 [phone]

## 2015-03-26 ENCOUNTER — Ambulatory Visit
Admission: RE | Admit: 2015-03-26 | Discharge: 2015-03-26 | Disposition: A | Discharge status: Home / Self Care | Payer: Medicare HMO | Source: Ambulatory Visit | Attending: Neurosurgery | Admitting: Neurosurgery

## 2015-03-26 DIAGNOSIS — M5126 Other intervertebral disc displacement, lumbar region: Secondary | ICD-10-CM

## 2015-03-26 MED ORDER — GADOBENATE DIMEGLUMINE 529 MG/ML IV SOLN
20.0000 mL | Freq: Once | INTRAVENOUS | Status: AC | PRN
  Administered 2015-03-26: 20 mL via INTRAVENOUS

## 2015-05-04 ENCOUNTER — Other Ambulatory Visit: Payer: Self-pay | Admitting: Neurosurgery

## 2015-05-24 ENCOUNTER — Other Ambulatory Visit (HOSPITAL_COMMUNITY): Payer: Self-pay | Admitting: *Deleted

## 2015-05-24 ENCOUNTER — Inpatient Hospital Stay (HOSPITAL_COMMUNITY)
Admission: RE | Admit: 2015-05-24 | Discharge: 2015-05-24 | Disposition: A | Discharge status: Home / Self Care | Payer: Medicare HMO | Source: Ambulatory Visit

## 2015-05-24 NOTE — Pre-Procedure Instructions (Signed)
Gregory Diaz  05/24/2015      Your procedure is scheduled on Monday, May 31, 2015 at 9:30 AM.   Report to [facility] Entrance "A" Admitting Office at 7:30 AM.   Call this number if you have problems the morning of surgery: [phone]   Any questions prior to day of surgery, please call [phone] between 8 & 4 PM.    Remember:  Do not eat food or drink liquids after midnight Sunday, 05/30/15.  Take these medicines the morning of surgery with A SIP OF WATER:   Do not wear jewelry.  Do not wear lotions, powders, or cologne.  You may wear deodorant.  Men may shave face and neck.  Do not bring valuables to the hospital.  [facility] is not responsible for any belongings or valuables.  Contacts, dentures or bridgework may not be worn into surgery.  Leave your suitcase in the car.  After surgery it may be brought to your room.  For patients admitted to the hospital, discharge time will be determined by your treatment team.  Special instructions:   - Preparing for Surgery  Before surgery, you can play an important role.  Because skin is not sterile, your skin needs to be as free of germs as possible.  You can reduce the number of germs on you skin by washing with CHG (chlorahexidine gluconate) soap before surgery.  CHG is an antiseptic cleaner which kills germs and bonds with the skin to continue killing germs even after washing.  Please DO NOT use if you have an allergy to CHG or antibacterial soaps.  If your skin becomes reddened/irritated stop using the CHG and inform your nurse when you arrive at Short Stay.  Do not shave (including legs and underarms) for at least 48 hours prior to the first CHG shower.  You may shave your face.  Please follow these instructions carefully:   1.  Shower with CHG Soap the night before surgery and the                                morning of Surgery.  2.  If you choose to wash your hair, wash your hair first as usual  with your       normal shampoo.  3.  After you shampoo, rinse your hair and body thoroughly to remove the                      Shampoo.  4.  Use CHG as you would any other liquid soap.  You can apply chg directly       to the skin and wash gently with scrungie or a clean washcloth.  5.  Apply the CHG Soap to your body ONLY FROM THE NECK DOWN.        Do not use on open wounds or open sores.  Avoid contact with your eyes, ears, mouth and genitals (private parts).  Wash genitals (private parts) with your normal soap.  6.  Wash thoroughly, paying special attention to the area where your surgery        will be performed.  7.  Thoroughly rinse your body with warm water from the neck down.  8.  DO NOT shower/wash with your normal soap after using and rinsing off       the CHG Soap.  9.  Pat yourself dry with a  clean towel.            10.  Wear clean pajamas.            11.  Place clean sheets on your bed the night of your first shower and do not        sleep with pets.  Day of Surgery  Do not apply any lotions the morning of surgery.  Please wear clean clothes to the hospital.    Please read over the following fact sheets that you were given. Pain Booklet, Coughing and Deep Breathing, Blood Transfusion Information, MRSA Information and Surgical Site Infection Prevention

## 2015-05-25 ENCOUNTER — Encounter (HOSPITAL_COMMUNITY)
Admission: RE | Admit: 2015-05-25 | Discharge: 2015-05-25 | Disposition: A | Discharge status: Home / Self Care | Payer: Medicare HMO | Source: Ambulatory Visit | Attending: Neurosurgery | Admitting: Neurosurgery

## 2015-05-25 ENCOUNTER — Encounter (HOSPITAL_COMMUNITY): Payer: Self-pay

## 2015-05-25 DIAGNOSIS — Z01818 Encounter for other preprocedural examination: Secondary | ICD-10-CM | POA: Diagnosis not present

## 2015-05-25 DIAGNOSIS — Z01812 Encounter for preprocedural laboratory examination: Secondary | ICD-10-CM | POA: Diagnosis not present

## 2015-05-25 DIAGNOSIS — Z0183 Encounter for blood typing: Secondary | ICD-10-CM | POA: Insufficient documentation

## 2015-05-25 DIAGNOSIS — I10 Essential (primary) hypertension: Secondary | ICD-10-CM | POA: Insufficient documentation

## 2015-05-25 DIAGNOSIS — Z79899 Other long term (current) drug therapy: Secondary | ICD-10-CM | POA: Insufficient documentation

## 2015-05-25 LAB — BASIC METABOLIC PANEL
Anion gap: 9 (ref 5–15)
BUN: 18 mg/dL (ref 6–20)
CO2: 24 mmol/L (ref 22–32)
Calcium: 8.8 mg/dL — ABNORMAL LOW (ref 8.9–10.3)
Chloride: 106 mmol/L (ref 101–111)
Creatinine, Ser: 1.07 mg/dL (ref 0.61–1.24)
GFR calc Af Amer: >60 mL/min (ref >60)
GFR calc non Af Amer: >60 mL/min (ref >60)
Glucose, Bld: 109 mg/dL — ABNORMAL HIGH (ref 65–99)
Potassium: 4.1 mmol/L (ref 3.5–5.1)
Sodium: 139 mmol/L (ref 135–145)

## 2015-05-25 LAB — CBC
HCT: 46.8 % (ref 39.0–52.0)
Hemoglobin: 15.2 g/dL (ref 13.0–17.0)
MCH: 28 pg (ref 26.0–34.0)
MCHC: 32.5 g/dL (ref 30.0–36.0)
MCV: 86.3 fL (ref 78.0–100.0)
Platelets: 263 10*3/uL (ref 150–400)
RBC: 5.42 MIL/uL (ref 4.22–5.81)
RDW: 12.8 % (ref 11.5–15.5)
WBC: 8.1 10*3/uL (ref 4.0–10.5)

## 2015-05-25 LAB — TYPE AND SCREEN
ABO/RH(D): O POS
Antibody Screen: NEGATIVE

## 2015-05-25 LAB — SURGICAL PCR SCREEN
MRSA, PCR: NEGATIVE
Staphylococcus aureus: NEGATIVE

## 2015-05-25 NOTE — Progress Notes (Signed)
Call to Dr. Windy Carina office for him to sign orders.

## 2015-05-25 NOTE — Progress Notes (Signed)
Pt. Reports prior to last surgery he was sent for a cardiology eval. But " you won't find it because I know there was nothing wrong with my heart " , "that's what they told me". Pt. States that he was seen at [facility].

## 2015-05-25 NOTE — Pre-Procedure Instructions (Signed)
Gregory Diaz  05/25/2015      Your procedure is scheduled on Monday, May 31, 2015 at 9:30 AM.   Report to [facility] Entrance "A" Admitting Office at 7:30 AM.   Call this number if you have problems the morning of surgery: [phone]   Any questions prior to day of surgery, please call [phone] between 8 & 4 PM.    Remember:  Do not eat food or drink liquids after midnight Sunday, 05/30/15.  Take these medicines the morning of surgery with A SIP OF WATER: take pain medicine if needed the a.m. Of surgery   Do not wear jewelry.  Do not wear lotions, powders, or cologne.  You may wear deodorant.  Men may shave face and neck.  Do not bring valuables to the hospital.  [facility] is not responsible for any belongings or valuables.  Contacts, dentures or bridgework may not be worn into surgery.  Leave your suitcase in the car.  After surgery it may be brought to your room.  For patients admitted to the hospital, discharge time will be determined by your treatment team.  Special instructions:  [facility] - Preparing for Surgery  Before surgery, you can play an important role.  Because skin is not sterile, your skin needs to be as free of germs as possible.  You can reduce the number of germs on you skin by washing with CHG (chlorahexidine gluconate) soap before surgery.  CHG is an antiseptic cleaner which kills germs and bonds with the skin to continue killing germs even after washing.  Please DO NOT use if you have an allergy to CHG or antibacterial soaps.  If your skin becomes reddened/irritated stop using the CHG and inform your nurse when you arrive at Short Stay.  Do not shave (including legs and underarms) for at least 48 hours prior to the first CHG shower.  You may shave your face.  Please follow these instructions carefully:   1.  Shower with CHG Soap the night before surgery and the                                morning of Surgery.  2.  If you choose  to wash your hair, wash your hair first as usual with your       normal shampoo.  3.  After you shampoo, rinse your hair and body thoroughly to remove the                      Shampoo.  4.  Use CHG as you would any other liquid soap.  You can apply chg directly       to the skin and wash gently with scrungie or a clean washcloth.  5.  Apply the CHG Soap to your body ONLY FROM THE NECK DOWN.        Do not use on open wounds or open sores.  Avoid contact with your eyes, ears, mouth and genitals (private parts).  Wash genitals (private parts) with your normal soap.  6.  Wash thoroughly, paying special attention to the area where your surgery        will be performed.  7.  Thoroughly rinse your body with warm water from the neck down.  8.  DO NOT shower/wash with your normal soap after using and rinsing off       the CHG  Soap.  9.  Pat yourself dry with a clean towel.            10.  Wear clean pajamas.            11.  Place clean sheets on your bed the night of your first shower and do not        sleep with pets.  Day of Surgery  Do not apply any lotions the morning of surgery.  Please wear clean clothes to the hospital.    Please read over the following fact sheets that you were given. Pain Booklet, Coughing and Deep Breathing, Blood Transfusion Information, MRSA Information and Surgical Site Infection Prevention

## 2015-05-25 NOTE — Progress Notes (Signed)
Call to Pharm. Tech for RX reconciliation

## 2015-05-25 NOTE — Progress Notes (Signed)
   05/25/15 1112  OBSTRUCTIVE SLEEP APNEA  Have you ever been diagnosed with sleep apnea through a sleep study? No  Do you snore loudly (loud enough to be heard through closed doors)?  1  Do you often feel tired, fatigued, or sleepy during the daytime? 1  Has anyone observed you stop breathing during your sleep? 0  Do you have, or are you being treated for high blood pressure? 1  BMI more than 35 kg/m2? 1  Age over 66 years old? 1  Neck circumference greater than 40 cm/16 inches? 1  Gender: 1

## 2015-05-26 LAB — ABO/RH: ABO/RH(D): O POS

## 2015-05-26 NOTE — Progress Notes (Signed)
Anesthesia Chart Review: Patient is a 64 year old male posted for PLIF L3-4 exploration of fusion and extension from L4-S1 on 05/31/15 by Dr. Saintclair Halsted.   History includes smoking, HTN, BPH, murmur (diagnosed at age 11, no problems), BPH, anxiety, cataracts, back surgery, tonsillectomy, UHR, L4-S1 PLIF 10/19/14. PCP is Dr. Orson Ape. Prior to his last back surgery, he was referred to cardiologist Dr. Almira Coaster [facility]) for a preoperative evaluation due to abnormal EKG. Dr. Hamilton Capri ultimately cleared patient for back surgery after his 06/2014 stress test (see below).   Meds include Norco, Zestoretic.  03/11/15 EKG: SB at 48 bpm, inferolateral T wave abnormality, consider ischemia. T wave abnormality appears stable when compared to 06/09/14 EKG. HR is slower and PR interval has decreased since last tracing.  06/25/14 Nuclear stress test [facility]; scanned under Media tab): There was a small, fixed defect in the basal inferior, mid inferior segment(s). The extent of this defect was mild. Conclusion: Probably normal LV perfusion. Global left ventricular systolic function was , with an EF of 46%.  CXR on 06/09/14: No active cardiopulmonary disease.  Preoperative labs noted.   He had a stress test within the past year and has since tolerated lumbar fusion. If no acute changes then I anticipate that he can proceed as planned.    George Hugh [facility] Short Stay Center/Anesthesiology Phone [phone] 05/26/2015 1:14 PM

## 2015-05-30 MED ORDER — VANCOMYCIN HCL 10 G IV SOLR
1500.0000 mg | INTRAVENOUS | Status: AC
  Administered 2015-05-31: 1500 mg via INTRAVENOUS
  Filled 2015-05-30: qty 1500

## 2015-05-31 ENCOUNTER — Inpatient Hospital Stay (HOSPITAL_COMMUNITY): Payer: Medicare HMO | Admitting: Certified Registered"

## 2015-05-31 ENCOUNTER — Encounter (HOSPITAL_COMMUNITY)
Admission: RE | Disposition: A | Discharge status: Home / Self Care | Payer: Medicare HMO | Source: Ambulatory Visit | Attending: Neurosurgery

## 2015-05-31 ENCOUNTER — Inpatient Hospital Stay (HOSPITAL_COMMUNITY)
Admission: RE | Admit: 2015-05-31 | Discharge: 2015-06-02 | DRG: 460 | Disposition: A | Discharge status: Home / Self Care | Payer: Medicare HMO | Source: Ambulatory Visit | Attending: Neurosurgery | Admitting: Neurosurgery

## 2015-05-31 ENCOUNTER — Inpatient Hospital Stay (HOSPITAL_COMMUNITY): Payer: Medicare HMO

## 2015-05-31 ENCOUNTER — Encounter (HOSPITAL_COMMUNITY): Payer: Self-pay | Admitting: Certified Registered"

## 2015-05-31 ENCOUNTER — Inpatient Hospital Stay (HOSPITAL_COMMUNITY): Payer: Medicare HMO | Admitting: Vascular Surgery

## 2015-05-31 DIAGNOSIS — F17210 Nicotine dependence, cigarettes, uncomplicated: Secondary | ICD-10-CM | POA: Diagnosis present

## 2015-05-31 DIAGNOSIS — M5116 Intervertebral disc disorders with radiculopathy, lumbar region: Principal | ICD-10-CM | POA: Diagnosis present

## 2015-05-31 DIAGNOSIS — M4806 Spinal stenosis, lumbar region: Secondary | ICD-10-CM | POA: Diagnosis present

## 2015-05-31 DIAGNOSIS — Z79899 Other long term (current) drug therapy: Secondary | ICD-10-CM | POA: Diagnosis not present

## 2015-05-31 DIAGNOSIS — Z880 Allergy status to penicillin: Secondary | ICD-10-CM

## 2015-05-31 DIAGNOSIS — I10 Essential (primary) hypertension: Secondary | ICD-10-CM | POA: Diagnosis present

## 2015-05-31 DIAGNOSIS — M5126 Other intervertebral disc displacement, lumbar region: Secondary | ICD-10-CM | POA: Diagnosis present

## 2015-05-31 DIAGNOSIS — M549 Dorsalgia, unspecified: Secondary | ICD-10-CM | POA: Diagnosis present

## 2015-05-31 DIAGNOSIS — Z79891 Long term (current) use of opiate analgesic: Secondary | ICD-10-CM

## 2015-05-31 DIAGNOSIS — Z419 Encounter for procedure for purposes other than remedying health state, unspecified: Secondary | ICD-10-CM

## 2015-05-31 SURGERY — POSTERIOR LUMBAR FUSION 1 LEVEL
Anesthesia: General | Site: Back

## 2015-05-31 MED ORDER — SODIUM CHLORIDE 0.9 % IJ SOLN
INTRAMUSCULAR | Status: AC
  Filled 2015-05-31: qty 20

## 2015-05-31 MED ORDER — MENTHOL 3 MG MT LOZG
1.0000 | LOZENGE | OROMUCOSAL | Status: DC | PRN
  Administered 2015-05-31: 3 mg via ORAL
  Filled 2015-05-31 (×2): qty 9

## 2015-05-31 MED ORDER — PROPOFOL 10 MG/ML IV BOLUS
INTRAVENOUS | Status: AC
  Filled 2015-05-31: qty 20

## 2015-05-31 MED ORDER — ONDANSETRON HCL 4 MG/2ML IJ SOLN
4.0000 mg | INTRAMUSCULAR | Status: DC | PRN
Start: 2015-05-31 — End: 2015-06-02

## 2015-05-31 MED ORDER — LISINOPRIL-HYDROCHLOROTHIAZIDE 20-12.5 MG PO TABS: 1.0000 | ORAL_TABLET | Freq: Every day | ORAL | Status: DC

## 2015-05-31 MED ORDER — FENTANYL CITRATE (PF) 250 MCG/5ML IJ SOLN
INTRAMUSCULAR | Status: AC
  Filled 2015-05-31: qty 5

## 2015-05-31 MED ORDER — MIDAZOLAM HCL 2 MG/2ML IJ SOLN: 0.5000 mg | Freq: Once | INTRAMUSCULAR | Status: DC | PRN

## 2015-05-31 MED ORDER — ROCURONIUM BROMIDE 50 MG/5ML IV SOLN
INTRAVENOUS | Status: AC
Start: 2015-05-31 — End: 2015-05-31
  Filled 2015-05-31: qty 1

## 2015-05-31 MED ORDER — OXYCODONE-ACETAMINOPHEN 5-325 MG PO TABS
1.0000 | ORAL_TABLET | ORAL | Status: DC | PRN
  Administered 2015-05-31 – 2015-06-01 (×6): 2 via ORAL
  Administered 2015-06-01: 1 via ORAL
  Administered 2015-06-02: 2 via ORAL
  Administered 2015-06-02: 1 via ORAL
  Filled 2015-05-31 (×6): qty 2
  Filled 2015-05-31: qty 1
  Filled 2015-05-31 (×2): qty 2

## 2015-05-31 MED ORDER — HYDROCODONE-ACETAMINOPHEN 5-325 MG PO TABS: 1.0000 | ORAL_TABLET | Freq: Four times a day (QID) | ORAL | Status: DC | PRN

## 2015-05-31 MED ORDER — VECURONIUM BROMIDE 10 MG IV SOLR
INTRAVENOUS | Status: DC | PRN
  Administered 2015-05-31 (×2): 1 mg via INTRAVENOUS
  Administered 2015-05-31: 3 mg via INTRAVENOUS
  Administered 2015-05-31: 2 mg via INTRAVENOUS

## 2015-05-31 MED ORDER — ACETAMINOPHEN 325 MG PO TABS: 650.0000 mg | ORAL_TABLET | ORAL | Status: DC | PRN

## 2015-05-31 MED ORDER — PHENYLEPHRINE HCL 10 MG/ML IJ SOLN
10.0000 mg | INTRAVENOUS | Status: DC | PRN
  Administered 2015-05-31: 30 ug/min via INTRAVENOUS

## 2015-05-31 MED ORDER — PHENYLEPHRINE HCL 10 MG/ML IJ SOLN
INTRAMUSCULAR | Status: DC | PRN
  Administered 2015-05-31: 40 ug via INTRAVENOUS
  Administered 2015-05-31: 80 ug via INTRAVENOUS
  Administered 2015-05-31: 40 ug via INTRAVENOUS
  Administered 2015-05-31: 80 ug via INTRAVENOUS

## 2015-05-31 MED ORDER — LIDOCAINE HCL (CARDIAC) 20 MG/ML IV SOLN
INTRAVENOUS | Status: DC | PRN
  Administered 2015-05-31: 20 mg via INTRAVENOUS

## 2015-05-31 MED ORDER — LACTATED RINGERS IV SOLN
INTRAVENOUS | Status: DC
  Administered 2015-05-31 (×4): via INTRAVENOUS

## 2015-05-31 MED ORDER — GLYCOPYRROLATE 0.2 MG/ML IJ SOLN
INTRAMUSCULAR | Status: DC | PRN
  Administered 2015-05-31: 0.6 mg via INTRAVENOUS
  Administered 2015-05-31: 0.2 mg via INTRAVENOUS

## 2015-05-31 MED ORDER — VANCOMYCIN HCL 1000 MG IV SOLR
INTRAVENOUS | Status: AC
  Filled 2015-05-31: qty 1000

## 2015-05-31 MED ORDER — GLYCOPYRROLATE 0.2 MG/ML IJ SOLN
INTRAMUSCULAR | Status: AC
  Filled 2015-05-31: qty 3

## 2015-05-31 MED ORDER — DEXAMETHASONE SODIUM PHOSPHATE 10 MG/ML IJ SOLN
INTRAMUSCULAR | Status: AC
  Filled 2015-05-31: qty 1

## 2015-05-31 MED ORDER — GLYCOPYRROLATE 0.2 MG/ML IJ SOLN
INTRAMUSCULAR | Status: AC
  Filled 2015-05-31: qty 1

## 2015-05-31 MED ORDER — ARTIFICIAL TEARS OP OINT
TOPICAL_OINTMENT | OPHTHALMIC | Status: AC
Start: 2015-05-31 — End: 2015-05-31
  Filled 2015-05-31: qty 3.5

## 2015-05-31 MED ORDER — FENTANYL CITRATE (PF) 100 MCG/2ML IJ SOLN
INTRAMUSCULAR | Status: DC | PRN
  Administered 2015-05-31 (×2): 50 ug via INTRAVENOUS
  Administered 2015-05-31: 250 ug via INTRAVENOUS
  Administered 2015-05-31 (×3): 50 ug via INTRAVENOUS

## 2015-05-31 MED ORDER — 0.9 % SODIUM CHLORIDE (POUR BTL) OPTIME
TOPICAL | Status: DC | PRN
  Administered 2015-05-31: 1000 mL

## 2015-05-31 MED ORDER — ONDANSETRON HCL 4 MG/2ML IJ SOLN
INTRAMUSCULAR | Status: AC
  Filled 2015-05-31: qty 2

## 2015-05-31 MED ORDER — DOCUSATE SODIUM 100 MG PO CAPS
100.0000 mg | ORAL_CAPSULE | Freq: Two times a day (BID) | ORAL | Status: DC
  Administered 2015-05-31 – 2015-06-01 (×3): 100 mg via ORAL
  Filled 2015-05-31 (×4): qty 1

## 2015-05-31 MED ORDER — ONDANSETRON HCL 4 MG/2ML IJ SOLN
INTRAMUSCULAR | Status: DC | PRN
  Administered 2015-05-31: 4 mg via INTRAVENOUS

## 2015-05-31 MED ORDER — BUPIVACAINE HCL (PF) 0.25 % IJ SOLN
INTRAMUSCULAR | Status: DC | PRN
  Administered 2015-05-31: 10 mL

## 2015-05-31 MED ORDER — SODIUM CHLORIDE 0.9 % IJ SOLN
INTRAMUSCULAR | Status: AC
  Filled 2015-05-31: qty 10

## 2015-05-31 MED ORDER — VANCOMYCIN HCL 1000 MG IV SOLR
INTRAVENOUS | Status: DC | PRN
  Administered 2015-05-31: 1000 mg via TOPICAL

## 2015-05-31 MED ORDER — ROCURONIUM BROMIDE 100 MG/10ML IV SOLN
INTRAVENOUS | Status: DC | PRN
  Administered 2015-05-31: 50 mg via INTRAVENOUS

## 2015-05-31 MED ORDER — SODIUM CHLORIDE 0.9 % IV SOLN
250.0000 mL | INTRAVENOUS | Status: DC
Start: 2015-05-31 — End: 2015-06-02

## 2015-05-31 MED ORDER — VECURONIUM BROMIDE 10 MG IV SOLR
INTRAVENOUS | Status: AC
  Filled 2015-05-31: qty 10

## 2015-05-31 MED ORDER — HYDROMORPHONE HCL 1 MG/ML IJ SOLN: 0.5000 mg | INTRAMUSCULAR | Status: DC | PRN

## 2015-05-31 MED ORDER — SODIUM CHLORIDE 0.9 % IJ SOLN: 3.0000 mL | INTRAMUSCULAR | Status: DC | PRN

## 2015-05-31 MED ORDER — DEXAMETHASONE SODIUM PHOSPHATE 10 MG/ML IJ SOLN
10.0000 mg | INTRAMUSCULAR | Status: AC
  Administered 2015-05-31: 10 mg via INTRAVENOUS

## 2015-05-31 MED ORDER — THROMBIN 20000 UNITS EX SOLR
CUTANEOUS | Status: DC | PRN
  Administered 2015-05-31: 20 mL via TOPICAL

## 2015-05-31 MED ORDER — EPHEDRINE SULFATE 50 MG/ML IJ SOLN
INTRAMUSCULAR | Status: AC
  Filled 2015-05-31: qty 1

## 2015-05-31 MED ORDER — PROPOFOL 10 MG/ML IV BOLUS
INTRAVENOUS | Status: DC | PRN
  Administered 2015-05-31: 150 mg via INTRAVENOUS

## 2015-05-31 MED ORDER — ACETAMINOPHEN 500 MG PO TABS: 500.0000 mg | ORAL_TABLET | Freq: Four times a day (QID) | ORAL | Status: DC | PRN

## 2015-05-31 MED ORDER — CYCLOBENZAPRINE HCL 10 MG PO TABS
10.0000 mg | ORAL_TABLET | Freq: Three times a day (TID) | ORAL | Status: DC | PRN
  Administered 2015-06-01 – 2015-06-02 (×3): 10 mg via ORAL
  Filled 2015-05-31 (×3): qty 1

## 2015-05-31 MED ORDER — PROMETHAZINE HCL 25 MG/ML IJ SOLN: 6.2500 mg | INTRAMUSCULAR | Status: DC | PRN

## 2015-05-31 MED ORDER — CEFAZOLIN SODIUM-DEXTROSE 2-3 GM-% IV SOLR
2.0000 g | Freq: Three times a day (TID) | INTRAVENOUS | Status: DC
Start: 2015-05-31 — End: 2015-05-31

## 2015-05-31 MED ORDER — PHENOL 1.4 % MT LIQD: 1.0000 | OROMUCOSAL | Status: DC | PRN

## 2015-05-31 MED ORDER — LIDOCAINE-EPINEPHRINE 1 %-1:100000 IJ SOLN
INTRAMUSCULAR | Status: DC | PRN
  Administered 2015-05-31: 10 mL

## 2015-05-31 MED ORDER — SODIUM CHLORIDE 0.9 % IJ SOLN
3.0000 mL | Freq: Two times a day (BID) | INTRAMUSCULAR | Status: DC
  Administered 2015-05-31: 3 mL via INTRAVENOUS

## 2015-05-31 MED ORDER — HYDROCHLOROTHIAZIDE 12.5 MG PO CAPS
12.5000 mg | ORAL_CAPSULE | Freq: Every day | ORAL | Status: DC
  Administered 2015-05-31 – 2015-06-02 (×3): 12.5 mg via ORAL
  Filled 2015-05-31 (×3): qty 1

## 2015-05-31 MED ORDER — BUPIVACAINE LIPOSOME 1.3 % IJ SUSP
INTRAMUSCULAR | Status: DC | PRN
  Administered 2015-05-31: 20 mL

## 2015-05-31 MED ORDER — ACETAMINOPHEN 650 MG RE SUPP: 650.0000 mg | RECTAL | Status: DC | PRN

## 2015-05-31 MED ORDER — ARTIFICIAL TEARS OP OINT
TOPICAL_OINTMENT | OPHTHALMIC | Status: DC | PRN
Start: 2015-05-31 — End: 2015-05-31
  Administered 2015-05-31: 1 via OPHTHALMIC

## 2015-05-31 MED ORDER — MIDAZOLAM HCL 2 MG/2ML IJ SOLN
INTRAMUSCULAR | Status: AC
  Filled 2015-05-31: qty 2

## 2015-05-31 MED ORDER — NEOSTIGMINE METHYLSULFATE 10 MG/10ML IV SOLN
INTRAVENOUS | Status: DC | PRN
  Administered 2015-05-31: 4 mg via INTRAVENOUS

## 2015-05-31 MED ORDER — LISINOPRIL 20 MG PO TABS
20.0000 mg | ORAL_TABLET | Freq: Every day | ORAL | Status: DC
  Administered 2015-05-31 – 2015-06-02 (×3): 20 mg via ORAL
  Filled 2015-05-31 (×3): qty 1

## 2015-05-31 MED ORDER — EPHEDRINE SULFATE 50 MG/ML IJ SOLN
INTRAMUSCULAR | Status: DC | PRN
  Administered 2015-05-31: 10 mg via INTRAVENOUS

## 2015-05-31 MED ORDER — VANCOMYCIN HCL 10 G IV SOLR
1500.0000 mg | Freq: Two times a day (BID) | INTRAVENOUS | Status: AC
  Administered 2015-05-31 – 2015-06-01 (×2): 1500 mg via INTRAVENOUS
  Filled 2015-05-31 (×2): qty 1500

## 2015-05-31 MED ORDER — MEPERIDINE HCL 25 MG/ML IJ SOLN: 6.2500 mg | INTRAMUSCULAR | Status: DC | PRN

## 2015-05-31 MED ORDER — MIDAZOLAM HCL 5 MG/5ML IJ SOLN
INTRAMUSCULAR | Status: DC | PRN
  Administered 2015-05-31: 2 mg via INTRAVENOUS

## 2015-05-31 MED ORDER — SODIUM CHLORIDE 0.9 % IR SOLN
Status: DC | PRN
  Administered 2015-05-31: 500 mL

## 2015-05-31 MED ORDER — NEOSTIGMINE METHYLSULFATE 10 MG/10ML IV SOLN
INTRAVENOUS | Status: AC
  Filled 2015-05-31: qty 1

## 2015-05-31 MED ORDER — HYDROMORPHONE HCL 1 MG/ML IJ SOLN: 0.2500 mg | INTRAMUSCULAR | Status: DC | PRN

## 2015-05-31 MED ORDER — BUPIVACAINE LIPOSOME 1.3 % IJ SUSP
20.0000 mL | INTRAMUSCULAR | Status: AC
  Filled 2015-05-31: qty 20

## 2015-05-31 SURGICAL SUPPLY — 68 items
ALLOSTEM STRIP 20MMX50MM (Tissue) ×2 IMPLANT
BAG DECANTER FOR FLEXI CONT (MISCELLANEOUS) ×2 IMPLANT
BENZOIN TINCTURE PRP APPL 2/3 (GAUZE/BANDAGES/DRESSINGS) ×2 IMPLANT
BIT DRILL 5.0/4.0 (BIT) ×1 IMPLANT
BLADE CLIPPER SURG (BLADE) IMPLANT
BLADE SURG 11 STRL SS (BLADE) ×2 IMPLANT
BONE ALLOSTEM MORSELIZED 5CC (Bone Implant) ×2 IMPLANT
BRUSH SCRUB EZ PLAIN DRY (MISCELLANEOUS) ×2 IMPLANT
BUR MATCHSTICK NEURO 3.0 LAGG (BURR) ×2 IMPLANT
BUR PRECISION FLUTE 6.0 (BURR) ×2 IMPLANT
CAGE RISE 11-17-15 10X26 (Cage) ×4 IMPLANT
CANISTER SUCT 3000ML PPV (MISCELLANEOUS) ×2 IMPLANT
CAP LOCKING (Cap) ×7 IMPLANT
CAP LOCKING 5.5 CREO (Cap) ×7 IMPLANT
CONT SPEC 4OZ CLIKSEAL STRL BL (MISCELLANEOUS) ×4 IMPLANT
COVER BACK TABLE 60X90IN (DRAPES) ×2 IMPLANT
DECANTER SPIKE VIAL GLASS SM (MISCELLANEOUS) ×2 IMPLANT
DRAPE C-ARM 42X72 X-RAY (DRAPES) ×2 IMPLANT
DRAPE C-ARMOR (DRAPES) ×2 IMPLANT
DRAPE LAPAROTOMY 100X72X124 (DRAPES) ×2 IMPLANT
DRAPE POUCH INSTRU U-SHP 10X18 (DRAPES) ×2 IMPLANT
DRAPE PROXIMA HALF (DRAPES) ×2 IMPLANT
DRAPE SURG 17X23 STRL (DRAPES) ×2 IMPLANT
DRILL 5.0/4.0 (BIT) ×2
DRSG OPSITE 4X5.5 SM (GAUZE/BANDAGES/DRESSINGS) ×4 IMPLANT
DRSG OPSITE POSTOP 4X8 (GAUZE/BANDAGES/DRESSINGS) ×2 IMPLANT
DURAPREP 26ML APPLICATOR (WOUND CARE) ×2 IMPLANT
ELECT REM PT RETURN 9FT ADLT (ELECTROSURGICAL) ×2
ELECTRODE REM PT RTRN 9FT ADLT (ELECTROSURGICAL) ×1 IMPLANT
EVACUATOR 3/16  PVC DRAIN (DRAIN) ×1
EVACUATOR 3/16 PVC DRAIN (DRAIN) ×1 IMPLANT
GAUZE SPONGE 4X4 12PLY STRL (GAUZE/BANDAGES/DRESSINGS) ×2 IMPLANT
GAUZE SPONGE 4X4 16PLY XRAY LF (GAUZE/BANDAGES/DRESSINGS) IMPLANT
GLOVE BIO SURGEON STRL SZ8 (GLOVE) ×4 IMPLANT
GLOVE ECLIPSE 7.5 STRL STRAW (GLOVE) IMPLANT
GLOVE EXAM NITRILE LRG STRL (GLOVE) IMPLANT
GLOVE EXAM NITRILE MD LF STRL (GLOVE) IMPLANT
GLOVE EXAM NITRILE XL STR (GLOVE) IMPLANT
GLOVE EXAM NITRILE XS STR PU (GLOVE) IMPLANT
GLOVE INDICATOR 8.5 STRL (GLOVE) ×4 IMPLANT
GOWN STRL REUS W/ TWL LRG LVL3 (GOWN DISPOSABLE) ×1 IMPLANT
GOWN STRL REUS W/ TWL XL LVL3 (GOWN DISPOSABLE) ×2 IMPLANT
GOWN STRL REUS W/TWL 2XL LVL3 (GOWN DISPOSABLE) IMPLANT
GOWN STRL REUS W/TWL LRG LVL3 (GOWN DISPOSABLE) ×1
GOWN STRL REUS W/TWL XL LVL3 (GOWN DISPOSABLE) ×2
KIT BASIN OR (CUSTOM PROCEDURE TRAY) ×2 IMPLANT
KIT ROOM TURNOVER OR (KITS) ×2 IMPLANT
LIQUID BAND (GAUZE/BANDAGES/DRESSINGS) ×2 IMPLANT
NEEDLE HYPO 25X1 1.5 SAFETY (NEEDLE) ×2 IMPLANT
NS IRRIG 1000ML POUR BTL (IV SOLUTION) ×2 IMPLANT
PACK LAMINECTOMY NEURO (CUSTOM PROCEDURE TRAY) ×2 IMPLANT
PAD ARMBOARD 7.5X6 YLW CONV (MISCELLANEOUS) ×6 IMPLANT
ROD 100MM (Rod) ×2 IMPLANT
ROD SPNL STRG 100X5.5XNS (Rod) ×2 IMPLANT
SCREW CREO SHAFT 6.5/5.0X40MM (Screw) ×4 IMPLANT
SPONGE LAP 4X18 X RAY DECT (DISPOSABLE) IMPLANT
SPONGE SURGIFOAM ABS GEL 100 (HEMOSTASIS) ×2 IMPLANT
STRIP CLOSURE SKIN 1/2X4 (GAUZE/BANDAGES/DRESSINGS) ×2 IMPLANT
SUT VIC AB 0 CT1 18XCR BRD8 (SUTURE) ×2 IMPLANT
SUT VIC AB 0 CT1 8-18 (SUTURE) ×2
SUT VIC AB 2-0 CT1 18 (SUTURE) ×2 IMPLANT
SUT VICRYL 4-0 PS2 18IN ABS (SUTURE) ×2 IMPLANT
SYR 20ML ECCENTRIC (SYRINGE) ×2 IMPLANT
TOWEL OR 17X24 6PK STRL BLUE (TOWEL DISPOSABLE) ×2 IMPLANT
TOWEL OR 17X26 10 PK STRL BLUE (TOWEL DISPOSABLE) ×2 IMPLANT
TRAY FOLEY W/METER SILVER 14FR (SET/KITS/TRAYS/PACK) ×2 IMPLANT
TULIP CREP AMP 5.5MM (Orthopedic Implant) ×4 IMPLANT
WATER STERILE IRR 1000ML POUR (IV SOLUTION) ×2 IMPLANT

## 2015-05-31 NOTE — Anesthesia Postprocedure Evaluation (Signed)
  Anesthesia Post-op Note  Patient: Gregory Diaz  Procedure(s) Performed: Procedure(s): PLIF - L3-L4 exploiration of fusion and extension from L4-S1 (N/A)  Patient Location: PACU  Anesthesia Type:General  Level of Consciousness: awake, alert , oriented and patient cooperative  Airway and Oxygen Therapy: Patient Spontanous Breathing and Patient connected to nasal cannula oxygen  Post-op Pain: mild  Post-op Assessment: Post-op Vital signs reviewed, Patient's Cardiovascular Status Stable, Respiratory Function Stable, Patent Airway, No signs of Nausea or vomiting and Pain level controlled LLE Motor Response: Responds to commands, Purposeful movement LLE Sensation: Full sensation RLE Motor Response: Responds to commands, Purposeful movement RLE Sensation: Full sensation      Post-op Vital Signs: Reviewed and stable  Last Vitals:  Filed Vitals:   05/31/15 1530  BP: 116/66  Pulse: 58  Temp:   Resp: 15    Complications: No apparent anesthesia complications

## 2015-05-31 NOTE — H&P (Signed)
Gregory Diaz is an 64 y.o. male.   Chief Complaint: Back and left leg pain HPI: Patient is a very pleasant 64 year old gentleman who previously undergone L4-S1 fusion initially did fairly well however in the last couple months is a progress worsening back and left leg pain workup and repeat imaging as shown what appears to be solid and progressive fusion from L4-S1 but progressive breakdownabove this at L3-4. And due to progression of disc is recurrence of left leg radicular pain and failed conservative treatment I recommended reexploration of fusion removal of hardware and extension up to include L3-4. I've extensively gone over the risks and benefits of an L3-4 interbody fusion with him he understands and agrees to proceed forward including perioperative course expectations of outcome and alternatives of surgery.  Past Medical History  Diagnosis Date  . Hypertension   . Heart murmur     was told at age 2, but has not had any problems  . Enlarged prostate   . Anxiety   . Early cataracts, bilateral   . Arthritis     DDD, low back    Past Surgical History  Procedure Laterality Date  . Hemorroidectomy    . Fracture surgery Right     thumb  . Colonoscopy    . Hernia repair      umbilical hernia repair  . Tonsillectomy    . Back surgery  2004, 2015  . Eye surgery      L eye- as a child     Family History  Problem Relation Age of Onset  . Gallstones Father   . Ulcers Father    Social History:  reports that he has been smoking Cigarettes.  He has been smoking about 1.00 pack per day. He has never used smokeless tobacco. He reports that he does not drink alcohol or use illicit drugs.  Allergies:  Allergies  Allergen Reactions  . Penicillins Swelling         Medications Prior to Admission  Medication Sig Dispense Refill  . acetaminophen (TYLENOL) 500 MG tablet Take 500-1,000 mg by mouth every 6 (six) hours as needed for mild pain, moderate pain or headache.     Marland Kitchen  HYDROcodone-acetaminophen (NORCO/VICODIN) 5-325 MG per tablet Take 1-2 tablets by mouth every 6 (six) hours as needed for moderate pain.    Marland Kitchen lisinopril-hydrochlorothiazide (PRINZIDE,ZESTORETIC) 20-12.5 MG per tablet Take 1 tablet by mouth daily.    . Multiple Vitamins-Minerals (MULTIVITAMIN & MINERAL PO) Take 1 tablet by mouth daily.      No results found for this or any previous visit (from the past 48 hour(s)). No results found.  Review of Systems  Constitutional: Negative.   HENT: Negative.   Eyes: Negative.   Respiratory: Negative.   Cardiovascular: Negative.   Musculoskeletal: Positive for myalgias and back pain.  Skin: Negative.   Neurological: Positive for dizziness, tingling and sensory change.    Blood pressure 132/90, pulse 51, temperature 98 F (36.7 C), temperature source Oral, height 6\' 2"  (1.88 m), weight 135.58 kg (298 lb 14.4 oz), SpO2 100 %. Physical Exam  Constitutional: He is oriented to person, place, and time. He appears well-developed and well-nourished.  HENT:  Head: Normocephalic.  Eyes: Pupils are equal, round, and reactive to light.  Neck: Normal range of motion.  Respiratory: Effort normal.  GI: Soft. Bowel sounds are normal.  Neurological: He is alert and oriented to person, place, and time.  Skin: Skin is warm and dry.  Assessment/Plan 55 year gentleman presents for an L3-4 posterior lumbar interbody fusion with an expiration of fusion removal of hardware L4-S1.  Janiece Scovill P 05/31/2015, 9:16 AM

## 2015-05-31 NOTE — Op Note (Signed)
Preoperative diagnosis: Herniated nuclear stenosis L3-4 and lumbar spinal stenosis L3-4 with left L4 radiculopathy  Postoperative diagnosis: Same plus loose versus malpositioned left L4 screw  Procedure: Decompressive lumbar laminectomy L3-4 and excess and requiring more work to would be needed with a standard interbody fusion with complete medial facetectomies radical foraminotomies of the L3 and L4 nerve roots.  #2 posterior lumbar interbody fusion L3-4 using globus expandable cages 10-17 15 degrees lordosis 26 mm in length packed with locally harvested autograft mixed with allostem morsels  #3 exploration of fusion removal of hardware L4-S1 with what appeared to be solid fusion from L4-S1 but some question of the placement and positioning of the left L4 screw with subsequent removal of the left L4 screw  #4 placement of a large Hemovac drain  Surgeon: Dominica Severin Jaclynn Laumann  Asst.: Mallie Mussel pool  Anesthesia: Gen.  EBL: 500  History of present illness: 64 year old gentleman status post L4-S1 fusion present with progressive worsening back and left leg pain workup show progression of disease at L3-4 above his 4-1 fusion with a disc herniation at that level and severe spinal stenosis. Workup also looked like the fusion was solid and all screws appeared to be well within the pedicle although the left L4 screw was close to the midline. So at the patient failed all forms of conservative treatment I recommended decompression stabilization procedure at L3-4 with an expiration of fusion evaluation of the hardware. I extensively the risks and benefits of the operation the patient as well as perioperative course expectations of outcome and alternatives of surgery and he understood and agreed to proceed forward.  Operative procedure: Patient brought into the or was induced on general seizure position prone the Wilson frame his back was prepped and draped in routine sterile fashion is old incision was opened up and  extended slightly cephalad scar tissues dissected off of the previous fusion hardware from L4-S1 was exposed. I also and subsequently exposed the facet complex and at L3 and 34 and expose the pars and the entry points for the pedicle screws at L3. I then removed the top tightening nuts and remove the rods the previous hardware all screws were felt to be solid although with the question of the left L4 screw coursing along the medial aspect pedicle I went ahead and remove this screw. Using, patient of AP and lateral fluoroscopy pilot holes were drilled at 5 and 7:00 position of the L3 pedicle both the holes were drilled tapped and probed and 6050 by 40 mm screws were inserted. Postop fluoroscopy confirmed good position of the screws. Began a decompression with complete central decompression with removal of the L3 spinous process and removed about 80% of the L3 lamina aggressive abutting laterally allowed complete medial facetectomies to performed at L3-4. Aggressive undercutting of both endplates and superior articulating facets a ladder axis lateral margins disc space both the L3 and L4 nerve roots are palpated identified I unroofed and extensive amount of the distal L4 foramen to decompress that nerve root felt the medial border the pedicle and although I felt confident there was a slight irregularity could've been a possible medial breach. Disc space was incised and cleanout bilaterally using sequential distraction leaving an 11 distractor in place I selected a 1017 expandable cage and the 6 mm in length packed with local autograft mixed with the morsels. Then after the contralateral side the disc space was cleaned out and central disc was removed with a large central fragment removed after adequate endplate preparation  of packed the autograft mixed centrally packed the contralateral cage expandable cages 3 Knapp turns and this opened up the disc space and decompressed both 30 and the 4 roots. Posterior fluoroscopy  confirmed good position of all the implants then selected 100 mm rod contoured and placed at all nuts were tightened down and torqued and placed all the foraminal reinspected confirm patency Gelfoam was laid over the dura large abductor was placed the wounds closed in layers with interrupted Vicryls skin was closed running 4 subcuticular Dermabond benzo and Steri-Strips were applied patient recovered in stable condition. At the end of case all needle counts sponge counts were correct.

## 2015-05-31 NOTE — Anesthesia Procedure Notes (Signed)
Procedure Name: Intubation Date/Time: 05/31/2015 9:40 AM Performed by: Gaylene Brooks Pre-anesthesia Checklist: Emergency Drugs available, Patient identified, Timeout performed, Suction available and Patient being monitored Patient Re-evaluated:Patient Re-evaluated prior to inductionOxygen Delivery Method: Circle system utilized Preoxygenation: Pre-oxygenation with 100% oxygen Intubation Type: IV induction Ventilation: Mask ventilation without difficulty and Oral airway inserted - appropriate to patient size Laryngoscope Size: Miller and 3 Grade View: Grade II Tube type: Oral Tube size: 7.5 mm Number of attempts: 1 Airway Equipment and Method: Stylet Placement Confirmation: ETT inserted through vocal cords under direct vision,  breath sounds checked- equal and bilateral,  positive ETCO2 and CO2 detector Secured at: 24 cm Tube secured with: Tape Dental Injury: Teeth and Oropharynx as per pre-operative assessment

## 2015-05-31 NOTE — Progress Notes (Signed)
Pt arrived from PACU at this time stable, drowsy with no noted distress. Neuro intact, he denies pain or discomfort. Surgical honeycomb and gauze dressing clean, dry and intact. Hemovac in place. Foley draining clear, yellow urine. IV infusing. SCD's in place.  Safety measures in place. Call bell within reach. Will continue to monitor.

## 2015-05-31 NOTE — Transfer of Care (Signed)
Immediate Anesthesia Transfer of Care Note  Patient: Gregory Diaz  Procedure(s) Performed: Procedure(s): PLIF - L3-L4 exploiration of fusion and extension from L4-S1 (N/A)  Patient Location: PACU  Anesthesia Type:General  Level of Consciousness: awake, alert , oriented and sedated  Airway & Oxygen Therapy: Patient Spontanous Breathing and Patient connected to face mask oxygen  Post-op Assessment: Report given to RN, Post -op Vital signs reviewed and stable and Patient moving all extremities X 4  Post vital signs: Reviewed and stable  Last Vitals:  Filed Vitals:   05/31/15 0741  BP: 132/90  Pulse: 51  Temp: 71.1 C    Complications: No apparent anesthesia complications

## 2015-05-31 NOTE — Anesthesia Preprocedure Evaluation (Addendum)
Anesthesia Evaluation  Patient identified by MRN, date of birth, ID band Patient awake    Reviewed: Allergy & Precautions, NPO status , Patient's Chart, lab work & pertinent test results  History of Anesthesia Complications Negative for: history of anesthetic complications  Airway Mallampati: II  TM Distance: >3 FB Neck ROM: Full    Dental  (+) Dental Advisory Given, Teeth Intact   Pulmonary COPDCurrent Smoker,  breath sounds clear to auscultation        Cardiovascular hypertension, Pt. on medications - anginaRhythm:Regular Rate:Normal  '15 stress test: "Probably normal LV perfusion. Global left ventricular systolic function was. With an EF of 46%."    Neuro/Psych Chronic back pain negative psych ROS   GI/Hepatic negative GI ROS, Neg liver ROS,   Endo/Other  Morbid obesity  Renal/GU negative Renal ROS     Musculoskeletal  (+) Arthritis -, Osteoarthritis,    Abdominal (+) + obese,   Peds  Hematology   Anesthesia Other Findings   Reproductive/Obstetrics                         Anesthesia Physical Anesthesia Plan  ASA: III  Anesthesia Plan: General   Post-op Pain Management:    Induction:   Airway Management Planned: Oral ETT  Additional Equipment:   Intra-op Plan:   Post-operative Plan: Extubation in OR  Informed Consent: I have reviewed the patients History and Physical, chart, labs and discussed the procedure including the risks, benefits and alternatives for the proposed anesthesia with the patient or authorized representative who has indicated his/her understanding and acceptance.   Dental advisory given  Plan Discussed with: CRNA and Surgeon  Anesthesia Plan Comments: (Plan routine monitors, GETA)        Anesthesia Quick Evaluation

## 2015-06-01 NOTE — Progress Notes (Signed)
Utilization review completed. Owen Pagnotta, RN, BSN. 

## 2015-06-01 NOTE — Progress Notes (Signed)
Patient without a brace. Family bring bring before lunch. Able to sit at bedside for bath at this time. Will ambulate once brace available.   Ave Filter, RN

## 2015-06-01 NOTE — Progress Notes (Signed)
Chaplain was referred by nurse, Chaplain introduced himself and invited the Pt to tell his story. 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 His son was in the room with the Pt. Chaplain prayed with family and they seem to enjoy the visit. Chaplain  will follow up.    06/01/15 1400  Clinical Encounter Type  Visited With Patient and family together  Visit Type Spiritual support  Referral From Nurse  Spiritual Encounters  Spiritual Needs Prayer;Emotional  Stress Factors  Patient Stress Factors Health changes

## 2015-06-01 NOTE — Progress Notes (Signed)
Subjective: Patient reports L no leg pain condition of back soreness  Objective: Vital signs in last 24 hours: Temp:  [97.7 F (36.5 C)-98.2 F (36.8 C)] 97.9 F (36.6 C) (07/19 0545) Pulse Rate:  [51-76] 59 (07/19 0545) Resp:  [12-26] 17 (07/19 0545) BP: (109-153)/(61-90) 153/81 mmHg (07/19 0545) SpO2:  [96 %-100 %] 100 % (07/19 0545) Weight:  [135.58 kg (298 lb 14.4 oz)-139.663 kg (307 lb 14.4 oz)] 139.663 kg (307 lb 14.4 oz) (07/18 1610)  Intake/Output from previous day: 07/18 0701 - 07/19 0700 In: 2655 [P.O.:240; I.V.:2100; Blood:115] Out: 1740 [Urine:3685; Drains:300; Blood:350] Intake/Output this shift:    Strength out of 5 wound clean dry and intact  Lab Results: No results for input(s): WBC, HGB, HCT, PLT in the last 72 hours. BMET No results for input(s): NA, K, CL, CO2, GLUCOSE, BUN, CREATININE, CALCIUM in the last 72 hours.  Studies/Results: Dg Lumbar Spine 2-3 Views  05/31/2015   CLINICAL DATA:  L3-4 extension fusion  EXAM: LUMBAR SPINE - 2-3 VIEW  COMPARISON:  MRI of the lumbar spine of Mar 26, 2015  FINDINGS: Fluoro time is reported as 40 seconds. 2 fluoro spot films, 1 AP, 1 lateral reveal the patient to be undergoing posterior fusion and intradiscal device placement at L3-4. Intradiscal devices and posterior fusion devices are present are preexistent at L4-5 and L5-S1. The connecting rod is been removed and L3 pedicle screws are being placed. The new connecting rods have not yet been placed. The intradiscal devices appear to be in normal position.  IMPRESSION: The patient has is undergoing inter discal device placement and extension of the fusion device to include the L3-4 level. Further interpretation is deferred to Dr. Saintclair Halsted.   Electronically Signed   By: David  Martinique M.D.   On: 05/31/2015 14:07   Dg C-arm 61-120 Min  05/31/2015   CLINICAL DATA:  Status post L3-4 fusion.  EXAM: DG C-ARM 61-120 MIN  COMPARISON:  None.  FINDINGS: Two intraoperative fluoroscopic  images of the lower lumbar spine demonstrate the patient be status post bilateral intrapedicular screw placement of L2, L3, L4 and L5 with interbody fusion. Good alignment of vertebral bodies is noted.  IMPRESSION: Status post posterior fusion of lower lumbar spine.   Electronically Signed   By: Marijo Conception, M.D.   On: 05/31/2015 14:13    Assessment/Plan: Mobilized today with physical therapy  LOS: 1 day     Gregory Diaz P 06/01/2015, 7:19 AM

## 2015-06-01 NOTE — Evaluation (Signed)
Physical Therapy Evaluation Patient Details Name: Gregory Diaz MRN: [id number] DOB: August 06, 1951 Today's Date: 06/01/2015   History of Present Illness  Patient is a 64 y/o male s/p PLIF - L3-L4 exploration of fusion and extension from L4-S1. PMH includes HTN and anxiety.   Clinical Impression  Patient presents with generalized weakness and mild balance deficits s/p above surgery. Reviewed back precautions. Tolerated ambulation with RW for safety. Plan for stair training next session as tolerated. Will continue to follow to maximize independence and mobility prior to return home.     Follow Up Recommendations Supervision/Assistance - 24 hour;No PT follow up    Equipment Recommendations  None recommended by PT    Recommendations for Other Services       Precautions / Restrictions Precautions Precautions: Back Precaution Booklet Issued: Yes (comment) Precaution Comments: Reviewed back precautions. Required Braces or Orthoses: Spinal Brace Spinal Brace: Lumbar corset Restrictions Weight Bearing Restrictions: No      Mobility  Bed Mobility Overal bed mobility: Needs Assistance Bed Mobility: Rolling;Sidelying to Sit;Sit to Sidelying Rolling: Min guard Sidelying to sit: Min guard     Sit to sidelying: Min guard General bed mobility comments: HOB flat, no use of rails to simulate home environment. Cues for log roll technique.  Transfers Overall transfer level: Needs assistance Equipment used: Rolling walker (2 wheeled) Transfers: Sit to/from Stand Sit to Stand: Min guard         General transfer comment: Min guard for safety. Cues for hand placement/technique.  Ambulation/Gait Ambulation/Gait assistance: Min guard Ambulation Distance (Feet): 150 Feet Assistive device: Rolling walker (2 wheeled) Gait Pattern/deviations: Step-through pattern;Decreased stride length;Wide base of support   Gait velocity interpretation: Below normal speed for age/gender General Gait  Details: Pt with slow, steady gait. Cues for RW management/safety.  Stairs            Wheelchair Mobility    Modified Rankin (Stroke Patients Only)       Balance Overall balance assessment: Needs assistance Sitting-balance support: Feet supported;No upper extremity supported Sitting balance-Leahy Scale: Good     Standing balance support: During functional activity Standing balance-Leahy Scale: Fair                               Pertinent Vitals/Pain Pain Assessment: No/denies pain (just reports discomfort at surgical site.)    Home Living Family/patient expects to be discharged to:: Private residence Living Arrangements: Spouse/significant other Available Help at Discharge: Family;Available PRN/intermittently Type of Home: House Home Access: Stairs to enter Entrance Stairs-Rails: None Entrance Stairs-Number of Steps: 2 Home Layout: One level Home Equipment: Walker - 2 wheels;Cane - single point;Crutches;Shower seat;Hand held shower head      Prior Function Level of Independence: Independent               Hand Dominance   Dominant Hand: Right    Extremity/Trunk Assessment   Upper Extremity Assessment: Defer to OT evaluation           Lower Extremity Assessment: Generalized weakness         Communication   Communication: No difficulties  Cognition Arousal/Alertness: Awake/alert Behavior During Therapy: WFL for tasks assessed/performed Overall Cognitive Status: Within Functional Limits for tasks assessed                      General Comments General comments (skin integrity, edema, etc.): Family present during session.    Exercises  Assessment/Plan    PT Assessment Patient needs continued PT services  PT Diagnosis Difficulty walking;Generalized weakness   PT Problem List Decreased strength;Decreased knowledge of precautions;Decreased mobility;Decreased balance;Decreased knowledge of use of DME;Decreased  activity tolerance  PT Treatment Interventions Balance training;Gait training;Stair training;Functional mobility training;Therapeutic activities;Therapeutic exercise;Patient/family education   PT Goals (Current goals can be found in the Care Plan section) Acute Rehab PT Goals Patient Stated Goal: to be able to walk again without pain PT Goal Formulation: With patient Time For Goal Achievement: 06/15/15 Potential to Achieve Goals: Good    Frequency Min 5X/week   Barriers to discharge        Co-evaluation               End of Session Equipment Utilized During Treatment: Gait belt;Back brace Activity Tolerance: Patient tolerated treatment well Patient left: in bed;with call bell/phone within reach;with bed alarm set;with family/visitor present Nurse Communication: Mobility status         Time: 0100-7121 PT Time Calculation (min) (ACUTE ONLY): 24 min   Charges:   PT Evaluation $Initial PT Evaluation Tier I: 1 Procedure PT Treatments $Gait Training: 8-22 mins   PT G Codes:        Shiquita Collignon A Luvern Mcisaac 06/01/2015, 2:46 PM Wray Kearns, [place], DPT [phone]

## 2015-06-02 MED ORDER — CYCLOBENZAPRINE HCL 10 MG PO TABS: 10.0000 mg | ORAL_TABLET | Freq: Three times a day (TID) | ORAL | Status: DC | PRN

## 2015-06-02 MED FILL — Sodium Chloride IV Soln 0.9%: INTRAVENOUS | Qty: 1000 | Status: AC

## 2015-06-02 MED FILL — Heparin Sodium (Porcine) Inj 1000 Unit/ML: INTRAMUSCULAR | Qty: 30 | Status: AC

## 2015-06-02 NOTE — Discharge Instructions (Signed)
No lifting no bending no twisting no driving a riding a car less discomfort and forth to see me. Keep the incision clean dry and intact. May remove the outer dressing in 3-4 days cover the Steri-Strips with saran wrap keep them dry during showers leave the Steri-Strips on until follow-up.

## 2015-06-02 NOTE — Progress Notes (Signed)
Discharge instruction reviewed with patient. All questions answered at this time. Awaiting for transport from family.  Magen Suriano, RN 

## 2015-06-02 NOTE — Progress Notes (Signed)
Physical Therapy Treatment Patient Details Name: Gregory Diaz MRN: [id number] DOB: [birth year] Today's Date: 06/02/2015    History of Present Illness Patient is a 64 y/o male s/p PLIF - L3-L4 exploration of fusion and extension from L4-S1. PMH includes HTN and anxiety.     PT Comments    Patient ambulating well, performed stair negotiation with minimal assist. Educated regarding mobility expectations and safety with mobility upon discharge. Reinforced precautions with patient with 2/3 recall.   Follow Up Recommendations  Supervision/Assistance - 24 hour;No PT follow up     Equipment Recommendations  None recommended by PT    Recommendations for Other Services       Precautions / Restrictions Precautions Precautions: Back Precaution Booklet Issued: Yes (comment) Precaution Comments: Reviewed back precautions. Required Braces or Orthoses: Spinal Brace Spinal Brace: Lumbar corset Restrictions Weight Bearing Restrictions: No    Mobility  Bed Mobility Overal bed mobility: Needs Assistance Bed Mobility: Rolling;Sidelying to Sit;Sit to Sidelying Rolling: Supervision Sidelying to sit: Supervision     Sit to sidelying: Supervision General bed mobility comments: HOB flat, no use of rails to simulate home environment. Cues for log roll technique.  Transfers Overall transfer level: Needs assistance Equipment used: Rolling walker (2 wheeled) Transfers: Sit to/from Stand Sit to Stand: Supervision         General transfer comment: VCs for hand placement  Ambulation/Gait Ambulation/Gait assistance: Supervision Ambulation Distance (Feet): 210 Feet Assistive device: Rolling walker (2 wheeled) Gait Pattern/deviations: Step-through pattern;Decreased stride length;Wide base of support Gait velocity: decreased   General Gait Details: VCs for upright posture and cues for compliance with precautions during mobility   Stairs Stairs: Yes Stairs assistance: Min assist Stair  Management: Step to pattern;Forwards;No rails Number of Stairs: 3 General stair comments: assist hand held for step to technique no rails, cues on sequencing and positioning of support   Wheelchair Mobility    Modified Rankin (Stroke Patients Only)       Balance   Sitting-balance support: Feet supported Sitting balance-Leahy Scale: Good     Standing balance support: During functional activity Standing balance-Leahy Scale: Fair                      Cognition Arousal/Alertness: Awake/alert Behavior During Therapy: WFL for tasks assessed/performed Overall Cognitive Status: Within Functional Limits for tasks assessed                      Exercises      General Comments        Pertinent Vitals/Pain      Home Living                      Prior Function            PT Goals (current goals can now be found in the care plan section) Acute Rehab PT Goals Patient Stated Goal: to be able to walk again without pain PT Goal Formulation: With patient Time For Goal Achievement: 06/15/15 Potential to Achieve Goals: Good Progress towards PT goals: Progressing toward goals    Frequency  Min 5X/week    PT Plan Current plan remains appropriate    Co-evaluation             End of Session Equipment Utilized During Treatment: Gait belt;Back brace Activity Tolerance: Patient tolerated treatment well Patient left: in bed;with call bell/phone within reach;with bed alarm set;with family/visitor present     Time: 7425-9563 PT  Time Calculation (min) (ACUTE ONLY): 16 min  Charges:  $Gait Training: 8-22 mins                    G CodesDuncan Dull 29-Jun-2015, 5:31 PM Alben Deeds, [place] DPT  [phone]

## 2015-06-02 NOTE — Discharge Summary (Signed)
  Physician Discharge Summary  Patient ID: Gregory Diaz MRN: [id number] DOB/AGE: 66-Sep-1952 66 y.o.  Admit date: 05/31/2015 Discharge date: 06/02/2015  Admission Diagnoses: HNP lumbar spinal stenosis L3-4 same  Discharge Diagnoses: Same Active Problems:   HNP (herniated nucleus pulposus), lumbar   Discharged Condition: good  Hospital Course: Patient admitted hospital underwent a decompressed laminectomy and fusion postoperatively did very well recovered in the floor on the floor he was ambulating and voiding spontaneously tolerating regular diet was stable for discharge home posterior day 2.  Consults: Significant Diagnostic Studies: Treatments: L3-4 interbody fusion Discharge Exam: Blood pressure 138/47, pulse 56, temperature 98.2 F (36.8 C), temperature source Oral, resp. rate 20, height 6\' 2"  (1.88 m), weight 139.663 kg (307 lb 14.4 oz), SpO2 100 %. Strength 5 out of 5 wound clean dry and intact  Disposition: Home     Medication List    TAKE these medications        acetaminophen 500 MG tablet  Commonly known as:  TYLENOL  Take 500-1,000 mg by mouth every 6 (six) hours as needed for mild pain, moderate pain or headache.     cyclobenzaprine 10 MG tablet  Commonly known as:  FLEXERIL  Take 1 tablet (10 mg total) by mouth 3 (three) times daily as needed for muscle spasms.     HYDROcodone-acetaminophen 5-325 MG per tablet  Commonly known as:  NORCO/VICODIN  Take 1-2 tablets by mouth every 6 (six) hours as needed for moderate pain.     lisinopril-hydrochlorothiazide 20-12.5 MG per tablet  Commonly known as:  PRINZIDE,ZESTORETIC  Take 1 tablet by mouth daily.     MULTIVITAMIN & MINERAL PO  Take 1 tablet by mouth daily.           Follow-up Information    Follow up with [facility], MD.   Specialty:  Neurosurgery   Contact information:   [address] [place] [address] [phone]       Signed: Elaina Hoops 06/02/2015, 4:09  PM

## 2015-06-02 NOTE — Care Management Important Message (Signed)
Important Message  Patient Details  Name: Gregory Diaz MRN: [id number] Date of Birth: 1950-12-12   Medicare Important Message Given:  Yes-second notification given    Delorse Lek 06/02/2015, 1:05 PM

## 2015-06-02 NOTE — Progress Notes (Signed)
Patient ID: Gregory Diaz, male   DOB: 1951/10/06, 64 y.o.   MRN: [id number] Doing well no leg pain minimal back pain  Strength out of 5 wound clean dry and intact  Discharge home

## 2015-08-19 DIAGNOSIS — M5137 Other intervertebral disc degeneration, lumbosacral region: Secondary | ICD-10-CM | POA: Diagnosis not present

## 2015-08-19 DIAGNOSIS — Z6841 Body Mass Index (BMI) 40.0 and over, adult: Secondary | ICD-10-CM | POA: Diagnosis not present

## 2015-08-19 DIAGNOSIS — I10 Essential (primary) hypertension: Secondary | ICD-10-CM | POA: Diagnosis not present

## 2015-08-19 DIAGNOSIS — M4806 Spinal stenosis, lumbar region: Secondary | ICD-10-CM | POA: Diagnosis not present

## 2015-08-22 ENCOUNTER — Ambulatory Visit (INDEPENDENT_AMBULATORY_CARE_PROVIDER_SITE_OTHER): Payer: Medicare HMO | Admitting: Family Medicine

## 2015-08-22 ENCOUNTER — Inpatient Hospital Stay (HOSPITAL_COMMUNITY)
Admission: EM | Admit: 2015-08-22 | Discharge: 2015-08-27 | DRG: 698 | Disposition: A | Discharge status: Home / Self Care | Payer: Medicare HMO | Attending: Internal Medicine | Admitting: Internal Medicine

## 2015-08-22 ENCOUNTER — Encounter (HOSPITAL_COMMUNITY): Payer: Self-pay | Admitting: Emergency Medicine

## 2015-08-22 ENCOUNTER — Ambulatory Visit (INDEPENDENT_AMBULATORY_CARE_PROVIDER_SITE_OTHER): Payer: Medicare HMO

## 2015-08-22 VITALS — BP 138/88 | HR 58 | Temp 97.8°F | Resp 20 | Ht 71.25 in | Wt 336.0 lb

## 2015-08-22 DIAGNOSIS — M5136 Other intervertebral disc degeneration, lumbar region: Secondary | ICD-10-CM

## 2015-08-22 DIAGNOSIS — R609 Edema, unspecified: Secondary | ICD-10-CM | POA: Diagnosis not present

## 2015-08-22 DIAGNOSIS — R0600 Dyspnea, unspecified: Secondary | ICD-10-CM | POA: Diagnosis not present

## 2015-08-22 DIAGNOSIS — M4806 Spinal stenosis, lumbar region: Secondary | ICD-10-CM | POA: Diagnosis not present

## 2015-08-22 DIAGNOSIS — Z79891 Long term (current) use of opiate analgesic: Secondary | ICD-10-CM | POA: Diagnosis not present

## 2015-08-22 DIAGNOSIS — E119 Type 2 diabetes mellitus without complications: Secondary | ICD-10-CM | POA: Diagnosis present

## 2015-08-22 DIAGNOSIS — D509 Iron deficiency anemia, unspecified: Secondary | ICD-10-CM | POA: Diagnosis present

## 2015-08-22 DIAGNOSIS — I130 Hypertensive heart and chronic kidney disease with heart failure and stage 1 through stage 4 chronic kidney disease, or unspecified chronic kidney disease: Secondary | ICD-10-CM | POA: Diagnosis present

## 2015-08-22 DIAGNOSIS — N179 Acute kidney failure, unspecified: Secondary | ICD-10-CM | POA: Diagnosis not present

## 2015-08-22 DIAGNOSIS — N400 Enlarged prostate without lower urinary tract symptoms: Secondary | ICD-10-CM

## 2015-08-22 DIAGNOSIS — M545 Low back pain: Secondary | ICD-10-CM | POA: Diagnosis present

## 2015-08-22 DIAGNOSIS — Z880 Allergy status to penicillin: Secondary | ICD-10-CM

## 2015-08-22 DIAGNOSIS — N049 Nephrotic syndrome with unspecified morphologic changes: Secondary | ICD-10-CM | POA: Insufficient documentation

## 2015-08-22 DIAGNOSIS — R601 Generalized edema: Secondary | ICD-10-CM | POA: Diagnosis not present

## 2015-08-22 DIAGNOSIS — R809 Proteinuria, unspecified: Secondary | ICD-10-CM

## 2015-08-22 DIAGNOSIS — E8809 Other disorders of plasma-protein metabolism, not elsewhere classified: Secondary | ICD-10-CM | POA: Diagnosis not present

## 2015-08-22 DIAGNOSIS — F419 Anxiety disorder, unspecified: Secondary | ICD-10-CM | POA: Diagnosis present

## 2015-08-22 DIAGNOSIS — Z6841 Body Mass Index (BMI) 40.0 and over, adult: Secondary | ICD-10-CM

## 2015-08-22 DIAGNOSIS — R9431 Abnormal electrocardiogram [ECG] [EKG]: Secondary | ICD-10-CM | POA: Diagnosis not present

## 2015-08-22 DIAGNOSIS — E1129 Type 2 diabetes mellitus with other diabetic kidney complication: Secondary | ICD-10-CM | POA: Diagnosis not present

## 2015-08-22 DIAGNOSIS — R0609 Other forms of dyspnea: Secondary | ICD-10-CM

## 2015-08-22 DIAGNOSIS — E1122 Type 2 diabetes mellitus with diabetic chronic kidney disease: Secondary | ICD-10-CM | POA: Diagnosis not present

## 2015-08-22 DIAGNOSIS — N182 Chronic kidney disease, stage 2 (mild): Secondary | ICD-10-CM | POA: Diagnosis present

## 2015-08-22 DIAGNOSIS — K921 Melena: Secondary | ICD-10-CM

## 2015-08-22 DIAGNOSIS — R600 Localized edema: Secondary | ICD-10-CM | POA: Diagnosis not present

## 2015-08-22 DIAGNOSIS — I509 Heart failure, unspecified: Secondary | ICD-10-CM | POA: Diagnosis not present

## 2015-08-22 DIAGNOSIS — I10 Essential (primary) hypertension: Secondary | ICD-10-CM | POA: Diagnosis present

## 2015-08-22 DIAGNOSIS — Z791 Long term (current) use of non-steroidal anti-inflammatories (NSAID): Secondary | ICD-10-CM | POA: Diagnosis not present

## 2015-08-22 DIAGNOSIS — N2889 Other specified disorders of kidney and ureter: Secondary | ICD-10-CM | POA: Diagnosis not present

## 2015-08-22 DIAGNOSIS — Z794 Long term (current) use of insulin: Secondary | ICD-10-CM | POA: Diagnosis not present

## 2015-08-22 DIAGNOSIS — F17210 Nicotine dependence, cigarettes, uncomplicated: Secondary | ICD-10-CM | POA: Diagnosis present

## 2015-08-22 DIAGNOSIS — I120 Hypertensive chronic kidney disease with stage 5 chronic kidney disease or end stage renal disease: Secondary | ICD-10-CM | POA: Diagnosis not present

## 2015-08-22 DIAGNOSIS — I5031 Acute diastolic (congestive) heart failure: Secondary | ICD-10-CM | POA: Diagnosis not present

## 2015-08-22 DIAGNOSIS — M48061 Spinal stenosis, lumbar region without neurogenic claudication: Secondary | ICD-10-CM | POA: Diagnosis present

## 2015-08-22 DIAGNOSIS — M549 Dorsalgia, unspecified: Secondary | ICD-10-CM | POA: Diagnosis not present

## 2015-08-22 DIAGNOSIS — Z981 Arthrodesis status: Secondary | ICD-10-CM

## 2015-08-22 DIAGNOSIS — R69 Illness, unspecified: Secondary | ICD-10-CM | POA: Diagnosis not present

## 2015-08-22 DIAGNOSIS — D649 Anemia, unspecified: Secondary | ICD-10-CM | POA: Diagnosis not present

## 2015-08-22 LAB — COMPREHENSIVE METABOLIC PANEL
ALT: 22 U/L (ref 9–46)
ALT: 23 U/L (ref 17–63)
AST: 22 U/L (ref 10–35)
AST: 24 U/L (ref 15–41)
Albumin: 2.4 g/dL — ABNORMAL LOW (ref 3.6–5.1)
Albumin: 2.1 g/dL — ABNORMAL LOW (ref 3.5–5.0)
Alkaline Phosphatase: 81 U/L (ref 40–115)
Alkaline Phosphatase: 80 U/L (ref 38–126)
Anion gap: 6 (ref 5–15)
BUN: 27 mg/dL — ABNORMAL HIGH (ref 7–25)
BUN: 25 mg/dL — ABNORMAL HIGH (ref 6–20)
CO2: 29 mmol/L (ref 20–31)
CO2: 27 mmol/L (ref 22–32)
Calcium: 7.9 mg/dL — ABNORMAL LOW (ref 8.6–10.3)
Calcium: 7.8 mg/dL — ABNORMAL LOW (ref 8.9–10.3)
Chloride: 108 mmol/L (ref 98–110)
Chloride: 105 mmol/L (ref 101–111)
Creat: 1.28 mg/dL — ABNORMAL HIGH (ref 0.70–1.25)
Creatinine, Ser: 1.32 mg/dL — ABNORMAL HIGH (ref 0.61–1.24)
GFR calc Af Amer: >60 mL/min (ref >60)
GFR calc non Af Amer: 55 mL/min — ABNORMAL LOW (ref >60)
Glucose, Bld: 96 mg/dL (ref 65–99)
Glucose, Bld: 104 mg/dL — ABNORMAL HIGH (ref 65–99)
Potassium: 4.1 mmol/L (ref 3.5–5.3)
Potassium: 3.8 mmol/L (ref 3.5–5.1)
Sodium: 144 mmol/L (ref 135–146)
Sodium: 138 mmol/L (ref 135–145)
Total Bilirubin: 0.5 mg/dL (ref 0.2–1.2)
Total Bilirubin: 0.6 mg/dL (ref 0.3–1.2)
Total Protein: 5.4 g/dL — ABNORMAL LOW (ref 6.1–8.1)
Total Protein: 5.4 g/dL — ABNORMAL LOW (ref 6.5–8.1)

## 2015-08-22 LAB — PROTEIN / CREATININE RATIO, URINE
Creatinine, Urine: 42.36 mg/dL
Protein Creatinine Ratio: 5.97 mg/mg{Cre} — ABNORMAL HIGH (ref 0.00–0.15)
Total Protein, Urine: 253 mg/dL

## 2015-08-22 LAB — CBC
HCT: 35.6 % — ABNORMAL LOW (ref 39.0–52.0)
HCT: 34.1 % — ABNORMAL LOW (ref 39.0–52.0)
Hemoglobin: 11.5 g/dL — ABNORMAL LOW (ref 13.0–17.0)
Hemoglobin: 11 g/dL — ABNORMAL LOW (ref 13.0–17.0)
MCH: 27.8 pg (ref 26.0–34.0)
MCH: 28.1 pg (ref 26.0–34.0)
MCHC: 32.3 g/dL (ref 30.0–36.0)
MCHC: 32.3 g/dL (ref 30.0–36.0)
MCV: 86.2 fL (ref 78.0–100.0)
MCV: 87 fL (ref 78.0–100.0)
Platelets: 291 10*3/uL (ref 150–400)
Platelets: 294 10*3/uL (ref 150–400)
RBC: 4.13 MIL/uL — ABNORMAL LOW (ref 4.22–5.81)
RBC: 3.92 MIL/uL — ABNORMAL LOW (ref 4.22–5.81)
RDW: 12.8 % (ref 11.5–15.5)
RDW: 12.9 % (ref 11.5–15.5)
WBC: 7.6 10*3/uL (ref 4.0–10.5)
WBC: 7.7 10*3/uL (ref 4.0–10.5)

## 2015-08-22 LAB — POCT URINALYSIS DIP (MANUAL ENTRY)
Glucose, UA: NEGATIVE
Ketones, POC UA: NEGATIVE
Leukocytes, UA: NEGATIVE
Nitrite, UA: NEGATIVE
Protein Ur, POC: >=300 — AB
Spec Grav, UA: 1.025
Urobilinogen, UA: 0.2
pH, UA: 5.5

## 2015-08-22 LAB — URINALYSIS, ROUTINE W REFLEX MICROSCOPIC
Bilirubin Urine: NEGATIVE
Glucose, UA: NEGATIVE mg/dL
Ketones, ur: NEGATIVE mg/dL
Leukocytes, UA: NEGATIVE
Nitrite: NEGATIVE
Protein, ur: 100 mg/dL — AB
Specific Gravity, Urine: 1.02 (ref 1.005–1.030)
Urobilinogen, UA: 0.2 mg/dL (ref 0.0–1.0)
pH: 6 (ref 5.0–8.0)

## 2015-08-22 LAB — LIPASE, BLOOD: Lipase: 39 U/L (ref 22–51)

## 2015-08-22 LAB — LACTATE DEHYDROGENASE: LDH: 148 U/L (ref 98–192)

## 2015-08-22 LAB — IFOBT (OCCULT BLOOD): IFOBT: POSITIVE

## 2015-08-22 LAB — POCT CBC
Granulocyte percent: 67.3 %G (ref 37–80)
HCT, POC: 35.9 % — AB (ref 43.5–53.7)
Hemoglobin: 11.2 g/dL — AB (ref 14.1–18.1)
Lymph, poc: 1.7 (ref 0.6–3.4)
MCH, POC: 25.8 pg — AB (ref 27–31.2)
MCHC: 31.1 g/dL — AB (ref 31.8–35.4)
MCV: 82.9 fL (ref 80–97)
MID (cbc): 0.8 (ref 0–0.9)
MPV: 7.6 fL (ref 0–99.8)
POC Granulocyte: 5.2 (ref 2–6.9)
POC LYMPH PERCENT: 22.4 %L (ref 10–50)
POC MID %: 10.3 %M (ref 0–12)
Platelet Count, POC: 329 10*3/uL (ref 142–424)
RBC: 4.33 M/uL — AB (ref 4.69–6.13)
RDW, POC: 13.5 %
WBC: 7.8 10*3/uL (ref 4.6–10.2)

## 2015-08-22 LAB — GLUCOSE, POCT (MANUAL RESULT ENTRY): POC Glucose: 105 mg/dl — AB (ref 70–99)

## 2015-08-22 LAB — BRAIN NATRIURETIC PEPTIDE: B Natriuretic Peptide: 410.3 pg/mL — ABNORMAL HIGH (ref 0.0–100.0)

## 2015-08-22 LAB — CREATININE, SERUM
Creatinine, Ser: 1.37 mg/dL — ABNORMAL HIGH (ref 0.61–1.24)
GFR calc Af Amer: >60 mL/min (ref >60)
GFR calc non Af Amer: 52 mL/min — ABNORMAL LOW (ref >60)

## 2015-08-22 LAB — URINE MICROSCOPIC-ADD ON

## 2015-08-22 LAB — I-STAT TROPONIN, ED: Troponin i, poc: 0.02 ng/mL (ref 0.00–0.08)

## 2015-08-22 LAB — SODIUM, URINE, RANDOM: Sodium, Ur: 120 mmol/L

## 2015-08-22 LAB — CREATININE, URINE, RANDOM: Creatinine, Urine: 42.73 mg/dL

## 2015-08-22 LAB — TSH: TSH: 1.917 u[IU]/mL (ref 0.350–4.500)

## 2015-08-22 MED ORDER — OXYCODONE-ACETAMINOPHEN 10-325 MG PO TABS: 1.0000 | ORAL_TABLET | ORAL | Status: DC | PRN

## 2015-08-22 MED ORDER — SENNOSIDES-DOCUSATE SODIUM 8.6-50 MG PO TABS
1.0000 | ORAL_TABLET | Freq: Every evening | ORAL | Status: DC | PRN
Start: 2015-08-22 — End: 2015-08-27
  Administered 2015-08-26: 1 via ORAL
  Filled 2015-08-22: qty 1

## 2015-08-22 MED ORDER — SODIUM CHLORIDE 0.9 % IJ SOLN
3.0000 mL | Freq: Two times a day (BID) | INTRAMUSCULAR | Status: DC
  Administered 2015-08-22 – 2015-08-27 (×9): 3 mL via INTRAVENOUS

## 2015-08-22 MED ORDER — FUROSEMIDE 10 MG/ML IJ SOLN
40.0000 mg | INTRAMUSCULAR | Status: AC
  Administered 2015-08-22: 40 mg via INTRAVENOUS
  Filled 2015-08-22: qty 4

## 2015-08-22 MED ORDER — FUROSEMIDE 10 MG/ML IJ SOLN
40.0000 mg | Freq: Two times a day (BID) | INTRAMUSCULAR | Status: DC
  Administered 2015-08-22 – 2015-08-23 (×3): 40 mg via INTRAVENOUS
  Filled 2015-08-22 (×3): qty 4

## 2015-08-22 MED ORDER — ENOXAPARIN SODIUM 40 MG/0.4ML ~~LOC~~ SOLN
40.0000 mg | SUBCUTANEOUS | Status: DC
  Administered 2015-08-22: 40 mg via SUBCUTANEOUS
  Filled 2015-08-22: qty 0.4

## 2015-08-22 MED ORDER — OXYCODONE-ACETAMINOPHEN 5-325 MG PO TABS
1.0000 | ORAL_TABLET | ORAL | Status: DC | PRN
  Administered 2015-08-22 – 2015-08-25 (×4): 1 via ORAL
  Filled 2015-08-22 (×5): qty 1

## 2015-08-22 MED ORDER — HYDROCHLOROTHIAZIDE 12.5 MG PO CAPS
12.5000 mg | ORAL_CAPSULE | Freq: Every day | ORAL | Status: DC
  Administered 2015-08-22 – 2015-08-23 (×2): 12.5 mg via ORAL
  Filled 2015-08-22 (×2): qty 1

## 2015-08-22 MED ORDER — AMLODIPINE BESYLATE 5 MG PO TABS
5.0000 mg | ORAL_TABLET | Freq: Every day | ORAL | Status: DC
  Administered 2015-08-22 – 2015-08-27 (×6): 5 mg via ORAL
  Filled 2015-08-22 (×8): qty 1

## 2015-08-22 MED ORDER — BISACODYL 10 MG RE SUPP
10.0000 mg | Freq: Every day | RECTAL | Status: DC | PRN
  Administered 2015-08-23: 10 mg via RECTAL
  Filled 2015-08-22: qty 1

## 2015-08-22 MED ORDER — OXYCODONE HCL 5 MG PO TABS
5.0000 mg | ORAL_TABLET | ORAL | Status: DC | PRN
  Administered 2015-08-22 – 2015-08-25 (×4): 5 mg via ORAL
  Filled 2015-08-22 (×5): qty 1

## 2015-08-22 NOTE — Progress Notes (Addendum)
Subjective:  This chart was scribed for [name], MD by [facility], medical scribe at [facility].The patient was seen in exam room 10 and the patient's care was started at 11:37 AM.   Patient ID: Gregory Diaz, male    DOB: Apr 02, 1951, 64 y.o.   MRN: [id number]  08/22/2015  Foot Swelling; Leg Swelling; Generalized Body Aches; and Cough  HPI HPI Comments: Gregory Diaz is a 64 y.o. male who presents to [facility] complaining of swelling in his lower and upper extremities with associated pain. This began 4-5 days ago acutely with gradual swelling for the past two months. Unable to walk, he becomes very short of breath for the past week.  Also complains of generalized body aches. Cough for the past 3-4 days with ambulation. Similar swelling in 02-26-14 which passed on its own. No fever, chills, diaphoresis, nausea, vomiting, dysuria. Wife says increased salt intake with his diet for the past two months. Weight two months ago was 292 prior to back surgery. Intermittent facial swelling for the past 4-5 years with stress; denies tongue swelling or lip swelling; no throat swelling. He attributes this to stress. No unusual foods, no rash or itching.   Typically lays around at home. He says he is unable to move around due to his soreness. He has been constipated. Taking pain medication for his back and one vegetable laxative daily for the constipation. Pt has noticed blood in his stool. Testicle swelling began yesterday.  Wife reports known BPH: stopped BPH medication in 10/2014. Weak urinary stream.  Has been sleeping excessively for the past month.  Medical history: L3-L4 fusion extension in July, back pain did not subside due to this. Appointment last Tuesday with NS; s/p MRI last week due to ongoing back pain; NS stated that everything looked good.   Hx of HTN, no stents, heart failure. PCP is Dr. Hilma Favors due to see him Oct. 24 th. Heart murmur in the  past. Told he had a tumor in his abdomen prior to his back surgery. Colonoscopy over a year ago.  Social history: Smokes 1 p.p.d for 50 years.Does not drink. Retired July 2014, was a Pharmacist, community. Married  Family History: Mother living, 44 years old. No health problems. Father passed at 64. Ulcers, no other heath problems. Siblings some are diabetic.  Review of Systems  Constitutional: Negative for fever, chills, diaphoresis, activity change, appetite change and fatigue.  HENT: Positive for facial swelling. Negative for mouth sores and sore throat.   Respiratory: Positive for cough and shortness of breath. Negative for chest tightness, wheezing and stridor.   Cardiovascular: Positive for leg swelling. Negative for chest pain and palpitations.  Gastrointestinal: Positive for constipation and blood in stool. Negative for nausea, vomiting, abdominal pain and diarrhea.  Endocrine: Negative for cold intolerance, heat intolerance, polydipsia, polyphagia and polyuria.  Genitourinary: Positive for decreased urine volume and scrotal swelling. Negative for urgency, frequency, penile pain and testicular pain.  Musculoskeletal: Positive for myalgias, back pain and arthralgias.  Skin: Negative for color change, rash and wound.  Neurological: Negative for dizziness, tremors, seizures, syncope, facial asymmetry, speech difficulty, weakness, light-headedness, numbness and headaches.  Psychiatric/Behavioral: Negative for sleep disturbance and dysphoric mood. The patient is not nervous/anxious.     Past Medical History  Diagnosis Date  . Hypertension   . Heart murmur     was told at age 46, but has not had any problems  . Enlarged prostate   .  Anxiety   . Early cataracts, bilateral   . Arthritis     DDD, low back   Past Surgical History  Procedure Laterality Date  . Hemorroidectomy    . Fracture surgery Right     thumb  . Colonoscopy    . Hernia repair      umbilical hernia repair  . Tonsillectomy     . Back surgery  2004, 2015  . Eye surgery      L eye- as a child    Allergies  Allergen Reactions  . Penicillins Swelling        Current Outpatient Prescriptions  Medication Sig Dispense Refill  . acetaminophen (TYLENOL) 500 MG tablet Take 500-1,000 mg by mouth every 6 (six) hours as needed for mild pain, moderate pain or headache.     . cyclobenzaprine (FLEXERIL) 10 MG tablet Take 1 tablet (10 mg total) by mouth 3 (three) times daily as needed for muscle spasms. 60 tablet 0  . lisinopril-hydrochlorothiazide (PRINZIDE,ZESTORETIC) 20-12.5 MG per tablet Take 1 tablet by mouth daily.    . naproxen sodium (ANAPROX) 220 MG tablet Take 220 mg by mouth 2 (two) times daily with a meal.    . oxyCODONE-acetaminophen (PERCOCET) 10-325 MG tablet Take 1 tablet by mouth every 4 (four) hours as needed for pain.    Marland Kitchen HYDROcodone-acetaminophen (NORCO/VICODIN) 5-325 MG per tablet Take 1-2 tablets by mouth every 6 (six) hours as needed for moderate pain.    . Multiple Vitamins-Minerals (MULTIVITAMIN & MINERAL PO) Take 1 tablet by mouth daily.     No current facility-administered medications for this visit.      Objective:    BP 138/88 mmHg  Pulse 58  Temp(Src) 97.8 F (36.6 C) (Oral)  Resp 20  Ht 5' 11.25" (1.81 m)  Wt 336 lb (152.409 kg)  BMI 46.52 kg/m2  SpO2 98% Physical Exam  Constitutional: He is oriented to person, place, and time. He appears well-developed and well-nourished. No distress.  HENT:  Head: Normocephalic and atraumatic.  Right Ear: External ear normal.  Left Ear: External ear normal.  Nose: Nose normal.  Mouth/Throat: Oropharynx is clear and moist.  Facial swelling; +periorbital edema.  Eyes: Conjunctivae and EOM are normal. Pupils are equal, round, and reactive to light.  Neck: Normal range of motion. Neck supple. Carotid bruit is not present. No thyromegaly present.  Cardiovascular: Normal rate, regular rhythm, normal heart sounds and intact distal pulses.  Exam  reveals no gallop and no friction rub.   No murmur heard. 3+ edema to proximal thighs; no sacral edema; +edema B hands.  Pulmonary/Chest: Effort normal and breath sounds normal. He has no wheezes. He has no rales.  Abdominal: Soft. Bowel sounds are normal. He exhibits no distension and no mass. There is no tenderness. There is no rebound and no guarding.  Genitourinary:  Moderate to severe scrotal swelling.  Residual blood in his underwear. Rectum was normal. Blood on glove after rectal exam. External hemorrhoids were not inflamed.  Musculoskeletal: He exhibits edema.  Swelling up to proximal thigh bilaterally.   Lymphadenopathy:    He has no cervical adenopathy.  Neurological: He is alert and oriented to person, place, and time. No cranial nerve deficit.  Skin: Skin is warm and dry. No rash noted. He is not diaphoretic.  Psychiatric: He has a normal mood and affect. His behavior is normal.  Nursing note and vitals reviewed.   Wt Readings from Last 3 Encounters:  08/22/15 336 lb (152.409 kg)  05/31/15 307 lb 14.4 oz (139.663 kg)  05/25/15 298 lb 14.4 oz (135.58 kg)   Results for orders placed or performed in visit on 08/22/15  POCT urinalysis dipstick  Result Value Ref Range   Color, UA yellow yellow   Clarity, UA clear clear   Glucose, UA negative negative   Bilirubin, UA small (A) negative   Ketones, POC UA negative negative   Spec Grav, UA 1.025    Blood, UA large (A) negative   pH, UA 5.5    Protein Ur, POC >=300 (A) negative   Urobilinogen, UA 0.2    Nitrite, UA Negative Negative   Leukocytes, UA Negative Negative  POCT CBC  Result Value Ref Range   WBC 7.8 4.6 - 10.2 K/uL   Lymph, poc 1.7 0.6 - 3.4   POC LYMPH PERCENT 22.4 10 - 50 %L   MID (cbc) 0.8 0 - 0.9   POC MID % 10.3 0 - 12 %M   POC Granulocyte 5.2 2 - 6.9   Granulocyte percent 67.3 37 - 80 %G   RBC 4.33 (A) 4.69 - 6.13 M/uL   Hemoglobin 11.2 (A) 14.1 - 18.1 g/dL   HCT, POC 35.9 (A) 43.5 - 53.7 %   MCV  82.9 80 - 97 fL   MCH, POC 25.8 (A) 27 - 31.2 pg   MCHC 31.1 (A) 31.8 - 35.4 g/dL   RDW, POC 13.5 %   Platelet Count, POC 329 142 - 424 K/uL   MPV 7.6 0 - 99.8 fL  POCT glucose (manual entry)  Result Value Ref Range   POC Glucose 105 (A) 70 - 99 mg/dl  IFOBT POC (occult bld, rslt in office)  Result Value Ref Range   IFOBT Positive    [facility] reading (PRIMARY) by  Dr. Tamala Julian.  CXR: mild to moderate pulmonary edema  EKG: sinus bradycardia at 49; diffuse ST flattening throughout.    Assessment & Plan:   1. Anasarca   2. Proteinuria   3. Swelling   4. Dyspnea on exertion   5. Bloody stools   6. BPH (benign prostatic hyperplasia)     1.  Anasarca:  New. With proteinuria, DOE; to ED for further evaluation; warrants cardiac enzymes and STAT BUN/creatinine.  Weight up 40 pounds in past three months. 2.  Proteinuria:  New . With anasaraca and pulmonary edema. To ED for stat BUN/Creatinine.  Normal creatinine of 1.07 in 05/2015.  3.  DOE: New. Associated with severe swelling/anasarca.  No respiratory distress; no orthopnea. 4.  Bloody stools: New. Associated with constipation.  Stable H/H. 5  BPH: uncontrolled; non-compliant with medication for the past ten months. 6. HTN: stable.   7. DDD lumbar spine: s/p recent surgery in 05/2015.  Still requiring narcotics. 8. Abnormal EKG: chronic with worsening from 05/2015.    Meds ordered this encounter  Medications  . naproxen sodium (ANAPROX) 220 MG tablet    Sig: Take 220 mg by mouth 2 (two) times daily with a meal.  . oxyCODONE-acetaminophen (PERCOCET) 10-325 MG tablet    Sig: Take 1 tablet by mouth every 4 (four) hours as needed for pain.   No Follow-up on file.  By signing my name below, I, Nadim Abuhashem, attest that this documentation has been prepared under the direction and in the presence of [name], MD.  Electronically Signed: Lora Havens, medical scribe. 08/22/2015, 12:56 PM.   I personally performed the services  described in this documentation, which was scribed in my presence.  The recorded information has been reviewed and considered.  Shalin Vonbargen Elayne Guerin, M.D. [facility] [address] [place], [place]  [address] [phone] phone [phone] fax

## 2015-08-22 NOTE — ED Notes (Signed)
Pt c/o abdominal pain and swelling to B/L arms/hands ongoing since having back surgery 2 months ago. Pt reports symptoms increased in past 10 days.

## 2015-08-22 NOTE — ED Provider Notes (Signed)
CSN: [id number]     Arrival date & time 08/22/15  1318 History   First MD Initiated Contact with Patient 08/22/15 1327     Chief Complaint  Patient presents with  . Shortness of Breath  . Edema    HPI   Gregory Diaz is a 64 y.o. male with a PMH of HTN, BPH, lumbar fusion (July 2016) who presents to the ED with generalized edema. He reports he has experienced swelling in his upper and lower extremities for the past 5 days. He states usually this resolves throughout the day, but for the past 2 days he has had worsening and persistent swelling in his face, upper extremities, lower extremities, and scrotum. He denies fever, chills, lightheadedness, dizziness. He states he had a headache on Monday, now resolved. He denies chest pain. He reports shortness of breath with exertion. He denies abdominal pain, nausea, vomiting, diarrhea, constipation, dysuria, urgency, frequency.   Past Medical History  Diagnosis Date  . Hypertension   . Heart murmur     was told at age 37, but has not had any problems  . Enlarged prostate   . Anxiety   . Early cataracts, bilateral   . Arthritis     DDD, low back   Past Surgical History  Procedure Laterality Date  . Hemorroidectomy    . Fracture surgery Right     thumb  . Colonoscopy    . Hernia repair      umbilical hernia repair  . Tonsillectomy    . Back surgery  2004, 2015  . Eye surgery      L eye- as a child    Family History  Problem Relation Age of Onset  . Gallstones Father   . Ulcers Father   . Alcohol abuse Father   . Diabetes Daughter   . Diabetes Brother    Social History  Substance Use Topics  . Smoking status: Current Every Day Smoker -- 1.00 packs/day for 50 years    Types: Cigarettes  . Smokeless tobacco: Never Used  . Alcohol Use: No      Review of Systems  Constitutional: Positive for fatigue. Negative for fever and chills.  HENT: Positive for facial swelling.   Respiratory: Negative for shortness of breath.    Cardiovascular: Positive for leg swelling. Negative for chest pain.  Gastrointestinal: Positive for abdominal distention. Negative for nausea, abdominal pain, diarrhea and constipation.  Genitourinary: Positive for scrotal swelling. Negative for dysuria, urgency and frequency.  Musculoskeletal: Positive for back pain.  Neurological: Positive for headaches. Negative for dizziness, syncope, weakness and light-headedness.  All other systems reviewed and are negative.     Allergies  Penicillins  Home Medications   Prior to Admission medications   Medication Sig Start Date End Date Taking? Authorizing Provider  lisinopril-hydrochlorothiazide (PRINZIDE,ZESTORETIC) 20-12.5 MG per tablet Take 1 tablet by mouth daily.   Yes Historical Provider, MD  Naproxen Sod-Diphenhydramine (ALEVE PM) 220-25 MG TABS Take 3 tablets by mouth at bedtime as needed (pain).   Yes Historical Provider, MD  oxyCODONE-acetaminophen (PERCOCET) 10-325 MG tablet Take 1 tablet by mouth every 4 (four) hours as needed for pain.   Yes Historical Provider, MD    BP 154/102 mmHg  Pulse 49  Temp(Src) 98.3 F (36.8 C) (Oral)  Resp 11  Ht 6\' 2"  (1.88 m)  Wt 335 lb 1.6 oz (152 kg)  BMI 43.01 kg/m2  SpO2 96% Physical Exam  Constitutional: He is oriented to person, place, and time.  No distress.  Obese male in no acute distress.  HENT:  Head: Normocephalic and atraumatic.  Right Ear: External ear normal.  Left Ear: External ear normal.  Nose: Nose normal.  Mouth/Throat: Uvula is midline, oropharynx is clear and moist and mucous membranes are normal.  Eyes: Conjunctivae, EOM and lids are normal. Pupils are equal, round, and reactive to light. Right eye exhibits no discharge. Left eye exhibits no discharge. No scleral icterus.  Mild edema to eyelids bilaterally.  Neck: Normal range of motion. Neck supple.  Cardiovascular: Normal rate, regular rhythm, normal heart sounds, intact distal pulses and normal pulses.    Pulmonary/Chest: Effort normal. No respiratory distress. He has no wheezes. He has rales.  Rales to lower lung fields bilaterally.  Abdominal: Soft. Normal appearance and bowel sounds are normal. He exhibits distension. He exhibits no mass. There is no tenderness. There is no rigidity, no rebound and no guarding.  Abdomen mildly distended.  Musculoskeletal: Normal range of motion. He exhibits edema. He exhibits no tenderness.  2+ pitting edema to hands and lower extremities bilaterally.  Neurological: He is alert and oriented to person, place, and time.  Skin: Skin is warm, dry and intact. No rash noted. He is not diaphoretic. No erythema. No pallor.  Psychiatric: He has a normal mood and affect. His speech is normal and behavior is normal. Judgment and thought content normal.  Nursing note and vitals reviewed.     ED Course  Procedures (including critical care time)  Labs Review Labs Reviewed  COMPREHENSIVE METABOLIC PANEL - Abnormal; Notable for the following:    Glucose, Bld 104 (*)    BUN 25 (*)    Creatinine, Ser 1.32 (*)    Calcium 7.8 (*)    Total Protein 5.4 (*)    Albumin 2.1 (*)    GFR calc non Af Amer 55 (*)    All other components within normal limits  CBC - Abnormal; Notable for the following:    RBC 4.13 (*)    Hemoglobin 11.5 (*)    HCT 35.6 (*)    All other components within normal limits  BRAIN NATRIURETIC PEPTIDE - Abnormal; Notable for the following:    B Natriuretic Peptide 410.3 (*)    All other components within normal limits  LIPASE, BLOOD  URINALYSIS, ROUTINE W REFLEX MICROSCOPIC (NOT AT [facility])  Randolm Idol, ED    Imaging Review Dg Chest 2 View  08/22/2015   CLINICAL DATA:  Shortness of breath, 5 days duration.  EXAM: CHEST  2 VIEW  COMPARISON:  06/09/2014  FINDINGS: The heart is enlarged. The aorta is unfolded. There is interstitial and early alveolar pulmonary edema with a small amount of fluid in the fissures. No large effusion. No  significant bony finding.  IMPRESSION: Congestive heart failure/early pulmonary edema.   Electronically Signed   By: Nelson Chimes M.D.   On: 08/22/2015 13:17   I have personally reviewed and evaluated these images and lab results as part of my medical decision-making.   EKG Interpretation None      MDM   Final diagnoses:  Congestive heart failure, unspecified congestive heart failure chronicity, unspecified congestive heart failure type [facility])    64 year old male presents with edema to his face, upper extremities, lower extremities, and scrotum, which started 5 days ago. Denies fever, chills, lightheadedness, dizziness. Denies chest pain. Reports shortness of breath with exertion. Denies abdominal pain, nausea, vomiting, diarrhea, constipation, dysuria, urgency, frequency.  Patient is afebrile. Vital signs stable. O2  sat 96-98% on RA. Heart RRR. Rales to bases of lungs bilaterally. No respiratory distress. Abdomen mildly distended. No TTP, rebound, guarding, or masses. Edema to eyelids, hands, lower extremities bilaterally.  Patient given 40 mg lasix in the ED.  EKG no acute ischemia. Troponin negative.  CBC with hemoglobin 11.5. CMP with creatinine 1.32, albumin 2.1. Lipase within normal limits. UA pending. CXR from urgent care remarkable for CHF, early pulmonary edema.  Hospitalist consulted for admission. Spoke with Dr. Loleta Books. Patient to be admitted for further evaluation and management.  BP 154/102 mmHg  Pulse 49  Temp(Src) 98.3 F (36.8 C) (Oral)  Resp 11  Ht 6\' 2"  (1.88 m)  Wt 335 lb 1.6 oz (152 kg)  BMI 43.01 kg/m2  SpO2 96%      Marella Chimes, PA-C 08/22/15 [place], [place] 08/22/15 [place], MD 08/22/15 1535

## 2015-08-22 NOTE — H&P (Signed)
History and Physical  Gregory Diaz  HGD:[id number]  DOB: 12/20/1950  DOA: 08/22/2015  Referring physician: Lenell Antu, PA-C PCP: Purvis Kilts, MD   Chief Complaint: Leg swelling and dyspnea with exertion  HPI: Gregory Diaz is a 64 y.o. male with a past medical history significant for HTN, obesity, and chronic low back pain who presents withone week increased swelling in the legs scrotum and arms as well as shortness of breath on exertion. The patient describes symptoms of developed subacutely over the last 1-2 weeks. These have included progressive swelling in his legs, scrotum, hands, arms, belly, and face. He notes that he's gained 30 pounds in the last few weeks. He also notes that he has shortness of breath any exertion. He has pain in the legs and groin and low back, related to swelling.  In the ED, he was bradycardic and hypertensive and edematous. He had an increase in serum creatinine, mild normocytic anemia, low albumin, elevated BNP.  His chest x-ray was consistent with pulmonary edema and [facility] were asked to admit for acute CHF.   Review of Systems:  Patient seen 4:05 PM on 08/22/2015. Pt complains of generalized swelling, dyspnea on exertion. Pt denies any chest tightness or pressure, palpitations, orthopnea, paroxysmal nocturnal dyspnea.  No fevers, joint pains, rashes. All other systems negative except as just noted or noted in the history of present illness.  Past Medical History  Diagnosis Date  . Hypertension   . Heart murmur     was told at age 36, but has not had any problems  . Enlarged prostate   . Anxiety   . Early cataracts, bilateral   . Arthritis     DDD, low back  The above past medical history was reviewed.  The patient has no reported history of CAD, but a Cardiolite test at [facility] in 2015 showed fixed defect in mid inferior segment.  Past Surgical History  Procedure Laterality Date  . Hemorroidectomy    . Fracture surgery Right    thumb  . Colonoscopy    . Hernia repair      umbilical hernia repair  . Tonsillectomy    . Back surgery  2004, 2015  . Eye surgery      L eye- as a child   The above surgical history was reviewed.  Social History: Patient lives with his wife  and daughter in [place].  He is an active smoker.  He is a retired Administrator. He is independent with all ADLs.   Allergies  Allergen Reactions  . Penicillins Swelling    Has patient had a PCN reaction causing immediate rash, facial/tongue/throat swelling, SOB or lightheadedness with hypotension: Yes Has patient had a PCN reaction causing severe rash involving mucus membranes or skin necrosis: No Has patient had a PCN reaction that required hospitalization No Has patient had a PCN reaction occurring within the last 10 years: No If all of the above answers are "NO", then may proceed with Cephalosporin use.     Family History  Problem Relation Age of Onset  . Gallstones Father   . Ulcers Father   . Alcohol abuse Father   . Diabetes Daughter   . Diabetes Brother   . Diabetes Sister   No family history of renal disease or lupus.  Prior to Admission medications   Medication Sig Start Date End Date Taking? Authorizing Provider  lisinopril-hydrochlorothiazide (PRINZIDE,ZESTORETIC) 20-12.5 MG per tablet Take 1 tablet by mouth daily.   Yes Historical  Provider, MD  Naproxen Sod-Diphenhydramine (ALEVE PM) 220-25 MG TABS Take 3 tablets by mouth at bedtime as needed (pain).   Yes Historical Provider, MD  oxyCODONE-acetaminophen (PERCOCET) 10-325 MG tablet Take 1 tablet by mouth every 4 (four) hours as needed for pain.   Yes Historical Provider, MD    Physical Exam: BP 149/81 mmHg  Pulse 52  Temp(Src) 98.3 F (36.8 C) (Oral)  Resp 17  Ht 6\' 2"  (1.88 m)  Wt 152 kg (335 lb 1.6 oz)  BMI 43.01 kg/m2  SpO2 97% General appearance:  Obese adult male, alert and in no distress.  Responds appropriately to questions.   Eyes: Sclerae normal with  slight icterus, conjunctiva pink, lids  edematous.  PERRL and EOMI.   Nose: No deformity, discharge, or epistaxis.   Mouth: OP moist without erythema, exudates, cobblestoning, or ulcers.  No airway deformities.   Lymph: No cervical, supraclavicular lymphadenopathy. Skin: Warm and dry.  No jaundice.  No suspicious rashes or lesions. Cardiac: RRR, nl X3-A3, soft systolic murmur.  Capillary refill is less than 2 seconds.  JVP not visible due to habitus.  Non-pitting arm edema bilaterally.  3+ pitting edema to knees bilaterally with pitting to thighs.  Scrotal edema.  Radial pulses 2+ and symmetric. Respiratory: Normal respiratory rate and rhythm.  CTAB without rales or wheezes.  Diminished at right base. Abdomen: BS present.  Abdomen soft without rigidity.  No TTP or rebound all quadrants.  Neuro: Sensorium intact.  Speech is fluent.  Attention and concentration are normal.  Memory seems intact.  Moves all extremities equally and with normal coordination.    Psych: Appropriate affect.  Speech normal. Thought content/process linear/appropriate.  No evidence of aural or visual hallucinations or delusions.       Labs on Admission:  The metabolic panel is notable for normal sodium, potassium and bicarbonate.  the serum creatinine is 1.32 mg/dL from a baseline of 0.8   the albumin is 2.1 g/dL. There is no elevation in transaminases are bilirubin. The lipase is normal. The BNP is elevated at 410 pg/mL. The troponin is normal. The urinalysis is notable for blood and protein on dipstick. The complete blood count is notable for no leukocytosis.  Normocytic anemia, new.   Radiological Exams on Admission: Personally reviewed: Dg Chest 2 View 08/22/2015 Question pulmonary edema, bilaterally.   Nuclear perfusion study August 2015: Report reviewed and Everywhere shows EF 45% and inferior ischemia in fixed pattern.   EKG: Independently reviewed. NSR, with inferolateral TWI stable from  2015.       Assessment/Plan Principal Problem:   Heart failure with acute decompensation, type unknown (HCC) Active Problems:   Hypertension   Spinal stenosis at L4-L5 level   Acute CHF (congestive heart failure) ([place])  1. Pulmonary edema:  This is new.  The patient has dyspnea on exertion, lower extreme swelling, and elevated BNP in the context of a EF of 45% one year ago. He reports waking up 30 pounds last several weeks. -Furosemide 40 mg twice daily -Echocardiogram -Goal net -1.5 L daily and dry weight around 300 pounds -Telemetry -Strict I's and O's, daily weights   2. Acute kidney injury with proteinuria and hematuria:  This is new.  The patient's dipstick shows proteinuria and hematuria. His previous serum creatinine is normal.  -Urinalysis with microscopy is ordered -PC ratio was ordered -Urine electrolytes are ordered -Repeat BMP -Hold nephrotoxins including lisinopril and meloxicam  3. Normocytic anemia: This was not present several months ago. -Iron  studies, vitamin B12 and folate, and reticulocyte count are ordered -LDH and haptoglobin are ordered to assess for hemolysis  4. Hypoalbuminemia:  The patient has low albumin in the setting of what appears to be large protein on dipstick.   5. Hypertension: Not at goal. -Continue hydrochlorothiazide  -hold home lisinopril given renal failure -start amlodipine 5 mg daily   6. Low back pain: Stable.  -Percocet as needed for pain -Hold meloxicam       DVT PPx: Lovenox Diet: Heart healthy Code Status: Full Family Communication: The patient's differential diagnosis and expected treatment were discussed with the patient's daughter and son-in-law present at the bedside.  Code status was confirmed.  All questions were answered.  Disposition Plan:  At the time of admission, it appears that the appropriate admission status for this patient is INPATIENT. This is judged to be reasonable and necessary in order to  provide the required intensity of service to ensure the patient's safety given the presenting symptoms, physical exam findings, and initial radiographic and laboratory data in the context of their chronic comorbidities.  Together, these circumstances are felt to place her/him at high risk for further clinical deterioration. The following factors support the admission status of inpatient:   A. The patient's presenting symptoms include SOB on exertion, swelling. B. The worrisome physical exam findings include edema, generalized.   C. The initial radiographic and laboratory data are worrisome because of elevated creatinine, pulmonary edema, elevated BNP, anemia, proteinuria. D. The chronic co-morbidities include HTN, CAD, chronic low back pain. E. Patient requires inpatient status due to high intensity of service, high risk for further deterioration and high frequency of surveillance required. F. I certify that at the point of admission it is my clinical judgment that the patient will require inpatient hospital care spanning beyond 2 midnights from the point of admission.    Edwin Dada [facility] Pager [phone]

## 2015-08-22 NOTE — Progress Notes (Signed)
Pt noted to have apnea while asleep. O2 2l [place] placed while asleep

## 2015-08-23 ENCOUNTER — Inpatient Hospital Stay (HOSPITAL_COMMUNITY): Payer: Medicare HMO

## 2015-08-23 DIAGNOSIS — D649 Anemia, unspecified: Secondary | ICD-10-CM

## 2015-08-23 DIAGNOSIS — R0600 Dyspnea, unspecified: Secondary | ICD-10-CM

## 2015-08-23 DIAGNOSIS — E8809 Other disorders of plasma-protein metabolism, not elsewhere classified: Secondary | ICD-10-CM

## 2015-08-23 LAB — VITAMIN B12: Vitamin B-12: 334 pg/mL (ref 180–914)

## 2015-08-23 LAB — IRON AND TIBC
Iron: 36 ug/dL — ABNORMAL LOW (ref 45–182)
Saturation Ratios: 17 % — ABNORMAL LOW (ref 17.9–39.5)
TIBC: 206 ug/dL — ABNORMAL LOW (ref 250–450)
UIBC: 170 ug/dL

## 2015-08-23 LAB — BASIC METABOLIC PANEL
Anion gap: 6 (ref 5–15)
BUN: 24 mg/dL — ABNORMAL HIGH (ref 6–20)
CO2: 30 mmol/L (ref 22–32)
Calcium: 7.8 mg/dL — ABNORMAL LOW (ref 8.9–10.3)
Chloride: 106 mmol/L (ref 101–111)
Creatinine, Ser: 1.3 mg/dL — ABNORMAL HIGH (ref 0.61–1.24)
GFR calc Af Amer: >60 mL/min (ref >60)
GFR calc non Af Amer: 56 mL/min — ABNORMAL LOW (ref >60)
Glucose, Bld: 100 mg/dL — ABNORMAL HIGH (ref 65–99)
Potassium: 3.7 mmol/L (ref 3.5–5.1)
Sodium: 142 mmol/L (ref 135–145)

## 2015-08-23 LAB — HAPTOGLOBIN: Haptoglobin: 397 mg/dL — ABNORMAL HIGH (ref 34–200)

## 2015-08-23 LAB — RETICULOCYTES
RBC.: 4.06 MIL/uL — ABNORMAL LOW (ref 4.22–5.81)
Retic Count, Absolute: 32.5 10*3/uL (ref 19.0–186.0)
Retic Ct Pct: 0.8 % (ref 0.4–3.1)

## 2015-08-23 LAB — FERRITIN: Ferritin: 116 ng/mL (ref 24–336)

## 2015-08-23 MED ORDER — ENOXAPARIN SODIUM 80 MG/0.8ML ~~LOC~~ SOLN
75.0000 mg | SUBCUTANEOUS | Status: DC
  Administered 2015-08-23: 75 mg via SUBCUTANEOUS
  Filled 2015-08-23: qty 0.8

## 2015-08-23 MED ORDER — FUROSEMIDE 10 MG/ML IJ SOLN
40.0000 mg | Freq: Two times a day (BID) | INTRAMUSCULAR | Status: DC
  Administered 2015-08-24: 40 mg via INTRAVENOUS
  Filled 2015-08-23: qty 4

## 2015-08-23 MED ORDER — ACETAMINOPHEN 325 MG PO TABS
650.0000 mg | ORAL_TABLET | Freq: Four times a day (QID) | ORAL | Status: DC | PRN
  Administered 2015-08-23 (×2): 650 mg via ORAL
  Filled 2015-08-23: qty 2

## 2015-08-23 NOTE — Progress Notes (Signed)
[facility]                                                                              Patient Demographics  Gregory Diaz, is a 64 y.o. male, DOB - [birth year], UQJ:[id number]  Admit date - 08/22/2015   Admitting Physician Edwin Dada, MD  Outpatient Primary MD for the patient is Purvis Kilts, MD  LOS - 1   Chief Complaint  Patient presents with  . Shortness of Breath  . Edema      HPI on 08/22/2015 by Dr. Bartholomew Crews Zingg is a 65 y.o. male with a past medical history significant for HTN, obesity, and chronic low back pain who presents withone week increased swelling in the legs scrotum and arms as well as shortness of breath on exertion. The patient describes symptoms of developed subacutely over the last 1-2 weeks. These have included progressive swelling in his legs, scrotum, hands, arms, belly, and face. He notes that he's gained 30 pounds in the last few weeks. He also notes that he has shortness of breath any exertion. He has pain in the legs and groin and low back, related to swelling. In the ED, he was bradycardic and hypertensive and edematous. He had an increase in serum creatinine, mild normocytic anemia, low albumin, elevated BNP. His chest x-ray was consistent with pulmonary edema and [facility] were asked to admit for acute CHF.  Assessment & Plan   Pulmonary Edema -CXR: CHF, early pulm edema -Patient has no history of CHF -BNP 410 -Echocardiogram pending -monitor daily weights, intake/output.   -Urine output over past 24hrs 2775cc  Acute kidney injury with proteinuria and hematuria -Likely secondary to questionable dehydration versus medications -Baseline hemoglobin approximately 0.9, 1.37 upon admission -Creatinine currently 1.3, continue to monitor BMP  Normocytic anemia -Anemia panel: Iron 36, ferritin 116, saturation ratio 17, vitamin B12 334 -LDH 148,  Haptoglobin pending -Hb baseline 14, currently 11 -Continue to  monitor CBC -will obtain FOBT  Hypoalbuminemia -Will consult nutrition  Hypertension -Lisinopril held -Continue lasix, amlodipine  Low back pain -Meloxicam held due to acute renal injury -Continue Percocet PRN  Code Status: Full  Family Communication: daughter at bedside  Disposition Plan: Admitted  Time Spent in minutes   30 minutes  Procedures  Echocardiogram  Consults   None  DVT Prophylaxis  lovenox  Lab Results  Component Value Date   PLT 294 08/22/2015    Medications  Scheduled Meds: . amLODipine  5 mg Oral Daily  . enoxaparin (LOVENOX) injection  75 mg Subcutaneous Q24H  . furosemide  40 mg Intravenous BID  . hydrochlorothiazide  12.5 mg Oral Daily  . sodium chloride  3 mL Intravenous Q12H   Continuous Infusions:  PRN Meds:.acetaminophen, bisacodyl, oxyCODONE-acetaminophen **AND** oxyCODONE, senna-docusate  Antibiotics    Anti-infectives    None     Subjective:   Daelan Hernan seen and examined today.  Patient denies chest pain.  Feels his breathing and leg swelling have improved Patient denies dizziness, headache, abdominal pain, N/V/D/C.  Objective:   Filed Vitals:   08/22/15 2246 08/23/15 0016 08/23/15 0447 08/23/15 0928  BP: 159/73 163/74 166/79 156/101  Pulse: 58 62  68 50  Temp: 98.6 F (37 C) 98.3 F (36.8 C) 98.4 F (36.9 C) 98.2 F (36.8 C)  TempSrc: Oral Oral Oral Oral  Resp: 17 18 20 18   Height:      Weight:   149.948 kg (330 lb 9.2 oz)   SpO2: 100% 100% 100% 98%    Wt Readings from Last 3 Encounters:  08/23/15 149.948 kg (330 lb 9.2 oz)  08/22/15 152.409 kg (336 lb)  05/31/15 139.663 kg (307 lb 14.4 oz)     Intake/Output Summary (Last 24 hours) at 08/23/15 1323 Last data filed at 08/23/15 0931  Gross per 24 hour  Intake    240 ml  Output   3075 ml  Net  -2835 ml    Exam  General: Well developed, well nourished, NAD, appears stated age  HEENT: NCAT, mucous membranes moist.   Cardiovascular: S1 S2  auscultated, +SEM, Regular rate and rhythm.  Respiratory: Clear to auscultation bilaterally with equal chest rise  Abdomen: Soft, nontender, nondistended, + bowel sounds  Extremities: warm dry without cyanosis clubbing. Trace pedal edema.   Neuro: AAOx3, nonfocal  Psych: Normal affect and demeanor with intact judgement and insight  Data Review   Micro Results No results found for this or any previous visit (from the past 240 hour(s)).  Radiology Reports Dg Chest 2 View  08/22/2015   CLINICAL DATA:  Shortness of breath, 5 days duration.  EXAM: CHEST  2 VIEW  COMPARISON:  06/09/2014  FINDINGS: The heart is enlarged. The aorta is unfolded. There is interstitial and early alveolar pulmonary edema with a small amount of fluid in the fissures. No large effusion. No significant bony finding.  IMPRESSION: Congestive heart failure/early pulmonary edema.   Electronically Signed   By: Nelson Chimes M.D.   On: 08/22/2015 13:17    CBC  Recent Labs Lab 08/22/15 1224 08/22/15 1346 08/22/15 1728  WBC 7.8 7.6 7.7  HGB 11.2* 11.5* 11.0*  HCT 35.9* 35.6* 34.1*  PLT  --  291 294  MCV 82.9 86.2 87.0  MCH 25.8* 27.8 28.1  MCHC 31.1* 32.3 32.3  RDW  --  12.8 12.9    Chemistries   Recent Labs Lab 08/22/15 1201 08/22/15 1346 08/22/15 1728 08/23/15 0337  NA 144 138  --  142  K 4.1 3.8  --  3.7  CL 108 105  --  106  CO2 29 27  --  30  GLUCOSE 96 104*  --  100*  BUN 27* 25*  --  24*  CREATININE 1.28* 1.32* 1.37* 1.30*  CALCIUM 7.9* 7.8*  --  7.8*  AST 22 24  --   --   ALT 22 23  --   --   ALKPHOS 81 80  --   --   BILITOT 0.5 0.6  --   --    ------------------------------------------------------------------------------------------------------------------ estimated creatinine clearance is 86.4 mL/min (by C-G formula based on Cr of 1.3). ------------------------------------------------------------------------------------------------------------------ No results for input(s): HGBA1C in  the last 72 hours. ------------------------------------------------------------------------------------------------------------------ No results for input(s): CHOL, HDL, LDLCALC, TRIG, CHOLHDL, LDLDIRECT in the last 72 hours. ------------------------------------------------------------------------------------------------------------------  Recent Labs  08/22/15 1201  TSH 1.917   ------------------------------------------------------------------------------------------------------------------  Recent Labs  08/23/15 0337  VITAMINB12 334  FERRITIN 116  TIBC 206*  IRON 36*  RETICCTPCT 0.8    Coagulation profile No results for input(s): INR, PROTIME in the last 168 hours.  No results for input(s): DDIMER in the last 72 hours.  Cardiac Enzymes No results for  input(s): CKMB, TROPONINI, MYOGLOBIN in the last 168 hours.  Invalid input(s): CK ------------------------------------------------------------------------------------------------------------------ Invalid input(s): POCBNP    Ahman Dugdale D.O. on 08/23/2015 at 1:23 PM  Between 7am to 7pm - Pager - [phone]  After 7pm go to [email or web address] - password [email or web address]  And look for the night coverage person covering for me after hours  [facility] Office  [phone]

## 2015-08-23 NOTE — Clinical Documentation Improvement (Signed)
Hospitalist  Can the diagnosis of CHF be further specified?    Acuity - Acute, Chronic, Acute on Chronic   Type - Systolic, Diastolic, Systolic and Diastolic  Other  Clinically Undetermined  Document any associated diagnoses/conditions  Supporting Information: "elevated BNP. His chest x-ray was consistent with pulmonary edema and [facility] were asked to admit for acute CHF. "  Please exercise your independent, professional judgment when responding. A specific answer is not anticipated or expected.  Thank You, Alessandra Grout, RN, BSN, CCDS,Clinical Documentation Specialist:  [phone]  [phone]=[facility] [place]- Health Information Management

## 2015-08-23 NOTE — Progress Notes (Signed)
  Echocardiogram 2D Echocardiogram has been performed.  Gregory Diaz M 08/23/2015, 11:18 AM

## 2015-08-24 ENCOUNTER — Inpatient Hospital Stay (HOSPITAL_COMMUNITY): Payer: Medicare HMO

## 2015-08-24 DIAGNOSIS — N049 Nephrotic syndrome with unspecified morphologic changes: Secondary | ICD-10-CM | POA: Insufficient documentation

## 2015-08-24 DIAGNOSIS — M549 Dorsalgia, unspecified: Secondary | ICD-10-CM

## 2015-08-24 LAB — LIPID PANEL
Cholesterol: 201 mg/dL — ABNORMAL HIGH (ref 0–200)
HDL: 35 mg/dL — ABNORMAL LOW (ref >40)
LDL Cholesterol: 137 mg/dL — ABNORMAL HIGH (ref 0–99)
Total CHOL/HDL Ratio: 5.7 RATIO
Triglycerides: 144 mg/dL (ref <150)
VLDL: 29 mg/dL (ref 0–40)

## 2015-08-24 LAB — GLUCOSE, CAPILLARY
Glucose-Capillary: 123 mg/dL — ABNORMAL HIGH (ref 65–99)
Glucose-Capillary: 89 mg/dL (ref 65–99)
Glucose-Capillary: 133 mg/dL — ABNORMAL HIGH (ref 65–99)

## 2015-08-24 LAB — CBC
HCT: 37.6 % — ABNORMAL LOW (ref 39.0–52.0)
Hemoglobin: 11.9 g/dL — ABNORMAL LOW (ref 13.0–17.0)
MCH: 27.2 pg (ref 26.0–34.0)
MCHC: 31.6 g/dL (ref 30.0–36.0)
MCV: 85.8 fL (ref 78.0–100.0)
Platelets: 339 10*3/uL (ref 150–400)
RBC: 4.38 MIL/uL (ref 4.22–5.81)
RDW: 12.7 % (ref 11.5–15.5)
WBC: 6.4 10*3/uL (ref 4.0–10.5)

## 2015-08-24 LAB — FOLATE RBC
Folate, Hemolysate: 283.4 ng/mL
Folate, RBC: 849 ng/mL (ref >498)
Hematocrit: 33.4 % — ABNORMAL LOW (ref 37.5–51.0)

## 2015-08-24 LAB — HEMOGLOBIN A1C
Hgb A1c MFr Bld: 6.6 % — ABNORMAL HIGH (ref 4.8–5.6)
Mean Plasma Glucose: 143 mg/dL

## 2015-08-24 LAB — BASIC METABOLIC PANEL
Anion gap: 9 (ref 5–15)
BUN: 25 mg/dL — ABNORMAL HIGH (ref 6–20)
CO2: 28 mmol/L (ref 22–32)
Calcium: 8.1 mg/dL — ABNORMAL LOW (ref 8.9–10.3)
Chloride: 104 mmol/L (ref 101–111)
Creatinine, Ser: 1.28 mg/dL — ABNORMAL HIGH (ref 0.61–1.24)
GFR calc Af Amer: >60 mL/min (ref >60)
GFR calc non Af Amer: 57 mL/min — ABNORMAL LOW (ref >60)
Glucose, Bld: 99 mg/dL (ref 65–99)
Potassium: 4.1 mmol/L (ref 3.5–5.1)
Sodium: 141 mmol/L (ref 135–145)

## 2015-08-24 MED ORDER — ENOXAPARIN SODIUM 80 MG/0.8ML ~~LOC~~ SOLN: 75.0000 mg | SUBCUTANEOUS | Status: DC

## 2015-08-24 MED ORDER — FUROSEMIDE 10 MG/ML IJ SOLN
80.0000 mg | Freq: Three times a day (TID) | INTRAMUSCULAR | Status: DC
  Administered 2015-08-24 – 2015-08-25 (×3): 80 mg via INTRAVENOUS
  Filled 2015-08-24 (×3): qty 8

## 2015-08-24 MED ORDER — INSULIN ASPART 100 UNIT/ML ~~LOC~~ SOLN
0.0000 [IU] | Freq: Three times a day (TID) | SUBCUTANEOUS | Status: DC
  Administered 2015-08-24 – 2015-08-27 (×2): 1 [IU] via SUBCUTANEOUS

## 2015-08-24 NOTE — Progress Notes (Signed)
Nutrition Brief Note  RD consulted to assessment of Nutrition Status/Requirements. Per MD note, nutrition consult due to hypoalbuminemia. Albumin has a half-life of 21 days and is strongly affected by stress response and inflammatory process, therefore, do not expect to see an improvement in this lab value during acute hospitalization.   Wt Readings from Last 15 Encounters:  08/24/15 328 lb 6.4 oz (148.961 kg)  08/22/15 336 lb (152.409 kg)  05/31/15 307 lb 14.4 oz (139.663 kg)  05/25/15 298 lb 14.4 oz (135.58 kg)  03/11/15 290 lb (131.543 kg)  01/26/15 299 lb (135.626 kg)  10/26/14 300 lb (136.079 kg)  10/19/14 299 lb (135.626 kg)  10/07/14 299 lb 1.6 oz (135.671 kg)  06/09/14 309 lb 4.9 oz (140.3 kg)  01/28/13 302 lb 9.6 oz (137.258 kg)  01/26/13 299 lb (135.626 kg)  03/31/12 321 lb 12.8 oz (145.968 kg)    Body mass index is 42.15 kg/(m^2). Patient meets criteria for Morbid Obesity based on current BMI.   Current diet order is Heart Healthy, patient is consuming approximately 100% of meals at this time. Pt asleep at time of visit. Pt's daughter at bedside woke patient up to speak with RD. Pt reports having a good appetite and eating well PTA. He eats 3 meals and 2 snacks daily and has protein-rich foods at most meals. Pt has no signs of muscle or fat wasting. Hypoalbuminemia is likely related to fluid retention.  He states that he was told about low sodium diet. Pt had difficulty staying awake and daughter at bedside provided additional information. Pt is on the road a lot and eats a lot of fast food. Pt's wife cooks meals at home that are low in sodium and contain vegetables but, patient adds salt to everything. Daughter states that patient would probably benefit from additional low sodium education. Pt asleep. RD left name and contact information and will follow-up for education.   Labs and medications reviewed.    Scarlette Ar RD, LDN Inpatient Clinical Dietitian Pager:  [phone] After Hours Pager: [phone]

## 2015-08-24 NOTE — Evaluation (Signed)
Occupational Therapy Evaluation Patient Details Name: Gregory Diaz MRN: [id number] DOB: [birth year] Today's Date: 08/24/2015    History of Present Illness Gregory Diaz is a 64 y.o. male with a past medical history significant for HTN, obesity, and chronic low back pain with prior PLIF who presents with one week increased swelling in the legs scrotum and arms as well as shortness of breath on exertion. Pt with acute CHF   Clinical Impression   Patient presenting with deconditioning secondary to above. Patient independent PTA. Patient currently functioning at an overall supervision level. Patient will benefit from acute OT to increase overall independence in the areas of functional mobility, energy conservation education, and overall safety in order to safely discharge home.     Follow Up Recommendations  No OT follow up;Supervision - Intermittent    Equipment Recommendations  3 in 1 bedside comode    Recommendations for Other Services  None at this time    Precautions / Restrictions Precautions Precautions: Fall Restrictions Weight Bearing Restrictions: No    Mobility Bed Mobility Overal bed mobility: Modified Independent General bed mobility comments: reliance on rail to rise from side to sit with cues  Transfers Overall transfer level: Needs assistance Equipment used: None Transfers: Sit to/from Stand Sit to Stand: Supervision General transfer comment: supervision for safety    Balance Overall balance assessment: Needs assistance Sitting-balance support: No upper extremity supported;Feet supported Sitting balance-Leahy Scale: Good     Standing balance support: No upper extremity supported;During functional activity Standing balance-Leahy Scale: Good    ADL Overall ADL's : Needs assistance/impaired General ADL Comments: Pt able to cross BLEs for LB ADLs. Encouraged possible use of LH sponge if pt stands during his shower as an energy conservation strategy. Pt  ambulated to/from BR without use of an AD. Pt can benefit from 3-in-1 for safety and independence with toileting tasks. At end of session, left pt supine in bed with BLEs elevated on pillows. Encouraged elevation and movement/exercise to decrease swelling in LEs.     Pertinent Vitals/Pain Pain Assessment: No/denies pain     Hand Dominance Right   Extremity/Trunk Assessment Upper Extremity Assessment Upper Extremity Assessment: Overall WFL for tasks assessed   Lower Extremity Assessment Lower Extremity Assessment: Overall WFL for tasks assessed   Cervical / Trunk Assessment Cervical / Trunk Assessment: Normal   Communication Communication Communication: No difficulties   Cognition Arousal/Alertness: Awake/alert Behavior During Therapy: WFL for tasks assessed/performed Overall Cognitive Status: Within Functional Limits for tasks assessed              Home Living Family/patient expects to be discharged to:: Private residence Living Arrangements: Spouse/significant other Available Help at Discharge: Family;Available PRN/intermittently Type of Home: House Home Access: Stairs to enter [employer] of Steps: 2 Entrance Stairs-Rails: None Home Layout: One level     Bathroom Shower/Tub: Tub/shower unit;Curtain   Bathroom Toilet: Handicapped height     Home Equipment: Environmental consultant - 2 wheels;Cane - single point;Crutches;Shower seat;Hand held shower head   Additional Comments: Wife works part time. 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    OT Diagnosis: Generalized weakness   OT Problem List: Decreased strength;Decreased activity tolerance;Decreased safety awareness;Decreased knowledge of use of DME or AE   OT Treatment/Interventions: Self-care/ADL training;Energy conservation;DME and/or AE instruction;Therapeutic activities;Patient/family  education    OT Goals(Current goals can be found in the care  plan section) Acute Rehab OT Goals Patient Stated Goal: return home OT Goal Formulation: With patient Time For Goal Achievement: 09/07/15 Potential to Achieve Goals: Good ADL Goals Pt Will Perform Grooming: Independently;standing Pt Will Transfer to Toilet: with modified independence;ambulating Pt Will Perform Tub/Shower Transfer: Tub transfer;with modified independence Additional ADL Goal #1: Pt will be independent with functional mobility Additional ADL Goal #2: Pt will be educated on energy conservation handout and be able to independently verbalize at least 4 techniques to increase overall independence and safety  OT Frequency: Min 2X/week   Barriers to D/C: None known at this time   End of Session Activity Tolerance: Patient tolerated treatment well Patient left: in bed;with call bell/phone within reach   Time: 1500-1521 OT Time Calculation (min): 21 min Charges:  OT General Charges $OT Visit: 1 Procedure OT Evaluation $Initial OT Evaluation Tier I: 1 Procedure  Marithza Malachi , MS, OTR/L, CLT Pager: [phone]  08/24/2015, 4:07 PM

## 2015-08-24 NOTE — Consult Note (Signed)
Reason for Consult:  Proteinuria and possible nephrotic syndrome Referring Physician: Ree Kida, MD  Gregory Diaz is an 64 y.o. male.  HPI: Pt is a 65yo M with PMH sig for HTN, BPH, and DDD of low back s/p fusion laminectomy of L3-4 on 05/20/15 who presented to urgent care with 2 month history of increasing extremity edema (both upper and lower) associated with pain on ambulation as well as SOB.  He has had difficulty with ambulation due to pain and has also noticed testicular swelling.  He was admitted on 08/22/15 for presumed congestive heart failure, however his ECHO was normal and he was noted to have new onset proteinuria and drop in his albumin.  We were consulted to further evaluate the possibility of nephrotic syndrome and offer management.  Of note, he has been taking NSAIDs since his back surgery 3 months ago along with ACE inhibitor and HCTZ.  He has no previous history of kidney disease but his daughter has a solitary kidney and CKD stage 2 (followed by Dr. Lorrene Reid).  He denies any foamy/frothy urine, hematuria, pyuria, or dysuria.   Trend in Creatinine:  CREATININE, SER  Date/Time Value Ref Range Status  08/24/2015 07:26 AM 1.28* 0.61 - 1.24 mg/dL Final  08/23/2015 03:37 AM 1.30* 0.61 - 1.24 mg/dL Final  08/22/2015 05:28 PM 1.37* 0.61 - 1.24 mg/dL Final  08/22/2015 01:46 PM 1.32* 0.61 - 1.24 mg/dL Final  05/25/2015 11:13 AM 1.07 0.61 - 1.24 mg/dL Final  03/11/2015 02:25 PM 0.86 0.50 - 1.35 mg/dL Final  10/07/2014 08:57 AM 0.93 0.50 - 1.35 mg/dL Final  06/09/2014 12:25 PM 0.98 0.50 - 1.35 mg/dL Final    PMH:   Past Medical History  Diagnosis Date  . Hypertension   . Heart murmur     was told at age 72, but has not had any problems  . Enlarged prostate   . Anxiety   . Early cataracts, bilateral   . Arthritis     DDD, low back    PSH:   Past Surgical History  Procedure Laterality Date  . Hemorroidectomy    . Fracture surgery Right     thumb  . Colonoscopy    . Hernia  repair      umbilical hernia repair  . Tonsillectomy    . Back surgery  2004, 2015  . Eye surgery      L eye- as a child     Allergies:  Allergies  Allergen Reactions  . Penicillins Swelling    Has patient had a PCN reaction causing immediate rash, facial/tongue/throat swelling, SOB or lightheadedness with hypotension: Yes Has patient had a PCN reaction causing severe rash involving mucus membranes or skin necrosis: No Has patient had a PCN reaction that required hospitalization No Has patient had a PCN reaction occurring within the last 10 years: No If all of the above answers are "NO", then may proceed with Cephalosporin use.     Medications:   Prior to Admission medications   Medication Sig Start Date End Date Taking? Authorizing Provider  lisinopril-hydrochlorothiazide (PRINZIDE,ZESTORETIC) 20-12.5 MG per tablet Take 1 tablet by mouth daily.   Yes Historical Provider, MD  Naproxen Sod-Diphenhydramine (ALEVE PM) 220-25 MG TABS Take 3 tablets by mouth at bedtime as needed (pain).   Yes Historical Provider, MD  oxyCODONE-acetaminophen (PERCOCET) 10-325 MG tablet Take 1 tablet by mouth every 4 (four) hours as needed for pain.   Yes Historical Provider, MD    Inpatient medications: . amLODipine  5 mg Oral Daily  . enoxaparin (LOVENOX) injection  75 mg Subcutaneous Q24H  . furosemide  40 mg Intravenous BID  . insulin aspart  0-9 Units Subcutaneous TID WC  . sodium chloride  3 mL Intravenous Q12H    Discontinued Meds:   Medications Discontinued During This Encounter  Medication Reason  . cyclobenzaprine (FLEXERIL) 10 MG tablet Completed Course  . acetaminophen (TYLENOL) 500 MG tablet Change in therapy  . HYDROcodone-acetaminophen (NORCO/VICODIN) 5-325 MG per tablet Change in therapy  . Multiple Vitamins-Minerals (MULTIVITAMIN & MINERAL PO) Patient has not taken in last 30 days  . Naproxen Sod-Diphenhydramine (ALEVE PM) 220-25 MG TABS Patient has not taken in last 30 days  .  naproxen sodium (ANAPROX) 220 MG tablet Change in therapy  . oxyCODONE-acetaminophen (PERCOCET) 10-325 MG per tablet 1 tablet Reorder  . enoxaparin (LOVENOX) injection 40 mg   . hydrochlorothiazide (MICROZIDE) capsule 12.5 mg   . furosemide (LASIX) injection 40 mg     Social History:  reports that he has been smoking Cigarettes.  He has a 50 pack-year smoking history. He has never used smokeless tobacco. He reports that he does not drink alcohol or use illicit drugs.  Family History:   Family History  Problem Relation Age of Onset  . Gallstones Father   . Ulcers Father   . Alcohol abuse Father   . Diabetes Daughter   . Diabetes Brother   . Diabetes Sister     Pertinent items are noted in HPI. Weight change: -3.039 kg (-6 lb 11.2 oz)  Intake/Output Summary (Last 24 hours) at 08/24/15 1145 Last data filed at 08/24/15 0745  Gross per 24 hour  Intake    684 ml  Output    790 ml  Net   -106 ml   BP 160/84 mmHg  Pulse 63  Temp(Src) 97.8 F (36.6 C) (Oral)  Resp 18  Ht 6\' 2"  (1.88 m)  Wt 148.961 kg (328 lb 6.4 oz)  BMI 42.15 kg/m2  SpO2 100% Filed Vitals:   08/23/15 1402 08/23/15 1649 08/23/15 2022 08/24/15 0415  BP:  170/83 176/90 160/84  Pulse: 48 51 56 63  Temp:  97.7 F (36.5 C) 97.6 F (36.4 C) 97.8 F (36.6 C)  TempSrc:  Oral Oral Oral  Resp: 18 18 18 18   Height:      Weight:    148.961 kg (328 lb 6.4 oz)  SpO2: 91% 98% 98% 100%     General appearance: cooperative, fatigued, no distress and mildly obese Head: Normocephalic, without obvious abnormality, atraumatic Eyes: negative findings: conjunctivae and sclerae normal and corneas clear, positive findings: eyelids/periorbital: periorbital edema bilaterally Neck: no adenopathy, no carotid bruit, no JVD, supple, symmetrical, trachea midline and thyroid not enlarged, symmetric, no tenderness/mass/nodules Resp: clear to auscultation bilaterally Cardio: regular rate and rhythm, S1, S2 normal, no murmur, click, rub  or gallop GI: soft, non-tender; bowel sounds normal; no masses,  no organomegaly Extremities: edema 1+ edema of upper and lower extremities  Labs: Basic Metabolic Panel:  Recent Labs Lab 08/22/15 1201 08/22/15 1346 08/22/15 1728 08/23/15 0337 08/24/15 0726  NA 144 138  --  142 141  K 4.1 3.8  --  3.7 4.1  CL 108 105  --  106 104  CO2 29 27  --  30 28  GLUCOSE 96 104*  --  100* 99  BUN 27* 25*  --  24* 25*  CREATININE 1.28* 1.32* 1.37* 1.30* 1.28*  ALBUMIN 2.4* 2.1*  --   --   --  CALCIUM 7.9* 7.8*  --  7.8* 8.1*   Liver Function Tests:  Recent Labs Lab 08/22/15 1201 08/22/15 1346  AST 22 24  ALT 22 23  ALKPHOS 81 80  BILITOT 0.5 0.6  PROT 5.4* 5.4*  ALBUMIN 2.4* 2.1*    Recent Labs Lab 08/22/15 1346  LIPASE 39   No results for input(s): AMMONIA in the last 168 hours. CBC:  Recent Labs Lab 08/22/15 1224 08/22/15 1346 08/22/15 1728 08/24/15 0726  WBC 7.8 7.6 7.7 6.4  HGB 11.2* 11.5* 11.0* 11.9*  HCT 35.9* 35.6* 34.1* 37.6*  MCV 82.9 86.2 87.0 85.8  PLT  --  291 294 339   PT/INR: @LABRCNTIP (inr:5) Cardiac Enzymes: )No results for input(s): CKTOTAL, CKMB, CKMBINDEX, TROPONINI in the last 168 hours. CBG: No results for input(s): GLUCAP in the last 168 hours.  Iron Studies:  Recent Labs Lab 08/23/15 0337  IRON 36*  TIBC 206*  FERRITIN 116    Xrays/Other Studies: Dg Chest 2 View  08/22/2015   CLINICAL DATA:  Shortness of breath, 5 days duration.  EXAM: CHEST  2 VIEW  COMPARISON:  06/09/2014  FINDINGS: The heart is enlarged. The aorta is unfolded. There is interstitial and early alveolar pulmonary edema with a small amount of fluid in the fissures. No large effusion. No significant bony finding.  IMPRESSION: Congestive heart failure/early pulmonary edema.   Electronically Signed   By: Nelson Chimes M.D.   On: 08/22/2015 13:17   US Renal  08/24/2015   CLINICAL DATA:  Acute kidney injury  EXAM: RENAL / URINARY TRACT ULTRASOUND COMPLETE   COMPARISON:  None.  FINDINGS: Right Kidney:  Length: 15.6 cm. Numerous cysts in the upper and midpole all measuring less than or equal to 5 cm. No hydronephrosis. Normal echogenicity.  Left Kidney:  Length: 15.1 cm. Two lower pole cysts 1 measuring 4.4 cm in the other measuring 3.6 cm. No hydronephrosis. Normal echogenicity.  Bladder:  Appears normal for degree of bladder distention.  IMPRESSION: Bilateral simple cysts.  No acute abnormalities.   Electronically Signed   By: Skipper Cliche M.D.   On: 08/24/2015 08:28     Assessment/Plan: 1. Anasarca- normal EF, new onset nephrotic range proteinuria associated with drop in albumin concerning for nephrotic syndrome.  DDx includes minimal change disease, FSGS, and Membranous. Will initiate workup with serologies and 24 hour urine for protein, will also check SPEP/UPEP and will likely require renal biopsy.  For now will continue with IV diuresis, continue to hold NSAIDs (which have been associated with nephrotic syndrome/membranous).  Limit sodium and fluid intake.  Monitor daily weights and strict I's/O's.  2. HTN- BP elevated, will increase diuresis and titrate meds.   3. Obesity 4. Deconditioning 5. DDD s/p laminectomy- stop NSAIDs 6. Anemia- iron deficiency, replete and follow 7.   Gregory Diaz A 08/24/2015, 11:45 AM

## 2015-08-24 NOTE — Progress Notes (Signed)
Heart Failure Navigator Consult Note  Presentation: Gregory Diaz is a 64 y.o. male with a past medical history significant for HTN, obesity, and chronic low back pain who presents withone week increased swelling in the legs scrotum and arms as well as shortness of breath on exertion. The patient describes symptoms of developed subacutely over the last 1-2 weeks. These have included progressive swelling in his legs, scrotum, hands, arms, belly, and face. He notes that he's gained 30 pounds in the last few weeks. He also notes that he has shortness of breath any exertion. He has pain in the legs and groin and low back, related to swelling.  In the ED, he was bradycardic and hypertensive and edematous. He had an increase in serum creatinine, mild normocytic anemia, low albumin, elevated BNP. His chest x-ray was consistent with pulmonary edema and [facility] were asked to admit for acute CHF.    Past Medical History  Diagnosis Date  . Hypertension   . Heart murmur     was told at age 34, but has not had any problems  . Enlarged prostate   . Anxiety   . Early cataracts, bilateral   . Arthritis     DDD, low back    Social History   Social History  . Marital Status: Married    Spouse Name: N/A  . Number of Children: N/A  . Years of Education: N/A   Occupational History  . retire     2014; truck Geophysicist/field seismologist   Social History Main Topics  . Smoking status: Current Every Day Smoker -- 1.00 packs/day for 50 years    Types: Cigarettes  . Smokeless tobacco: Never Used  . Alcohol Use: No  . Drug Use: No  . Sexual Activity: Yes    Birth Control/ Protection: None   Other Topics Concern  . None   Social History Narrative    ECHO:Study Conclusions--08/23/15  - Left ventricle: The cavity size was mildly dilated. Wall thickness was normal. Systolic function was normal. The estimated ejection fraction was in the range of 55% to 60%. Wall motion was normal; there were no regional wall  motion abnormalities. Left ventricular diastolic function parameters were normal. - Aortic valve: Valve area (Vmax): 2.18 cm^2. - Mitral valve: There was mild regurgitation. - Left atrium: The atrium was mildly dilated. - Right atrium: The atrium was moderately dilated. - Tricuspid valve: There was moderate regurgitation. - Pulmonary arteries: PA peak pressure: 37 mm Hg (S).  Echocardiography. M-mode, complete 2D, spectral Doppler, and color Doppler. Birthdate: Patient birthdate: 1950-11-15. Age: Patient is 64 yr old. Sex: Gender: male.  BMI: 42.4 kg/m^2. Blood pressure:   166/76 Patient status: Inpatient. Study date: Study date: 08/23/2015. Study time: 10:49 AM. Location: Bedside.  BNP    Component Value Date/Time   BNP 410.3* 08/22/2015 1346    ProBNP No results found for: PROBNP   Education Assessment and Provision:  Detailed education and instructions provided on heart failure disease management including the following:  Signs and symptoms of Heart Failure When to call the physician Importance of daily weights Low sodium diet Fluid restriction Medication management Anticipated future follow-up appointments  Patient education given on each of the above topics.  Patient acknowledges understanding and acceptance of all instructions.  I spoke with Gregory Diaz regarding his new diagnosis of HF.  He denies ever being told that he has HF or "any heart problems".  He currently does not weigh.  I have encouraged daily weights and explained  the importance of them/ and how they relate to signs and symptoms of HF.  His daughter tells me that he continues to smoke.  He says that he "enjoys a cigarette now and again".   I have reinforced the need to stop smoking and how detrimental it is for his health.  I also emphasized a low sodium diet and high sodium foods to avoid.  He says that he is waiting to hear from the doctor exactly what the reason for his admission is.  He  does not currently have a cardiologist.  He sees a PCP in [place] and will follow-up there after hospitalization.  Education Materials:  "Living Better With Heart Failure" Booklet, Daily Weight Tracker Tool    High Risk Criteria for Readmission and/or Poor Patient Outcomes:   EF <30%- No 55-60%  2 or more admissions in 6 months- 2/6  Difficult social situation- No lives with wife and daughter in [place]  Demonstrates medication noncompliance- No--denies   Barriers of Care:  Knowledge and compliance.  He needs clear communication regarding his diagnosis and ongoing education.  Discharge Planning:   He plans to return to home with wife and daughter.  He needs ongoing education and compliance reinforcement.

## 2015-08-24 NOTE — Consult Note (Signed)
Chief Complaint: Patient was seen in consultation today for proteinuria Chief Complaint  Patient presents with  . Shortness of Breath  . Edema   at the request of Dr Marval Regal  Referring Physician(s): Dr Marval Regal  History of Present Illness: Hy Gregory Diaz is a 64 y.o. male   Pt suffering increasing B leg swelling; scrotum swelling Shortness of breath x 2 mo Anasarca Work up shows proteinuria; worrisome for nephrotic syndrome Request for random renal biopsy per Dr Marval Regal I have seen and examined pt  Past Medical History  Diagnosis Date  . Hypertension   . Heart murmur     was told at age 30, but has not had any problems  . Enlarged prostate   . Anxiety   . Early cataracts, bilateral   . Arthritis     DDD, low back    Past Surgical History  Procedure Laterality Date  . Hemorroidectomy    . Fracture surgery Right     thumb  . Colonoscopy    . Hernia repair      umbilical hernia repair  . Tonsillectomy    . Back surgery  2004, 2015  . Eye surgery      L eye- as a child     Allergies: Penicillins  Medications: Prior to Admission medications   Medication Sig Start Date End Date Taking? Authorizing Provider  lisinopril-hydrochlorothiazide (PRINZIDE,ZESTORETIC) 20-12.5 MG per tablet Take 1 tablet by mouth daily.   Yes Historical Provider, MD  Naproxen Sod-Diphenhydramine (ALEVE PM) 220-25 MG TABS Take 3 tablets by mouth at bedtime as needed (pain).   Yes Historical Provider, MD  oxyCODONE-acetaminophen (PERCOCET) 10-325 MG tablet Take 1 tablet by mouth every 4 (four) hours as needed for pain.   Yes Historical Provider, MD     Family History  Problem Relation Age of Onset  . Gallstones Father   . Ulcers Father   . Alcohol abuse Father   . Diabetes Daughter   . Diabetes Brother   . Diabetes Sister     Social History   Social History  . Marital Status: Married    Spouse Name: N/A  . Number of Children: N/A  . Years of Education: N/A    Occupational History  . retire     2014; truck Geophysicist/field seismologist   Social History Main Topics  . Smoking status: Current Every Day Smoker -- 1.00 packs/day for 50 years    Types: Cigarettes  . Smokeless tobacco: Never Used  . Alcohol Use: No  . Drug Use: No  . Sexual Activity: Yes    Birth Control/ Protection: None   Other Topics Concern  . None   Social History Narrative     Review of Systems: A 12 point ROS discussed and pertinent positives are indicated in the HPI above.  All other systems are negative.  Review of Systems  Constitutional: Positive for activity change, appetite change, fatigue and unexpected weight change. Negative for fever.  Respiratory: Positive for cough, shortness of breath and wheezing.   Cardiovascular: Positive for leg swelling.  Gastrointestinal: Positive for nausea and abdominal distention.  Genitourinary: Negative for difficulty urinating.  Musculoskeletal: Positive for gait problem. Negative for back pain.  Neurological: Positive for weakness.  Psychiatric/Behavioral: Negative for behavioral problems and confusion.    Vital Signs: BP 138/90 mmHg  Pulse 60  Temp(Src) 97.9 F (36.6 C) (Oral)  Resp 18  Ht 6\' 2"  (1.88 m)  Wt 328 lb 6.4 oz (148.961 kg)  BMI 42.15  kg/m2  SpO2 94%  Physical Exam  Constitutional: He is oriented to person, place, and time. He appears well-nourished.  Cardiovascular: Normal rate and regular rhythm.   No murmur heard. Pulmonary/Chest: Effort normal. He has wheezes.  Abdominal: Soft. Bowel sounds are normal. He exhibits distension. There is no tenderness.  Musculoskeletal: Normal range of motion. He exhibits edema.  Neurological: He is alert and oriented to person, place, and time.  Skin: Skin is warm and dry.  Psychiatric: He has a normal mood and affect. His behavior is normal. Judgment and thought content normal.  Nursing note reviewed.   Mallampati Score:  MD Evaluation Airway: WNL Heart: WNL Abdomen:  WNL Chest/ Lungs: WNL ASA  Classification: 3 Mallampati/Airway Score: One  Imaging: Dg Chest 2 View  08/22/2015   CLINICAL DATA:  Shortness of breath, 5 days duration.  EXAM: CHEST  2 VIEW  COMPARISON:  06/09/2014  FINDINGS: The heart is enlarged. The aorta is unfolded. There is interstitial and early alveolar pulmonary edema with a small amount of fluid in the fissures. No large effusion. No significant bony finding.  IMPRESSION: Congestive heart failure/early pulmonary edema.   Electronically Signed   By: Nelson Chimes M.D.   On: 08/22/2015 13:17   US Renal  08/24/2015   CLINICAL DATA:  Acute kidney injury  EXAM: RENAL / URINARY TRACT ULTRASOUND COMPLETE  COMPARISON:  None.  FINDINGS: Right Kidney:  Length: 15.6 cm. Numerous cysts in the upper and midpole all measuring less than or equal to 5 cm. No hydronephrosis. Normal echogenicity.  Left Kidney:  Length: 15.1 cm. Two lower pole cysts 1 measuring 4.4 cm in the other measuring 3.6 cm. No hydronephrosis. Normal echogenicity.  Bladder:  Appears normal for degree of bladder distention.  IMPRESSION: Bilateral simple cysts.  No acute abnormalities.   Electronically Signed   By: Skipper Cliche M.D.   On: 08/24/2015 08:28    Labs:  CBC:  Recent Labs  05/25/15 1113 08/22/15 1224 08/22/15 1346 08/22/15 1728 08/23/15 0337 08/24/15 0726  WBC 8.1 7.8 7.6 7.7  --  6.4  HGB 15.2 11.2* 11.5* 11.0*  --  11.9*  HCT 46.8 35.9* 35.6* 34.1* 33.4* 37.6*  PLT 263  --  291 294  --  339    COAGS: No results for input(s): INR, APTT in the last 8760 hours.  BMP:  Recent Labs  08/22/15 1201 08/22/15 1346 08/22/15 1728 08/23/15 0337 08/24/15 0726  NA 144 138  --  142 141  K 4.1 3.8  --  3.7 4.1  CL 108 105  --  106 104  CO2 29 27  --  30 28  GLUCOSE 96 104*  --  100* 99  BUN 27* 25*  --  24* 25*  CALCIUM 7.9* 7.8*  --  7.8* 8.1*  CREATININE 1.28* 1.32* 1.37* 1.30* 1.28*  GFRNONAA  --  55* 52* 56* 57*  GFRAA  --  >60 >60 >60 >60     LIVER FUNCTION TESTS:  Recent Labs  03/11/15 1425 08/22/15 1201 08/22/15 1346  BILITOT 0.8 0.5 0.6  AST 17 22 24   ALT 15 22 23   ALKPHOS 64 81 80  PROT 6.5 5.4* 5.4*  ALBUMIN 3.5 2.4* 2.1*    TUMOR MARKERS: No results for input(s): AFPTM, CEA, CA199, CHROMGRNA in the last 8760 hours.  Assessment and Plan:  Proteinuria Anasarca Poss nephrotic syndrome Nephrology requesting random renal bx Scheduled for 10/12 in Radiology Risks and Benefits discussed with the patient including,  but not limited to bleeding, infection, damage to adjacent structures or low yield requiring additional tests. All of the patient's questions were answered, patient is agreeable to proceed. Consent signed and in chart.   Thank you for this interesting consult.  I greatly enjoyed meeting Murice A Wigfall and look forward to participating in their care.  A copy of this report was sent to the requesting provider on this date.  Signed: Ikram Riebe A 08/24/2015, 3:41 PM   I spent a total of 40 Minutes    in face to face in clinical consultation, greater than 50% of which was counseling/coordinating care for nephrotic syndrome

## 2015-08-24 NOTE — Progress Notes (Addendum)
[facility]                                                                              Patient Demographics  Gregory Diaz, is a 64 y.o. male, DOB - 1951/08/21, PNT:[id number]  Admit date - 08/22/2015   Admitting Physician Edwin Dada, MD  Outpatient Primary MD for the patient is Purvis Kilts, MD  LOS - 2   Chief Complaint  Patient presents with  . Shortness of Breath  . Edema      HPI on 08/22/2015 by Dr. Bartholomew Crews Szczesny is a 64 y.o. male with a past medical history significant for HTN, obesity, and chronic low back pain who presents withone week increased swelling in the legs scrotum and arms as well as shortness of breath on exertion. The patient describes symptoms of developed subacutely over the last 1-2 weeks. These have included progressive swelling in his legs, scrotum, hands, arms, belly, and face. He notes that he's gained 30 pounds in the last few weeks. He also notes that he has shortness of breath any exertion. He has pain in the legs and groin and low back, related to swelling. In the ED, he was bradycardic and hypertensive and edematous. He had an increase in serum creatinine, mild normocytic anemia, low albumin, elevated BNP. His chest x-ray was consistent with pulmonary edema and [facility] were asked to admit for acute CHF.  Assessment & Plan   Pulmonary Edema -CXR: CHF, early pulm edema -Patient has no history of CHF -BNP 410 -Echocardiogram: EF 08-67%, diastolic function normal, PA pressure 104mmHg -monitor daily weights, intake/output.   -Will repeat CXR today  Acute kidney injury with proteinuria and hematuria -Likely secondary to questionable dehydration versus medications -Baseline creatinine approximately 0.9, 1.37 upon admission -Cr 1.28 today -Suspect nephrotic syndrome -Nephrology consulted and appreciated  Normocytic anemia -Anemia panel: Iron 36, ferritin 116, saturation ratio 17, vitamin B12 334 -LDH 148,   Haptoglobin 397 -Hb baseline 14, currently 11 -Repeat CBC pending this morning -FOBT pending  Hypoalbuminemia -Will consult nutrition  New diabetes mellitus, type 2 -hbA1c 6.6 -Will start on ISS and CBG monitoring  Hypertension -Lisinopril held -Continue lasix, amlodipine   Low back pain -Meloxicam held due to acute renal injury -Continue Percocet PRN  Code Status: Full  Family Communication: daughter at bedside  Disposition Plan: Admitted.  Pending further workup  Time Spent in minutes   30 minutes  Procedures  Echocardiogram Renal US  Consults   Nephrology  DVT Prophylaxis  lovenox  Lab Results  Component Value Date   PLT 294 08/22/2015    Medications  Scheduled Meds: . amLODipine  5 mg Oral Daily  . enoxaparin (LOVENOX) injection  75 mg Subcutaneous Q24H  . furosemide  40 mg Intravenous BID  . sodium chloride  3 mL Intravenous Q12H   Continuous Infusions:  PRN Meds:.acetaminophen, bisacodyl, oxyCODONE-acetaminophen **AND** oxyCODONE, senna-docusate  Antibiotics    Anti-infectives    None     Subjective:   Gregory Diaz seen and examined today.  Patient still complains of swelling.  Denies chest pain, feels breathing has improved.  Patient denies chest pain, dizziness, headache, abdominal pain, N/V/D/C.  Objective:   Filed Vitals:   08/23/15 1402 08/23/15 1649 08/23/15 2022 08/24/15 0415  BP:  170/83 176/90 160/84  Pulse: 48 51 56 63  Temp:  97.7 F (36.5 C) 97.6 F (36.4 C) 97.8 F (36.6 C)  TempSrc:  Oral Oral Oral  Resp: 18 18 18 18   Height:      Weight:    148.961 kg (328 lb 6.4 oz)  SpO2: 91% 98% 98% 100%    Wt Readings from Last 3 Encounters:  08/24/15 148.961 kg (328 lb 6.4 oz)  08/22/15 152.409 kg (336 lb)  05/31/15 139.663 kg (307 lb 14.4 oz)     Intake/Output Summary (Last 24 hours) at 08/24/15 0734 Last data filed at 08/23/15 2300  Gross per 24 hour  Intake    924 ml  Output    890 ml  Net     34 ml     Exam  General: Well developed, well nourished, NAD  HEENT: NCAT, mucous membranes moist.   Cardiovascular: S1 S2 auscultated, +SEM, Regular rate and rhythm.  Respiratory: Clear to auscultation  Abdomen: Soft, obese, nontender, nondistended, + bowel sounds  Extremities: warm dry without cyanosis clubbing. LE tight, +pedal edema  Neuro: AAOx3, nonfocal  Psych: Normal affect and demeanor, pleasant  Data Review   Micro Results No results found for this or any previous visit (from the past 240 hour(s)).  Radiology Reports Dg Chest 2 View  08/22/2015   CLINICAL DATA:  Shortness of breath, 5 days duration.  EXAM: CHEST  2 VIEW  COMPARISON:  06/09/2014  FINDINGS: The heart is enlarged. The aorta is unfolded. There is interstitial and early alveolar pulmonary edema with a small amount of fluid in the fissures. No large effusion. No significant bony finding.  IMPRESSION: Congestive heart failure/early pulmonary edema.   Electronically Signed   By: Nelson Chimes M.D.   On: 08/22/2015 13:17    CBC  Recent Labs Lab 08/22/15 1224 08/22/15 1346 08/22/15 1728  WBC 7.8 7.6 7.7  HGB 11.2* 11.5* 11.0*  HCT 35.9* 35.6* 34.1*  PLT  --  291 294  MCV 82.9 86.2 87.0  MCH 25.8* 27.8 28.1  MCHC 31.1* 32.3 32.3  RDW  --  12.8 12.9    Chemistries   Recent Labs Lab 08/22/15 1201 08/22/15 1346 08/22/15 1728 08/23/15 0337  NA 144 138  --  142  K 4.1 3.8  --  3.7  CL 108 105  --  106  CO2 29 27  --  30  GLUCOSE 96 104*  --  100*  BUN 27* 25*  --  24*  CREATININE 1.28* 1.32* 1.37* 1.30*  CALCIUM 7.9* 7.8*  --  7.8*  AST 22 24  --   --   ALT 22 23  --   --   ALKPHOS 81 80  --   --   BILITOT 0.5 0.6  --   --    ------------------------------------------------------------------------------------------------------------------ estimated creatinine clearance is 86.1 mL/min (by C-G formula based on Cr of  1.3). ------------------------------------------------------------------------------------------------------------------  Recent Labs  08/22/15 1346  HGBA1C 6.6*   ------------------------------------------------------------------------------------------------------------------ No results for input(s): CHOL, HDL, LDLCALC, TRIG, CHOLHDL, LDLDIRECT in the last 72 hours. ------------------------------------------------------------------------------------------------------------------  Recent Labs  08/22/15 1201  TSH 1.917   ------------------------------------------------------------------------------------------------------------------  Recent Labs  08/23/15 0337  VITAMINB12 334  FERRITIN 116  TIBC 206*  IRON 36*  RETICCTPCT 0.8    Coagulation profile No results for input(s): INR, PROTIME in the last 168 hours.  No results for input(s): DDIMER in the last 72 hours.  Cardiac Enzymes No results for input(s): CKMB, TROPONINI, MYOGLOBIN in the last 168 hours.  Invalid input(s): CK ------------------------------------------------------------------------------------------------------------------ Invalid input(s): POCBNP    Gregory Diaz D.O. on 08/24/2015 at 7:34 AM  Between 7am to 7pm - Pager - [phone]  After 7pm go to [email or web address] - password [email or web address]  And look for the night coverage person covering for me after hours  [facility] Office  [phone]

## 2015-08-24 NOTE — Evaluation (Signed)
Physical Therapy Evaluation Patient Details Name: Gregory Diaz MRN: [id number] DOB: 10-May-1951 Today's Date: 08/24/2015   History of Present Illness  Gregory Diaz is a 64 y.o. male with a past medical history significant for HTN, obesity, and chronic low back pain with prior PLIF who presents with one week increased swelling in the legs scrotum and arms as well as shortness of breath on exertion. Pt with acute CHF  Clinical Impression  Pt very pleasant with decreased awareness of balance deficits stating his hips are just stiff. Pt with good bil LE strength 5/5 but states dorsiflexion is difficult due to edema and ROM. Pt normally independent but demonstrates difficulty with bed mobility with reliance on rail and mattress to be able to rise and balance with gait. Pt will benefit from acute therapy to maximize mobility, balance, gait and function to decrease fall risk.   Follow Up Recommendations Outpatient PT    Equipment Recommendations  None recommended by PT    Recommendations for Other Services       Precautions / Restrictions Precautions Precautions: Fall      Mobility  Bed Mobility Overal bed mobility: Modified Independent             General bed mobility comments: reliance on rail to rise from side to sit with cues  Transfers Overall transfer level: Modified independent                  Ambulation/Gait Ambulation/Gait assistance: Min guard Ambulation Distance (Feet): 250 Feet Assistive device: None Gait Pattern/deviations: Step-through pattern;Decreased stride length   Gait velocity interpretation: Below normal speed for age/gender General Gait Details: Pt with mostly steady gait but 3 periods of partial LOB with pt reaching out for handrail to steady self with cues for stability and balance. REcommend cane for future gait. Several standing rests for pt to talk during gait  Stairs Stairs: Yes Stairs assistance: Supervision Stair Management: One  rail Right;Step to pattern Number of Stairs: 2 General stair comments: pt with reliance on rail to pull up to 2nd step and pt reports no railing at home. Encouraged installation of rail which pt states he can have put in.  Wheelchair Mobility    Modified Rankin (Stroke Patients Only)       Balance Overall balance assessment: Needs assistance   Sitting balance-Leahy Scale: Good       Standing balance-Leahy Scale: Good                               Pertinent Vitals/Pain Pain Assessment: No/denies pain  HR 60-85 sats 94-98% on RA throughout    Home Living Family/patient expects to be discharged to:: Private residence Living Arrangements: Spouse/significant other Available Help at Discharge: Family;Available PRN/intermittently Type of Home: House Home Access: Stairs to enter Entrance Stairs-Rails: None Entrance Stairs-Number of Steps: 2 Home Layout: One level Home Equipment: Walker - 2 wheels;Cane - single point;Crutches;Shower seat;Hand held shower head      Prior Function Level of Independence: Independent         Comments: 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     Journalist, newspaper        Extremity/Trunk Assessment   Upper Extremity Assessment: Overall WFL for tasks assessed           Lower Extremity Assessment: Overall WFL for tasks assessed      Cervical / Trunk Assessment: Normal  Communication  Communication: No difficulties  Cognition Arousal/Alertness: Awake/alert Behavior During Therapy: WFL for tasks assessed/performed Overall Cognitive Status: Within Functional Limits for tasks assessed                      General Comments      Exercises        Assessment/Plan    PT Assessment Patient needs continued PT services  PT Diagnosis Difficulty walking   PT Problem List Decreased activity tolerance;Decreased balance;Decreased knowledge of use of DME  PT Treatment Interventions Gait training;DME  instruction;Stair training;Patient/family education;Functional mobility training;Therapeutic activities;Balance training   PT Goals (Current goals can be found in the Care Plan section) Acute Rehab PT Goals Patient Stated Goal: return home PT Goal Formulation: With patient Time For Goal Achievement: 09/07/15 Potential to Achieve Goals: Fair    Frequency Min 3X/week   Barriers to discharge Decreased caregiver support      Co-evaluation               End of Session   Activity Tolerance: Patient tolerated treatment well Patient left: in chair;with call bell/phone within Diaz;with family/visitor present Nurse Communication: Mobility status         Time: 9629-5284 PT Time Calculation (min) (ACUTE ONLY): 21 min   Charges:   PT Evaluation $Initial PT Evaluation Tier I: 1 Procedure     PT G CodesMelford Diaz 08/24/2015, 1:45 PM Gregory Diaz, [place]

## 2015-08-25 DIAGNOSIS — N049 Nephrotic syndrome with unspecified morphologic changes: Principal | ICD-10-CM

## 2015-08-25 DIAGNOSIS — I5031 Acute diastolic (congestive) heart failure: Secondary | ICD-10-CM

## 2015-08-25 DIAGNOSIS — N179 Acute kidney failure, unspecified: Secondary | ICD-10-CM | POA: Insufficient documentation

## 2015-08-25 DIAGNOSIS — M4806 Spinal stenosis, lumbar region: Secondary | ICD-10-CM

## 2015-08-25 LAB — GLUCOSE, CAPILLARY
Glucose-Capillary: 101 mg/dL — ABNORMAL HIGH (ref 65–99)
Glucose-Capillary: 99 mg/dL (ref 65–99)
Glucose-Capillary: 109 mg/dL — ABNORMAL HIGH (ref 65–99)
Glucose-Capillary: 120 mg/dL — ABNORMAL HIGH (ref 65–99)

## 2015-08-25 LAB — BASIC METABOLIC PANEL
Anion gap: 10 (ref 5–15)
BUN: 25 mg/dL — ABNORMAL HIGH (ref 6–20)
CO2: 31 mmol/L (ref 22–32)
Calcium: 8.1 mg/dL — ABNORMAL LOW (ref 8.9–10.3)
Chloride: 101 mmol/L (ref 101–111)
Creatinine, Ser: 1.46 mg/dL — ABNORMAL HIGH (ref 0.61–1.24)
GFR calc Af Amer: 56 mL/min — ABNORMAL LOW (ref >60)
GFR calc non Af Amer: 48 mL/min — ABNORMAL LOW (ref >60)
Glucose, Bld: 98 mg/dL (ref 65–99)
Potassium: 3.6 mmol/L (ref 3.5–5.1)
Sodium: 142 mmol/L (ref 135–145)

## 2015-08-25 LAB — CBC
HCT: 36.4 % — ABNORMAL LOW (ref 39.0–52.0)
Hemoglobin: 11.5 g/dL — ABNORMAL LOW (ref 13.0–17.0)
MCH: 27 pg (ref 26.0–34.0)
MCHC: 31.6 g/dL (ref 30.0–36.0)
MCV: 85.4 fL (ref 78.0–100.0)
Platelets: 340 10*3/uL (ref 150–400)
RBC: 4.26 MIL/uL (ref 4.22–5.81)
RDW: 12.6 % (ref 11.5–15.5)
WBC: 6.7 10*3/uL (ref 4.0–10.5)

## 2015-08-25 LAB — C3 COMPLEMENT: C3 Complement: 98 mg/dL (ref 82–167)

## 2015-08-25 LAB — KAPPA/LAMBDA LIGHT CHAINS
Kappa free light chain: 74.9 mg/L — ABNORMAL HIGH (ref 3.30–19.40)
Kappa, lambda light chain ratio: 0.71 (ref 0.26–1.65)
Lambda free light chains: 105.08 mg/L — ABNORMAL HIGH (ref 5.71–26.30)

## 2015-08-25 LAB — PROTEIN ELECTROPHORESIS, SERUM
A/G Ratio: 0.7 (ref 0.7–1.7)
Albumin ELP: 2.1 g/dL — ABNORMAL LOW (ref 2.9–4.4)
Alpha-1-Globulin: 0.2 g/dL (ref 0.0–0.4)
Alpha-2-Globulin: 0.9 g/dL (ref 0.4–1.0)
Beta Globulin: 0.7 g/dL (ref 0.7–1.3)
Gamma Globulin: 1 g/dL (ref 0.4–1.8)
Globulin, Total: 2.9 g/dL (ref 2.2–3.9)
M-Spike, %: 0.5 g/dL — ABNORMAL HIGH
Total Protein ELP: 5 g/dL — ABNORMAL LOW (ref 6.0–8.5)

## 2015-08-25 LAB — PROTIME-INR
INR: 1.23 (ref 0.00–1.49)
Prothrombin Time: 15.6 seconds — ABNORMAL HIGH (ref 11.6–15.2)

## 2015-08-25 LAB — ANTI-DNA ANTIBODY, DOUBLE-STRANDED: ds DNA Ab: <1 IU/mL (ref 0–9)

## 2015-08-25 LAB — APTT: aPTT: 32 seconds (ref 24–37)

## 2015-08-25 LAB — HEPATITIS PANEL, ACUTE
HCV Ab: <0.1 s/co ratio (ref 0.0–0.9)
Hep A IgM: NEGATIVE
Hep B C IgM: NEGATIVE
Hepatitis B Surface Ag: NEGATIVE

## 2015-08-25 LAB — HIV ANTIBODY (ROUTINE TESTING W REFLEX): HIV Screen 4th Generation wRfx: NONREACTIVE

## 2015-08-25 LAB — COMPLEMENT, TOTAL: Compl, Total (CH50): >60 U/mL (ref 42–60)

## 2015-08-25 LAB — PROTEIN, URINE, 24 HOUR
Collection Interval-UPROT: 24 hours
Protein, 24H Urine: 13176 mg/d — ABNORMAL HIGH (ref 50–100)
Protein, Urine: 366 mg/dL
Urine Total Volume-UPROT: 3600 mL

## 2015-08-25 LAB — C4 COMPLEMENT: Complement C4, Body Fluid: 21 mg/dL (ref 14–44)

## 2015-08-25 LAB — MPO/PR-3 (ANCA) ANTIBODIES
ANCA Proteinase 3: <3.5 U/mL (ref 0.0–3.5)
Myeloperoxidase Abs: <9 U/mL (ref 0.0–9.0)

## 2015-08-25 LAB — ANTISTREPTOLYSIN O TITER: ASO: <20 IU/mL (ref 0.0–200.0)

## 2015-08-25 LAB — OCCULT BLOOD X 1 CARD TO LAB, STOOL: Fecal Occult Bld: POSITIVE — AB

## 2015-08-25 LAB — ANTINUCLEAR ANTIBODIES, IFA: ANA Ab, IFA: NEGATIVE

## 2015-08-25 MED ORDER — FUROSEMIDE 10 MG/ML IJ SOLN
80.0000 mg | Freq: Two times a day (BID) | INTRAMUSCULAR | Status: DC
  Administered 2015-08-25 – 2015-08-26 (×2): 80 mg via INTRAVENOUS
  Filled 2015-08-25 (×3): qty 8

## 2015-08-25 MED ORDER — ENOXAPARIN SODIUM 80 MG/0.8ML ~~LOC~~ SOLN
75.0000 mg | SUBCUTANEOUS | Status: DC
  Filled 2015-08-25: qty 0.8

## 2015-08-25 NOTE — Progress Notes (Signed)
Nutrition Education Note  RD provided "Low Sodium Nutrition Therapy" handout from the [employer]. Reviewed patient's dietary recall. Provided examples on ways to decrease sodium intake in diet. Discouraged intake of processed foods and use of salt shaker. Encouraged fresh fruits and vegetables as well as whole grain sources of carbohydrates to maximize fiber intake.   RD discussed why it is important for patient to adhere to diet recommendations, and emphasized the role of fluids, foods to avoid, and importance of weighing self daily. Teach back method used. Pt states that he needs to stop eating out and eat more fresh foods but, unable to teach-back daily sodium limit or recommend amount of sodium per serving to look for on nutrition labels.   Expect good compliance, needs reinforcement.   Body mass index is 42.14 kg/(m^2). Pt meets criteria for Morbid Obesity based on current BMI.  Current diet order is Heart Healthy (NPO today), patient is consuming approximately 100% of meals at this time. Labs and medications reviewed. No further nutrition interventions warranted at this time. RD contact information provided. If additional nutrition issues arise, please re-consult RD.   Scarlette Ar RD, LDN Inpatient Clinical Dietitian Pager: [phone] After Hours Pager: [phone]

## 2015-08-25 NOTE — Progress Notes (Signed)
Patient ID: MERWYN HODAPP, male   DOB: January 18, 1951, 64 y.o.   MRN: [id number] S:feels much better O:BP 134/91 mmHg  Pulse 53  Temp(Src) 97.3 F (36.3 C) (Oral)  Resp 22  Ht 6\' 2"  (1.88 m)  Wt 148.952 kg (328 lb 6.1 oz)  BMI 42.14 kg/m2  SpO2 100%  Intake/Output Summary (Last 24 hours) at 08/25/15 0838 Last data filed at 08/25/15 0753  Gross per 24 hour  Intake    562 ml  Output   3900 ml  Net  -3338 ml   Intake/Output: I/O last 3 completed shifts: In: 562 [P.O.:562] Out: 3600 [Urine:3600]  Intake/Output this shift:  Total I/O In: -  Out: 750 [Urine:750] Weight change: -0.009 kg (-0.3 oz) Gen:WD WN AAM in NAD GUR:[id number], no rub Resp:cta BTD:VVOHYW VPX:TGGYI to 1+ pedal edema   Recent Labs Lab 08/22/15 1201 08/22/15 1346 08/22/15 1728 08/23/15 0337 08/24/15 0726 08/25/15 0410  NA 144 138  --  142 141 142  K 4.1 3.8  --  3.7 4.1 3.6  CL 108 105  --  106 104 101  CO2 29 27  --  30 28 31   GLUCOSE 96 104*  --  100* 99 98  BUN 27* 25*  --  24* 25* 25*  CREATININE 1.28* 1.32* 1.37* 1.30* 1.28* 1.46*  ALBUMIN 2.4* 2.1*  --   --   --   --   CALCIUM 7.9* 7.8*  --  7.8* 8.1* 8.1*  AST 22 24  --   --   --   --   ALT 22 23  --   --   --   --    Liver Function Tests:  Recent Labs Lab 08/22/15 1201 08/22/15 1346  AST 22 24  ALT 22 23  ALKPHOS 81 80  BILITOT 0.5 0.6  PROT 5.4* 5.4*  ALBUMIN 2.4* 2.1*    Recent Labs Lab 08/22/15 1346  LIPASE 39   No results for input(s): AMMONIA in the last 168 hours. CBC:  Recent Labs Lab 08/22/15 1224  08/22/15 1346 08/22/15 1728 08/23/15 0337 08/24/15 0726 08/25/15 0410  WBC 7.8  < > 7.6 7.7  --  6.4 6.7  HGB 11.2*  < > 11.5* 11.0*  --  11.9* 11.5*  HCT 35.9*  < > 35.6* 34.1* 33.4* 37.6* 36.4*  MCV 82.9  --  86.2 87.0  --  85.8 85.4  PLT  --   < > 291 294  --  339 340  < > = values in this interval not displayed. Cardiac Enzymes: No results for input(s): CKTOTAL, CKMB, CKMBINDEX, TROPONINI in the last  168 hours. CBG:  Recent Labs Lab 08/24/15 1153 08/24/15 1655 08/24/15 2039 08/25/15 0551  GLUCAP 123* 89 133* 101*    Iron Studies:  Recent Labs  08/23/15 0337  IRON 36*  TIBC 206*  FERRITIN 116   Studies/Results: US Renal  08/24/2015  CLINICAL DATA:  Acute kidney injury EXAM: RENAL / URINARY TRACT ULTRASOUND COMPLETE COMPARISON:  None. FINDINGS: Right Kidney: Length: 15.6 cm. Numerous cysts in the upper and midpole all measuring less than or equal to 5 cm. No hydronephrosis. Normal echogenicity. Left Kidney: Length: 15.1 cm. Two lower pole cysts 1 measuring 4.4 cm in the other measuring 3.6 cm. No hydronephrosis. Normal echogenicity. Bladder: Appears normal for degree of bladder distention. IMPRESSION: Bilateral simple cysts.  No acute abnormalities. Electronically Signed   By: Skipper Cliche M.D.   On: 08/24/2015 08:28   . amLODipine  5 mg Oral Daily  . enoxaparin (LOVENOX) injection  75 mg Subcutaneous Q24H  . furosemide  80 mg Intravenous 3 times per day  . insulin aspart  0-9 Units Subcutaneous TID WC  . sodium chloride  3 mL Intravenous Q12H    BMET    Component Value Date/Time   NA 142 08/25/2015 0410   K 3.6 08/25/2015 0410   CL 101 08/25/2015 0410   CO2 31 08/25/2015 0410   GLUCOSE 98 08/25/2015 0410   BUN 25* 08/25/2015 0410   CREATININE 1.46* 08/25/2015 0410   CREATININE 1.28* 08/22/2015 1201   CALCIUM 8.1* 08/25/2015 0410   GFRNONAA 48* 08/25/2015 0410   GFRAA 56* 08/25/2015 0410   CBC    Component Value Date/Time   WBC 6.7 08/25/2015 0410   WBC 7.8 08/22/2015 1224   RBC 4.26 08/25/2015 0410   RBC 4.06* 08/23/2015 0337   RBC 4.33* 08/22/2015 1224   HGB 11.5* 08/25/2015 0410   HGB 11.2* 08/22/2015 1224   HCT 36.4* 08/25/2015 0410   HCT 33.4* 08/23/2015 0337   HCT 35.9* 08/22/2015 1224   PLT 340 08/25/2015 0410   MCV 85.4 08/25/2015 0410   MCV 82.9 08/22/2015 1224   MCH 27.0 08/25/2015 0410   MCH 25.8* 08/22/2015 1224   MCHC 31.6 08/25/2015  0410   MCHC 31.1* 08/22/2015 1224   RDW 12.6 08/25/2015 0410   LYMPHSABS 2.2 03/11/2015 1425   MONOABS 0.5 03/11/2015 1425   EOSABS 0.1 03/11/2015 1425   BASOSABS 0.0 03/11/2015 1425     Assessment/Plan: 1. Anasarca/Nephrotic syndrome- normal EF, new onset nephrotic range proteinuria associated with drop in albumin concerning for nephrotic syndrome. DDx includes minimal change disease, FSGS, and Membranous.  1. initiated workup with serologies and 24 hour urine for protein, will also check SPEP/UPEP  2. renal biopsy 08/25/15 appreciate IR assistance.  3. For now will continue with IV diuresis, continue to hold NSAIDs (which have been associated with nephrotic syndrome/membranous).  4. Limit sodium and fluid intake. Monitor daily weights and strict I's/O's.  2. AKI- likely related to ischemic ATN in setting of nephrotic syndrome and aggressive diuresis, await renal biopsy. 1. Continue to hold lisinopril for now 2. Decrease IV lasix dose given marked diuresis. 3. HTN- BP elevated, will increase diuresis and titrate meds.  4. Obesity 5. Deconditioning 6. DDD s/p laminectomy- stop NSAIDs 7. Anemia- iron deficiency, replete and follow  Comer Devins A

## 2015-08-25 NOTE — Progress Notes (Signed)
PROGRESS NOTE  Gregory Diaz XHB:[id number] DOB: [birth year] DOA: 08/22/2015 PCP: Purvis Kilts, MD  Brief History 64 y.o. male with a past medical history significant for HTN, obesity, and chronic low back pain who presents withone week increased swelling in the legs scrotum and arms as well as shortness of breath on exertion. The patient describes symptoms of developed subacutely over the last 1-2 weeks. These have included progressive swelling in his legs, scrotum, hands, arms, belly, and face. He notes that he's gained 30 pounds in the last few weeks. He also notes that he has shortness of breath any exertion. He has pain in the legs and groin and low back, related to swelling. In the ED, he was bradycardic and hypertensive and edematous. He had an increase in serum creatinine, mild normocytic anemia, low albumin, elevated BNP. His chest x-ray was consistent with pulmonary edema and [facility] were asked to admit for acute CHF.   Assessment/Plan:  Pulmonary Edema/Nephrotic syndrome/Acute Diastolic CHF -CXR: CHF, early pulm edema -Patient has no prior history of CHF -BNP 410 -Echocardiogram: EF 81-01%, diastolic function normal, PA pressure 75mmHg -monitor daily weights, intake/output. -pt has nephrotic range proteinuria  -urine protein/creatinine ratio 5.97 -NEG 6.8 L since admission -NEG 2 pounds since admission  Acute kidney injury with proteinuria and hematuria/Nephrotic syndrome -secondary to nephrotic syndrome in the setting of NSAID use -Baseline creatinine approximately 0.9, 1.37 upon admission -Cr 1.46 today -Nephrology appreciated -C3, C4, ANA, CH50, ANCA, ANA, ds DNA, ASO--all unrevealing -HIV, viral hepatitis serology--neg -08/24/2015 Renal ultrasound negative for hydronephrosis -renal biopsy postponed until 08/26/15  Normocytic anemia -Anemia panel: Iron 36, ferritin 116, saturation ratio 17, vitamin B12 334 -LDH 148, Haptoglobin 397 -Hb baseline 14,  currently 11--remains stable during the admission -FOBT--positive -Hgb stable -08/31/2009 colonoscopy normal (Dr. Aviva Signs) -outpt GI Followup  Hypoalbuminemia -Appreciate nutrition  New diabetes mellitus, type 2 -08/22/15--HbA1c 6.6 -Continue ISS and CBG monitoring -plan to d/c home on oral agent  Hypertension -Lisinopril held -Continue lasix, amlodipine   Low back pain/Spinal Stenosis--Lumbar -Meloxicam held due to acute renal injury -Continue Percocet PRN -s/p fusion laminectomy of L3-4 on 05/20/15   Code Status: Full  Family Communication: wife updated at bedside  Disposition Plan: Admitted. Pending further workup  Time Spent in minutes 30 minutes         Procedures/Studies: Dg Chest 2 View  08/22/2015  CLINICAL DATA:  Shortness of breath, 5 days duration. EXAM: CHEST  2 VIEW COMPARISON:  06/09/2014 FINDINGS: The heart is enlarged. The aorta is unfolded. There is interstitial and early alveolar pulmonary edema with a small amount of fluid in the fissures. No large effusion. No significant bony finding. IMPRESSION: Congestive heart failure/early pulmonary edema. Electronically Signed   By: Nelson Chimes M.D.   On: 08/22/2015 13:17   US Renal  08/24/2015  CLINICAL DATA:  Acute kidney injury EXAM: RENAL / URINARY TRACT ULTRASOUND COMPLETE COMPARISON:  None. FINDINGS: Right Kidney: Length: 15.6 cm. Numerous cysts in the upper and midpole all measuring less than or equal to 5 cm. No hydronephrosis. Normal echogenicity. Left Kidney: Length: 15.1 cm. Two lower pole cysts 1 measuring 4.4 cm in the other measuring 3.6 cm. No hydronephrosis. Normal echogenicity. Bladder: Appears normal for degree of bladder distention. IMPRESSION: Bilateral simple cysts.  No acute abnormalities. Electronically Signed   By: Skipper Cliche M.D.   On: 08/24/2015 08:28         Subjective: Patient  denies fevers, chills, headache, chest pain, dyspnea, nausea, vomiting, diarrhea, abdominal  pain, dysuria, hematuria. Had an episode of hematochezia earlier in the admission but none since. Denies any headaches, dizziness.   Objective: Filed Vitals:   08/24/15 1339 08/24/15 2040 08/25/15 0446 08/25/15 1156  BP:  136/94 134/91 167/64  Pulse: 60 59 53 51  Temp:  97.6 F (36.4 C) 97.3 F (36.3 C) 97.3 F (36.3 C)  TempSrc:  Oral Oral Oral  Resp:  19 22 22   Height:      Weight:   148.952 kg (328 lb 6.1 oz)   SpO2: 94% 98% 100% 98%    Intake/Output Summary (Last 24 hours) at 08/25/15 1626 Last data filed at 08/25/15 1230  Gross per 24 hour  Intake    222 ml  Output   3625 ml  Net  -3403 ml   Weight change: -0.009 kg (-0.3 oz) Exam:   General:  Pt is alert, follows commands appropriately, not in acute distress  HEENT: No icterus, No thrush, No neck mass, [place]/AT  Cardiovascular: RRR, S1/S2, no rubs, no gallops  Respiratory: CTA bilaterally, no wheezing, no crackles, no rhonchi  Abdomen: Soft/+BS, non tender, non distended, no guarding  Extremities: trace  edema, No lymphangitis, No petechiae, No rashes, no synovitis  Data Reviewed: Basic Metabolic Panel:  Recent Labs Lab 08/22/15 1201 08/22/15 1346 08/22/15 1728 08/23/15 0337 08/24/15 0726 08/25/15 0410  NA 144 138  --  142 141 142  K 4.1 3.8  --  3.7 4.1 3.6  CL 108 105  --  106 104 101  CO2 29 27  --  30 28 31   GLUCOSE 96 104*  --  100* 99 98  BUN 27* 25*  --  24* 25* 25*  CREATININE 1.28* 1.32* 1.37* 1.30* 1.28* 1.46*  CALCIUM 7.9* 7.8*  --  7.8* 8.1* 8.1*   Liver Function Tests:  Recent Labs Lab 08/22/15 1201 08/22/15 1346  AST 22 24  ALT 22 23  ALKPHOS 81 80  BILITOT 0.5 0.6  PROT 5.4* 5.4*  ALBUMIN 2.4* 2.1*    Recent Labs Lab 08/22/15 1346  LIPASE 39   No results for input(s): AMMONIA in the last 168 hours. CBC:  Recent Labs Lab 08/22/15 1224 08/22/15 1346 08/22/15 1728 08/23/15 0337 08/24/15 0726 08/25/15 0410  WBC 7.8 7.6 7.7  --  6.4 6.7  HGB 11.2* 11.5* 11.0*   --  11.9* 11.5*  HCT 35.9* 35.6* 34.1* 33.4* 37.6* 36.4*  MCV 82.9 86.2 87.0  --  85.8 85.4  PLT  --  291 294  --  339 340   Cardiac Enzymes: No results for input(s): CKTOTAL, CKMB, CKMBINDEX, TROPONINI in the last 168 hours. BNP: Invalid input(s): POCBNP CBG:  Recent Labs Lab 08/24/15 1153 08/24/15 1655 08/24/15 2039 08/25/15 0551 08/25/15 1207  GLUCAP 123* 89 133* 101* 99    No results found for this or any previous visit (from the past 240 hour(s)).   Scheduled Meds: . amLODipine  5 mg Oral Daily  . enoxaparin (LOVENOX) injection  75 mg Subcutaneous Q24H  . furosemide  80 mg Intravenous BID  . insulin aspart  0-9 Units Subcutaneous TID WC  . sodium chloride  3 mL Intravenous Q12H   Continuous Infusions:    Tyheem Boughner, DO  [facility] Pager [phone]  If 7PM-7AM, please contact night-coverage [email or web address] Password [email or web address] 08/25/2015, 4:26 PM   LOS: 3 days

## 2015-08-25 NOTE — Care Management Important Message (Signed)
Important Message  Patient Details  Name: Gregory Diaz MRN: [id number] Date of Birth: [birth year]   Medicare Important Message Given:  Yes-second notification given    Delorse Lek 08/25/2015, 10:35 AM

## 2015-08-26 ENCOUNTER — Inpatient Hospital Stay (HOSPITAL_COMMUNITY): Payer: Medicare HMO

## 2015-08-26 DIAGNOSIS — I10 Essential (primary) hypertension: Secondary | ICD-10-CM

## 2015-08-26 LAB — GLUCOSE, CAPILLARY
Glucose-Capillary: 94 mg/dL (ref 65–99)
Glucose-Capillary: 105 mg/dL — ABNORMAL HIGH (ref 65–99)
Glucose-Capillary: 110 mg/dL — ABNORMAL HIGH (ref 65–99)
Glucose-Capillary: 122 mg/dL — ABNORMAL HIGH (ref 65–99)

## 2015-08-26 LAB — CBC
HCT: 36.7 % — ABNORMAL LOW (ref 39.0–52.0)
HCT: 35 % — ABNORMAL LOW (ref 39.0–52.0)
Hemoglobin: 11.6 g/dL — ABNORMAL LOW (ref 13.0–17.0)
Hemoglobin: 11.1 g/dL — ABNORMAL LOW (ref 13.0–17.0)
MCH: 27.1 pg (ref 26.0–34.0)
MCH: 27.2 pg (ref 26.0–34.0)
MCHC: 31.6 g/dL (ref 30.0–36.0)
MCHC: 31.7 g/dL (ref 30.0–36.0)
MCV: 85.7 fL (ref 78.0–100.0)
MCV: 85.8 fL (ref 78.0–100.0)
Platelets: 321 10*3/uL (ref 150–400)
Platelets: 323 10*3/uL (ref 150–400)
RBC: 4.28 MIL/uL (ref 4.22–5.81)
RBC: 4.08 MIL/uL — ABNORMAL LOW (ref 4.22–5.81)
RDW: 12.7 % (ref 11.5–15.5)
RDW: 12.7 % (ref 11.5–15.5)
WBC: 6.6 10*3/uL (ref 4.0–10.5)
WBC: 8.1 10*3/uL (ref 4.0–10.5)

## 2015-08-26 LAB — RENAL FUNCTION PANEL
Albumin: 1.9 g/dL — ABNORMAL LOW (ref 3.5–5.0)
Anion gap: 7 (ref 5–15)
BUN: 23 mg/dL — ABNORMAL HIGH (ref 6–20)
CO2: 30 mmol/L (ref 22–32)
Calcium: 7.7 mg/dL — ABNORMAL LOW (ref 8.9–10.3)
Chloride: 101 mmol/L (ref 101–111)
Creatinine, Ser: 1.3 mg/dL — ABNORMAL HIGH (ref 0.61–1.24)
GFR calc Af Amer: >60 mL/min (ref >60)
GFR calc non Af Amer: 56 mL/min — ABNORMAL LOW (ref >60)
Glucose, Bld: 103 mg/dL — ABNORMAL HIGH (ref 65–99)
Phosphorus: 3.9 mg/dL (ref 2.5–4.6)
Potassium: 3.6 mmol/L (ref 3.5–5.1)
Sodium: 138 mmol/L (ref 135–145)

## 2015-08-26 MED ORDER — FENTANYL CITRATE (PF) 100 MCG/2ML IJ SOLN
INTRAMUSCULAR | Status: AC
  Filled 2015-08-26: qty 4

## 2015-08-26 MED ORDER — FUROSEMIDE 80 MG PO TABS
80.0000 mg | ORAL_TABLET | Freq: Two times a day (BID) | ORAL | Status: DC
  Administered 2015-08-26 – 2015-08-27 (×2): 80 mg via ORAL
  Filled 2015-08-26 (×2): qty 1

## 2015-08-26 MED ORDER — CARBAMAZEPINE 200 MG PO TABS
200.0000 mg | ORAL_TABLET | Freq: Every day | ORAL | Status: DC
  Administered 2015-08-27: 200 mg via ORAL
  Filled 2015-08-26 (×2): qty 1

## 2015-08-26 MED ORDER — FENTANYL CITRATE (PF) 100 MCG/2ML IJ SOLN
INTRAMUSCULAR | Status: AC | PRN
  Administered 2015-08-26: 50 ug via INTRAVENOUS

## 2015-08-26 MED ORDER — LIDOCAINE HCL (PF) 1 % IJ SOLN
INTRAMUSCULAR | Status: AC
  Filled 2015-08-26: qty 10

## 2015-08-26 MED ORDER — SODIUM CHLORIDE 0.9 % IV SOLN
INTRAVENOUS | Status: AC | PRN
  Administered 2015-08-26: 10 mL/h via INTRAVENOUS

## 2015-08-26 MED ORDER — MIDAZOLAM HCL 2 MG/2ML IJ SOLN
INTRAMUSCULAR | Status: AC
  Filled 2015-08-26: qty 4

## 2015-08-26 MED ORDER — MIDAZOLAM HCL 2 MG/2ML IJ SOLN
INTRAMUSCULAR | Status: AC | PRN
  Administered 2015-08-26: 1 mg via INTRAVENOUS

## 2015-08-26 NOTE — Sedation Documentation (Signed)
Patient is resting comfortably. 

## 2015-08-26 NOTE — Progress Notes (Signed)
Occupational Therapy Treatment Patient Details Name: Gregory Diaz MRN: [id number] DOB: 12/17/1950 Today's Date: 08/26/2015    History of present illness Gregory Diaz is a 64 y.o. male with a past medical history significant for HTN, obesity, and chronic low back pain with prior PLIF who presents with one week increased swelling in the legs scrotum and arms as well as shortness of breath on exertion. Pt with acute CHF   OT comments  Pt currently modified independent to supervision for toileting and bathing tasks sit to stand.  He has been educated on energy conservation strategies as he still exhibits limited endurance.  Will continue to follow in acute care for one more visit unless d/c home.  No OT follow-up post acute recommended.   Follow Up Recommendations  No OT follow up;Supervision - Intermittent    Equipment Recommendations  3 in 1 bedside comode    Recommendations for Other Services      Precautions / Restrictions Precautions Precautions: None Restrictions Weight Bearing Restrictions: No       Mobility Bed Mobility Overal bed mobility: Modified Independent                Transfers Overall transfer level: Modified independent   Transfers: Sit to/from Stand;Stand Pivot Transfers Sit to Stand: Modified independent (Device/Increase time) Stand pivot transfers: Modified independent (Device/Increase time)            Balance Overall balance assessment: Modified Independent (during grooming and toileting tasks)   Sitting balance-Leahy Scale: Normal       Standing balance-Leahy Scale: Good                     ADL Overall ADL's : Needs assistance/impaired     Grooming: Wash/dry hands;Wash/dry face;Sitting;Set up   Upper Body Bathing: Set up;Sitting     Lower Body Bathing Details (indicate cue type and reason): Pt declined this session         Toilet Transfer: Modified Independent;Regular Toilet Toilet Transfer Details (indicate cue  type and reason): Pt stood to use the urinal in the bathroom. Toileting- Clothing Manipulation and Hygiene: Modified independent;Sit to/from stand       Functional mobility during ADLs: Modified independent General ADL Comments: Pt very anxious about biopsy scheduled later today. Setup for bathing and grooming tasks in sitting this session.  Pt fatigues quickly with standing requiring sitting rest breaks.  Educated pt on energy conservation strategies and provided handout for reference.  discussed need for shower seat and 3:1 which pt agrees with.  He plans to use the 3:1 in the shower at home.       Vision                     Perception     Praxis      Cognition   Behavior During Therapy: WFL for tasks assessed/performed Overall Cognitive Status: Within Functional Limits for tasks assessed                       Extremity/Trunk Assessment               Exercises     Shoulder Instructions       General Comments      Pertinent Vitals/ Pain       Pain Assessment: Faces Faces Pain Scale: Hurts little more Pain Location: generalized in his hips and wrists Pain Descriptors / Indicators: Aching Pain Intervention(s): Limited activity within patient's  tolerance;Monitored during session  Home Living                                              Frequency Min 2X/week     Progress Toward Goals  OT Goals(current goals can now be found in the care plan section)  Progress towards OT goals: Progressing toward goals     Plan Discharge plan remains appropriate    Co-evaluation                 End of Session     Activity Tolerance Patient tolerated treatment well   Patient Left in chair;with call bell/phone within reach   Nurse Communication Mobility status        Time: 1833-5825 OT Time Calculation (min): 30 min  Charges: OT General Charges $OT Visit: 1 Procedure OT Treatments $Self Care/Home Management : 23-37  mins  Opaline Reyburn OTR/L 08/26/2015, 8:58 AM

## 2015-08-26 NOTE — Progress Notes (Signed)
Physical Therapy Treatment Patient Details Name: Gregory Diaz MRN: [id number] DOB: 1951-04-17 Today's Date: 08/26/2015    History of Present Illness Gregory Diaz is a 64 y.o. male with a past medical history significant for HTN, obesity, and chronic low back pain with prior PLIF who presents with one week increased swelling in the legs scrotum and arms as well as shortness of breath on exertion. Pt with acute CHF    PT Comments    Pt did well with cane. Able to ascend with cane and no rail with step to pattern.  He states they are working on getting a rail.  Does fatigue quickly. Pt had to return to room due to Lasix and pending transport for biopsy.  Follow Up Recommendations  Outpatient PT     Equipment Recommendations  None recommended by PT    Recommendations for Other Services       Precautions / Restrictions Precautions Precautions: None Restrictions Weight Bearing Restrictions: No    Mobility  Bed Mobility Overal bed mobility: Modified Independent                Transfers Overall transfer level: Modified independent Equipment used: None Transfers: Sit to/from Stand Sit to Stand: Modified independent (Device/Increase time) Stand pivot transfers: Modified independent (Device/Increase time)       General transfer comment:  (pt up in chair upon arrival)  Ambulation/Gait Ambulation/Gait assistance: Min guard Ambulation Distance (Feet): 145 Feet (plus 70') Assistive device: Straight cane Gait Pattern/deviations: Step-through pattern;Drifts right/left Gait velocity: WFL   General Gait Details: Pt required 1 sitting rest break during gait. Pt did well with cane and felt more balanced.  Tends to sway side to side.   Stairs   Stairs assistance: Min guard Stair Management: With cane;Forwards;Step to pattern Number of Stairs: 2 General stair comments: Able to ascend without rail and descent with some use of rail, simulating son's hand with cane in  the other hand.  Wheelchair Mobility    Modified Rankin (Stroke Patients Only)       Balance Overall balance assessment: Modified Independent (during grooming and toileting tasks)   Sitting balance-Leahy Scale: Normal       Standing balance-Leahy Scale: Fair                      Cognition Arousal/Alertness: Awake/alert Behavior During Therapy: WFL for tasks assessed/performed Overall Cognitive Status: Within Functional Limits for tasks assessed                      Exercises General Exercises - Lower Extremity Ankle Circles/Pumps: 10 reps;Both Hip Flexion/Marching: Seated;Both;10 reps    General Comments        Pertinent Vitals/Pain Pain Assessment: Faces Faces Pain Scale: Hurts little more Pain Location: hips, low back Pain Descriptors / Indicators: Aching Pain Intervention(s): Limited activity within patient's tolerance;Monitored during session;Repositioned    Home Living                      Prior Function            PT Goals (current goals can now be found in the care plan section) Acute Rehab PT Goals PT Goal Formulation: With patient Time For Goal Achievement: 09/07/15 Potential to Achieve Goals: Good Progress towards PT goals: Progressing toward goals    Frequency  Min 3X/week    PT Plan Current plan remains appropriate    Co-evaluation  End of Session Equipment Utilized During Treatment: Gait belt Activity Tolerance: Patient tolerated treatment well Patient left: in chair;with call bell/phone within reach     Time: 0855-0915 PT Time Calculation (min) (ACUTE ONLY): 20 min  Charges:  $Gait Training: 8-22 mins                    G Codes:      Gregory Diaz 08/26/2015, 9:48 AM

## 2015-08-26 NOTE — Procedures (Signed)
Interventional Radiology Procedure Note  Procedure:  Ultrasound guided core biopsy of right kidney  Complications:  None  Estimated Blood Loss: < 10 mL  16 G core biopsy x 2 of right lower pole renal cortex.  Intact core specimens obtained.  Venetia Night. Kathlene Cote, M.D Pager:  [phone]

## 2015-08-26 NOTE — Care Management Note (Signed)
Case Management Note  Patient Details  Name: Gregory Diaz MRN: [id number] Date of Birth: 14-Jun-1951  Subjective/Objective:      Admitted with Heart Failure              Action/Plan: Patient lives at home with spouse, independent of ADL's, continue to drive his car. Has private insurance with [employer] with prescription drug coverage - no problem getting his medication; Pharmacy of choice is [facility]. He has scales at home and weighs himself often. Physical Therapy recommends Outpatient PT - pt refused, stated " I'm not leaving my house to go somewhere and exercise, I can do that at my house." CM instructed patient to get the clearance for exercise from his physician prior to any exercise activity and to start out slow.   Expected Discharge Date:    possibly 08/28/2015              Expected Discharge Plan:  Home/Self Care    Discharge planning Services  CM Consult  Choice offered to:  Patient  Status of Service:  In process, will continue to follow  Medicare Important Message Given:  [facility] notification given  Sherrilyn Rist [phone] 08/26/2015, 3:13 PM

## 2015-08-26 NOTE — Progress Notes (Signed)
PROGRESS NOTE  Gregory Diaz YFV:[id number] DOB: 06-23-1951 DOA: 08/22/2015 PCP: Purvis Kilts, MD Brief History 64 y.o. male with a past medical history significant for HTN, obesity, and chronic low back pain who presents withone week increased swelling in the legs scrotum and arms as well as shortness of breath on exertion. The patient describes symptoms of developed subacutely over the last 1-2 weeks. These have included progressive swelling in his legs, scrotum, hands, arms, belly, and face. He notes that he's gained 30 pounds in the last few weeks. He also notes that he has shortness of breath any exertion. He has pain in the legs and groin and low back, related to swelling. In the ED, he was bradycardic and hypertensive and edematous. He had an increase in serum creatinine, mild normocytic anemia, low albumin, elevated BNP. His chest x-ray was consistent with pulmonary edema and [facility] were asked to admit for acute CHF.   Assessment/Plan:  Pulmonary Edema/Nephrotic syndrome/Acute Diastolic CHF -CXR: CHF, early pulm edema -Patient has no prior history of CHF -BNP 410 -Echocardiogram: EF 16-38%, diastolic function normal, PA pressure 25mmHg -monitor daily weights, intake/output. -pt has nephrotic range proteinuria  -urine protein/creatinine ratio 5.97 -NEG 8.6 L since admission -NEG 9 pounds since admission  Acute kidney injury with proteinuria and hematuria/Nephrotic syndrome -secondary to nephrotic syndrome in the setting of NSAID use and hemodynamic changes -Baseline creatinine approximately 0.9, 1.37 upon admission -Cr 1.30 today -Nephrology appreciated -C3, C4, ANA, CH50, ANCA, ANA, ds DNA, ASO--all unrevealing -HIV, viral hepatitis serology--neg -08/24/2015 Renal ultrasound negative for hydronephrosis -renal biopsy postponed until 08/26/15 -awaiting SPEP--minimal M-spike with elevated free kappa and lambda light chains -08/26/15--discussed with  heme/onc--Dr. Sherrill--results not consistent with myeloma--await urine immunofixation, order serum immunoglobulins -24 hour urine immunifixation--pending  Normocytic anemia -Anemia panel: Iron 36, ferritin 116, saturation ratio 17, vitamin B12 334 -LDH 148, Haptoglobin 397 -Hb baseline 14, currently 11--remains stable during the admission -FOBT--positive -Hgb stable -08/31/2009 colonoscopy normal (Dr. Aviva Signs) -outpt GI Followup  Hypoalbuminemia -Appreciate nutrition  New diabetes mellitus, type 2 -08/22/15--HbA1c 6.6 -Continue ISS and CBG monitoring -plan to d/c home on oral agent  Hypertension -Lisinopril held -Continue lasix, amlodipine   Low back pain/Spinal Stenosis--Lumbar -Meloxicam held due to acute renal injury -Continue Percocet PRN -s/p fusion laminectomy of L3-4 on 05/20/15   Code Status: Full  Family Communication: wife updated at bedside  Disposition Plan: Admitted. Pending further workup  Time Spent in minutes 35 minutes  Procedures/Studies: Dg Chest 2 View  08/22/2015  CLINICAL DATA:  Shortness of breath, 5 days duration. EXAM: CHEST  2 VIEW COMPARISON:  06/09/2014 FINDINGS: The heart is enlarged. The aorta is unfolded. There is interstitial and early alveolar pulmonary edema with a small amount of fluid in the fissures. No large effusion. No significant bony finding. IMPRESSION: Congestive heart failure/early pulmonary edema. Electronically Signed   By: Nelson Chimes M.D.   On: 08/22/2015 13:17   US Renal  08/24/2015  CLINICAL DATA:  Acute kidney injury EXAM: RENAL / URINARY TRACT ULTRASOUND COMPLETE COMPARISON:  None. FINDINGS: Right Kidney: Length: 15.6 cm. Numerous cysts in the upper and midpole all measuring less than or equal to 5 cm. No hydronephrosis. Normal echogenicity. Left Kidney: Length: 15.1 cm. Two lower pole cysts 1 measuring 4.4 cm in the other measuring 3.6 cm. No hydronephrosis. Normal echogenicity. Bladder: Appears normal for  degree of bladder distention. IMPRESSION: Bilateral simple cysts.  No acute abnormalities.  Electronically Signed   By: Skipper Cliche M.D.   On: 08/24/2015 08:28         Subjective: Patient still complaining of arthralgias but are improving with improving edema. Denies any headache, fevers, chills, chest discomfort, shortness breath, nausea, vomiting, diarrhea, abdominal pain. No hematochezia. No melena. Denies any dysuria or hematuria. No hemoptysis. No dizziness or syncope.  Objective: Filed Vitals:   08/26/15 1220 08/26/15 1250 08/26/15 1320 08/26/15 1346  BP: 136/78 158/65 140/80 156/77  Pulse:      Temp:      TempSrc:      Resp:      Height:      Weight:      SpO2:        Intake/Output Summary (Last 24 hours) at 08/26/15 1605 Last data filed at 08/26/15 1340  Gross per 24 hour  Intake    480 ml  Output   2575 ml  Net  -2095 ml   Weight change: -4.1 kg (-9 lb 0.6 oz) Exam:   General:  Pt is alert, follows commands appropriately, not in acute distress  HEENT: No icterus, No thrush, No neck mass, [place]/AT  Cardiovascular: RRR, S1/S2, no rubs, no gallops  Respiratory: Fine bibasilar crackles. No wheezing. Good air movement.  Abdomen: Soft/+BS, non tender, non distended, no guarding; no hepatosplenomegaly. No rashes  Extremities: 1 +LE edema, No lymphangitis, No petechiae, No rashes, no synovitis; no clubbing or cyanosis  Data Reviewed: Basic Metabolic Panel:  Recent Labs Lab 08/22/15 1346 08/22/15 1728 08/23/15 0337 08/24/15 0726 08/25/15 0410 08/26/15 0458  NA 138  --  142 141 142 138  K 3.8  --  3.7 4.1 3.6 3.6  CL 105  --  106 104 101 101  CO2 27  --  30 28 31 30   GLUCOSE 104*  --  100* 99 98 103*  BUN 25*  --  24* 25* 25* 23*  CREATININE 1.32* 1.37* 1.30* 1.28* 1.46* 1.30*  CALCIUM 7.8*  --  7.8* 8.1* 8.1* 7.7*  PHOS  --   --   --   --   --  3.9   Liver Function Tests:  Recent Labs Lab 08/22/15 1201 08/22/15 1346 08/26/15 0458  AST 22 24   --   ALT 22 23  --   ALKPHOS 81 80  --   BILITOT 0.5 0.6  --   PROT 5.4* 5.4*  --   ALBUMIN 2.4* 2.1* 1.9*    Recent Labs Lab 08/22/15 1346  LIPASE 39   No results for input(s): AMMONIA in the last 168 hours. CBC:  Recent Labs Lab 08/22/15 1346 08/22/15 1728 08/23/15 0337 08/24/15 0726 08/25/15 0410 08/26/15 0458  WBC 7.6 7.7  --  6.4 6.7 6.6  HGB 11.5* 11.0*  --  11.9* 11.5* 11.6*  HCT 35.6* 34.1* 33.4* 37.6* 36.4* 36.7*  MCV 86.2 87.0  --  85.8 85.4 85.7  PLT 291 294  --  339 340 321   Cardiac Enzymes: No results for input(s): CKTOTAL, CKMB, CKMBINDEX, TROPONINI in the last 168 hours. BNP: Invalid input(s): POCBNP CBG:  Recent Labs Lab 08/25/15 1207 08/25/15 1558 08/25/15 2127 08/26/15 0646 08/26/15 1142  GLUCAP 99 109* 120* 94 105*    No results found for this or any previous visit (from the past 240 hour(s)).   Scheduled Meds: . amLODipine  5 mg Oral Daily  . enoxaparin (LOVENOX) injection  75 mg Subcutaneous Q24H  . fentaNYL      .  furosemide  80 mg Oral BID  . insulin aspart  0-9 Units Subcutaneous TID WC  . lidocaine (PF)      . midazolam      . sodium chloride  3 mL Intravenous Q12H   Continuous Infusions:    Sunny Aguon, DO  [facility] Pager [phone]  If 7PM-7AM, please contact night-coverage [email or web address] Password [email or web address] 08/26/2015, 4:05 PM   LOS: 4 days

## 2015-08-26 NOTE — Sedation Documentation (Signed)
Patient denies pain and is resting comfortably.  

## 2015-08-26 NOTE — Progress Notes (Signed)
Patient ID: Gregory Diaz, male   DOB: 03-27-1951, 64 y.o.   MRN: [id number] S:no new complaints O:BP 161/90 mmHg  Pulse 66  Temp(Src) 98.4 F (36.9 C) (Oral)  Resp 21  Ht 6\' 2"  (1.88 m)  Wt 144.852 kg (319 lb 5.4 oz)  BMI 40.98 kg/m2  SpO2 97%  Intake/Output Summary (Last 24 hours) at 08/26/15 0753 Last data filed at 08/26/15 0336  Gross per 24 hour  Intake    720 ml  Output   2250 ml  Net  -1530 ml   Intake/Output: I/O last 3 completed shifts: In: 720 [P.O.:720] Out: 4450 [Urine:4450]  Intake/Output this shift:    Weight change: -4.1 kg (-9 lb 0.6 oz) Gen:WD WN obese AAM in NAD CVS:no rub Resp:cta MBW:GYKZLD Ext:1+ edema   Recent Labs Lab 08/22/15 1201 08/22/15 1346 08/22/15 1728 08/23/15 0337 08/24/15 0726 08/25/15 0410 08/26/15 0458  NA 144 138  --  142 141 142 138  K 4.1 3.8  --  3.7 4.1 3.6 3.6  CL 108 105  --  106 104 101 101  CO2 29 27  --  30 28 31 30   GLUCOSE 96 104*  --  100* 99 98 103*  BUN 27* 25*  --  24* 25* 25* 23*  CREATININE 1.28* 1.32* 1.37* 1.30* 1.28* 1.46* 1.30*  ALBUMIN 2.4* 2.1*  --   --   --   --  1.9*  CALCIUM 7.9* 7.8*  --  7.8* 8.1* 8.1* 7.7*  PHOS  --   --   --   --   --   --  3.9  AST 22 24  --   --   --   --   --   ALT 22 23  --   --   --   --   --    Liver Function Tests:  Recent Labs Lab 08/22/15 1201 08/22/15 1346 08/26/15 0458  AST 22 24  --   ALT 22 23  --   ALKPHOS 81 80  --   BILITOT 0.5 0.6  --   PROT 5.4* 5.4*  --   ALBUMIN 2.4* 2.1* 1.9*    Recent Labs Lab 08/22/15 1346  LIPASE 39   No results for input(s): AMMONIA in the last 168 hours. CBC:  Recent Labs Lab 08/22/15 1346 08/22/15 1728  08/24/15 0726 08/25/15 0410 08/26/15 0458  WBC 7.6 7.7  --  6.4 6.7 6.6  HGB 11.5* 11.0*  --  11.9* 11.5* 11.6*  HCT 35.6* 34.1*  < > 37.6* 36.4* 36.7*  MCV 86.2 87.0  --  85.8 85.4 85.7  PLT 291 294  --  339 340 321  < > = values in this interval not displayed. Cardiac Enzymes: No results for  input(s): CKTOTAL, CKMB, CKMBINDEX, TROPONINI in the last 168 hours. CBG:  Recent Labs Lab 08/25/15 0551 08/25/15 1207 08/25/15 1558 08/25/15 2127 08/26/15 0646  GLUCAP 101* 99 109* 120* 94    Iron Studies: No results for input(s): IRON, TIBC, TRANSFERRIN, FERRITIN in the last 72 hours. Studies/Results: US Renal  08/24/2015  CLINICAL DATA:  Acute kidney injury EXAM: RENAL / URINARY TRACT ULTRASOUND COMPLETE COMPARISON:  None. FINDINGS: Right Kidney: Length: 15.6 cm. Numerous cysts in the upper and midpole all measuring less than or equal to 5 cm. No hydronephrosis. Normal echogenicity. Left Kidney: Length: 15.1 cm. Two lower pole cysts 1 measuring 4.4 cm in the other measuring 3.6 cm. No hydronephrosis. Normal echogenicity. Bladder: Appears normal  for degree of bladder distention. IMPRESSION: Bilateral simple cysts.  No acute abnormalities. Electronically Signed   By: Skipper Cliche M.D.   On: 08/24/2015 08:28   . amLODipine  5 mg Oral Daily  . enoxaparin (LOVENOX) injection  75 mg Subcutaneous Q24H  . furosemide  80 mg Intravenous BID  . insulin aspart  0-9 Units Subcutaneous TID WC  . sodium chloride  3 mL Intravenous Q12H    BMET    Component Value Date/Time   NA 138 08/26/2015 0458   K 3.6 08/26/2015 0458   CL 101 08/26/2015 0458   CO2 30 08/26/2015 0458   GLUCOSE 103* 08/26/2015 0458   BUN 23* 08/26/2015 0458   CREATININE 1.30* 08/26/2015 0458   CREATININE 1.28* 08/22/2015 1201   CALCIUM 7.7* 08/26/2015 0458   GFRNONAA 56* 08/26/2015 0458   GFRAA >60 08/26/2015 0458   CBC    Component Value Date/Time   WBC 6.6 08/26/2015 0458   WBC 7.8 08/22/2015 1224   RBC 4.28 08/26/2015 0458   RBC 4.06* 08/23/2015 0337   RBC 4.33* 08/22/2015 1224   HGB 11.6* 08/26/2015 0458   HGB 11.2* 08/22/2015 1224   HCT 36.7* 08/26/2015 0458   HCT 33.4* 08/23/2015 0337   HCT 35.9* 08/22/2015 1224   PLT 321 08/26/2015 0458   MCV 85.7 08/26/2015 0458   MCV 82.9 08/22/2015 1224   MCH  27.1 08/26/2015 0458   MCH 25.8* 08/22/2015 1224   MCHC 31.6 08/26/2015 0458   MCHC 31.1* 08/22/2015 1224   RDW 12.7 08/26/2015 0458   LYMPHSABS 2.2 03/11/2015 1425   MONOABS 0.5 03/11/2015 1425   EOSABS 0.1 03/11/2015 1425   BASOSABS 0.0 03/11/2015 1425     Assessment/Plan: 1. Anasarca/Nephrotic syndrome- normal EF, new onset nephrotic range proteinuria associated with drop in albumin concerning for nephrotic syndrome. DDx includes minimal change disease, FSGS, and Membranous.  1. initiated workup with serologies and 24 hour urine for protein, will also check SPEP/UPEP  2. renal biopsy 08/26/15 appreciate IR assistance.  3. For now will continue with IV diuresis, continue to hold NSAIDs (which have been associated with nephrotic syndrome/membranous).  4. Limit sodium and fluid intake. Monitor daily weights and strict I's/O's.  5. D/c IV lasix and switch to po and see how he responds 2. AKI- likely related to ischemic ATN in setting of nephrotic syndrome and aggressive diuresis, await renal biopsy. 1. Continue to hold lisinopril for now 2. Scr improving with above interventions and await renal biopsy 3. HTN- BP elevated, will increase diuresis and titrate meds.  4. Obesity 5. Deconditioning 6. DDD s/p laminectomy- stop NSAIDs 7. Anemia- iron deficiency, replete and follow  [place] A

## 2015-08-27 DIAGNOSIS — I5031 Acute diastolic (congestive) heart failure: Secondary | ICD-10-CM

## 2015-08-27 LAB — BASIC METABOLIC PANEL
Anion gap: 10 (ref 5–15)
BUN: 23 mg/dL — ABNORMAL HIGH (ref 6–20)
CO2: 29 mmol/L (ref 22–32)
Calcium: 7.9 mg/dL — ABNORMAL LOW (ref 8.9–10.3)
Chloride: 102 mmol/L (ref 101–111)
Creatinine, Ser: 1.41 mg/dL — ABNORMAL HIGH (ref 0.61–1.24)
GFR calc Af Amer: 58 mL/min — ABNORMAL LOW (ref >60)
GFR calc non Af Amer: 50 mL/min — ABNORMAL LOW (ref >60)
Glucose, Bld: 95 mg/dL (ref 65–99)
Potassium: 3.5 mmol/L (ref 3.5–5.1)
Sodium: 141 mmol/L (ref 135–145)

## 2015-08-27 LAB — GLUCOSE, CAPILLARY
Glucose-Capillary: 104 mg/dL — ABNORMAL HIGH (ref 65–99)
Glucose-Capillary: 130 mg/dL — ABNORMAL HIGH (ref 65–99)

## 2015-08-27 LAB — UIFE/LIGHT CHAINS/TP QN, 24-HR UR
% BETA, Urine: 7.5 %
ALPHA 1 URINE: 7.8 %
Albumin, U: 73.5 %
Alpha 2, Urine: 2.7 %
Free Kappa/Lambda Ratio: 1.15 — ABNORMAL LOW (ref 2.04–10.37)
Free Lambda Lt Chains,Ur: 30.9 mg/L — ABNORMAL HIGH (ref 0.24–6.66)
Free Lt Chn Excr Rate: 35.4 mg/L — ABNORMAL HIGH (ref 1.35–24.19)
GAMMA GLOBULIN URINE: 8.4 %
M-SPIKE %, Urine: 4 % — ABNORMAL HIGH
M-Spike, Mg/24 Hr: 555 mg/24 hr — ABNORMAL HIGH
Time: 24 hours
Total Protein, Urine-Ur/day: 13867.2 mg/24 hr — ABNORMAL HIGH (ref 30.0–150.0)
Total Protein, Urine: 385.2 mg/dL
Volume, Urine: 3600 mL

## 2015-08-27 LAB — CBC
HCT: 35.9 % — ABNORMAL LOW (ref 39.0–52.0)
Hemoglobin: 11.5 g/dL — ABNORMAL LOW (ref 13.0–17.0)
MCH: 27.6 pg (ref 26.0–34.0)
MCHC: 32 g/dL (ref 30.0–36.0)
MCV: 86.3 fL (ref 78.0–100.0)
Platelets: 316 10*3/uL (ref 150–400)
RBC: 4.16 MIL/uL — ABNORMAL LOW (ref 4.22–5.81)
RDW: 12.8 % (ref 11.5–15.5)
WBC: 7.1 10*3/uL (ref 4.0–10.5)

## 2015-08-27 MED ORDER — SITAGLIPTIN PHOSPHATE 50 MG PO TABS: 50.0000 mg | ORAL_TABLET | Freq: Every day | ORAL | Status: DC

## 2015-08-27 MED ORDER — FUROSEMIDE 80 MG PO TABS: 80.0000 mg | ORAL_TABLET | Freq: Two times a day (BID) | ORAL | Status: DC

## 2015-08-27 MED ORDER — AMLODIPINE BESYLATE 10 MG PO TABS: 10.0000 mg | ORAL_TABLET | Freq: Every day | ORAL | Status: DC

## 2015-08-27 NOTE — Progress Notes (Signed)
Pt had QTC of 515 at 0940  and at 0957 QTC 491.  Pt asymptomatic.  Notified Dr. Carles Collet.  Will continue to monitor.  Karie Kirks, Therapist, sports.

## 2015-08-27 NOTE — Discharge Summary (Signed)
Physician Discharge Summary  Gregory Diaz YQI:[id number] DOB: 07-Nov-1951 DOA: 08/22/2015  PCP: Purvis Kilts, MD  Admit date: 08/22/2015 Discharge date: 08/27/2015  Recommendations for Outpatient Follow-up:  1. Pt will need to follow up with PCP in 2 weeks post discharge 2. Please obtain BMP 1 week 3. Please follow up on results of quantitative immunoglobulins and urine immunofixation  Discharge Diagnoses:  Pulmonary Edema/Nephrotic syndrome/Acute Diastolic CHF -CXR: CHF, early pulm edema -suspect underlying OSA/OHS--will ultimately need polysomnogram -cannot use BB at this time due to bradycardia -cannot use ACEi due to renal failure--discussed with nephrology--Dr. Marval Regal -home with lasix 80 mg po bid -BNP 410 -Echocardiogram: EF 38-75%, diastolic function normal, PA pressure 50mmHg, ascending aorta mildly dilated -will need CTA chest in near future to fully assess aorta once renal function stablizes -monitor daily weights, intake/output. -pt has nephrotic range proteinuria  -urine protein/creatinine ratio 5.97 -NEG 8.6 L since admission -NEG 9 pounds since admission  Acute kidney injury with proteinuria and hematuria/Nephrotic syndrome -secondary to nephrotic syndrome in the setting of NSAID use and hemodynamic changes -Baseline creatinine approximately 0.9, 1.37 upon admission -Cr 1.30 today -Nephrology appreciated -C3, C4, ANA, CH50, ANCA, ANA, ds DNA, ASO--all unrevealing -HIV, viral hepatitis serology--neg -08/24/2015 Renal ultrasound negative for hydronephrosis -renal biopsy postponed until 08/26/15 -SPEP--minimal M-spike with elevated free kappa and lambda light chains -08/26/15--discussed with heme/onc--Dr. Sherrill--results not consistent with myeloma--await urine immunofixation, order serum immunoglobulins -24 hour urine immunifixation--pending -quantitative immunoglobins pending -Pt will be contacted for outpt followup appt with Dr.  Benay Spice  Normocytic anemia -Anemia panel: Iron 36, ferritin 116, saturation ratio 17, vitamin B12 334 -LDH 148, Haptoglobin 397 -Hb baseline 14, currently 11--remains stable during the admission -FOBT--positive -Hgb stable throughout hospitalization -08/31/2009 colonoscopy normal (Dr. Aviva Signs) -outpt GI Followup  Hypoalbuminemia -Appreciate nutrition  New diabetes mellitus, type 2 -08/22/15--HbA1c 6.6 -Continue ISS and CBG monitoring -plan to d/c home on oral agent -D/C home on Januvia 50 mg daily -follow up with PCP for further titration  Hypertension -Lisinopril held -Continue lasix, amlodipine  -increase amlodipine to 10mg  daily  Low back pain/Spinal Stenosis--Lumbar -Meloxicam held due to acute renal injury -Continue Percocet PRN -s/p fusion laminectomy of L3-4 on 05/20/15  -instructed pt to avoid all NSAIDS  Discharge Condition: stable  Disposition: home  Diet:heart healthy Wt Readings from Last 3 Encounters:  08/27/15 144.443 kg (318 lb 7 oz)  08/22/15 152.409 kg (336 lb)  05/31/15 139.663 kg (307 lb 14.4 oz)    History of present illness:  64 y.o. male with a past medical history significant for HTN, obesity, and chronic low back pain who presents withone week increased swelling in the legs scrotum and arms as well as shortness of breath on exertion. The patient describes symptoms of developed subacutely over the last 1-2 weeks. These have included progressive swelling in his legs, scrotum, hands, arms, belly, and face. He notes that he's gained 30 pounds in the last few weeks. He also notes that he has shortness of breath any exertion. He has pain in the legs and groin and low back, related to swelling. In the ED, he was bradycardic and hypertensive and edematous. He had an increase in serum creatinine, mild normocytic anemia, low albumin, elevated BNP. His chest x-ray was consistent with pulmonary edema and [facility] were asked to admit for acute CHF.  Workup  revealed nephrotic syndrome.  Nephrology was consulted and the patient was started on IV lasix with good clinical effect.  He was neg 8.6  L for the admission.  He was transitioned to po lasix and monitored.  He remained clinically stable.  Renal biopsy was obtained, but results are pending at the time of d/c.  He will have a follow up appointment with nephrology.  The pt will also be contacted for a follow up appt with Dr. Benay Spice regarding his elevated lambda and kappa light chains.    Consultants: nephrology  Discharge Exam: Filed Vitals:   08/27/15 1235  BP: 158/93  Pulse: 73  Temp: 98.3 F (36.8 C)  Resp: 20   Filed Vitals:   08/27/15 0005 08/27/15 0605 08/27/15 0900 08/27/15 1235  BP: 165/96 152/77 141/87 158/93  Pulse: 56 60 76 73  Temp: 98.4 F (36.9 C) 98.2 F (36.8 C)  98.3 F (36.8 C)  TempSrc: Oral Oral  Oral  Resp: 22 21  20   Height:      Weight:  144.443 kg (318 lb 7 oz)    SpO2: 97% 97%  99%   General: A&O x 3, NAD, pleasant, cooperative Cardiovascular: RRR, no rub, no gallop, no S3 Respiratory: fine bibasilar crackles. No wheeze Abdomen:soft, nontender, nondistended, positive bowel sounds Extremities: 1+ LE edema, No lymphangitis, no petechiae  Discharge Instructions      Discharge Instructions    Diet - low sodium heart healthy    Complete by:  As directed      Increase activity slowly    Complete by:  As directed             Medication List    STOP taking these medications        ALEVE PM 220-25 MG Tabs  Generic drug:  Naproxen Sod-Diphenhydramine     lisinopril-hydrochlorothiazide 20-12.5 MG tablet  Commonly known as:  PRINZIDE,ZESTORETIC      TAKE these medications        amLODipine 10 MG tablet  Commonly known as:  NORVASC  Take 1 tablet (10 mg total) by mouth daily.     furosemide 80 MG tablet  Commonly known as:  LASIX  Take 1 tablet (80 mg total) by mouth 2 (two) times daily.     oxyCODONE-acetaminophen 10-325 MG tablet   Commonly known as:  PERCOCET  Take 1 tablet by mouth every 4 (four) hours as needed for pain.     sitaGLIPtin 50 MG tablet  Commonly known as:  JANUVIA  Take 1 tablet (50 mg total) by mouth daily.         The results of significant diagnostics from this hospitalization (including imaging, microbiology, ancillary and laboratory) are listed below for reference.    Significant Diagnostic Studies: Dg Chest 2 View  08/22/2015  CLINICAL DATA:  Shortness of breath, 5 days duration. EXAM: CHEST  2 VIEW COMPARISON:  06/09/2014 FINDINGS: The heart is enlarged. The aorta is unfolded. There is interstitial and early alveolar pulmonary edema with a small amount of fluid in the fissures. No large effusion. No significant bony finding. IMPRESSION: Congestive heart failure/early pulmonary edema. Electronically Signed   By: Nelson Chimes M.D.   On: 08/22/2015 13:17   US Renal  08/24/2015  CLINICAL DATA:  Acute kidney injury EXAM: RENAL / URINARY TRACT ULTRASOUND COMPLETE COMPARISON:  None. FINDINGS: Right Kidney: Length: 15.6 cm. Numerous cysts in the upper and midpole all measuring less than or equal to 5 cm. No hydronephrosis. Normal echogenicity. Left Kidney: Length: 15.1 cm. Two lower pole cysts 1 measuring 4.4 cm in the other measuring 3.6 cm. No hydronephrosis. Normal echogenicity.  Bladder: Appears normal for degree of bladder distention. IMPRESSION: Bilateral simple cysts.  No acute abnormalities. Electronically Signed   By: Skipper Cliche M.D.   On: 08/24/2015 08:28   US Biopsy  08/26/2015  CLINICAL DATA:  Anasarca and nephrotic syndrome. The patient requires renal biopsy. EXAM: ULTRASOUND GUIDED CORE BIOPSY OF RIGHT KIDNEY MEDICATIONS: 1.0 mg IV Versed; 50 mcg IV Fentanyl Total Moderate Sedation Time: 14 minutes. PROCEDURE: The procedure, risks, benefits, and alternatives were explained to the patient. Questions regarding the procedure were encouraged and answered. The patient understands and  consents to the procedure. A time-out was performed prior to the procedure. The right flank region was prepped with chlorhexidine in a sterile fashion, and a sterile drape was applied covering the operative field. A sterile gown and sterile gloves were used for the procedure. Local anesthesia was provided with 1% Lidocaine. Ultrasound was performed of both kidneys. The right was chosen for biopsy. Under ultrasound guidance, a 16 gauge core biopsy device was advanced into a lower pole cortex. Two separate 16 gauge core biopsy samples were obtained and submitted in saline additional ultrasound was performed. COMPLICATIONS: None. FINDINGS: The right kidney was better visualized by ultrasound in the prone position. There were several cysts emanating from the lower pole of the left kidney. Solid tissue was obtained from the right kidney. IMPRESSION: Ultrasound-guided core biopsy performed of the right kidney at the level of lower pole cortex. Electronically Signed   By: Aletta Edouard M.D.   On: 08/26/2015 18:20     Microbiology: No results found for this or any previous visit (from the past 240 hour(s)).   Labs: Basic Metabolic Panel:  Recent Labs Lab 08/23/15 0337 08/24/15 0726 08/25/15 0410 08/26/15 0458 08/27/15 0412  NA 142 141 142 138 141  K 3.7 4.1 3.6 3.6 3.5  CL 106 104 101 101 102  CO2 30 28 31 30 29   GLUCOSE 100* 99 98 103* 95  BUN 24* 25* 25* 23* 23*  CREATININE 1.30* 1.28* 1.46* 1.30* 1.41*  CALCIUM 7.8* 8.1* 8.1* 7.7* 7.9*  PHOS  --   --   --  3.9  --    Liver Function Tests:  Recent Labs Lab 08/22/15 1201 08/22/15 1346 08/26/15 0458  AST 22 24  --   ALT 22 23  --   ALKPHOS 81 80  --   BILITOT 0.5 0.6  --   PROT 5.4* 5.4*  --   ALBUMIN 2.4* 2.1* 1.9*    Recent Labs Lab 08/22/15 1346  LIPASE 39   No results for input(s): AMMONIA in the last 168 hours. CBC:  Recent Labs Lab 08/24/15 0726 08/25/15 0410 08/26/15 0458 08/26/15 1709 08/27/15 0412  WBC 6.4  6.7 6.6 8.1 7.1  HGB 11.9* 11.5* 11.6* 11.1* 11.5*  HCT 37.6* 36.4* 36.7* 35.0* 35.9*  MCV 85.8 85.4 85.7 85.8 86.3  PLT 339 340 321 323 316   Cardiac Enzymes: No results for input(s): CKTOTAL, CKMB, CKMBINDEX, TROPONINI in the last 168 hours. BNP: Invalid input(s): POCBNP CBG:  Recent Labs Lab 08/26/15 1142 08/26/15 1656 08/26/15 2045 08/27/15 0620 08/27/15 1140  GLUCAP 105* 110* 122* 104* 130*    Time coordinating discharge:  Greater than 30 minutes  Signed:  Hartlyn Reigel, DO [facility] Pager: [phone] 08/27/2015, 2:02 PM

## 2015-08-27 NOTE — Progress Notes (Signed)
Patient ID: Gregory Diaz, male   DOB: 1951-10-06, 64 y.o.   MRN: [id number] S:feels well but still with diffuse aches O:BP 141/87 mmHg  Pulse 76  Temp(Src) 98.2 F (36.8 C) (Oral)  Resp 21  Ht 6\' 2"  (1.88 m)  Wt 144.443 kg (318 lb 7 oz)  BMI 40.87 kg/m2  SpO2 97%  Intake/Output Summary (Last 24 hours) at 08/27/15 1209 Last data filed at 08/27/15 0830  Gross per 24 hour  Intake    840 ml  Output    950 ml  Net   -110 ml   Intake/Output: I/O last 3 completed shifts: In: 600 [P.O.:600] Out: 2825 [Urine:2825]  Intake/Output this shift:  Total I/O In: 480 [P.O.:480] Out: 200 [Urine:200] Weight change: -0.408 kg (-14.4 oz) Gen:WD obese AAM in NAD CVS:no rub Resp:cta ULA:GTXMIW Ext:1+ edema   Recent Labs Lab 08/22/15 1201 08/22/15 1346 08/22/15 1728 08/23/15 0337 08/24/15 0726 08/25/15 0410 08/26/15 0458 08/27/15 0412  NA 144 138  --  142 141 142 138 141  K 4.1 3.8  --  3.7 4.1 3.6 3.6 3.5  CL 108 105  --  106 104 101 101 102  CO2 29 27  --  30 28 31 30 29   GLUCOSE 96 803*  --  100* 99 98 103* 95  BUN 27* 25*  --  24* 25* 25* 23* 23*  CREATININE 1.28* 1.32* 1.37* 1.30* 1.28* 1.46* 1.30* 1.41*  ALBUMIN 2.4* 2.1*  --   --   --   --  1.9*  --   CALCIUM 7.9* 7.8*  --  7.8* 8.1* 8.1* 7.7* 7.9*  PHOS  --   --   --   --   --   --  3.9  --   AST 22 24  --   --   --   --   --   --   ALT 22 23  --   --   --   --   --   --    Liver Function Tests:  Recent Labs Lab 08/22/15 1201 08/22/15 1346 08/26/15 0458  AST 22 24  --   ALT 22 23  --   ALKPHOS 81 80  --   BILITOT 0.5 0.6  --   PROT 5.4* 5.4*  --   ALBUMIN 2.4* 2.1* 1.9*    Recent Labs Lab 08/22/15 1346  LIPASE 39   No results for input(s): AMMONIA in the last 168 hours. CBC:  Recent Labs Lab 08/24/15 0726 08/25/15 0410 08/26/15 0458 08/26/15 1709 08/27/15 0412  WBC 6.4 6.7 6.6 8.1 7.1  HGB 11.9* 11.5* 11.6* 11.1* 11.5*  HCT 37.6* 36.4* 36.7* 35.0* 35.9*  MCV 85.8 85.4 85.7 85.8 86.3  PLT  339 340 321 323 316   Cardiac Enzymes: No results for input(s): CKTOTAL, CKMB, CKMBINDEX, TROPONINI in the last 168 hours. CBG:  Recent Labs Lab 08/26/15 0646 08/26/15 1142 08/26/15 1656 08/26/15 2045 08/27/15 0620  GLUCAP 94 105* 110* 122* 104*    Iron Studies: No results for input(s): IRON, TIBC, TRANSFERRIN, FERRITIN in the last 72 hours. Studies/Results: US Biopsy  08/26/2015  CLINICAL DATA:  Anasarca and nephrotic syndrome. The patient requires renal biopsy. EXAM: ULTRASOUND GUIDED CORE BIOPSY OF RIGHT KIDNEY MEDICATIONS: 1.0 mg IV Versed; 50 mcg IV Fentanyl Total Moderate Sedation Time: 14 minutes. PROCEDURE: The procedure, risks, benefits, and alternatives were explained to the patient. Questions regarding the procedure were encouraged and answered. The patient understands and consents to  the procedure. A time-out was performed prior to the procedure. The right flank region was prepped with chlorhexidine in a sterile fashion, and a sterile drape was applied covering the operative field. A sterile gown and sterile gloves were used for the procedure. Local anesthesia was provided with 1% Lidocaine. Ultrasound was performed of both kidneys. The right was chosen for biopsy. Under ultrasound guidance, a 16 gauge core biopsy device was advanced into a lower pole cortex. Two separate 16 gauge core biopsy samples were obtained and submitted in saline additional ultrasound was performed. COMPLICATIONS: None. FINDINGS: The right kidney was better visualized by ultrasound in the prone position. There were several cysts emanating from the lower pole of the left kidney. Solid tissue was obtained from the right kidney. IMPRESSION: Ultrasound-guided core biopsy performed of the right kidney at the level of lower pole cortex. Electronically Signed   By: Aletta Edouard M.D.   On: 08/26/2015 18:20   . amLODipine  5 mg Oral Daily  . carbamazepine  200 mg Oral Daily  . enoxaparin (LOVENOX) injection  75  mg Subcutaneous Q24H  . furosemide  80 mg Oral BID  . insulin aspart  0-9 Units Subcutaneous TID WC  . sodium chloride  3 mL Intravenous Q12H    BMET    Component Value Date/Time   NA 141 08/27/2015 0412   K 3.5 08/27/2015 0412   CL 102 08/27/2015 0412   CO2 29 08/27/2015 0412   GLUCOSE 95 08/27/2015 0412   BUN 23* 08/27/2015 0412   CREATININE 1.41* 08/27/2015 0412   CREATININE 1.28* 08/22/2015 1201   CALCIUM 7.9* 08/27/2015 0412   GFRNONAA 50* 08/27/2015 0412   GFRAA 58* 08/27/2015 0412   CBC    Component Value Date/Time   WBC 7.1 08/27/2015 0412   WBC 7.8 08/22/2015 1224   RBC 4.16* 08/27/2015 0412   RBC 4.06* 08/23/2015 0337   RBC 4.33* 08/22/2015 1224   HGB 11.5* 08/27/2015 0412   HGB 11.2* 08/22/2015 1224   HCT 35.9* 08/27/2015 0412   HCT 33.4* 08/23/2015 0337   HCT 35.9* 08/22/2015 1224   PLT 316 08/27/2015 0412   MCV 86.3 08/27/2015 0412   MCV 82.9 08/22/2015 1224   MCH 27.6 08/27/2015 0412   MCH 25.8* 08/22/2015 1224   MCHC 32.0 08/27/2015 0412   MCHC 31.1* 08/22/2015 1224   RDW 12.8 08/27/2015 0412   LYMPHSABS 2.2 03/11/2015 1425   MONOABS 0.5 03/11/2015 1425   EOSABS 0.1 03/11/2015 1425   BASOSABS 0.0 03/11/2015 1425     Assessment/Plan: 1. Anasarca/Nephrotic syndrome- normal EF, new onset nephrotic range proteinuria associated with drop in albumin concerning for nephrotic syndrome. DDx includes minimal change disease, FSGS, and Membranous.  1. initiated workup with serologies and 24 hour urine for protein, will also check SPEP/UPEP  2. renal biopsy 08/26/15 appreciate IR assistance.  3. For now will continue with IV diuresis, continue to hold NSAIDs (which have been associated with nephrotic syndrome/membranous).  4. Limit sodium and fluid intake. Monitor daily weights and strict I's/O's.  5. Switched to po lasix 08/26/15 with good response.   6. Stop lovenox following invasive procedure 2. AKI- likely related to ischemic ATN in setting of  nephrotic syndrome and aggressive diuresis, await renal biopsy. 1. Continue to hold lisinopril for now 2. Scr improving with above interventions and await renal biopsy report 3. HTN- BP elevated, will increase diuresis and titrate meds.  4. Obesity 5. Deconditioning 6. DDD s/p laminectomy- stop NSAIDs 7. Anemia-  iron deficiency, replete and follow 8. Disposition- if able to void without difficulty, okay to discharge to home and continue with lasix 80mg  bid and low sodium diet and fluid restriction.  Will follow up in my office in the next 2 weeks and will contact patient once biopsy results are obtained to discuss treatment options.  [place] A

## 2015-08-27 NOTE — Progress Notes (Signed)
All d/c instructions explained and given to pt.  Verbalized understanding. .  Kaiden Pech, RN. 

## 2015-08-27 NOTE — Progress Notes (Signed)
At 1640 pt d/c to home.  Off floor via w/c.  Erika Hussar,RN.

## 2015-08-28 LAB — IMMUNOGLOBULINS A/E/G/M, SERUM
IgA: 162 mg/dL (ref 61–437)
IgE (Immunoglobulin E), Serum: 53 IU/mL (ref 0–100)
IgG (Immunoglobin G), Serum: 992 mg/dL (ref 700–1600)
IgM, Serum: 46 mg/dL (ref 20–172)

## 2015-08-30 ENCOUNTER — Telehealth: Payer: Self-pay | Admitting: Oncology

## 2015-08-30 NOTE — Telephone Encounter (Signed)
Left message for patient to return call [phone]

## 2015-09-01 DIAGNOSIS — N049 Nephrotic syndrome with unspecified morphologic changes: Secondary | ICD-10-CM | POA: Diagnosis not present

## 2015-09-01 DIAGNOSIS — J811 Chronic pulmonary edema: Secondary | ICD-10-CM | POA: Diagnosis not present

## 2015-09-01 DIAGNOSIS — E119 Type 2 diabetes mellitus without complications: Secondary | ICD-10-CM | POA: Diagnosis not present

## 2015-09-01 DIAGNOSIS — I5031 Acute diastolic (congestive) heart failure: Secondary | ICD-10-CM | POA: Diagnosis not present

## 2015-09-01 DIAGNOSIS — E8809 Other disorders of plasma-protein metabolism, not elsewhere classified: Secondary | ICD-10-CM | POA: Diagnosis not present

## 2015-09-01 DIAGNOSIS — I10 Essential (primary) hypertension: Secondary | ICD-10-CM | POA: Diagnosis not present

## 2015-09-01 DIAGNOSIS — M4806 Spinal stenosis, lumbar region: Secondary | ICD-10-CM | POA: Diagnosis not present

## 2015-09-01 DIAGNOSIS — D6489 Other specified anemias: Secondary | ICD-10-CM | POA: Diagnosis not present

## 2015-09-01 DIAGNOSIS — Z6841 Body Mass Index (BMI) 40.0 and over, adult: Secondary | ICD-10-CM | POA: Diagnosis not present

## 2015-09-03 ENCOUNTER — Encounter (HOSPITAL_COMMUNITY): Payer: Self-pay

## 2015-09-06 DIAGNOSIS — N041 Nephrotic syndrome with focal and segmental glomerular lesions: Secondary | ICD-10-CM | POA: Diagnosis not present

## 2015-09-06 DIAGNOSIS — N182 Chronic kidney disease, stage 2 (mild): Secondary | ICD-10-CM | POA: Diagnosis not present

## 2015-09-06 DIAGNOSIS — I10 Essential (primary) hypertension: Secondary | ICD-10-CM | POA: Diagnosis not present

## 2015-09-06 DIAGNOSIS — E1122 Type 2 diabetes mellitus with diabetic chronic kidney disease: Secondary | ICD-10-CM | POA: Diagnosis not present

## 2015-09-06 DIAGNOSIS — D509 Iron deficiency anemia, unspecified: Secondary | ICD-10-CM | POA: Diagnosis not present

## 2015-09-07 DIAGNOSIS — D509 Iron deficiency anemia, unspecified: Secondary | ICD-10-CM | POA: Diagnosis not present

## 2015-09-07 DIAGNOSIS — N041 Nephrotic syndrome with focal and segmental glomerular lesions: Secondary | ICD-10-CM | POA: Diagnosis not present

## 2015-09-08 ENCOUNTER — Encounter (HOSPITAL_COMMUNITY): Payer: Self-pay

## 2015-09-08 ENCOUNTER — Ambulatory Visit (HOSPITAL_COMMUNITY): Payer: Medicare HMO | Admitting: Oncology

## 2015-09-09 ENCOUNTER — Encounter (HOSPITAL_COMMUNITY): Payer: Self-pay | Admitting: Oncology

## 2015-09-09 DIAGNOSIS — R778 Other specified abnormalities of plasma proteins: Secondary | ICD-10-CM

## 2015-09-09 HISTORY — DX: Other specified abnormalities of plasma proteins: R77.8

## 2015-09-09 NOTE — Assessment & Plan Note (Addendum)
SPEP showing a small M-Spike of 0.5% that may represent a monoclonal protein with significant elevation of kappa and lambda light chains with a normal ratio and a UPEP showing 13867 mg/24hr of protein and M-Spike of 555 mg/24hr in the setting of a renal biopsy demonstrating mild focal segmental glomerulosclerosis.  I personally reviewed and went over laboratory results with the patient.  The results are noted within this dictation.  I personally reviewed and went over laboratory results with the patient.  The results are noted within this dictation.  CRAB issues reviewed: Calcium is not elevated Renal function WNL until recent hospitalization Anemia is nonexistent until recent hospitalization Bone lesion- no images available except for imaging of spine and abdomen that were not impressive.  The International Myeloma Working Group criteria for the diagnosis of MM emphasize the importance of end organ damage in making the diagnosis.  The diagnosis of MM requires the fulfillment of the following criterion: ?Clonal bone marrow plasma cells ?10 percent or biopsy-proven bony or soft tissue plasmacytoma - Clonality should be established by showing a kappa/lambda light-chain restriction on flow cytometry, immunohistochemistry, or immunofluorescence. Bone marrow plasma cell percentage should be estimated from a core biopsy specimen, when possible. If there is disparity between the aspirate and core biopsy, the highest value should be used. Approximately 4 percent of patients may have fewer than 10 percent bone marrow plasma cells since marrow involvement may be focal, rather than diffuse. Repeat bone marrow biopsy should be considered in such patients. PLUS one of the following: ?Presence of related organ or tissue impairment (often recalled by the acronym CRAB) - End organ damage is suggested by increased plasma calcium level, renal insufficiency, anemia, and bone lesions. In order to be included as  diagnostic criteria, changes in these factors must be felt to be related to the underlying plasma cell proliferative disorder. For these purposes, the following definitions are used:  .Anemia - Hemoglobin <10 g/dL (<100 g/L) or >2 g/dL (>20 g/L) below normal  .Hypercalcemia - Serum calcium >11 mg/dL (>2.75 mmol/liter). Consider other causes of hypercalcemia (eg, hyperparathyroidism).   .Renal insufficiency - Estimated or measured creatine clearance <40 mL/min or serum creatinine >2 mg/dL (177 mol/liter). Of these, creatinine clearance is the preferred measure of renal insufficiency because normal serum creatinine levels vary by age, sex, and race. Using creatinine clearance ensures that a similar level of renal dysfunction is required to end organ damage.  .Bone lesions - One or more osteolytic lesions ?5 mm in size on skeletal radiography, MRI, CT, or PET/CT. In the absence of osteolytic lesions, the following are not sufficient markers of bone lesions: increased FDG uptake on PET, osteoporosis, or vertebral compression fracture. When a diagnosis is in doubt, biopsy of the bone lesion should be considered.   Will set patient up for skeletal survey.  Will set patient up for a bone marrow aspiration and biopsy.  Labs today: CBC diff, CMET, anemia panel, LDH, ESR, CRP, MM panel, and B2M.  UPEP with 24 hour protein.  Influenza vaccine today.  Return in 2-3 weeks for follow-up.

## 2015-09-09 NOTE — Progress Notes (Signed)
[facility] Hematology/Oncology Consultation   Name: Gregory Diaz      MRN: [id number]      Date: 09/10/2015 Time:1:16 PM   REFERRING PHYSICIAN:  Orson Eva, MD (Hospitalist)  REASON FOR CONSULT: Abnormal SPEP   DIAGNOSIS:  08/24/2015 SPEP showing a small M-Spike of 0.5% that may represent a monoclonal protein with significant elevation of kappa and lambda light chains with a normal ratio and a UPEP showing 13867 mg/24hr of protein and M-Spike of 555 mg/24hr in the setting of a renal biopsy demonstrating mild focal segmental glomerulosclerosis.  HISTORY OF PRESENT ILLNESS:   Gregory Diaz is a 64 year old black American man with a past medical history significant for nephrotic syndrome, HTN, diastolic CHL, and obesity who is referred to [facility] for abnormal SPEP.  I personally reviewed and went over laboratory results with the patient.  The results are noted within this dictation.  I personally reviewed and went over radiographic studies with the patient.  The results are noted within this dictation.    Chart is reviewed.  Recent hospitalization noted for pulmonary edema, nephrotic syndrome, acute diastolic CHF, acute renal injury with proteinuria and hematuria, and newly discovered DM type 2.  He has a number of complaints related to his excess edema.  He reports some odd symptoms that I cannot quite relate to his edema.  Gregory Diaz is very talkative and I had a difficult time keeping him on track.  He denies any B symptoms.  He reports nonspecific abdominal and groin pain.  He is ambulatory.  He is accompanied by his wife.  I provided him education regarding his renal issue and the role of seeing [place] today.  He understands that our role is to rule out a primary bone marrow issue, such as multiple myeloma.  I provided elementary eduction regarding myeloma, but he is told that this is purely educational only.  PAST MEDICAL HISTORY:   Past Medical History  Diagnosis  Date  . Hypertension   . Heart murmur     was told at age 74, but has not had any problems  . Enlarged prostate   . Anxiety   . Early cataracts, bilateral   . Arthritis     DDD, low back  . Abnormal SPEP 09/09/2015    ALLERGIES: Allergies  Allergen Reactions  . Penicillins Swelling    Has patient had a PCN reaction causing immediate rash, facial/tongue/throat swelling, SOB or lightheadedness with hypotension: Yes Has patient had a PCN reaction causing severe rash involving mucus membranes or skin necrosis: No Has patient had a PCN reaction that required hospitalization No Has patient had a PCN reaction occurring within the last 10 years: No If all of the above answers are "NO", then may proceed with Cephalosporin use.       MEDICATIONS: I have reviewed the patient's current medications.    Current Outpatient Prescriptions on File Prior to Visit  Medication Sig Dispense Refill  . amLODipine (NORVASC) 10 MG tablet Take 1 tablet (10 mg total) by mouth daily. 30 tablet 1  . furosemide (LASIX) 80 MG tablet Take 1 tablet (80 mg total) by mouth 2 (two) times daily. 60 tablet 1  . oxyCODONE-acetaminophen (PERCOCET) 10-325 MG tablet Take 1 tablet by mouth every 4 (four) hours as needed for pain.    . sitaGLIPtin (JANUVIA) 50 MG tablet Take 1 tablet (50 mg total) by mouth daily. 30 tablet 1   No current  facility-administered medications on file prior to visit.     PAST SURGICAL HISTORY Past Surgical History  Procedure Laterality Date  . Hemorroidectomy    . Fracture surgery Right     thumb  . Colonoscopy    . Hernia repair      umbilical hernia repair  . Tonsillectomy    . Back surgery  2004, 2015  . Eye surgery      L eye- as a child     FAMILY HISTORY: Family History  Problem Relation Age of Onset  . Gallstones Father   . Ulcers Father   . Alcohol abuse Father   . Diabetes Daughter   . Diabetes Brother   . Diabetes Sister     SOCIAL HISTORY:  reports that he has  been smoking Cigarettes.  He has a 50 pack-year smoking history. He has never used smokeless tobacco. He reports that he does not drink alcohol or use illicit drugs.  PERFORMANCE STATUS: The patient's performance status is 2 - Symptomatic, <50% confined to bed  PHYSICAL EXAM: Most Recent Vital Signs: Blood pressure 171/88, pulse 54, temperature 98 F (36.7 C), temperature source Oral, resp. rate 18, height 5' 11.25" (1.81 m), weight 330 lb 12.8 oz (150.05 kg), SpO2 99 %. General appearance: alert, cooperative, appears stated age, no distress, morbidly obese and accompanied by his wife Head: Normocephalic, without obvious abnormality, atraumatic Eyes: negative findings: lids and lashes normal, conjunctivae and sclerae normal and corneas clear Throat: normal findings: lips normal without lesions, buccal mucosa normal and oropharynx pink & moist without lesions or evidence of thrush Neck: no adenopathy and supple, symmetrical, trachea midline Lungs: clear to auscultation bilaterally Heart: regular rate and rhythm Abdomen: normal findings: no masses palpable and no organomegaly and abnormal findings:  obese Extremities: edema bilateral 2-3+ pitting pre-tibially and hand edema noted Skin: Skin color, texture, turgor normal. No rashes or lesions Lymph nodes: Cervical, supraclavicular, and axillary nodes normal. Neurologic: Grossly normal  LABORATORY DATA:  CBC    Component Value Date/Time   WBC 7.1 08/27/2015 0412   WBC 7.8 08/22/2015 1224   RBC 4.16* 08/27/2015 0412   RBC 4.06* 08/23/2015 0337   RBC 4.33* 08/22/2015 1224   HGB 11.5* 08/27/2015 0412   HGB 11.2* 08/22/2015 1224   HCT 35.9* 08/27/2015 0412   HCT 33.4* 08/23/2015 0337   HCT 35.9* 08/22/2015 1224   PLT 316 08/27/2015 0412   MCV 86.3 08/27/2015 0412   MCV 82.9 08/22/2015 1224   MCH 27.6 08/27/2015 0412   MCH 25.8* 08/22/2015 1224   MCHC 32.0 08/27/2015 0412   MCHC 31.1* 08/22/2015 1224   RDW 12.8 08/27/2015 0412    LYMPHSABS 2.2 03/11/2015 1425   MONOABS 0.5 03/11/2015 1425   EOSABS 0.1 03/11/2015 1425   BASOSABS 0.0 03/11/2015 1425      Chemistry      Component Value Date/Time   NA 141 08/27/2015 0412   K 3.5 08/27/2015 0412   CL 102 08/27/2015 0412   CO2 29 08/27/2015 0412   BUN 23* 08/27/2015 0412   CREATININE 1.41* 08/27/2015 0412   CREATININE 1.28* 08/22/2015 1201      Component Value Date/Time   CALCIUM 7.9* 08/27/2015 0412   ALKPHOS 80 08/22/2015 1346   AST 24 08/22/2015 1346   ALT 23 08/22/2015 1346   BILITOT 0.6 08/22/2015 1346     Lab Results  Component Value Date   PROT 5.4* 08/22/2015   ALBUMINELP 2.1* 08/24/2015   A1GS 0.2 08/24/2015  A2GS 0.9 08/24/2015   BETS 0.7 08/24/2015   GAMS 1.0 08/24/2015   MSPIKE 0.5* 08/24/2015   SPEI Comment 08/24/2015   SPECOM Comment 08/24/2015   IGGSERUM 992 08/27/2015   IGA 162 08/27/2015   IGMSERUM 46 08/27/2015   KPAFRELGTCHN 74.90* 08/24/2015   LAMBDASER 105.08* 08/24/2015   KAPLAMBRATIO 1.15* 08/24/2015   KAPLAMBRATIO 0.71 08/24/2015   No results found for: ESRSEDRATE, POCTSEDRATE  No results found for: CRP   RADIOGRAPHY: No results found.     PATHOLOGY:    Diagnosis Kidney, biopsy w/ EM l, Lower pole of right kidney KIDNEY, NEEDLE BIOPSY: MILD FOCAL SEGMENTAL GLOMERULOSCLEROSIS (SEE COMMENT) Microscopic Comment For complete report, please see Epic, [id number]. Diagnosis Note Diagnostic Evaluation conducted by J. Annye Rusk, MD at [facility], [place] # [id number] Enid Cutter MD Pathologist, Electronic Signature (Case signed 09/02/2015)   ASSESSMENT/PLAN:  Abnormal SPEP SPEP showing a small M-Spike of 0.5% that may represent a monoclonal protein with significant elevation of kappa and lambda light chains with a normal ratio and a UPEP showing 13867 mg/24hr of protein and M-Spike of 555 mg/24hr in the setting of a renal biopsy demonstrating mild focal segmental  glomerulosclerosis.  There are case reports of FSGS preceeding a myeloma diagnosis.  I have recommended proceeding with BMBX at this point. He is agreeable. Risks/benefits of procedure were addressed in detail. He was offered to do the procedure here vs. In [place]. He would like to go to [place] with IR.   I personally reviewed and went over laboratory results with the patient.  The results are noted within this dictation.  I personally reviewed and went over laboratory results with the patient.  The results are noted within this dictation.  CRAB issues reviewed: Calcium is not elevated Renal function WNL until recent hospitalization Anemia is nonexistent until recent hospitalization Bone lesion- no images available except for imaging of spine and abdomen that were not impressive.  The International Myeloma Working Group criteria for the diagnosis of MM emphasize the importance of end organ damage in making the diagnosis.  The diagnosis of MM requires the fulfillment of the following criterion: ?Clonal bone marrow plasma cells ?10 percent or biopsy-proven bony or soft tissue plasmacytoma - Clonality should be established by showing a kappa/lambda light-chain restriction on flow cytometry, immunohistochemistry, or immunofluorescence. Bone marrow plasma cell percentage should be estimated from a core biopsy specimen, when possible. If there is disparity between the aspirate and core biopsy, the highest value should be used. Approximately 4 percent of patients may have fewer than 10 percent bone marrow plasma cells since marrow involvement may be focal, rather than diffuse. Repeat bone marrow biopsy should be considered in such patients. PLUS one of the following: ?Presence of related organ or tissue impairment (often recalled by the acronym CRAB) - End organ damage is suggested by increased plasma calcium level, renal insufficiency, anemia, and bone lesions. In order to be included as diagnostic criteria,  changes in these factors must be felt to be related to the underlying plasma cell proliferative disorder. For these purposes, the following definitions are used:  .Anemia - Hemoglobin <10 g/dL (<100 g/L) or >2 g/dL (>20 g/L) below normal  .Hypercalcemia - Serum calcium >11 mg/dL (>2.75 mmol/liter). Consider other causes of hypercalcemia (eg, hyperparathyroidism).   .Renal insufficiency - Estimated or measured creatine clearance <40 mL/min or serum creatinine >2 mg/dL (177 mol/liter). Of these, creatinine clearance is the preferred measure of renal insufficiency because normal serum creatinine levels vary  by age, sex, and race. Using creatinine clearance ensures that a similar level of renal dysfunction is required to end organ damage.  .Bone lesions - One or more osteolytic lesions ?5 mm in size on skeletal radiography, MRI, CT, or PET/CT. In the absence of osteolytic lesions, the following are not sufficient markers of bone lesions: increased FDG uptake on PET, osteoporosis, or vertebral compression fracture. When a diagnosis is in doubt, biopsy of the bone lesion should be considered.   Will set patient up for skeletal survey.  Will set patient up for a bone marrow aspiration and biopsy.  Labs today: CBC diff, CMET, anemia panel, LDH, ESR, CRP, MM panel, and B2M.  UPEP with 24 hour protein.  Influenza vaccine today.  Return in 2-3 weeks for follow-up.  All questions were answered. The patient knows to call the clinic with any problems, questions or concerns. We can certainly see the patient much sooner if necessary.  This note is electronically signed by: Molli Hazard, MD  09/10/2015 1:16 PM

## 2015-09-10 ENCOUNTER — Encounter (HOSPITAL_COMMUNITY): Payer: Medicare HMO | Attending: Oncology | Admitting: Oncology

## 2015-09-10 ENCOUNTER — Encounter (HOSPITAL_COMMUNITY): Payer: Self-pay | Admitting: Oncology

## 2015-09-10 ENCOUNTER — Other Ambulatory Visit (HOSPITAL_COMMUNITY): Payer: Self-pay

## 2015-09-10 ENCOUNTER — Ambulatory Visit (HOSPITAL_COMMUNITY)
Admission: RE | Admit: 2015-09-10 | Discharge: 2015-09-10 | Disposition: A | Discharge status: Home / Self Care | Payer: Medicare HMO | Source: Ambulatory Visit | Attending: Oncology | Admitting: Oncology

## 2015-09-10 VITALS — BP 171/88 | HR 54 | Temp 98.0°F | Resp 18 | Ht 71.25 in | Wt 330.8 lb

## 2015-09-10 DIAGNOSIS — R778 Other specified abnormalities of plasma proteins: Secondary | ICD-10-CM

## 2015-09-10 DIAGNOSIS — E119 Type 2 diabetes mellitus without complications: Secondary | ICD-10-CM | POA: Diagnosis not present

## 2015-09-10 DIAGNOSIS — R779 Abnormality of plasma protein, unspecified: Secondary | ICD-10-CM | POA: Diagnosis not present

## 2015-09-10 DIAGNOSIS — R769 Abnormal immunological finding in serum, unspecified: Secondary | ICD-10-CM | POA: Diagnosis not present

## 2015-09-10 DIAGNOSIS — Z720 Tobacco use: Secondary | ICD-10-CM | POA: Diagnosis not present

## 2015-09-10 NOTE — Patient Instructions (Signed)
[place] at [facility] Discharge Instructions  RECOMMENDATIONS MADE BY THE CONSULTANT AND ANY TEST RESULTS WILL BE SENT TO YOUR REFERRING PHYSICIAN.  Exam and discussion by Robynn Pane, PA-C and Dr. Ancil Linsey. Will get you scheduled for bone marrow biopsy and aspiration - will be done in [place] and they will call you with the location, date and time of the appointment. Bone Survey - Do this on your way out today. 24 hour urine colledtion - start collection Sunday morning and bring in Monday morning when you come for lab work  Will see you back in 3 weeks and will discuss all test result then.  Thank you for choosing [place] at [facility] to provide your oncology and hematology care.  To afford each patient quality time with our provider, please arrive at least 15 minutes before your scheduled appointment time.    You need to re-schedule your appointment should you arrive 10 or more minutes late.  We strive to give you quality time with our providers, and arriving late affects you and other patients whose appointments are after yours.  Also, if you no show three or more times for appointments you may be dismissed from the clinic at the providers discretion.     Again, thank you for choosing [facility].  Our hope is that these requests will decrease the amount of time that you wait before being seen by our physicians.       _____________________________________________________________  Should you have questions after your visit to [facility], please contact our office at [phone] between the hours of 8:30 a.m. and 4:30 p.m.  Voicemails left after 4:30 p.m. will not be returned until the following business day.  For prescription refill requests, have your pharmacy contact our office.    24-Hour Urine Collection HOW DO I DO A 24-HOUR URINE COLLECTION?  When you get up in the morning, urinate in the  toilet and flush. Write down the time. This will be your start time on the day of collection and your end time on the next morning.  From then on, collect all of your urine in the plastic jug that is given to you.  Stop collecting your urine 24 hours after you started.  If the plastic jug that is given to you already has liquid in it, that is okay. Do not throw out the liquid or rinse out the jug. Some tests need the liquid to be added to your urine.  Keep your plastic jug cool in an ice chest or keep it in the refrigerator during the test.  When 24 hours are over, bring your plastic jug to the clinic lab. Keep the jug cool in an ice chest while you are bringing it to the lab.   This information is not intended to replace advice given to you by your health care provider. Make sure you discuss any questions you have with your health care provider.   Document Released: 01/26/2009 Document Revised: 11/20/2014 Document Reviewed: 03/25/2014 Elsevier Interactive Patient Education [employer].

## 2015-09-12 DIAGNOSIS — R769 Abnormal immunological finding in serum, unspecified: Secondary | ICD-10-CM | POA: Diagnosis not present

## 2015-09-13 ENCOUNTER — Encounter (HOSPITAL_COMMUNITY): Payer: Self-pay

## 2015-09-13 ENCOUNTER — Encounter (HOSPITAL_BASED_OUTPATIENT_CLINIC_OR_DEPARTMENT_OTHER): Payer: Medicare HMO

## 2015-09-13 VITALS — BP 169/94 | HR 57 | Temp 98.0°F | Resp 20

## 2015-09-13 DIAGNOSIS — Z23 Encounter for immunization: Secondary | ICD-10-CM | POA: Diagnosis not present

## 2015-09-13 DIAGNOSIS — R778 Other specified abnormalities of plasma proteins: Secondary | ICD-10-CM

## 2015-09-13 DIAGNOSIS — R769 Abnormal immunological finding in serum, unspecified: Secondary | ICD-10-CM

## 2015-09-13 LAB — CBC WITH DIFFERENTIAL/PLATELET
Basophils Absolute: 0 10*3/uL (ref 0.0–0.1)
Basophils Relative: 0 %
Eosinophils Absolute: 0.1 10*3/uL (ref 0.0–0.7)
Eosinophils Relative: 1 %
HCT: 36.8 % — ABNORMAL LOW (ref 39.0–52.0)
Hemoglobin: 11.7 g/dL — ABNORMAL LOW (ref 13.0–17.0)
Lymphocytes Relative: 9 %
Lymphs Abs: 0.9 10*3/uL (ref 0.7–4.0)
MCH: 27.5 pg (ref 26.0–34.0)
MCHC: 31.8 g/dL (ref 30.0–36.0)
MCV: 86.4 fL (ref 78.0–100.0)
Monocytes Absolute: 0.6 10*3/uL (ref 0.1–1.0)
Monocytes Relative: 6 %
Neutro Abs: 8.2 10*3/uL — ABNORMAL HIGH (ref 1.7–7.7)
Neutrophils Relative %: 84 %
Platelets: 227 10*3/uL (ref 150–400)
RBC: 4.26 MIL/uL (ref 4.22–5.81)
RDW: 13 % (ref 11.5–15.5)
WBC: 9.7 10*3/uL (ref 4.0–10.5)

## 2015-09-13 LAB — IRON AND TIBC
Iron: 65 ug/dL (ref 45–182)
Saturation Ratios: 39 % (ref 17.9–39.5)
TIBC: 168 ug/dL — ABNORMAL LOW (ref 250–450)
UIBC: 103 ug/dL

## 2015-09-13 LAB — COMPREHENSIVE METABOLIC PANEL
ALT: 18 U/L (ref 17–63)
AST: 22 U/L (ref 15–41)
Albumin: 1.8 g/dL — ABNORMAL LOW (ref 3.5–5.0)
Alkaline Phosphatase: 68 U/L (ref 38–126)
Anion gap: 5 (ref 5–15)
BUN: 38 mg/dL — ABNORMAL HIGH (ref 6–20)
CO2: 26 mmol/L (ref 22–32)
Calcium: 7.5 mg/dL — ABNORMAL LOW (ref 8.9–10.3)
Chloride: 112 mmol/L — ABNORMAL HIGH (ref 101–111)
Creatinine, Ser: 1.51 mg/dL — ABNORMAL HIGH (ref 0.61–1.24)
GFR calc Af Amer: 54 mL/min — ABNORMAL LOW (ref >60)
GFR calc non Af Amer: 46 mL/min — ABNORMAL LOW (ref >60)
Glucose, Bld: 194 mg/dL — ABNORMAL HIGH (ref 65–99)
Potassium: 3.6 mmol/L (ref 3.5–5.1)
Sodium: 143 mmol/L (ref 135–145)
Total Bilirubin: 0.6 mg/dL (ref 0.3–1.2)
Total Protein: 4.8 g/dL — ABNORMAL LOW (ref 6.5–8.1)

## 2015-09-13 LAB — PROTEIN, URINE, 24 HOUR
Collection Interval-UPROT: 24 hours
Protein, 24H Urine: 11536 mg/d — ABNORMAL HIGH (ref 50–100)
Protein, Urine: 721 mg/dL
Urine Total Volume-UPROT: 1600 mL

## 2015-09-13 LAB — FOLATE: Folate: 13.5 ng/mL (ref >5.9)

## 2015-09-13 LAB — LACTATE DEHYDROGENASE: LDH: 178 U/L (ref 98–192)

## 2015-09-13 LAB — C-REACTIVE PROTEIN: CRP: <0.5 mg/dL (ref <1.0)

## 2015-09-13 LAB — SEDIMENTATION RATE: Sed Rate: 50 mm/hr — ABNORMAL HIGH (ref 0–16)

## 2015-09-13 LAB — FERRITIN: Ferritin: 192 ng/mL (ref 24–336)

## 2015-09-13 LAB — VITAMIN B12: Vitamin B-12: 583 pg/mL (ref 180–914)

## 2015-09-13 MED ORDER — INFLUENZA VAC SPLIT QUAD 0.5 ML IM SUSY
0.5000 mL | PREFILLED_SYRINGE | Freq: Once | INTRAMUSCULAR | Status: AC
  Administered 2015-09-13: 0.5 mL via INTRAMUSCULAR

## 2015-09-13 MED ORDER — INFLUENZA VAC SPLIT QUAD 0.5 ML IM SUSY
PREFILLED_SYRINGE | INTRAMUSCULAR | Status: AC
  Filled 2015-09-13: qty 0.5

## 2015-09-13 NOTE — Progress Notes (Signed)
..  Gregory Diaz presents today for injection per the provider's orders.  Flu vaccine administration without incident; see MAR for injection details.  Patient tolerated procedure well and without incident.  No questions or complaints noted at this time.

## 2015-09-13 NOTE — Progress Notes (Signed)
Labs drawn

## 2015-09-14 LAB — BETA 2 MICROGLOBULIN, SERUM: Beta-2 Microglobulin: 3.1 mg/L — ABNORMAL HIGH (ref 0.6–2.4)

## 2015-09-14 LAB — KAPPA/LAMBDA LIGHT CHAINS
Kappa free light chain: 50.53 mg/L — ABNORMAL HIGH (ref 3.30–19.40)
Kappa, lambda light chain ratio: 0.7 (ref 0.26–1.65)
Lambda free light chains: 71.73 mg/L — ABNORMAL HIGH (ref 5.71–26.30)

## 2015-09-15 LAB — UIFE/LIGHT CHAINS/TP QN, 24-HR UR
% BETA, Urine: 7.8 %
ALPHA 1 URINE: 2.3 %
Albumin, U: 73.9 %
Alpha 2, Urine: 4.8 %
Free Kappa/Lambda Ratio: 1.47 — ABNORMAL LOW (ref 2.04–10.37)
Free Lambda Lt Chains,Ur: 63 mg/L — ABNORMAL HIGH (ref 0.24–6.66)
Free Lt Chn Excr Rate: 92.4 mg/L — ABNORMAL HIGH (ref 1.35–24.19)
GAMMA GLOBULIN URINE: 11.1 %
M-SPIKE %, Urine: 5.4 % — ABNORMAL HIGH
M-Spike, Mg/24 Hr: 643 mg/24 hr — ABNORMAL HIGH
Time: 24 hours
Total Protein, Urine-Ur/day: 11913.6 mg/24 hr — ABNORMAL HIGH (ref 30.0–150.0)
Total Protein, Urine: 744.6 mg/dL
Volume, Urine: 1600 mL

## 2015-09-16 ENCOUNTER — Other Ambulatory Visit: Payer: Self-pay | Admitting: Radiology

## 2015-09-17 ENCOUNTER — Ambulatory Visit (HOSPITAL_COMMUNITY)
Admission: RE | Admit: 2015-09-17 | Discharge: 2015-09-17 | Disposition: A | Discharge status: Home / Self Care | Payer: Medicare HMO | Source: Ambulatory Visit | Attending: Oncology | Admitting: Oncology

## 2015-09-17 ENCOUNTER — Encounter (HOSPITAL_COMMUNITY): Payer: Self-pay

## 2015-09-17 DIAGNOSIS — R600 Localized edema: Secondary | ICD-10-CM | POA: Insufficient documentation

## 2015-09-17 DIAGNOSIS — R769 Abnormal immunological finding in serum, unspecified: Secondary | ICD-10-CM | POA: Insufficient documentation

## 2015-09-17 DIAGNOSIS — R778 Other specified abnormalities of plasma proteins: Secondary | ICD-10-CM

## 2015-09-17 DIAGNOSIS — F17210 Nicotine dependence, cigarettes, uncomplicated: Secondary | ICD-10-CM | POA: Diagnosis not present

## 2015-09-17 DIAGNOSIS — D649 Anemia, unspecified: Secondary | ICD-10-CM | POA: Diagnosis not present

## 2015-09-17 DIAGNOSIS — Z880 Allergy status to penicillin: Secondary | ICD-10-CM | POA: Insufficient documentation

## 2015-09-17 DIAGNOSIS — M899 Disorder of bone, unspecified: Secondary | ICD-10-CM | POA: Diagnosis not present

## 2015-09-17 DIAGNOSIS — R69 Illness, unspecified: Secondary | ICD-10-CM | POA: Diagnosis not present

## 2015-09-17 DIAGNOSIS — N269 Renal sclerosis, unspecified: Secondary | ICD-10-CM | POA: Diagnosis not present

## 2015-09-17 DIAGNOSIS — Z79899 Other long term (current) drug therapy: Secondary | ICD-10-CM | POA: Diagnosis not present

## 2015-09-17 DIAGNOSIS — D72822 Plasmacytosis: Secondary | ICD-10-CM | POA: Insufficient documentation

## 2015-09-17 DIAGNOSIS — I10 Essential (primary) hypertension: Secondary | ICD-10-CM | POA: Diagnosis not present

## 2015-09-17 LAB — CBC
HCT: 38 % — ABNORMAL LOW (ref 39.0–52.0)
Hemoglobin: 12.1 g/dL — ABNORMAL LOW (ref 13.0–17.0)
MCH: 27.3 pg (ref 26.0–34.0)
MCHC: 31.8 g/dL (ref 30.0–36.0)
MCV: 85.8 fL (ref 78.0–100.0)
Platelets: 202 10*3/uL (ref 150–400)
RBC: 4.43 MIL/uL (ref 4.22–5.81)
RDW: 13 % (ref 11.5–15.5)
WBC: 9.6 10*3/uL (ref 4.0–10.5)

## 2015-09-17 LAB — PROTIME-INR
INR: 1.04 (ref 0.00–1.49)
Prothrombin Time: 13.8 seconds (ref 11.6–15.2)

## 2015-09-17 LAB — APTT: aPTT: 25 seconds (ref 24–37)

## 2015-09-17 LAB — GLUCOSE, CAPILLARY: Glucose-Capillary: 106 mg/dL — ABNORMAL HIGH (ref 65–99)

## 2015-09-17 MED ORDER — MIDAZOLAM HCL 2 MG/2ML IJ SOLN
INTRAMUSCULAR | Status: AC | PRN
  Administered 2015-09-17 (×4): 1 mg via INTRAVENOUS

## 2015-09-17 MED ORDER — HYDROCODONE-ACETAMINOPHEN 5-325 MG PO TABS
1.0000 | ORAL_TABLET | ORAL | Status: DC | PRN
  Filled 2015-09-17: qty 2

## 2015-09-17 MED ORDER — SODIUM CHLORIDE 0.9 % IV SOLN
INTRAVENOUS | Status: DC
  Administered 2015-09-17: 500 mL via INTRAVENOUS

## 2015-09-17 MED ORDER — FENTANYL CITRATE (PF) 100 MCG/2ML IJ SOLN
INTRAMUSCULAR | Status: AC | PRN
  Administered 2015-09-17 (×2): 25 ug via INTRAVENOUS
  Administered 2015-09-17: 50 ug via INTRAVENOUS

## 2015-09-17 MED ORDER — MIDAZOLAM HCL 2 MG/2ML IJ SOLN
INTRAMUSCULAR | Status: AC
  Filled 2015-09-17: qty 6

## 2015-09-17 MED ORDER — FENTANYL CITRATE (PF) 100 MCG/2ML IJ SOLN
INTRAMUSCULAR | Status: AC
  Filled 2015-09-17: qty 4

## 2015-09-17 NOTE — H&P (Signed)
Chief Complaint: Patient was seen in consultation today for M-spike on SPEP at the request of Kefalas,Jeryn S  Referring Physician(s): Kefalas,Cranford S  History of Present Illness: Gregory Diaz is a 64 y.o. male who is s/p renal biopsy that revealed mild focal segmental glomerulosclerosis and now with M spike in SPEP. The patient was seen by Robynn Pane PA-C and scheduled today for image guided bone marrow biopsy to rule out multiple myeloma. He does complain of bone pain in bilateral hips and both legs. He has increased lower extremity swelling since 08/21/15 and denies any chest pain or worsening shortness of breath. He is on Lasix and states compliance with this medication. He denies any chest pain, shortness of breath or palpitations. He denies any active signs of bleeding or excessive bruising. He denies any recent fever or chills. The patient does think he has OSA, but needs to have a sleep study still. He has previously tolerated sedation without complications.    Past Medical History  Diagnosis Date  . Hypertension   . Heart murmur     was told at age 31, but has not had any problems  . Enlarged prostate   . Anxiety   . Early cataracts, bilateral   . Arthritis     DDD, low back  . Abnormal SPEP 09/09/2015    Past Surgical History  Procedure Laterality Date  . Hemorroidectomy    . Fracture surgery Right     thumb  . Colonoscopy    . Hernia repair      umbilical hernia repair  . Tonsillectomy    . Back surgery  2004, 2015  . Eye surgery      L eye- as a child     Allergies: Penicillins  Medications: Prior to Admission medications   Medication Sig Start Date End Date Taking? Authorizing Provider  amLODipine (NORVASC) 10 MG tablet Take 1 tablet (10 mg total) by mouth daily. 08/27/15  Yes Orson Eva, MD  furosemide (LASIX) 80 MG tablet Take 1 tablet (80 mg total) by mouth 2 (two) times daily. 08/27/15  Yes Orson Eva, MD  lisinopril-hydrochlorothiazide  (PRINZIDE,ZESTORETIC) 20-12.5 MG tablet Take 1 tablet by mouth daily. 06/24/15  Yes Historical Provider, MD  omeprazole (PRILOSEC) 20 MG capsule Take 20 mg by mouth daily.   Yes Historical Provider, MD  oxyCODONE-acetaminophen (PERCOCET) 10-325 MG tablet Take 1 tablet by mouth every 4 (four) hours as needed for pain.   Yes Historical Provider, MD  predniSONE (DELTASONE) 20 MG tablet Take 20 mg by mouth daily with breakfast. Taking 3 daily   Yes Historical Provider, MD  senna (SENOKOT) 8.6 MG tablet Take 1 tablet by mouth as needed for constipation. Will take up to 3 if needed   Yes Historical Provider, MD  sitaGLIPtin (JANUVIA) 50 MG tablet Take 1 tablet (50 mg total) by mouth daily. 08/27/15  Yes Orson Eva, MD  ranitidine (ZANTAC) 150 MG capsule Take 150 mg by mouth 2 (two) times daily.    Historical Provider, MD     Family History  Problem Relation Age of Onset  . Gallstones Father   . Ulcers Father   . Alcohol abuse Father   . Diabetes Daughter   . Diabetes Brother   . Diabetes Sister     Social History   Social History  . Marital Status: Married    Spouse Name: N/A  . Number of Children: N/A  . Years of Education: N/A   Occupational History  .  retire     2014; truck Geophysicist/field seismologist   Social History Main Topics  . Smoking status: Current Every Day Smoker -- 1.00 packs/day for 50 years    Types: Cigarettes  . Smokeless tobacco: Never Used  . Alcohol Use: No  . Drug Use: No  . Sexual Activity: Yes    Birth Control/ Protection: None   Other Topics Concern  . None   Social History Narrative   Review of Systems: A 12 point ROS discussed and pertinent positives are indicated in the HPI above.  All other systems are negative.  Review of Systems  Vital Signs: BP 164/100 mmHg  Pulse 58  Temp(Src) 98 F (36.7 C) (Oral)  Resp 19  Ht $R'6\' 2"'BB$  (1.88 m)  Wt 328 lb (148.78 kg)  BMI 42.09 kg/m2  SpO2 100%  Physical Exam  Constitutional: He is oriented to person, place, and time. No  distress.  HENT:  Head: Normocephalic and atraumatic.  Cardiovascular: Normal rate and regular rhythm.  Exam reveals no gallop and no friction rub.   No murmur heard. Pulmonary/Chest: Effort normal and breath sounds normal. No respiratory distress. He has no wheezes. He has no rales.  Musculoskeletal:  3+ EDEMA B/L  Neurological: He is alert and oriented to person, place, and time.  Skin: Skin is warm and dry. He is not diaphoretic.    Mallampati Score:  MD Evaluation Airway: WNL Heart: WNL Abdomen: WNL Chest/ Lungs: WNL ASA  Classification: 2 Mallampati/Airway Score: Two  Imaging: Dg Chest 2 View  08/22/2015  CLINICAL DATA:  Shortness of breath, 5 days duration. EXAM: CHEST  2 VIEW COMPARISON:  06/09/2014 FINDINGS: The heart is enlarged. The aorta is unfolded. There is interstitial and early alveolar pulmonary edema with a small amount of fluid in the fissures. No large effusion. No significant bony finding. IMPRESSION: Congestive heart failure/early pulmonary edema. Electronically Signed   By: Nelson Chimes M.D.   On: 08/22/2015 13:17   US Renal  08/24/2015  CLINICAL DATA:  Acute kidney injury EXAM: RENAL / URINARY TRACT ULTRASOUND COMPLETE COMPARISON:  None. FINDINGS: Right Kidney: Length: 15.6 cm. Numerous cysts in the upper and midpole all measuring less than or equal to 5 cm. No hydronephrosis. Normal echogenicity. Left Kidney: Length: 15.1 cm. Two lower pole cysts 1 measuring 4.4 cm in the other measuring 3.6 cm. No hydronephrosis. Normal echogenicity. Bladder: Appears normal for degree of bladder distention. IMPRESSION: Bilateral simple cysts.  No acute abnormalities. Electronically Signed   By: Skipper Cliche M.D.   On: 08/24/2015 08:28   US Biopsy  08/26/2015  CLINICAL DATA:  Anasarca and nephrotic syndrome. The patient requires renal biopsy. EXAM: ULTRASOUND GUIDED CORE BIOPSY OF RIGHT KIDNEY MEDICATIONS: 1.0 mg IV Versed; 50 mcg IV Fentanyl Total Moderate Sedation Time: 14  minutes. PROCEDURE: The procedure, risks, benefits, and alternatives were explained to the patient. Questions regarding the procedure were encouraged and answered. The patient understands and consents to the procedure. A time-out was performed prior to the procedure. The right flank region was prepped with chlorhexidine in a sterile fashion, and a sterile drape was applied covering the operative field. A sterile gown and sterile gloves were used for the procedure. Local anesthesia was provided with 1% Lidocaine. Ultrasound was performed of both kidneys. The right was chosen for biopsy. Under ultrasound guidance, a 16 gauge core biopsy device was advanced into a lower pole cortex. Two separate 16 gauge core biopsy samples were obtained and submitted in saline additional ultrasound was  performed. COMPLICATIONS: None. FINDINGS: The right kidney was better visualized by ultrasound in the prone position. There were several cysts emanating from the lower pole of the left kidney. Solid tissue was obtained from the right kidney. IMPRESSION: Ultrasound-guided core biopsy performed of the right kidney at the level of lower pole cortex. Electronically Signed   By: Aletta Edouard M.D.   On: 08/26/2015 18:20   Dg Bone Survey Met  09/10/2015  CLINICAL DATA:  Abnormal serum protein electrophoresis. EXAM: METASTATIC BONE SURVEY COMPARISON:  None. FINDINGS: Severe degenerative disc disease is noted at C6-7 and C7-T1. Multilevel degenerative disc disease is noted in the thoracic spine. Status post surgical posterior fusion of L2, L3, L4 and L5. No lytic lesions are noted involving the visualize ribs, skull, pelvis, spine or extremities. IMPRESSION: No definite lytic lesions are noted in the visualized skeleton. Electronically Signed   By: Marijo Conception, M.D.   On: 09/10/2015 15:59    Labs:  CBC:  Recent Labs  08/26/15 1709 08/27/15 0412 09/13/15 1005 09/17/15 0702  WBC 8.1 7.1 9.7 9.6  HGB 11.1* 11.5* 11.7* 12.1*   HCT 35.0* 35.9* 36.8* 38.0*  PLT 323 316 227 202    COAGS:  Recent Labs  08/25/15 0410 09/17/15 0702  INR 1.23 1.04  APTT 32 25    BMP:  Recent Labs  08/25/15 0410 08/26/15 0458 08/27/15 0412 09/13/15 1005  NA 142 138 141 143  K 3.6 3.6 3.5 3.6  CL 101 101 102 112*  CO2 _0 GLUCOSE 98 103* 95 194*  BUN 25* 23* 23* 38*  CALCIUM 8.1* 7.7* 7.9* 7.5*  CREATININE 1.46* 1.30* 1.41* 1.51*  GFRNONAA 48* 56* 50* 46*  GFRAA 56* >60 58* 54*    LIVER FUNCTION TESTS:  Recent Labs  03/11/15 1425 08/22/15 1201 08/22/15 1346 08/26/15 0458 09/13/15 1005  BILITOT 0.8 0.5 0.6  --  0.6  AST _1 --  22  ALT _2 --  18  ALKPHOS 64 81 80  --  68  PROT 6.5 5.4* 5.4*  --  4.8*  ALBUMIN 3.5 2.4* 2.1* 1.9* 1.8*    Assessment and Plan: S/p renal biopsy revealing mild focal segmental glomerulosclerosis  M spike in SPEP Seen by Robynn Pane PA-C Scheduled today for image guided bone marrow biopsy with sedation The patient has been NPO, no blood thinners taken, labs and vitals have been reviewed. Risks and Benefits discussed with the patient including, but not limited to bleeding, infection, damage to adjacent structures or low yield requiring additional tests. All of the patient's questions were answered, patient is agreeable to proceed. Consent signed and in chart. Lower extremity edema, compliant with Lasix   Thank you for this interesting consult.  I greatly enjoyed meeting Gregory Diaz and look forward to participating in their care.  A copy of this report was sent to the requesting provider on this date.  SignedHedy Jacob 09/17/2015, 8:42 AM   I spent a total of 15 Minutes in face to face in clinical consultation, greater than 50% of which was counseling/coordinating care for M spike, abnormal SPEP.

## 2015-09-17 NOTE — Procedures (Signed)
CT guided bone marrow biopsy without complication.   Minimal blood loss.

## 2015-09-17 NOTE — Discharge Instructions (Signed)
Bone Marrow Aspiration and Bone Marrow Biopsy, Care After Refer to this sheet in the next few weeks. These instructions provide you with information about caring for yourself after your procedure. Your health care provider may also give you more specific instructions. Your treatment has been planned according to current medical practices, but problems sometimes occur. Call your health care provider if you have any problems or questions after your procedure. WHAT TO EXPECT AFTER THE PROCEDURE After your procedure, it is common to have:  Soreness or tenderness around the puncture site.  Bruising. HOME CARE INSTRUCTIONS  Take medicines only as directed by your health care provider.  Follow your health care provider's instructions about:  Puncture site care.  Bandage (dressing) changes and removal.  Bathe and shower as directed by your health care provider.  Check your puncture site every day for signs of infection. Watch for:  Redness, swelling, or pain.  Fluid, blood, or pus.  Return to your normal activities as directed by your health care provider.  Keep all follow-up visits as directed by your health care provider. This is important. SEEK MEDICAL CARE IF:  You have a fever.  You have uncontrollable bleeding.  You have redness, swelling, or pain at the site of your puncture.  You have fluid, blood, or pus coming from your puncture site.   This information is not intended to replace advice given to you by your health care provider. Make sure you discuss any questions you have with your health care provider.   Document Released: 05/19/2005 Document Revised: 03/16/2015 Document Reviewed: 10/21/2014 Elsevier Interactive Patient Education 2016 [employer]. Bone Marrow Aspiration and Bone Marrow Biopsy Bone marrow aspiration and bone marrow biopsy are procedures that are done to diagnose blood disorders. You may also have one of these procedures to help diagnose infections or  some types of cancer. Bone marrow is the soft tissue that is inside your bones. Blood cells are produced in bone marrow. For bone marrow aspiration, a sample of tissue in liquid form is removed from inside your bone. For a bone marrow biopsy, a small core of bone marrow tissue is removed. Then these samples are examined under a microscope or tested in a lab. You may need these procedures if you have an abnormal complete blood count (CBC). The aspiration or biopsy sample is usually taken from the top of your hip bone. Sometimes, an aspiration sample is taken from your chest bone (sternum). LET [facility] CARE PROVIDER KNOW ABOUT:  Any allergies you have.  All medicines you are taking, including vitamins, herbs, eye drops, creams, and over-the-counter medicines.  Previous problems you or members of your family have had with the use of anesthetics.  Any blood disorders you have.  Previous surgeries you have had.  Any medical conditions you may have.  Whether you are pregnant or you think that you may be pregnant. RISKS AND COMPLICATIONS Generally, this is a safe procedure. However, problems may occur, including:  Infection.  Bleeding. BEFORE THE PROCEDURE  Ask your health care provider about:  Changing or stopping your regular medicines. This is especially important if you are taking diabetes medicines or blood thinners.  Taking medicines such as aspirin and ibuprofen. These medicines can thin your blood. Do not take these medicines before your procedure if your health care provider instructs you not to.  Plan to have someone take you home after the procedure.  If you go home right after the procedure, plan to have someone with you  for 24 hours. PROCEDURE   An IV tube may be inserted into one of your veins.  The injection site will be cleaned with a germ-killing solution (antiseptic).  You will be given one or more of the following:  A medicine that helps you relax  (sedative).  A medicine that numbs the area (local anesthetic).  The bone marrow sample will be removed as follows:  For an aspiration, a hollow needle will be inserted through your skin and into your bone. Bone marrow fluid will be drawn up into a syringe.  For a biopsy, your health care provider will use a hollow needle to remove a core of tissue from your bone marrow.  The needle will be removed.  A bandage (dressing) will be placed over the insertion site and taped in place. The procedure may vary among health care providers and hospitals. AFTER THE PROCEDURE  Your blood pressure, heart rate, breathing rate, and blood oxygen level will be monitored often until the medicines you were given have worn off.  Return to your normal activities as directed by your health care provider.   This information is not intended to replace advice given to you by your health care provider. Make sure you discuss any questions you have with your health care provider.   Document Released: 11/02/2004 Document Revised: 03/16/2015 Document Reviewed: 10/21/2014 Elsevier Interactive Patient Education 2016 [employer]. Moderate Conscious Sedation, Adult Sedation is the use of medicines to promote relaxation and relieve discomfort and anxiety. Moderate conscious sedation is a type of sedation. Under moderate conscious sedation you are less alert than normal but are still able to respond to instructions or stimulation. Moderate conscious sedation is used during short medical and dental procedures. It is milder than deep sedation or general anesthesia and allows you to return to your regular activities sooner. LET [facility] CARE PROVIDER KNOW ABOUT:   Any allergies you have.  All medicines you are taking, including vitamins, herbs, eye drops, creams, and over-the-counter medicines.  Use of steroids (by mouth or creams).  Previous problems you or members of your family have had with the use of  anesthetics.  Any blood disorders you have.  Previous surgeries you have had.  Medical conditions you have.  Possibility of pregnancy, if this applies.  Use of cigarettes, alcohol, or illegal drugs. RISKS AND COMPLICATIONS Generally, this is a safe procedure. However, as with any procedure, problems can occur. Possible problems include:  Oversedation.  Trouble breathing on your own. You may need to have a breathing tube until you are awake and breathing on your own.  Allergic reaction to any of the medicines used for the procedure. BEFORE THE PROCEDURE  You may have blood tests done. These tests can help show how well your kidneys and liver are working. They can also show how well your blood clots.  A physical exam will be done.  Only take medicines as directed by your health care provider. You may need to stop taking medicines (such as blood thinners, aspirin, or nonsteroidal anti-inflammatory drugs) before the procedure.   Do not eat or drink at least 6 hours before the procedure or as directed by your health care provider.  Arrange for a responsible adult, family member, or friend to take you home after the procedure. He or she should stay with you for at least 24 hours after the procedure, until the medicine has worn off. PROCEDURE   An intravenous (IV) catheter will be inserted into one of your  veins. Medicine will be able to flow directly into your body through this catheter. You may be given medicine through this tube to help prevent pain and help you relax.  The medical or dental procedure will be done. AFTER THE PROCEDURE  You will stay in a recovery area until the medicine has worn off. Your blood pressure and pulse will be checked.   Depending on the procedure you had, you may be allowed to go home when you can tolerate liquids and your pain is under control.   This information is not intended to replace advice given to you by your health care provider. Make  sure you discuss any questions you have with your health care provider.   Document Released: 07/25/2001 Document Revised: 11/20/2014 Document Reviewed: 07/07/2013 Elsevier Interactive Patient Education 2016 Elsevier Inc. Moderate Conscious Sedation, Adult, Care After Refer to this sheet in the next few weeks. These instructions provide you with information on caring for yourself after your procedure. Your health care provider may also give you more specific instructions. Your treatment has been planned according to current medical practices, but problems sometimes occur. Call your health care provider if you have any problems or questions after your procedure. WHAT TO EXPECT AFTER THE PROCEDURE  After your procedure:  You may feel sleepy, clumsy, and have poor balance for several hours.  Vomiting may occur if you eat too soon after the procedure. HOME CARE INSTRUCTIONS  Do not participate in any activities where you could become injured for at least 24 hours. Do not:  Drive.  Swim.  Ride a bicycle.  Operate heavy machinery.  Cook.  Use power tools.  Climb ladders.  Work from a high place.  Do not make important decisions or sign legal documents until you are improved.  If you vomit, drink water, juice, or soup when you can drink without vomiting. Make sure you have little or no nausea before eating solid foods.  Only take over-the-counter or prescription medicines for pain, discomfort, or fever as directed by your health care provider.  Make sure you and your family fully understand everything about the medicines given to you, including what side effects may occur.  You should not drink alcohol, take sleeping pills, or take medicines that cause drowsiness for at least 24 hours.  If you smoke, do not smoke without supervision.  If you are feeling better, you may resume normal activities 24 hours after you were sedated.  Keep all appointments with your health care  provider. SEEK MEDICAL CARE IF:  Your skin is pale or bluish in color.  You continue to feel nauseous or vomit.  Your pain is getting worse and is not helped by medicine.  You have bleeding or swelling.  You are still sleepy or feeling clumsy after 24 hours. SEEK IMMEDIATE MEDICAL CARE IF:  You develop a rash.  You have difficulty breathing.  You develop any type of allergic problem.  You have a fever. MAKE SURE YOU:  Understand these instructions.  Will watch your condition.  Will get help right away if you are not doing well or get worse.   This information is not intended to replace advice given to you by your health care provider. Make sure you discuss any questions you have with your health care provider.   Document Released: 08/20/2013 Document Revised: 11/20/2014 Document Reviewed: 08/20/2013 Elsevier Interactive Patient Education [employer].

## 2015-09-22 LAB — BONE MARROW EXAM

## 2015-09-27 DIAGNOSIS — R609 Edema, unspecified: Secondary | ICD-10-CM | POA: Diagnosis not present

## 2015-09-27 DIAGNOSIS — N041 Nephrotic syndrome with focal and segmental glomerular lesions: Secondary | ICD-10-CM | POA: Diagnosis not present

## 2015-09-27 DIAGNOSIS — Z6841 Body Mass Index (BMI) 40.0 and over, adult: Secondary | ICD-10-CM | POA: Diagnosis not present

## 2015-09-27 DIAGNOSIS — Z1389 Encounter for screening for other disorder: Secondary | ICD-10-CM | POA: Diagnosis not present

## 2015-09-27 LAB — CHROMOSOME ANALYSIS, BONE MARROW

## 2015-10-01 ENCOUNTER — Encounter (HOSPITAL_COMMUNITY): Payer: Medicare HMO | Attending: Oncology | Admitting: Hematology & Oncology

## 2015-10-01 ENCOUNTER — Encounter (HOSPITAL_COMMUNITY): Payer: Self-pay

## 2015-10-01 ENCOUNTER — Encounter (HOSPITAL_COMMUNITY): Payer: Medicare HMO

## 2015-10-01 VITALS — BP 133/97 | HR 60 | Temp 97.7°F | Resp 20 | Wt 303.8 lb

## 2015-10-01 DIAGNOSIS — R769 Abnormal immunological finding in serum, unspecified: Secondary | ICD-10-CM | POA: Diagnosis not present

## 2015-10-01 DIAGNOSIS — N049 Nephrotic syndrome with unspecified morphologic changes: Secondary | ICD-10-CM | POA: Diagnosis not present

## 2015-10-01 DIAGNOSIS — D472 Monoclonal gammopathy: Secondary | ICD-10-CM

## 2015-10-01 DIAGNOSIS — R778 Other specified abnormalities of plasma proteins: Secondary | ICD-10-CM

## 2015-10-01 NOTE — Progress Notes (Signed)
[facility]ology/Oncology Consultation   Name: Gregory Diaz      MRN: [id number]     Date: 10/01/2015 Time:1:08 PM   REFERRING PHYSICIAN:  Orson Eva, MD (Hospitalist)  REASON FOR CONSULT:  MGUS BMBX with polyclonal plasmacytosis, normal cytogenetics 08/24/2015 SPEP showing a small M-Spike of 0.5% that may represent a monoclonal protein with significant elevation of kappa and lambda light chains with a normal ratio   UPEP showing 13867 mg/24hr of protein and M-Spike of 555 mg/24hr in the setting of a renal biopsy demonstrating mild focal segmental glomerulosclerosis.  HISTORY OF PRESENT ILLNESS:   Gregory Diaz is a 64 year old black American man with a past medical history significant for nephrotic syndrome, HTN, diastolic CHL, and obesity who is referred to [facility] for abnormal SPEP.  Had a bone survey on 10/28. He is felt to have MGUS,   His left foot is very swollen with fluid accumulation. He states "it was tight as leather." He remarks that his right foot doesn't have as much fluid in it.  He states that when he gets up in the morning, he struggles to get up. He can't move his legs until he stands up and lets the blood reach his legs. "I can literally feel the blood running to those areas of my body, but I can't get up and get going until I feel the blood run there." He states he can't get up until he stands for a while and steadies himself against the wall and feels the blood go into his legs.  His appetite is excellent. He sleeps well. He continues to have chronic back pain.   PAST MEDICAL HISTORY:   Past Medical History  Diagnosis Date  . Hypertension   . Heart murmur     was told at age 1, but has not had any problems  . Enlarged prostate   . Anxiety   . Early cataracts, bilateral   . Arthritis     DDD, low back  . Abnormal SPEP 09/09/2015    ALLERGIES: Allergies  Allergen Reactions  . Penicillins Swelling    Has patient had a PCN  reaction causing immediate rash, facial/tongue/throat swelling, SOB or lightheadedness with hypotension: Yes Has patient had a PCN reaction causing severe rash involving mucus membranes or skin necrosis: No Has patient had a PCN reaction that required hospitalization No Has patient had a PCN reaction occurring within the last 10 years: No If all of the above answers are "NO", then may proceed with Cephalosporin use.       MEDICATIONS: I have reviewed the patient's current medications.    Current Outpatient Prescriptions on File Prior to Visit  Medication Sig Dispense Refill  . furosemide (LASIX) 80 MG tablet Take 1 tablet (80 mg total) by mouth 2 (two) times daily. (Patient taking differently: Take 80 mg by mouth 3 (three) times daily. ) 60 tablet 1  . omeprazole (PRILOSEC) 20 MG capsule Take 20 mg by mouth daily.    . predniSONE (DELTASONE) 20 MG tablet Take 20 mg by mouth daily with breakfast. Taking 3 daily    . senna (SENOKOT) 8.6 MG tablet Take 1 tablet by mouth as needed for constipation. Will take up to 3 if needed    . sitaGLIPtin (JANUVIA) 50 MG tablet Take 1 tablet (50 mg total) by mouth daily. 30 tablet 1  . oxyCODONE-acetaminophen (PERCOCET) 10-325 MG tablet Take 1 tablet by mouth every 4 (four)  hours as needed for pain.    . ranitidine (ZANTAC) 150 MG capsule Take 150 mg by mouth 2 (two) times daily.     No current facility-administered medications on file prior to visit.     PAST SURGICAL HISTORY Past Surgical History  Procedure Laterality Date  . Hemorroidectomy    . Fracture surgery Right     thumb  . Colonoscopy    . Hernia repair      umbilical hernia repair  . Tonsillectomy    . Back surgery  2004, 2015  . Eye surgery      L eye- as a child     FAMILY HISTORY: Family History  Problem Relation Age of Onset  . Gallstones Father   . Ulcers Father   . Alcohol abuse Father   . Diabetes Daughter   . Diabetes Brother   . Diabetes Sister    Review of  Systems  14 point review of systems was performed and is negative except as detailed under history of present illness and above  SOCIAL HISTORY:  reports that he has been smoking Cigarettes.  He has a 50 pack-year smoking history. He has never used smokeless tobacco. He reports that he does not drink alcohol or use illicit drugs.  PERFORMANCE STATUS: The patient's performance status is 2 - Symptomatic, <50% confined to bed  PHYSICAL EXAM: Most Recent Vital Signs: Blood pressure 133/97, pulse 60, temperature 97.7 F (36.5 C), temperature source Oral, resp. rate 20, weight 303 lb 12.8 oz (137.803 kg), SpO2 100 %. General appearance: alert, cooperative, appears stated age, no distress, morbidly obese and accompanied by his wife Head: Normocephalic, without obvious abnormality, atraumatic Eyes: negative findings: lids and lashes normal, conjunctivae and sclerae normal and corneas clear Throat: normal findings: lips normal without lesions, buccal mucosa normal and oropharynx pink & moist without lesions or evidence of thrush Neck: no adenopathy and supple, symmetrical, trachea midline Lungs: clear to auscultation bilaterally Heart: regular rate and rhythm Abdomen: normal findings: no masses palpable and no organomegaly and abnormal findings:  obese Extremities: edema bilateral 2-3+ pitting pre-tibially and hand edema noted Skin: Skin color, texture, turgor normal. No rashes or lesions Lymph nodes: Cervical, supraclavicular, and axillary nodes normal. Neurologic: Grossly normal  LABORATORY DATA:  I have reviewed the data as listed Results for Gregory Diaz, Gregory Diaz (MRN [id number])   Ref. Range 09/17/2015 07:02  WBC Latest Ref Range: 4.0-10.5 K/uL 9.6  RBC Latest Ref Range: 4.22-5.81 MIL/uL 4.43  Hemoglobin Latest Ref Range: 13.0-17.0 g/dL 12.1 (L)  HCT Latest Ref Range: 39.0-52.0 % 38.0 (L)  MCV Latest Ref Range: 78.0-100.0 fL 85.8  MCH Latest Ref Range: 26.0-34.0 pg 27.3  MCHC Latest Ref  Range: 30.0-36.0 g/dL 31.8  RDW Latest Ref Range: 11.5-15.5 % 13.0  Platelets Latest Ref Range: 150-400 K/uL 202  Prothrombin Time Latest Ref Range: 11.6-15.2 seconds 13.8  INR Latest Ref Range: 0.00-1.49  1.04  APTT Latest Ref Range: 24-37 seconds 25   RADIOGRAPHY:   CLINICAL DATA: Abnormal serum protein electrophoresis.  EXAM: METASTATIC BONE SURVEY  COMPARISON: None.  FINDINGS: Severe degenerative disc disease is noted at C6-7 and C7-T1. Multilevel degenerative disc disease is noted in the thoracic spine. Status post surgical posterior fusion of L2, L3, L4 and L5. No lytic lesions are noted involving the visualize ribs, skull, pelvis, spine or extremities.  IMPRESSION: No definite lytic lesions are noted in the visualized skeleton.   Electronically Signed  By: Marijo Conception, M.D.  On: 09/10/2015  15:59    PATHOLOGY:    Diagnosis Kidney, biopsy w/ EM l, Lower pole of right kidney KIDNEY, NEEDLE BIOPSY: MILD FOCAL SEGMENTAL GLOMERULOSCLEROSIS (SEE COMMENT) Microscopic Comment For complete report, please see Epic, [id number]. Diagnosis Note Diagnostic Evaluation conducted by J. Annye Rusk, MD at [facility], [place] # [id number] Enid Cutter MD Pathologist, Electronic Signature (Case signed 09/02/2015)       ASSESSMENT/PLAN:  MGUS BMBX with polyclonal plasmacytosis, normal cytogenetics 08/24/2015 SPEP showing a small M-Spike of 0.5% that may represent a monoclonal protein with significant elevation of kappa and lambda light chains with a normal ratio   UPEP showing 13867 mg/24hr of protein and M-Spike of 555 mg/24hr in the setting of a renal biopsy demonstrating mild focal segmental glomerulosclerosis.   His blood counts have been good. At present, he has MGUS.  I provided the patient and his wife with reading material.  I reviewed his BMBX in detail.  I discussed ongoing follow-up and would like to see him back in 3 months.  He will have repeat bloodwork the week prior to his appointment.   Orders Placed This Encounter  Procedures  . Kappa/lambda light chains    Standing Status: Future     Number of Occurrences:      Standing Expiration Date: 09/30/2016  . IgG, IgA, IgM    Standing Status: Future     Number of Occurrences:      Standing Expiration Date: 09/30/2016  . Immunofixation electrophoresis    Standing Status: Future     Number of Occurrences:      Standing Expiration Date: 09/30/2016  . Protein electrophoresis, serum    Standing Status: Future     Number of Occurrences:      Standing Expiration Date: 09/30/2016  . CBC with Differential    Standing Status: Future     Number of Occurrences:      Standing Expiration Date: 09/30/2016  . Comprehensive metabolic panel    Standing Status: Future     Number of Occurrences:      Standing Expiration Date: 09/30/2016    All questions were answered. The patient knows to call the clinic with any problems, questions or concerns. We can certainly see the patient much sooner if necessary.  This document serves as a record of services personally performed by Ancil Linsey, MD. It was created on her behalf by Toni Amend, a trained medical scribe. The creation of this record is based on the scribe's personal observations and the provider's statements to them. This document has been checked and approved by the attending provider.  I have reviewed the above documentation for accuracy and completeness, and I agree with the above.  This note is electronically signed by: Molli Hazard, MD  10/01/2015 1:08 PM

## 2015-10-01 NOTE — Patient Instructions (Signed)
[place] at [facility] Discharge Instructions  RECOMMENDATIONS MADE BY THE CONSULTANT AND ANY TEST RESULTS WILL BE SENT TO YOUR REFERRING PHYSICIAN.  Exam and discussion by Dr. Whitney Muse Information of MGUS provided Call with any concerns.  Labs and office visit in 3 - 4 months. (will check labs 1 week prior to appointment)  Thank you for choosing [place] at [facility] to provide your oncology and hematology care.  To afford each patient quality time with our provider, please arrive at least 15 minutes before your scheduled appointment time.    You need to re-schedule your appointment should you arrive 10 or more minutes late.  We strive to give you quality time with our providers, and arriving late affects you and other patients whose appointments are after yours.  Also, if you no show three or more times for appointments you may be dismissed from the clinic at the providers discretion.     Again, thank you for choosing [facility].  Our hope is that these requests will decrease the amount of time that you wait before being seen by our physicians.       _____________________________________________________________  Should you have questions after your visit to [facility], please contact our office at [phone] between the hours of 8:30 a.m. and 4:30 p.m.  Voicemails left after 4:30 p.m. will not be returned until the following business day.  For prescription refill requests, have your pharmacy contact our office.

## 2015-10-04 DIAGNOSIS — N182 Chronic kidney disease, stage 2 (mild): Secondary | ICD-10-CM | POA: Diagnosis not present

## 2015-10-04 DIAGNOSIS — R809 Proteinuria, unspecified: Secondary | ICD-10-CM | POA: Diagnosis not present

## 2015-10-04 DIAGNOSIS — Z0001 Encounter for general adult medical examination with abnormal findings: Secondary | ICD-10-CM | POA: Diagnosis not present

## 2015-10-04 LAB — MULTIPLE MYELOMA PANEL, SERUM
Albumin SerPl Elph-Mcnc: 2.1 g/dL — ABNORMAL LOW (ref 2.9–4.4)
Albumin/Glob SerPl: 0.8 (ref 0.7–1.7)
Alpha 1: 0.2 g/dL (ref 0.0–0.4)
Alpha2 Glob SerPl Elph-Mcnc: 1.1 g/dL — ABNORMAL HIGH (ref 0.4–1.0)
B-Globulin SerPl Elph-Mcnc: 0.9 g/dL (ref 0.7–1.3)
Gamma Glob SerPl Elph-Mcnc: 0.7 g/dL (ref 0.4–1.8)
Globulin, Total: 3 g/dL (ref 2.2–3.9)
IgA: 178 mg/dL (ref 61–437)
IgG (Immunoglobin G), Serum: 760 mg/dL (ref 700–1600)
IgM, Serum: 62 mg/dL (ref 20–172)
M Protein SerPl Elph-Mcnc: 0.4 g/dL — ABNORMAL HIGH
Total Protein ELP: 5.1 g/dL — ABNORMAL LOW (ref 6.0–8.5)

## 2015-10-04 NOTE — Progress Notes (Signed)
LABS REDRAWN FOR MM

## 2015-10-12 DIAGNOSIS — M5136 Other intervertebral disc degeneration, lumbar region: Secondary | ICD-10-CM | POA: Diagnosis not present

## 2015-10-12 DIAGNOSIS — M5137 Other intervertebral disc degeneration, lumbosacral region: Secondary | ICD-10-CM | POA: Diagnosis not present

## 2015-10-13 DIAGNOSIS — Z1389 Encounter for screening for other disorder: Secondary | ICD-10-CM | POA: Diagnosis not present

## 2015-10-13 DIAGNOSIS — D472 Monoclonal gammopathy: Secondary | ICD-10-CM | POA: Diagnosis not present

## 2015-10-13 DIAGNOSIS — Z6837 Body mass index (BMI) 37.0-37.9, adult: Secondary | ICD-10-CM | POA: Diagnosis not present

## 2015-10-22 DIAGNOSIS — Z720 Tobacco use: Secondary | ICD-10-CM | POA: Diagnosis not present

## 2015-10-22 DIAGNOSIS — N182 Chronic kidney disease, stage 2 (mild): Secondary | ICD-10-CM | POA: Diagnosis not present

## 2015-10-22 DIAGNOSIS — D509 Iron deficiency anemia, unspecified: Secondary | ICD-10-CM | POA: Diagnosis not present

## 2015-10-22 DIAGNOSIS — N041 Nephrotic syndrome with focal and segmental glomerular lesions: Secondary | ICD-10-CM | POA: Diagnosis not present

## 2015-10-22 DIAGNOSIS — D472 Monoclonal gammopathy: Secondary | ICD-10-CM | POA: Diagnosis not present

## 2015-10-22 DIAGNOSIS — E1122 Type 2 diabetes mellitus with diabetic chronic kidney disease: Secondary | ICD-10-CM | POA: Diagnosis not present

## 2015-10-22 DIAGNOSIS — I10 Essential (primary) hypertension: Secondary | ICD-10-CM | POA: Diagnosis not present

## 2015-10-22 DIAGNOSIS — E669 Obesity, unspecified: Secondary | ICD-10-CM | POA: Diagnosis not present

## 2015-11-03 DIAGNOSIS — N182 Chronic kidney disease, stage 2 (mild): Secondary | ICD-10-CM | POA: Diagnosis not present

## 2015-11-03 DIAGNOSIS — R809 Proteinuria, unspecified: Secondary | ICD-10-CM | POA: Diagnosis not present

## 2015-11-08 ENCOUNTER — Encounter (HOSPITAL_COMMUNITY): Payer: Self-pay | Admitting: Hematology & Oncology

## 2015-11-15 DIAGNOSIS — E1165 Type 2 diabetes mellitus with hyperglycemia: Secondary | ICD-10-CM | POA: Diagnosis not present

## 2015-11-15 DIAGNOSIS — Z6838 Body mass index (BMI) 38.0-38.9, adult: Secondary | ICD-10-CM | POA: Diagnosis not present

## 2015-11-15 DIAGNOSIS — Z1389 Encounter for screening for other disorder: Secondary | ICD-10-CM | POA: Diagnosis not present

## 2015-11-18 ENCOUNTER — Encounter: Payer: Self-pay | Admitting: Hematology & Oncology

## 2015-11-24 DIAGNOSIS — M5136 Other intervertebral disc degeneration, lumbar region: Secondary | ICD-10-CM | POA: Diagnosis not present

## 2015-11-24 DIAGNOSIS — Z6838 Body mass index (BMI) 38.0-38.9, adult: Secondary | ICD-10-CM | POA: Diagnosis not present

## 2015-11-24 DIAGNOSIS — Z1389 Encounter for screening for other disorder: Secondary | ICD-10-CM | POA: Diagnosis not present

## 2015-11-24 DIAGNOSIS — M5410 Radiculopathy, site unspecified: Secondary | ICD-10-CM | POA: Diagnosis not present

## 2015-12-02 DIAGNOSIS — R809 Proteinuria, unspecified: Secondary | ICD-10-CM | POA: Diagnosis not present

## 2015-12-02 DIAGNOSIS — N182 Chronic kidney disease, stage 2 (mild): Secondary | ICD-10-CM | POA: Diagnosis not present

## 2015-12-16 DIAGNOSIS — E1122 Type 2 diabetes mellitus with diabetic chronic kidney disease: Secondary | ICD-10-CM | POA: Diagnosis not present

## 2015-12-16 DIAGNOSIS — N182 Chronic kidney disease, stage 2 (mild): Secondary | ICD-10-CM | POA: Diagnosis not present

## 2015-12-16 DIAGNOSIS — N041 Nephrotic syndrome with focal and segmental glomerular lesions: Secondary | ICD-10-CM | POA: Diagnosis not present

## 2015-12-16 DIAGNOSIS — R809 Proteinuria, unspecified: Secondary | ICD-10-CM | POA: Diagnosis not present

## 2015-12-16 DIAGNOSIS — D472 Monoclonal gammopathy: Secondary | ICD-10-CM | POA: Diagnosis not present

## 2015-12-16 DIAGNOSIS — E669 Obesity, unspecified: Secondary | ICD-10-CM | POA: Diagnosis not present

## 2015-12-16 DIAGNOSIS — D509 Iron deficiency anemia, unspecified: Secondary | ICD-10-CM | POA: Diagnosis not present

## 2015-12-16 DIAGNOSIS — Z720 Tobacco use: Secondary | ICD-10-CM | POA: Diagnosis not present

## 2015-12-16 DIAGNOSIS — I10 Essential (primary) hypertension: Secondary | ICD-10-CM | POA: Diagnosis not present

## 2015-12-31 ENCOUNTER — Ambulatory Visit (HOSPITAL_COMMUNITY): Payer: Medicare HMO | Admitting: Hematology & Oncology

## 2016-01-04 DIAGNOSIS — N182 Chronic kidney disease, stage 2 (mild): Secondary | ICD-10-CM | POA: Diagnosis not present

## 2016-01-04 DIAGNOSIS — R809 Proteinuria, unspecified: Secondary | ICD-10-CM | POA: Diagnosis not present

## 2016-01-13 ENCOUNTER — Encounter (HOSPITAL_COMMUNITY): Payer: Medicare HMO | Attending: Hematology & Oncology

## 2016-01-13 DIAGNOSIS — N049 Nephrotic syndrome with unspecified morphologic changes: Secondary | ICD-10-CM

## 2016-01-13 DIAGNOSIS — R778 Other specified abnormalities of plasma proteins: Secondary | ICD-10-CM

## 2016-01-13 DIAGNOSIS — R769 Abnormal immunological finding in serum, unspecified: Secondary | ICD-10-CM | POA: Insufficient documentation

## 2016-01-13 LAB — CBC WITH DIFFERENTIAL/PLATELET
Basophils Absolute: 0 10*3/uL (ref 0.0–0.1)
Basophils Relative: 0 %
Eosinophils Absolute: 0 10*3/uL (ref 0.0–0.7)
Eosinophils Relative: 0 %
HCT: 46 % (ref 39.0–52.0)
Hemoglobin: 15.1 g/dL (ref 13.0–17.0)
Lymphocytes Relative: 13 %
Lymphs Abs: 1.3 10*3/uL (ref 0.7–4.0)
MCH: 28 pg (ref 26.0–34.0)
MCHC: 32.8 g/dL (ref 30.0–36.0)
MCV: 85.3 fL (ref 78.0–100.0)
Monocytes Absolute: 0.3 10*3/uL (ref 0.1–1.0)
Monocytes Relative: 3 %
Neutro Abs: 8.6 10*3/uL — ABNORMAL HIGH (ref 1.7–7.7)
Neutrophils Relative %: 84 %
Platelets: 244 10*3/uL (ref 150–400)
RBC: 5.39 MIL/uL (ref 4.22–5.81)
RDW: 13.2 % (ref 11.5–15.5)
WBC: 10.2 10*3/uL (ref 4.0–10.5)

## 2016-01-13 LAB — COMPREHENSIVE METABOLIC PANEL
ALT: 13 U/L — ABNORMAL LOW (ref 17–63)
AST: 20 U/L (ref 15–41)
Albumin: 3.5 g/dL (ref 3.5–5.0)
Alkaline Phosphatase: 65 U/L (ref 38–126)
Anion gap: 10 (ref 5–15)
BUN: 17 mg/dL (ref 6–20)
CO2: 24 mmol/L (ref 22–32)
Calcium: 8.4 mg/dL — ABNORMAL LOW (ref 8.9–10.3)
Chloride: 102 mmol/L (ref 101–111)
Creatinine, Ser: 1.13 mg/dL (ref 0.61–1.24)
GFR calc Af Amer: >60 mL/min (ref >60)
GFR calc non Af Amer: >60 mL/min (ref >60)
Glucose, Bld: 158 mg/dL — ABNORMAL HIGH (ref 65–99)
Potassium: 3.5 mmol/L (ref 3.5–5.1)
Sodium: 136 mmol/L (ref 135–145)
Total Bilirubin: 1.1 mg/dL (ref 0.3–1.2)
Total Protein: 7 g/dL (ref 6.5–8.1)

## 2016-01-14 DIAGNOSIS — M47816 Spondylosis without myelopathy or radiculopathy, lumbar region: Secondary | ICD-10-CM | POA: Diagnosis not present

## 2016-01-14 LAB — IMMUNOFIXATION ELECTROPHORESIS
IgA: 153 mg/dL (ref 61–437)
IgG (Immunoglobin G), Serum: 1245 mg/dL (ref 700–1600)
IgM, Serum: 32 mg/dL (ref 20–172)
Total Protein ELP: 6.4 g/dL (ref 6.0–8.5)

## 2016-01-14 LAB — KAPPA/LAMBDA LIGHT CHAINS
Kappa free light chain: 20.42 mg/L — ABNORMAL HIGH (ref 3.30–19.40)
Kappa, lambda light chain ratio: 0.64 (ref 0.26–1.65)
Lambda free light chains: 32.03 mg/L — ABNORMAL HIGH (ref 5.71–26.30)

## 2016-01-14 LAB — PROTEIN ELECTROPHORESIS, SERUM
A/G Ratio: 0.9 (ref 0.7–1.7)
Albumin ELP: 3.1 g/dL (ref 2.9–4.4)
Alpha-1-Globulin: 0.2 g/dL (ref 0.0–0.4)
Alpha-2-Globulin: 1 g/dL (ref 0.4–1.0)
Beta Globulin: 0.9 g/dL (ref 0.7–1.3)
Gamma Globulin: 1.1 g/dL (ref 0.4–1.8)
Globulin, Total: 3.3 g/dL (ref 2.2–3.9)
M-Spike, %: 0.6 g/dL — ABNORMAL HIGH
Total Protein ELP: 6.4 g/dL (ref 6.0–8.5)

## 2016-01-14 LAB — IGG, IGA, IGM
IgA: 141 mg/dL (ref 61–437)
IgG (Immunoglobin G), Serum: 1099 mg/dL (ref 700–1600)
IgM, Serum: 35 mg/dL (ref 20–172)

## 2016-01-19 ENCOUNTER — Encounter (HOSPITAL_COMMUNITY): Payer: Self-pay | Admitting: Hematology & Oncology

## 2016-01-19 ENCOUNTER — Encounter (HOSPITAL_BASED_OUTPATIENT_CLINIC_OR_DEPARTMENT_OTHER): Payer: Medicare HMO | Admitting: Hematology & Oncology

## 2016-01-19 VITALS — BP 140/85 | HR 65 | Temp 97.5°F | Resp 20 | Wt 280.8 lb

## 2016-01-19 DIAGNOSIS — R1012 Left upper quadrant pain: Secondary | ICD-10-CM

## 2016-01-19 DIAGNOSIS — M545 Low back pain: Secondary | ICD-10-CM

## 2016-01-19 DIAGNOSIS — R778 Other specified abnormalities of plasma proteins: Secondary | ICD-10-CM

## 2016-01-19 DIAGNOSIS — N049 Nephrotic syndrome with unspecified morphologic changes: Secondary | ICD-10-CM

## 2016-01-19 DIAGNOSIS — D472 Monoclonal gammopathy: Secondary | ICD-10-CM | POA: Diagnosis not present

## 2016-01-19 NOTE — Progress Notes (Signed)
[facility] Hematology/Oncology Consultation   Name: Gregory Diaz      MRN: [id number]     Date: 01/19/2016 Time:1:49 PM   REFERRING PHYSICIAN:  Orson Eva, MD (Hospitalist)  REASON FOR CONSULT:  MGUS BMBX with polyclonal plasmacytosis, normal cytogenetics 08/24/2015 SPEP showing a small M-Spike of 0.5% that may represent a monoclonal protein with significant elevation of kappa and lambda light chains with a normal ratio   UPEP showing 13867 mg/24hr of protein and M-Spike of 555 mg/24hr in the setting of a renal biopsy demonstrating mild focal segmental glomerulosclerosis.  HISTORY OF PRESENT ILLNESS:   Mr. Gregory Diaz is a 65 year old black American man with a past medical history significant for nephrotic syndrome, HTN, diastolic CHL, and obesity who is referred to [facility] for abnormal SPEP.  Had a bone survey on 10/28. He is felt to have MGUS  Mr. Stthomas is accompanied by his wife and son. I personally reviewed and went over laboratory studies with the patient at length.  He notes that his nephrologist has "backed up" on the prednisone. He continues to follow closely with nephrology.  His wife mentions a renal cyst was found on an MRI performed in summer 2016. The patient is concerned about this. He reports self palpated masses in his abdomen and wants them taken out. He is very anxious about how large these lesions are.     Reports lower back pain. He plans to have surgery to correct this. He is unable to walk without taking pain medications. Notes that his pelvis will lock after laying for 15 to 20 minutes. Denies falling or stumbling, though his balance is affected by not being able to turn quickly. States that straightening his back kills him. He will see his surgeon again on April 28th.  He cannot eat anything he wants, but his appetite is good.   He had a colonoscopy in the last 3 to 4 years.   He remarks how much better he feels now that "all the fluid is  gone." his weight is down 50 pounds since last October.   PAST MEDICAL HISTORY:   Past Medical History  Diagnosis Date  . Hypertension   . Heart murmur     was told at age 30, but has not had any problems  . Enlarged prostate   . Anxiety   . Early cataracts, bilateral   . Arthritis     DDD, low back  . Abnormal SPEP 09/09/2015    ALLERGIES: Allergies  Allergen Reactions  . Penicillins Swelling    Has patient had a PCN reaction causing immediate rash, facial/tongue/throat swelling, SOB or lightheadedness with hypotension: Yes Has patient had a PCN reaction causing severe rash involving mucus membranes or skin necrosis: No Has patient had a PCN reaction that required hospitalization No Has patient had a PCN reaction occurring within the last 10 years: No If all of the above answers are "NO", then may proceed with Cephalosporin use.       MEDICATIONS: I have reviewed the patient's current medications.    Current Outpatient Prescriptions on File Prior to Visit  Medication Sig Dispense Refill  . furosemide (LASIX) 80 MG tablet Take 1 tablet (80 mg total) by mouth 2 (two) times daily. 60 tablet 1  . lisinopril (PRINIVIL,ZESTRIL) 20 MG tablet Take 20 mg by mouth daily.    Marland Kitchen oxyCODONE-acetaminophen (PERCOCET) 10-325 MG tablet Take 1 tablet by mouth every 4 (four) hours as  needed for pain.    . predniSONE (DELTASONE) 20 MG tablet Take 20 mg by mouth daily with breakfast.     . ranitidine (ZANTAC) 150 MG capsule Take 150 mg by mouth 2 (two) times daily.    Marland Kitchen senna (SENOKOT) 8.6 MG tablet Take 1 tablet by mouth as needed for constipation. Will take up to 3 if needed    . sitaGLIPtin (JANUVIA) 50 MG tablet Take 1 tablet (50 mg total) by mouth daily. 30 tablet 1  . metolazone (ZAROXOLYN) 2.5 MG tablet Take 2.5 mg by mouth daily. Reported on 01/19/2016     No current facility-administered medications on file prior to visit.     PAST SURGICAL HISTORY Past Surgical History  Procedure  Laterality Date  . Hemorroidectomy    . Fracture surgery Right     thumb  . Colonoscopy    . Hernia repair      umbilical hernia repair  . Tonsillectomy    . Back surgery  2004, 2015  . Eye surgery      L eye- as a child     FAMILY HISTORY: Family History  Problem Relation Age of Onset  . Gallstones Father   . Ulcers Father   . Alcohol abuse Father   . Diabetes Daughter   . Diabetes Brother   . Diabetes Sister    Review of Systems  14 point review of systems was performed and is negative except as detailed under history of present illness and above  SOCIAL HISTORY:  reports that he has been smoking Cigarettes.  He has a 50 pack-year smoking history. He has never used smokeless tobacco. He reports that he does not drink alcohol or use illicit drugs.  PERFORMANCE STATUS: The patient's performance status is 2 - Symptomatic, <50% confined to bed  REVIEW OF SYSTEMS: Positive for back pain, joint pain, and neck pain. Positive for anxiety/nervous. 14 point review of systems was performed and is negative except as detailed under history of present illness and above   PHYSICAL EXAM: Most Recent Vital Signs: Blood pressure 140/85, pulse 65, temperature 97.5 F (36.4 C), temperature source Oral, resp. rate 20, weight 280 lb 12.8 oz (127.37 kg), SpO2 99 %. General appearance: alert, cooperative, appears stated age, no distress, obese and accompanied by his wife and son. Head: Normocephalic, without obvious abnormality, atraumatic Eyes: negative findings: lids and lashes normal, conjunctivae and sclerae normal and corneas clear Throat: normal findings: lips normal without lesions, buccal mucosa normal and oropharynx pink & moist without lesions or evidence of thrush Neck: no adenopathy and supple, symmetrical, trachea midline. Buffalo hump consistent with prolonged steroid use. Lungs: clear to auscultation bilaterally Heart: regular rate and rhythm Abdomen: normal findings: no  masses palpable and no organomegaly and abnormal findings: obese.  Extremities: No edema noted. Skin: Skin color, texture, turgor normal. No rashes or lesions Lymph nodes: Cervical, supraclavicular, and axillary nodes normal. Neurologic: Grossly normal  LABORATORY DATA:  I have reviewed the data as listed Results for SAHEJ, SCHRIEBER (MRN [id number]) as of 01/19/2016 13:49  Ref. Range 01/13/2016 13:14 01/13/2016 13:14 01/13/2016 13:14  Comment Unknown  Comment   Sodium Latest Ref Range: 135-145 mmol/L 136    Potassium Latest Ref Range: 3.5-5.1 mmol/L 3.5    Chloride Latest Ref Range: 101-111 mmol/L 102    CO2 Latest Ref Range: 22-32 mmol/L 24    BUN Latest Ref Range: 6-20 mg/dL 17    Creatinine Latest Ref Range: 0.61-1.24 mg/dL 1.13  Calcium Latest Ref Range: 8.9-10.3 mg/dL 8.4 (L)    EGFR (Non-African Amer.) Latest Ref Range: >60 mL/min >60    EGFR (African American) Latest Ref Range: >60 mL/min >60    Glucose Latest Ref Range: 65-99 mg/dL 158 (H)    Anion gap Latest Ref Range: 5-15  10    Alkaline Phosphatase Latest Ref Range: 38-126 U/L 65    Albumin Latest Ref Range: 3.5-5.0 g/dL 3.5    AST Latest Ref Range: 15-41 U/L 20    ALT Latest Ref Range: 17-63 U/L 13 (L)    Total Protein Latest Ref Range: 6.5-8.1 g/dL 7.0    Total Bilirubin Latest Ref Range: 0.3-1.2 mg/dL 1.1    Total Protein ELP Latest Ref Range: 6.0-8.5 g/dL  6.4 6.4  Albumin ELP Latest Ref Range: 2.9-4.4 g/dL  3.1   Globulin, Total Latest Ref Range: 2.2-3.9 g/dL  3.3   A/G Ratio Latest Ref Range: 0.7-1.7   0.9   Alpha-1-Globulin Latest Ref Range: 0.0-0.4 g/dL  0.2   Alpha-2-Globulin Latest Ref Range: 0.4-1.0 g/dL  1.0   Beta Globulin Latest Ref Range: 0.7-1.3 g/dL  0.9   Gamma Globulin Latest Ref Range: 0.4-1.8 g/dL  1.1   M-SPIKE, % Latest Ref Range: Not Observed g/dL  0.6 (H)   SPE Interp. Unknown  Comment   IgG (Immunoglobin G), Serum Latest Ref Range: 667-691-3417 mg/dL 1099  1245  IgA Latest Ref Range: 61-437 mg/dL 141   153  IgM, Serum Latest Ref Range: 20-172 mg/dL 35  32  Kappa free light chain Latest Ref Range: 3.30-19.40 mg/L 20.42 (H)    Lamda free light chains Latest Ref Range: 5.71-26.30 mg/L 32.03 (H)    Kappa, lamda light chain ratio Latest Ref Range: 0.26-1.65  0.64    WBC Latest Ref Range: 4.0-10.5 K/uL 10.2    RBC Latest Ref Range: 4.22-5.81 MIL/uL 5.39    Hemoglobin Latest Ref Range: 13.0-17.0 g/dL 15.1    HCT Latest Ref Range: 39.0-52.0 % 46.0    MCV Latest Ref Range: 78.0-100.0 fL 85.3    MCH Latest Ref Range: 26.0-34.0 pg 28.0    MCHC Latest Ref Range: 30.0-36.0 g/dL 32.8    RDW Latest Ref Range: 11.5-15.5 % 13.2    Platelets Latest Ref Range: 150-400 K/uL 244    Neutrophils Latest Units: % 84    Lymphocytes Latest Units: % 13    Monocytes Relative Latest Units: % 3    Eosinophil Latest Units: % 0    Basophil Latest Units: % 0    NEUT# Latest Ref Range: 1.7-7.7 K/uL 8.6 (H)    Lymphocyte # Latest Ref Range: 0.7-4.0 K/uL 1.3    Monocyte # Latest Ref Range: 0.1-1.0 K/uL 0.3    Eosinophils Absolute Latest Ref Range: 0.0-0.7 K/uL 0.0    Basophils Absolute Latest Ref Range: 0.0-0.1 K/uL 0.0    Immunofixation Result, Serum Unknown   Comment   RADIOGRAPHY: I have personally reviewed the radiological images as listed and agreed with the findings in the report.   CLINICAL DATA: Abnormal serum protein electrophoresis.  EXAM: METASTATIC BONE SURVEY  COMPARISON: None.  FINDINGS: Severe degenerative disc disease is noted at C6-7 and C7-T1. Multilevel degenerative disc disease is noted in the thoracic spine. Status post surgical posterior fusion of L2, L3, L4 and L5. No lytic lesions are noted involving the visualize ribs, skull, pelvis, spine or extremities.  IMPRESSION: No definite lytic lesions are noted in the visualized skeleton.   Electronically Signed  By: Jeneen Rinks  Murlean Caller, M.D.  On: 09/10/2015 15:59    PATHOLOGY:    Diagnosis Kidney, biopsy w/ EM l, Lower  pole of right kidney KIDNEY, NEEDLE BIOPSY: MILD FOCAL SEGMENTAL GLOMERULOSCLEROSIS (SEE COMMENT) Microscopic Comment For complete report, please see Epic, [id number]. Diagnosis Note Diagnostic Evaluation conducted by J. Annye Rusk, MD at [facility], [place] # [id number] Enid Cutter MD Pathologist, Electronic Signature (Case signed 09/02/2015)       ASSESSMENT/PLAN:  MGUS BMBX with polyclonal plasmacytosis, normal cytogenetics 08/24/2015 SPEP showing a small M-Spike of 0.5% that may represent a monoclonal protein with significant elevation of kappa and lambda light chains with a normal ratio   UPEP showing 13867 mg/24hr of protein and M-Spike of 555 mg/24hr in the setting of a renal biopsy demonstrating mild focal segmental glomerulosclerosis.  Overall he looks markedly improved. Renal function is better. Hypoalbuminemia has resolved. Anemia is also resolved. He continues on prednisone but is in the process of a slow taper.   The patient is concerned about "self palpated masses" in his abdomen. Other than an obese abdomen, exam is benign.  He feels his LUQ is enlarged.  I have ordered an U/S. He is advised he will be called with the results.  He will return in 4 months for follow up with repeat labs. UPEP/IEP will be obtained at his next visit as well.  Orders Placed This Encounter  Procedures  . US Abdomen Complete    Standing Status: Future     Number of Occurrences:      Standing Expiration Date: 01/18/2017    Order Specific Question:  Reason for Exam (SYMPTOM  OR DIAGNOSIS REQUIRED)    Answer:  LUQ discomfort, assymetry    Order Specific Question:  Preferred imaging location?    Answer:  [facility]  . Immunofixation electrophoresis    Standing Status: Future     Number of Occurrences:      Standing Expiration Date: 01/18/2017  . Protein electrophoresis, serum    Standing Status: Future     Number of Occurrences:      Standing Expiration  Date: 01/18/2017  . Kappa/lambda light chains    Standing Status: Future     Number of Occurrences:      Standing Expiration Date: 01/18/2017  . IgG, IgA, IgM    Standing Status: Future     Number of Occurrences:      Standing Expiration Date: 01/18/2017  . CBC with Differential    Standing Status: Future     Number of Occurrences:      Standing Expiration Date: 01/18/2017  . Comprehensive metabolic panel    Standing Status: Future     Number of Occurrences:      Standing Expiration Date: 01/18/2017  . Protein electrophoresis, urine    Standing Status: Future     Number of Occurrences:      Standing Expiration Date: 01/18/2017  . Immunofixation, urine    Standing Status: Future     Number of Occurrences:      Standing Expiration Date: 01/18/2017    All questions were answered. The patient knows to call the clinic with any problems, questions or concerns. We can certainly see the patient much sooner if necessary.  This document serves as a record of services personally performed by Ancil Linsey, MD. It was created on her behalf by Arlyce Harman, a trained medical scribe. The creation of this record is based on the scribe's personal observations and the provider's statements to  them. This document has been checked and approved by the attending provider.  I have reviewed the above documentation for accuracy and completeness, and I agree with the above.  This note is electronically signed by: Molli Hazard, MD  01/19/2016 1:49 PM

## 2016-01-19 NOTE — Patient Instructions (Addendum)
..[place] at [facility] Discharge Instructions  RECOMMENDATIONS MADE BY THE CONSULTANT AND ANY TEST RESULTS WILL BE SENT TO YOUR REFERRING PHYSICIAN.  Exam today per Dr. Gustavus Bryant are all better- kidney function, anemia and protein levels are better We need to keep a watch on this Your anemia was caused partly by kidney function and the autoimmune process that suppressed your bone marrow   We will get an ultrasound to look at the area on your abdominal wall that you are concerned with Return in 4 months with labs and then see Tom a week later  Thank you for choosing [place] at [facility] to provide your oncology and hematology care.  To afford each patient quality time with our provider, please arrive at least 15 minutes before your scheduled appointment time.   Beginning January 23rd 2017 lab work for the [facility] will be done in the  Main lab at [facility] on 1st floor. If you have a lab appointment with the [facility] please come in thru the  Main Entrance and check in at the main information desk  You need to re-schedule your appointment should you arrive 10 or more minutes late.  We strive to give you quality time with our providers, and arriving late affects you and other patients whose appointments are after yours.  Also, if you no show three or more times for appointments you may be dismissed from the clinic at the providers discretion.     Again, thank you for choosing [facility].  Our hope is that these requests will decrease the amount of time that you wait before being seen by our physicians.       _____________________________________________________________  Should you have questions after your visit to [facility], please contact our office at [phone] between the hours of 8:30 a.m. and 4:30 p.m.  Voicemails left after 4:30 p.m. will not be returned until the following business day.   For prescription refill requests, have your pharmacy contact our office.         Resources For Cancer Patients and their Caregivers ? American Cancer Society: Can assist with transportation, wigs, general needs, runs Look Good Feel Better.        515-173-6360 ? Cancer Care: Provides financial assistance, online support groups, medication/co-pay assistance.  1-800-813-HOPE (804) 791-2935) ? [place] Assists [place] cancer patients and their families through emotional , educational and financial support.  912-833-2080 ? [place] DSS Where to apply for food stamps, Medicaid and utility assistance. (719)651-6605 ? [facility]: Transportation to medical appointments. 9293411129 ? Social Security Administration: May apply for disability if have a Stage IV cancer. 267-259-2375 715 275 0467 ? [employer], Disability and Transit Services: Assists with nutrition, care and transit needs. 435-442-6099 Urine Collection HOW DO I DO A 24-HOUR URINE COLLECTION?  When you get up in the morning, urinate in the toilet and flush. Write down the time. This will be your start time on the day of collection and your end time on the next morning.  From then on, collect all of your urine in the plastic jug that is given to you.  Stop collecting your urine 24 hours after you started.  If the plastic jug that is given to you already has liquid in it, that is okay. Do not throw out the liquid or rinse out the jug. Some tests need the liquid to be added to  your urine.  Keep your plastic jug cool in an ice chest or keep it in the refrigerator during the test.  When 24 hours are over, bring your plastic jug to the clinic lab. Keep the jug cool in an ice chest while you are bringing it to the lab.   This information is not intended to replace advice given to you by your health care provider. Make sure you discuss any questions you have with your health care  provider.   Document Released: 01/26/2009 Document Revised: 11/20/2014 Document Reviewed: 03/25/2014 Elsevier Interactive Patient Education [employer].

## 2016-01-29 DIAGNOSIS — D472 Monoclonal gammopathy: Secondary | ICD-10-CM

## 2016-01-29 HISTORY — DX: Monoclonal gammopathy: D47.2

## 2016-01-31 ENCOUNTER — Other Ambulatory Visit: Payer: Self-pay | Admitting: Neurosurgery

## 2016-01-31 DIAGNOSIS — M5137 Other intervertebral disc degeneration, lumbosacral region: Secondary | ICD-10-CM

## 2016-02-01 DIAGNOSIS — N182 Chronic kidney disease, stage 2 (mild): Secondary | ICD-10-CM | POA: Diagnosis not present

## 2016-02-01 DIAGNOSIS — R809 Proteinuria, unspecified: Secondary | ICD-10-CM | POA: Diagnosis not present

## 2016-02-02 ENCOUNTER — Ambulatory Visit (HOSPITAL_COMMUNITY)
Admission: RE | Admit: 2016-02-02 | Discharge: 2016-02-02 | Disposition: A | Discharge status: Home / Self Care | Payer: Medicare HMO | Source: Ambulatory Visit | Attending: Hematology & Oncology | Admitting: Hematology & Oncology

## 2016-02-02 DIAGNOSIS — K8020 Calculus of gallbladder without cholecystitis without obstruction: Secondary | ICD-10-CM | POA: Insufficient documentation

## 2016-02-02 DIAGNOSIS — R1012 Left upper quadrant pain: Secondary | ICD-10-CM | POA: Diagnosis not present

## 2016-02-02 DIAGNOSIS — R932 Abnormal findings on diagnostic imaging of liver and biliary tract: Secondary | ICD-10-CM | POA: Insufficient documentation

## 2016-02-02 DIAGNOSIS — N281 Cyst of kidney, acquired: Secondary | ICD-10-CM | POA: Diagnosis not present

## 2016-02-22 DIAGNOSIS — M5137 Other intervertebral disc degeneration, lumbosacral region: Secondary | ICD-10-CM | POA: Diagnosis not present

## 2016-02-24 DIAGNOSIS — H04162 Lacrimal gland dislocation, left lacrimal gland: Secondary | ICD-10-CM | POA: Diagnosis not present

## 2016-03-01 DIAGNOSIS — N041 Nephrotic syndrome with focal and segmental glomerular lesions: Secondary | ICD-10-CM | POA: Diagnosis not present

## 2016-03-01 DIAGNOSIS — D509 Iron deficiency anemia, unspecified: Secondary | ICD-10-CM | POA: Diagnosis not present

## 2016-03-01 DIAGNOSIS — Z720 Tobacco use: Secondary | ICD-10-CM | POA: Diagnosis not present

## 2016-03-01 DIAGNOSIS — E669 Obesity, unspecified: Secondary | ICD-10-CM | POA: Diagnosis not present

## 2016-03-01 DIAGNOSIS — I10 Essential (primary) hypertension: Secondary | ICD-10-CM | POA: Diagnosis not present

## 2016-03-01 DIAGNOSIS — R809 Proteinuria, unspecified: Secondary | ICD-10-CM | POA: Diagnosis not present

## 2016-03-01 DIAGNOSIS — D472 Monoclonal gammopathy: Secondary | ICD-10-CM | POA: Diagnosis not present

## 2016-03-01 DIAGNOSIS — E1122 Type 2 diabetes mellitus with diabetic chronic kidney disease: Secondary | ICD-10-CM | POA: Diagnosis not present

## 2016-03-01 DIAGNOSIS — N182 Chronic kidney disease, stage 2 (mild): Secondary | ICD-10-CM | POA: Diagnosis not present

## 2016-03-09 DIAGNOSIS — R809 Proteinuria, unspecified: Secondary | ICD-10-CM | POA: Diagnosis not present

## 2016-03-09 DIAGNOSIS — N182 Chronic kidney disease, stage 2 (mild): Secondary | ICD-10-CM | POA: Diagnosis not present

## 2016-03-10 DIAGNOSIS — M47816 Spondylosis without myelopathy or radiculopathy, lumbar region: Secondary | ICD-10-CM | POA: Diagnosis not present

## 2016-03-10 DIAGNOSIS — M5126 Other intervertebral disc displacement, lumbar region: Secondary | ICD-10-CM | POA: Diagnosis not present

## 2016-03-10 DIAGNOSIS — Z981 Arthrodesis status: Secondary | ICD-10-CM | POA: Diagnosis not present

## 2016-03-15 DIAGNOSIS — M47816 Spondylosis without myelopathy or radiculopathy, lumbar region: Secondary | ICD-10-CM | POA: Diagnosis not present

## 2016-03-23 DIAGNOSIS — R69 Illness, unspecified: Secondary | ICD-10-CM | POA: Diagnosis not present

## 2016-03-23 DIAGNOSIS — H0589 Other disorders of orbit: Secondary | ICD-10-CM | POA: Diagnosis not present

## 2016-03-23 DIAGNOSIS — H0520 Unspecified exophthalmos: Secondary | ICD-10-CM | POA: Diagnosis not present

## 2016-03-23 DIAGNOSIS — Z79899 Other long term (current) drug therapy: Secondary | ICD-10-CM | POA: Diagnosis not present

## 2016-03-23 DIAGNOSIS — H04162 Lacrimal gland dislocation, left lacrimal gland: Secondary | ICD-10-CM | POA: Diagnosis not present

## 2016-03-23 DIAGNOSIS — Z880 Allergy status to penicillin: Secondary | ICD-10-CM | POA: Diagnosis not present

## 2016-03-23 DIAGNOSIS — H2513 Age-related nuclear cataract, bilateral: Secondary | ICD-10-CM | POA: Diagnosis not present

## 2016-03-23 DIAGNOSIS — I10 Essential (primary) hypertension: Secondary | ICD-10-CM | POA: Diagnosis not present

## 2016-04-03 IMAGING — CT CT BIOPSY
1 of 2 series · 2 of 4 positions shown, 5 images · non-contrast
Comparison: none

CLINICAL DATA: 64-year-old with mild focal segmental
SPEP.

[Series 4: add scan 5.0 b40f · axial · 0.68mm/px · z∈[-274,-269]mm · 2 of 2 slices shown, 5 images]
[im 1/2  soft-tissue]
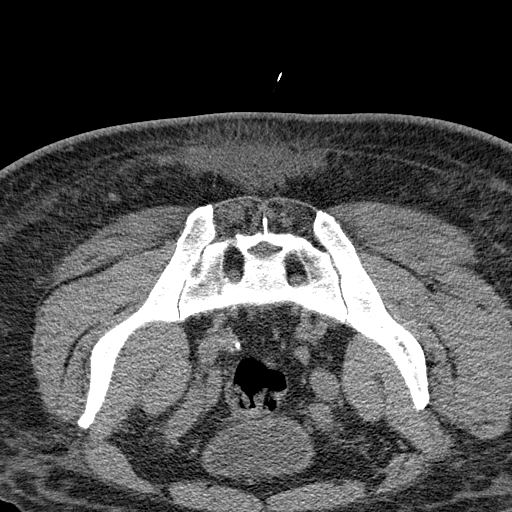
[im 1/2  lung]
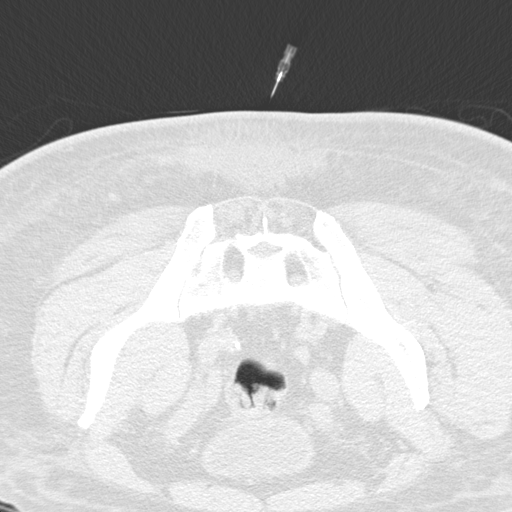
[im 1/2  bone]
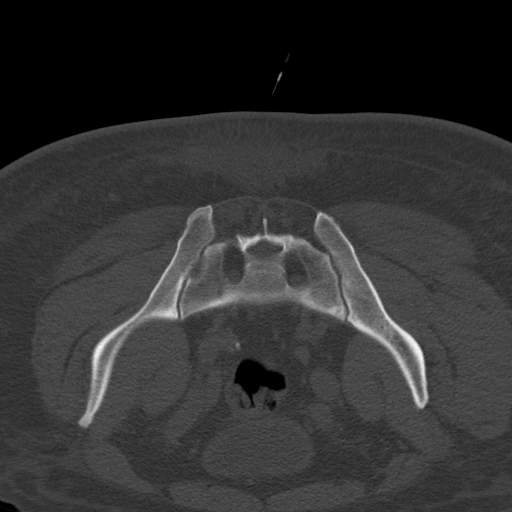
[im 2/2  soft-tissue]
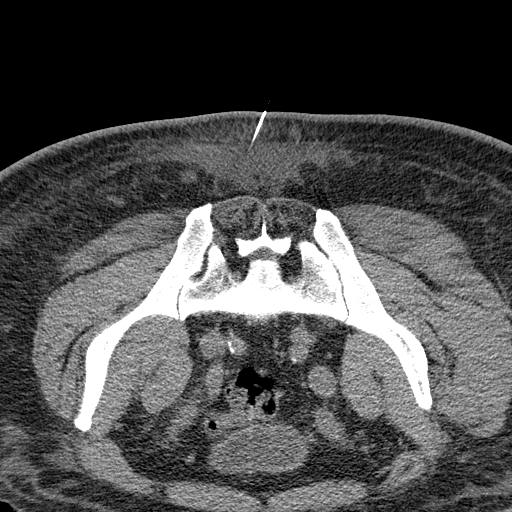
[im 2/2  lung]
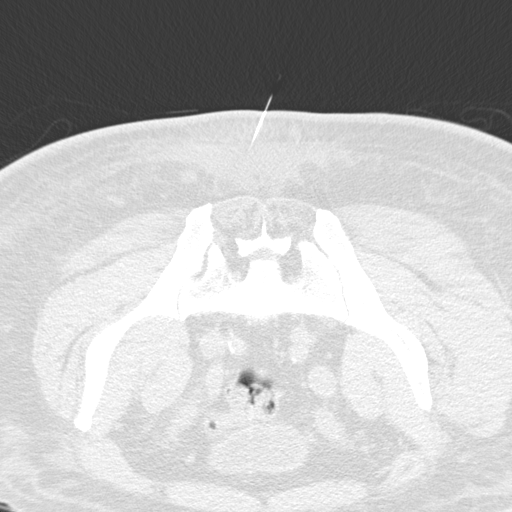

[2 of 4 positions shown; findings below may reference images not displayed]

EXAM:
CT GUIDED BONE MARROW ASPIRATES AND BIOPSY

MEDICATIONS:
4 mg Versed, 75 mcg fentanyl. A radiology nurse monitored the
patient for moderate sedation.

ANESTHESIA/SEDATION:
Sedation time: 22 minutes

PROCEDURE:
The procedure was explained to the patient. The risks and benefits
of the procedure were discussed and the patient's questions were
addressed. Informed consent was obtained from the patient. The
patient was placed prone on CT scan. Images of the pelvis were
obtained. The right side of back was prepped and draped in sterile
fashion. The skin and right posterior iliac bone were anesthetized
with 1% lidocaine. 11 gauge bone needle was directed into the right
iliac bone with CT guidance. Two aspirates and two core biopsies
were obtained. Bandage placed over the puncture site.
FINDINGS: Surgical fixation in the lower lumbar spine. Needle directed into
the posterior right ilium.

Estimated blood loss: Minimal

COMPLICATIONS:
None
IMPRESSION: CT guided bone marrow aspirates and core biopsies.

## 2016-04-05 DIAGNOSIS — N182 Chronic kidney disease, stage 2 (mild): Secondary | ICD-10-CM | POA: Diagnosis not present

## 2016-04-05 DIAGNOSIS — R809 Proteinuria, unspecified: Secondary | ICD-10-CM | POA: Diagnosis not present

## 2016-04-11 DIAGNOSIS — Z6837 Body mass index (BMI) 37.0-37.9, adult: Secondary | ICD-10-CM | POA: Diagnosis not present

## 2016-04-11 DIAGNOSIS — M5137 Other intervertebral disc degeneration, lumbosacral region: Secondary | ICD-10-CM | POA: Diagnosis not present

## 2016-04-11 DIAGNOSIS — I10 Essential (primary) hypertension: Secondary | ICD-10-CM | POA: Diagnosis not present

## 2016-04-20 ENCOUNTER — Emergency Department (HOSPITAL_COMMUNITY)
Admission: EM | Admit: 2016-04-20 | Discharge: 2016-04-20 | Disposition: A | Discharge status: Home / Self Care | Payer: Medicare HMO | Attending: Emergency Medicine | Admitting: Emergency Medicine

## 2016-04-20 ENCOUNTER — Encounter (HOSPITAL_COMMUNITY): Payer: Self-pay

## 2016-04-20 ENCOUNTER — Emergency Department (HOSPITAL_COMMUNITY): Payer: Medicare HMO

## 2016-04-20 DIAGNOSIS — M1990 Unspecified osteoarthritis, unspecified site: Secondary | ICD-10-CM | POA: Diagnosis not present

## 2016-04-20 DIAGNOSIS — F17210 Nicotine dependence, cigarettes, uncomplicated: Secondary | ICD-10-CM | POA: Diagnosis not present

## 2016-04-20 DIAGNOSIS — M5416 Radiculopathy, lumbar region: Secondary | ICD-10-CM | POA: Diagnosis not present

## 2016-04-20 DIAGNOSIS — Z79891 Long term (current) use of opiate analgesic: Secondary | ICD-10-CM | POA: Diagnosis not present

## 2016-04-20 DIAGNOSIS — Z79899 Other long term (current) drug therapy: Secondary | ICD-10-CM | POA: Diagnosis not present

## 2016-04-20 DIAGNOSIS — R69 Illness, unspecified: Secondary | ICD-10-CM | POA: Diagnosis not present

## 2016-04-20 DIAGNOSIS — Z981 Arthrodesis status: Secondary | ICD-10-CM | POA: Diagnosis not present

## 2016-04-20 DIAGNOSIS — I10 Essential (primary) hypertension: Secondary | ICD-10-CM | POA: Diagnosis not present

## 2016-04-20 DIAGNOSIS — M545 Low back pain: Secondary | ICD-10-CM | POA: Diagnosis present

## 2016-04-20 LAB — CBG MONITORING, ED: Glucose-Capillary: 98 mg/dL (ref 65–99)

## 2016-04-20 MED ORDER — DEXAMETHASONE SODIUM PHOSPHATE 10 MG/ML IJ SOLN
10.0000 mg | Freq: Once | INTRAMUSCULAR | Status: AC
  Administered 2016-04-20: 10 mg via INTRAMUSCULAR
  Filled 2016-04-20: qty 1

## 2016-04-20 MED ORDER — ONDANSETRON 8 MG PO TBDP
8.0000 mg | ORAL_TABLET | Freq: Once | ORAL | Status: AC
  Administered 2016-04-20: 8 mg via ORAL
  Filled 2016-04-20: qty 1

## 2016-04-20 MED ORDER — HYDROMORPHONE HCL 2 MG/ML IJ SOLN
2.0000 mg | Freq: Once | INTRAMUSCULAR | Status: AC
  Administered 2016-04-20: 2 mg via INTRAMUSCULAR
  Filled 2016-04-20: qty 1

## 2016-04-20 MED ORDER — PREDNISONE 20 MG PO TABS: ORAL_TABLET | ORAL | Status: DC

## 2016-04-20 NOTE — ED Notes (Signed)
Pt reports chronic lower back pain, tonight with severe pain with shooting down left leg.  Pt states he has had 3 back surgeries

## 2016-04-20 NOTE — ED Provider Notes (Signed)
CSN: [id number]     Arrival date & time 04/20/16  0206 History   First MD Initiated Contact with Patient 04/20/16 0209     Chief Complaint  Patient presents with  . Back Pain     (Consider location/radiation/quality/duration/timing/severity/associated sxs/prior Treatment) HPI Comments: Patient presents to the emergency department for evaluation of low back pain. Patient has a history of chronic low back pain with radiculopathy. He has had 3 previous lumbar surgeries. Patient reports that he always has pain radiating down his left leg with numbness of his foot, but pain tonight became much worse. He is having severe sharp pains with any movement. Patient denies any recent injury. No change in bowel or bladder function.  Patient is a 65 y.o. male presenting with back pain.  Back Pain   Past Medical History  Diagnosis Date  . Hypertension   . Heart murmur     was told at age 42, but has not had any problems  . Enlarged prostate   . Anxiety   . Early cataracts, bilateral   . Arthritis     DDD, low back  . Abnormal SPEP 09/09/2015   Past Surgical History  Procedure Laterality Date  . Hemorroidectomy    . Fracture surgery Right     thumb  . Colonoscopy    . Hernia repair      umbilical hernia repair  . Tonsillectomy    . Back surgery  2004, 2015  . Eye surgery      L eye- as a child    Family History  Problem Relation Age of Onset  . Gallstones Father   . Ulcers Father   . Alcohol abuse Father   . Diabetes Daughter   . Diabetes Brother   . Diabetes Sister    Social History  Substance Use Topics  . Smoking status: Current Every Day Smoker -- 1.00 packs/day for 50 years    Types: Cigarettes  . Smokeless tobacco: Never Used  . Alcohol Use: No    Review of Systems  Musculoskeletal: Positive for back pain.  All other systems reviewed and are negative.     Allergies  Penicillins  Home Medications   Prior to Admission medications   Medication Sig Start Date  End Date Taking? Authorizing Provider  furosemide (LASIX) 80 MG tablet Take 1 tablet (80 mg total) by mouth 2 (two) times daily. 08/27/15  Yes Orson Eva, MD  lisinopril (PRINIVIL,ZESTRIL) 20 MG tablet Take 20 mg by mouth daily.   Yes Historical Provider, MD  metolazone (ZAROXOLYN) 2.5 MG tablet Take 2.5 mg by mouth daily. Reported on 01/19/2016   Yes Historical Provider, MD  oxyCODONE-acetaminophen (PERCOCET) 10-325 MG tablet Take 1 tablet by mouth every 4 (four) hours as needed for pain.   Yes Historical Provider, MD  predniSONE (DELTASONE) 20 MG tablet Take 20 mg by mouth daily with breakfast.    Yes Historical Provider, MD  ranitidine (ZANTAC) 150 MG capsule Take 150 mg by mouth 2 (two) times daily.   Yes Historical Provider, MD  senna (SENOKOT) 8.6 MG tablet Take 1 tablet by mouth as needed for constipation. Will take up to 3 if needed   Yes Historical Provider, MD  sitaGLIPtin (JANUVIA) 50 MG tablet Take 1 tablet (50 mg total) by mouth daily. 08/27/15  Yes Orson Eva, MD  predniSONE (DELTASONE) 20 MG tablet Take 3 tablets daily for 3 days, then take 2.5 tablets daily for 3 days, then take 2 tablets daily for 3 days,  then take 1.5 tablets daily for 3 days, then go back to your normal dosing. 04/20/16   Orpah Greek, MD   BP 151/87 mmHg  Pulse 53  Temp(Src) 97.7 F (36.5 C) (Oral)  Resp 20  Ht 6\' 2"  (1.88 m)  Wt 280 lb (127.007 kg)  BMI 35.93 kg/m2  SpO2 99% Physical Exam  Constitutional: He is oriented to person, place, and time. He appears well-developed and well-nourished. No distress.  HENT:  Head: Normocephalic and atraumatic.  Right Ear: Hearing normal.  Left Ear: Hearing normal.  Nose: Nose normal.  Mouth/Throat: Oropharynx is clear and moist and mucous membranes are normal.  Eyes: Conjunctivae and EOM are normal. Pupils are equal, round, and reactive to light.  Neck: Normal range of motion. Neck supple.  Cardiovascular: Regular rhythm, S1 normal and S2 normal.  Exam  reveals no gallop and no friction rub.   No murmur heard. Pulmonary/Chest: Effort normal and breath sounds normal. No respiratory distress. He exhibits no tenderness.  Abdominal: Soft. Normal appearance and bowel sounds are normal. There is no hepatosplenomegaly. There is no tenderness. There is no rebound, no guarding, no tenderness at McBurney's point and negative Murphy's sign. No hernia.  Musculoskeletal: Normal range of motion.       Lumbar back: He exhibits tenderness.       Back:  +left str leg raise  Neurological: He is alert and oriented to person, place, and time. He has normal strength. No cranial nerve deficit or sensory deficit. Coordination normal. GCS eye subscore is 4. GCS verbal subscore is 5. GCS motor subscore is 6.  Skin: Skin is warm, dry and intact. No rash noted. No cyanosis.  Psychiatric: He has a normal mood and affect. His speech is normal and behavior is normal. Thought content normal.  Nursing note and vitals reviewed.   ED Course  Procedures (including critical care time) Labs Review Labs Reviewed  CBG MONITORING, ED    Imaging Review Dg Lumbar Spine Complete  04/20/2016  CLINICAL DATA:  Evaluation for chronic low back pain, worsened tonight. EXAM: LUMBAR SPINE - COMPLETE 4+ VIEW COMPARISON:  Prior CT from 03/10/2016. FINDINGS: Alignment is stable. Patient is status post posterior spinal fusion from L3 through S1. Hardware is stable in appearance without complication. Multilevel degenerative spondylolysis is stable. No acute fracture or subluxation. Visualized sacrum intact. Visualized soft tissues within normal limits. IMPRESSION: 1. No acute abnormality within the lumbar spine. 2. Stable appearance of prior posterior spinal fusion spanning L3 through S1 S1 with stable multilevel degenerative spondylolysis. Electronically Signed   By: Jeannine Boga M.D.   On: 04/20/2016 03:06   I have personally reviewed and evaluated these images and lab results as part  of my medical decision-making.   EKG Interpretation None      MDM   Final diagnoses:  Lumbar radiculopathy, acute    Presents to the ER for evaluation of low back pain. Patient has a history of chronic back pain and is status post 3 lumbar surgeries. He has chronic left-sided radiculopathy but his pain is worse. He does not have any appreciable neurologic deficit on examination. Patient experiencing spasms with movement. This has improved with analgesia here in the ER. Patient on chronic steroids at 20 mg. This will be increased and tapered back to 20 mg, also given Decadron here in the ER. He has follow-up scheduled with his back surgeon.    Orpah Greek, MD 04/20/16 [phone]

## 2016-04-20 NOTE — ED Notes (Signed)
Pt ambulated to the restroom without assistance and was given ice water as requested.

## 2016-04-20 NOTE — Discharge Instructions (Signed)
Lumbosacral Radiculopathy °Lumbosacral radiculopathy is a condition that involves the spinal nerves and nerve roots in the low back and bottom of the spine. The condition develops when these nerves and nerve roots move out of place or become inflamed and cause symptoms. °CAUSES °This condition may be caused by: °· Pressure from a disk that bulges out of place (herniated disk). A disk is a plate of cartilage that separates bones in the spine. °· Disk degeneration. °· A narrowing of the bones of the lower back (spinal stenosis). °· A tumor. °· An infection. °· An injury that places sudden pressure on the disks that cushion the bones of your lower spine. °RISK FACTORS °This condition is more likely to develop in: °· Males aged 30-50 years. °· Females aged 50-60 years. °· People who lift improperly. °· People who are overweight or live a sedentary lifestyle. °· People who smoke. °· People who perform repetitive activities that strain the spine. °SYMPTOMS °Symptoms of this condition include: °· Pain that goes down from the back into the legs (sciatica). This is the most common symptom. The pain may be worse with sitting, coughing, or sneezing. °· Pain and numbness in the arms and legs. °· Muscle weakness. °· Tingling. °· Loss of bladder control or bowel control. °DIAGNOSIS °This condition is diagnosed with a physical exam and medical history. If the pain is lasting, you may have tests, such as: °· MRI scan. °· X-ray. °· CT scan. °· Myelogram. °· Nerve conduction study. °TREATMENT °This condition is often treated with: °· Hot packs and ice applied to affected areas. °· Stretches to improve flexibility. °· Exercises to strengthen back muscles. °· Physical therapy. °· Pain medicine. °· A steroid injection in the spine. °In some cases, no treatment is needed. If the condition is long-lasting (chronic), or if symptoms are severe, treatment may involve surgery or lifestyle changes, such as following a weight loss plan. °HOME  CARE INSTRUCTIONS °Medicines °· Take medicines only as directed by your health care provider. °· Do not drive or operate heavy machinery while taking pain medicine. °Injury Care °· Apply a heat pack to the injured area as directed by your health care provider. °· Apply ice to the affected area: °¨ Put ice in a plastic bag. °¨ Place a towel between your skin and the bag. °¨ Leave the ice on for 20-30 minutes, every 2 hours while you are awake or as needed. Or, leave the ice on for as long as directed by your health care provider. °Other Instructions °· If you were shown how to do any exercises or stretches, do them as directed by your health care provider. °· If your health care provider prescribed a diet or exercise program, follow it as directed. °· Keep all follow-up visits as directed by your health care provider. This is important. °SEEK MEDICAL CARE IF: °· Your pain does not improve over time even when taking pain medicines. °SEEK IMMEDIATE MEDICAL CARE IF: °· Your develop severe pain. °· Your pain suddenly gets worse. °· You develop increasing weakness in your legs. °· You lose the ability to control your bladder or bowel. °· You have difficulty walking or balancing. °· You have a fever. °  °This information is not intended to replace advice given to you by your health care provider. Make sure you discuss any questions you have with your health care provider. °  °Document Released: 10/30/2005 Document Revised: 03/16/2015 Document Reviewed: 10/26/2014 °Elsevier Interactive Patient Education ©2016 Elsevier Inc. ° °

## 2016-05-09 DIAGNOSIS — M47815 Spondylosis without myelopathy or radiculopathy, thoracolumbar region: Secondary | ICD-10-CM | POA: Diagnosis not present

## 2016-05-10 DIAGNOSIS — R809 Proteinuria, unspecified: Secondary | ICD-10-CM | POA: Diagnosis not present

## 2016-05-10 DIAGNOSIS — N182 Chronic kidney disease, stage 2 (mild): Secondary | ICD-10-CM | POA: Diagnosis not present

## 2016-05-13 ENCOUNTER — Emergency Department (HOSPITAL_COMMUNITY)
Admission: EM | Admit: 2016-05-13 | Discharge: 2016-05-14 | Disposition: A | Discharge status: Home / Self Care | Payer: Medicare HMO | Attending: Emergency Medicine | Admitting: Emergency Medicine

## 2016-05-13 ENCOUNTER — Encounter (HOSPITAL_COMMUNITY): Payer: Self-pay | Admitting: Emergency Medicine

## 2016-05-13 ENCOUNTER — Emergency Department (HOSPITAL_COMMUNITY): Payer: Medicare HMO

## 2016-05-13 DIAGNOSIS — Z79899 Other long term (current) drug therapy: Secondary | ICD-10-CM | POA: Insufficient documentation

## 2016-05-13 DIAGNOSIS — Y9389 Activity, other specified: Secondary | ICD-10-CM | POA: Diagnosis not present

## 2016-05-13 DIAGNOSIS — Y999 Unspecified external cause status: Secondary | ICD-10-CM | POA: Diagnosis not present

## 2016-05-13 DIAGNOSIS — S8992XA Unspecified injury of left lower leg, initial encounter: Secondary | ICD-10-CM | POA: Diagnosis not present

## 2016-05-13 DIAGNOSIS — S199XXA Unspecified injury of neck, initial encounter: Secondary | ICD-10-CM | POA: Diagnosis not present

## 2016-05-13 DIAGNOSIS — I10 Essential (primary) hypertension: Secondary | ICD-10-CM | POA: Insufficient documentation

## 2016-05-13 DIAGNOSIS — Y92410 Unspecified street and highway as the place of occurrence of the external cause: Secondary | ICD-10-CM | POA: Insufficient documentation

## 2016-05-13 DIAGNOSIS — S3690XA Unspecified injury of unspecified intra-abdominal organ, initial encounter: Secondary | ICD-10-CM | POA: Diagnosis not present

## 2016-05-13 DIAGNOSIS — M25512 Pain in left shoulder: Secondary | ICD-10-CM | POA: Diagnosis not present

## 2016-05-13 DIAGNOSIS — M1990 Unspecified osteoarthritis, unspecified site: Secondary | ICD-10-CM | POA: Insufficient documentation

## 2016-05-13 DIAGNOSIS — S299XXA Unspecified injury of thorax, initial encounter: Secondary | ICD-10-CM | POA: Diagnosis not present

## 2016-05-13 DIAGNOSIS — R69 Illness, unspecified: Secondary | ICD-10-CM | POA: Diagnosis not present

## 2016-05-13 DIAGNOSIS — F17210 Nicotine dependence, cigarettes, uncomplicated: Secondary | ICD-10-CM | POA: Insufficient documentation

## 2016-05-13 DIAGNOSIS — Z791 Long term (current) use of non-steroidal anti-inflammatories (NSAID): Secondary | ICD-10-CM | POA: Insufficient documentation

## 2016-05-13 MED ORDER — METHOCARBAMOL 500 MG PO TABS
500.0000 mg | ORAL_TABLET | Freq: Once | ORAL | Status: AC
  Administered 2016-05-14: 500 mg via ORAL
  Filled 2016-05-13: qty 1

## 2016-05-13 MED ORDER — METHOCARBAMOL 500 MG PO TABS: 500.0000 mg | ORAL_TABLET | Freq: Two times a day (BID) | ORAL | Status: DC | PRN

## 2016-05-13 MED ORDER — IBUPROFEN 800 MG PO TABS
800.0000 mg | ORAL_TABLET | Freq: Once | ORAL | Status: DC
  Filled 2016-05-13: qty 1

## 2016-05-13 MED ORDER — IBUPROFEN 800 MG PO TABS: 800.0000 mg | ORAL_TABLET | Freq: Three times a day (TID) | ORAL | Status: DC
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
## 2016-05-13 NOTE — Discharge Instructions (Signed)

## 2016-05-14 DIAGNOSIS — M25512 Pain in left shoulder: Secondary | ICD-10-CM | POA: Diagnosis not present

## 2016-05-15 DIAGNOSIS — Z01818 Encounter for other preprocedural examination: Secondary | ICD-10-CM | POA: Diagnosis not present

## 2016-05-15 DIAGNOSIS — M4326 Fusion of spine, lumbar region: Secondary | ICD-10-CM | POA: Diagnosis not present

## 2016-05-15 DIAGNOSIS — Z981 Arthrodesis status: Secondary | ICD-10-CM | POA: Diagnosis not present

## 2016-05-17 ENCOUNTER — Encounter (HOSPITAL_COMMUNITY): Payer: Medicare HMO | Attending: Hematology & Oncology

## 2016-05-17 DIAGNOSIS — M1990 Unspecified osteoarthritis, unspecified site: Secondary | ICD-10-CM | POA: Diagnosis not present

## 2016-05-17 DIAGNOSIS — R69 Illness, unspecified: Secondary | ICD-10-CM | POA: Diagnosis not present

## 2016-05-17 DIAGNOSIS — F17210 Nicotine dependence, cigarettes, uncomplicated: Secondary | ICD-10-CM | POA: Insufficient documentation

## 2016-05-17 DIAGNOSIS — I10 Essential (primary) hypertension: Secondary | ICD-10-CM | POA: Insufficient documentation

## 2016-05-17 DIAGNOSIS — D472 Monoclonal gammopathy: Secondary | ICD-10-CM | POA: Insufficient documentation

## 2016-05-17 DIAGNOSIS — F419 Anxiety disorder, unspecified: Secondary | ICD-10-CM | POA: Diagnosis not present

## 2016-05-17 DIAGNOSIS — Z9889 Other specified postprocedural states: Secondary | ICD-10-CM | POA: Insufficient documentation

## 2016-05-17 DIAGNOSIS — M5136 Other intervertebral disc degeneration, lumbar region: Secondary | ICD-10-CM | POA: Insufficient documentation

## 2016-05-17 DIAGNOSIS — R778 Other specified abnormalities of plasma proteins: Secondary | ICD-10-CM

## 2016-05-17 LAB — COMPREHENSIVE METABOLIC PANEL
ALT: 18 U/L (ref 17–63)
AST: 20 U/L (ref 15–41)
Albumin: 3 g/dL — ABNORMAL LOW (ref 3.5–5.0)
Alkaline Phosphatase: 66 U/L (ref 38–126)
Anion gap: 6 (ref 5–15)
BUN: 23 mg/dL — ABNORMAL HIGH (ref 6–20)
CO2: 25 mmol/L (ref 22–32)
Calcium: 8.3 mg/dL — ABNORMAL LOW (ref 8.9–10.3)
Chloride: 107 mmol/L (ref 101–111)
Creatinine, Ser: 1.08 mg/dL (ref 0.61–1.24)
GFR calc Af Amer: >60 mL/min (ref >60)
GFR calc non Af Amer: >60 mL/min (ref >60)
Glucose, Bld: 126 mg/dL — ABNORMAL HIGH (ref 65–99)
Potassium: 4.1 mmol/L (ref 3.5–5.1)
Sodium: 138 mmol/L (ref 135–145)
Total Bilirubin: 0.8 mg/dL (ref 0.3–1.2)
Total Protein: 6.3 g/dL — ABNORMAL LOW (ref 6.5–8.1)

## 2016-05-17 LAB — CBC WITH DIFFERENTIAL/PLATELET
Basophils Absolute: 0 10*3/uL (ref 0.0–0.1)
Basophils Relative: 0 %
Eosinophils Absolute: 0 10*3/uL (ref 0.0–0.7)
Eosinophils Relative: 0 %
HCT: 40.8 % (ref 39.0–52.0)
Hemoglobin: 13.4 g/dL (ref 13.0–17.0)
Lymphocytes Relative: 9 %
Lymphs Abs: 0.8 10*3/uL (ref 0.7–4.0)
MCH: 28.3 pg (ref 26.0–34.0)
MCHC: 32.8 g/dL (ref 30.0–36.0)
MCV: 86.3 fL (ref 78.0–100.0)
Monocytes Absolute: 0.3 10*3/uL (ref 0.1–1.0)
Monocytes Relative: 4 %
Neutro Abs: 7.9 10*3/uL — ABNORMAL HIGH (ref 1.7–7.7)
Neutrophils Relative %: 87 %
Platelets: 202 10*3/uL (ref 150–400)
RBC: 4.73 MIL/uL (ref 4.22–5.81)
RDW: 12.6 % (ref 11.5–15.5)
WBC: 9.1 10*3/uL (ref 4.0–10.5)

## 2016-05-18 DIAGNOSIS — R69 Illness, unspecified: Secondary | ICD-10-CM | POA: Diagnosis not present

## 2016-05-18 DIAGNOSIS — M4326 Fusion of spine, lumbar region: Secondary | ICD-10-CM | POA: Diagnosis not present

## 2016-05-18 DIAGNOSIS — M47816 Spondylosis without myelopathy or radiculopathy, lumbar region: Secondary | ICD-10-CM | POA: Diagnosis not present

## 2016-05-18 DIAGNOSIS — R2689 Other abnormalities of gait and mobility: Secondary | ICD-10-CM | POA: Diagnosis not present

## 2016-05-18 DIAGNOSIS — Z880 Allergy status to penicillin: Secondary | ICD-10-CM | POA: Diagnosis not present

## 2016-05-18 DIAGNOSIS — M4306 Spondylolysis, lumbar region: Secondary | ICD-10-CM | POA: Diagnosis not present

## 2016-05-18 DIAGNOSIS — E119 Type 2 diabetes mellitus without complications: Secondary | ICD-10-CM | POA: Diagnosis not present

## 2016-05-18 DIAGNOSIS — Z981 Arthrodesis status: Secondary | ICD-10-CM | POA: Diagnosis not present

## 2016-05-18 DIAGNOSIS — Z7984 Long term (current) use of oral hypoglycemic drugs: Secondary | ICD-10-CM | POA: Diagnosis not present

## 2016-05-18 DIAGNOSIS — I10 Essential (primary) hypertension: Secondary | ICD-10-CM | POA: Diagnosis not present

## 2016-05-18 DIAGNOSIS — T84226A Displacement of internal fixation device of vertebrae, initial encounter: Secondary | ICD-10-CM | POA: Diagnosis not present

## 2016-05-18 DIAGNOSIS — M960 Pseudarthrosis after fusion or arthrodesis: Secondary | ICD-10-CM | POA: Diagnosis not present

## 2016-05-18 DIAGNOSIS — Z79899 Other long term (current) drug therapy: Secondary | ICD-10-CM | POA: Diagnosis not present

## 2016-05-18 LAB — UIFE/LIGHT CHAINS/TP QN, 24-HR UR
% BETA, Urine: 7.4 %
ALPHA 1 URINE: 5.2 %
Albumin, U: 76.4 %
Alpha 2, Urine: 4.2 %
Free Kappa/Lambda Ratio: 1.31 — ABNORMAL LOW (ref 2.04–10.37)
Free Lambda Lt Chains,Ur: 25.4 mg/L — ABNORMAL HIGH (ref 0.24–6.66)
Free Lt Chn Excr Rate: 33.2 mg/L — ABNORMAL HIGH (ref 1.35–24.19)
GAMMA GLOBULIN URINE: 6.8 %
M-SPIKE %, Urine: 3.2 % — ABNORMAL HIGH
M-Spike, Mg/24 Hr: 92 mg/24 hr — ABNORMAL HIGH
Total Protein, Urine-Ur/day: 2885 mg/24 hr — ABNORMAL HIGH (ref 30–150)
Total Protein, Urine: 160.3 mg/dL

## 2016-05-18 LAB — IGG, IGA, IGM
IgA: 138 mg/dL (ref 61–437)
IgG (Immunoglobin G), Serum: 950 mg/dL (ref 700–1600)
IgM, Serum: 31 mg/dL (ref 20–172)

## 2016-05-18 LAB — PROTEIN ELECTROPHORESIS, SERUM
A/G Ratio: 0.9 (ref 0.7–1.7)
Albumin ELP: 2.7 g/dL — ABNORMAL LOW (ref 2.9–4.4)
Alpha-1-Globulin: 0.2 g/dL (ref 0.0–0.4)
Alpha-2-Globulin: 1 g/dL (ref 0.4–1.0)
Beta Globulin: 0.9 g/dL (ref 0.7–1.3)
Gamma Globulin: 0.9 g/dL (ref 0.4–1.8)
Globulin, Total: 3.1 g/dL (ref 2.2–3.9)
M-Spike, %: 0.5 g/dL — ABNORMAL HIGH
Total Protein ELP: 5.8 g/dL — ABNORMAL LOW (ref 6.0–8.5)

## 2016-05-18 LAB — KAPPA/LAMBDA LIGHT CHAINS
Kappa free light chain: 32.6 mg/L — ABNORMAL HIGH (ref 3.3–19.4)
Kappa, lambda light chain ratio: 0.45 (ref 0.26–1.65)
Lambda free light chains: 73.2 mg/L — ABNORMAL HIGH (ref 5.7–26.3)

## 2016-05-18 LAB — IMMUNOFIXATION, URINE

## 2016-05-19 DIAGNOSIS — Z981 Arthrodesis status: Secondary | ICD-10-CM | POA: Diagnosis not present

## 2016-05-19 LAB — IMMUNOFIXATION ELECTROPHORESIS
IgA: 137 mg/dL (ref 61–437)
IgG (Immunoglobin G), Serum: 965 mg/dL (ref 700–1600)
IgM, Serum: 37 mg/dL (ref 20–172)
Total Protein ELP: 5.8 g/dL — ABNORMAL LOW (ref 6.0–8.5)

## 2016-05-20 DIAGNOSIS — M47816 Spondylosis without myelopathy or radiculopathy, lumbar region: Secondary | ICD-10-CM | POA: Diagnosis not present

## 2016-05-21 DIAGNOSIS — M47816 Spondylosis without myelopathy or radiculopathy, lumbar region: Secondary | ICD-10-CM | POA: Diagnosis not present

## 2016-05-22 DIAGNOSIS — M4306 Spondylolysis, lumbar region: Secondary | ICD-10-CM | POA: Diagnosis not present

## 2016-05-23 ENCOUNTER — Ambulatory Visit (HOSPITAL_COMMUNITY): Payer: Medicare HMO | Admitting: Oncology

## 2016-05-23 DIAGNOSIS — M4306 Spondylolysis, lumbar region: Secondary | ICD-10-CM | POA: Diagnosis not present

## 2016-05-24 DIAGNOSIS — Z981 Arthrodesis status: Secondary | ICD-10-CM | POA: Diagnosis not present

## 2016-05-24 DIAGNOSIS — M4306 Spondylolysis, lumbar region: Secondary | ICD-10-CM | POA: Diagnosis not present

## 2016-05-24 DIAGNOSIS — I10 Essential (primary) hypertension: Secondary | ICD-10-CM | POA: Diagnosis not present

## 2016-05-24 DIAGNOSIS — E119 Type 2 diabetes mellitus without complications: Secondary | ICD-10-CM | POA: Diagnosis not present

## 2016-05-24 DIAGNOSIS — M47816 Spondylosis without myelopathy or radiculopathy, lumbar region: Secondary | ICD-10-CM | POA: Diagnosis not present

## 2016-05-25 DIAGNOSIS — M4306 Spondylolysis, lumbar region: Secondary | ICD-10-CM | POA: Diagnosis not present

## 2016-05-26 DIAGNOSIS — M47816 Spondylosis without myelopathy or radiculopathy, lumbar region: Secondary | ICD-10-CM | POA: Diagnosis not present

## 2016-05-26 DIAGNOSIS — R2689 Other abnormalities of gait and mobility: Secondary | ICD-10-CM | POA: Diagnosis not present

## 2016-05-26 DIAGNOSIS — M960 Pseudarthrosis after fusion or arthrodesis: Secondary | ICD-10-CM | POA: Diagnosis not present

## 2016-05-27 DIAGNOSIS — R2689 Other abnormalities of gait and mobility: Secondary | ICD-10-CM | POA: Diagnosis not present

## 2016-05-27 DIAGNOSIS — M960 Pseudarthrosis after fusion or arthrodesis: Secondary | ICD-10-CM | POA: Diagnosis not present

## 2016-05-27 DIAGNOSIS — M47816 Spondylosis without myelopathy or radiculopathy, lumbar region: Secondary | ICD-10-CM | POA: Diagnosis not present

## 2016-05-27 DIAGNOSIS — M4306 Spondylolysis, lumbar region: Secondary | ICD-10-CM | POA: Diagnosis not present

## 2016-05-28 DIAGNOSIS — M4306 Spondylolysis, lumbar region: Secondary | ICD-10-CM | POA: Diagnosis not present

## 2016-05-28 DIAGNOSIS — M960 Pseudarthrosis after fusion or arthrodesis: Secondary | ICD-10-CM | POA: Diagnosis not present

## 2016-05-28 DIAGNOSIS — R2689 Other abnormalities of gait and mobility: Secondary | ICD-10-CM | POA: Diagnosis not present

## 2016-05-28 DIAGNOSIS — M47816 Spondylosis without myelopathy or radiculopathy, lumbar region: Secondary | ICD-10-CM | POA: Diagnosis not present

## 2016-05-31 DIAGNOSIS — M47816 Spondylosis without myelopathy or radiculopathy, lumbar region: Secondary | ICD-10-CM | POA: Diagnosis not present

## 2016-05-31 DIAGNOSIS — I700 Atherosclerosis of aorta: Secondary | ICD-10-CM | POA: Diagnosis not present

## 2016-05-31 DIAGNOSIS — Z981 Arthrodesis status: Secondary | ICD-10-CM | POA: Diagnosis not present

## 2016-06-01 ENCOUNTER — Ambulatory Visit (HOSPITAL_COMMUNITY): Payer: Medicare HMO | Admitting: Oncology

## 2016-06-01 DIAGNOSIS — H04169 Lacrimal gland dislocation, unspecified lacrimal gland: Secondary | ICD-10-CM | POA: Insufficient documentation

## 2016-06-01 DIAGNOSIS — R011 Cardiac murmur, unspecified: Secondary | ICD-10-CM | POA: Insufficient documentation

## 2016-06-05 DIAGNOSIS — E65 Localized adiposity: Secondary | ICD-10-CM | POA: Diagnosis not present

## 2016-06-05 DIAGNOSIS — R011 Cardiac murmur, unspecified: Secondary | ICD-10-CM | POA: Diagnosis not present

## 2016-06-05 DIAGNOSIS — R69 Illness, unspecified: Secondary | ICD-10-CM | POA: Diagnosis not present

## 2016-06-05 DIAGNOSIS — H0589 Other disorders of orbit: Secondary | ICD-10-CM | POA: Diagnosis not present

## 2016-06-05 DIAGNOSIS — I071 Rheumatic tricuspid insufficiency: Secondary | ICD-10-CM | POA: Diagnosis not present

## 2016-06-05 DIAGNOSIS — H04162 Lacrimal gland dislocation, left lacrimal gland: Secondary | ICD-10-CM | POA: Diagnosis not present

## 2016-06-05 DIAGNOSIS — Z880 Allergy status to penicillin: Secondary | ICD-10-CM | POA: Diagnosis not present

## 2016-06-05 DIAGNOSIS — Z79899 Other long term (current) drug therapy: Secondary | ICD-10-CM | POA: Diagnosis not present

## 2016-06-05 DIAGNOSIS — I10 Essential (primary) hypertension: Secondary | ICD-10-CM | POA: Diagnosis not present

## 2016-06-05 DIAGNOSIS — I340 Nonrheumatic mitral (valve) insufficiency: Secondary | ICD-10-CM | POA: Diagnosis not present

## 2016-06-07 DIAGNOSIS — N182 Chronic kidney disease, stage 2 (mild): Secondary | ICD-10-CM | POA: Diagnosis not present

## 2016-06-07 DIAGNOSIS — R809 Proteinuria, unspecified: Secondary | ICD-10-CM | POA: Diagnosis not present

## 2016-06-12 ENCOUNTER — Encounter (HOSPITAL_BASED_OUTPATIENT_CLINIC_OR_DEPARTMENT_OTHER): Payer: Medicare HMO | Admitting: Oncology

## 2016-06-12 ENCOUNTER — Encounter (HOSPITAL_COMMUNITY): Payer: Self-pay | Admitting: Oncology

## 2016-06-12 VITALS — BP 108/59 | HR 58 | Temp 98.4°F | Resp 18 | Wt 264.0 lb

## 2016-06-12 DIAGNOSIS — D1779 Benign lipomatous neoplasm of other sites: Secondary | ICD-10-CM | POA: Diagnosis not present

## 2016-06-12 DIAGNOSIS — R769 Abnormal immunological finding in serum, unspecified: Secondary | ICD-10-CM | POA: Diagnosis not present

## 2016-06-12 DIAGNOSIS — Z720 Tobacco use: Secondary | ICD-10-CM | POA: Diagnosis not present

## 2016-06-12 DIAGNOSIS — D472 Monoclonal gammopathy: Secondary | ICD-10-CM

## 2016-06-12 DIAGNOSIS — R778 Other specified abnormalities of plasma proteins: Secondary | ICD-10-CM

## 2016-06-12 NOTE — Patient Instructions (Addendum)
[place] at [facility] Discharge Instructions  RECOMMENDATIONS MADE BY THE CONSULTANT AND ANY TEST RESULTS WILL BE SENT TO YOUR REFERRING PHYSICIAN.  You were seen by Gershon Mussel today. Return to clinic in 3 months with lab to see Dr. Whitney Muse Please call the center with any related concerns.  Thank you for choosing [place] at [facility] to provide your oncology and hematology care.  To afford each patient quality time with our provider, please arrive at least 15 minutes before your scheduled appointment time.   Beginning January 23rd 2017 lab work for the [facility] will be done in the  Main lab at [facility] on 1st floor. If you have a lab appointment with the [facility] please come in thru the  Main Entrance and check in at the main information desk  You need to re-schedule your appointment should you arrive 10 or more minutes late.  We strive to give you quality time with our providers, and arriving late affects you and other patients whose appointments are after yours.  Also, if you no show three or more times for appointments you may be dismissed from the clinic at the providers discretion.     Again, thank you for choosing [facility].  Our hope is that these requests will decrease the amount of time that you wait before being seen by our physicians.       _____________________________________________________________  Should you have questions after your visit to [facility], please contact our office at [phone] between the hours of 8:30 a.m. and 4:30 p.m.  Voicemails left after 4:30 p.m. will not be returned until the following business day.  For prescription refill requests, have your pharmacy contact our office.         Resources For Cancer Patients and their Caregivers ? American Cancer Society: Can assist with transportation, wigs, general needs, runs Look Good Feel Better.         6361964751 ? Cancer Care: Provides financial assistance, online support groups, medication/co-pay assistance.  1-800-813-HOPE 4050180025) ? [place] Assists [place] cancer patients and their families through emotional , educational and financial support.  913 322 0684 ? [place] DSS Where to apply for food stamps, Medicaid and utility assistance. (509)169-7351 ? [facility]: Transportation to medical appointments. (347) 103-3369 ? Social Security Administration: May apply for disability if have a Stage IV cancer. (661)653-0742 434 474 1433 ? [employer], Disability and Transit Services: Assists with nutrition, care and transit needs. [place] Support Programs: @10RELATIVEDAYS @ > Cancer Support Group  2nd Tuesday of the month 1pm-2pm, Journey Room  > Creative Journey  3rd Tuesday of the month 1130am-1pm, Journey Room  > Look Good Feel Better  1st Wednesday of the month 10am-12 noon, Journey Room (Call American Cancer Society to register [phone])[phone])     24-Hour Urine Collection HOW DO I DO A 24-HOUR URINE COLLECTION?  When you get up in the morning, urinate in the toilet and flush. Write down the time. This will be your start time on the day of collection and your end time on the next morning.  From then on, collect all of your urine in the plastic jug that is given to you.  Stop collecting your urine 24 hours after you started.  If the plastic jug that is given to you already has liquid in it, that is okay. Do not throw out the liquid or rinse out the  jug. Some tests need the liquid to be added to your urine.  Keep your plastic jug cool in an ice chest or keep it in the refrigerator during the test.  When 24 hours are over, bring your plastic jug to the clinic lab. Keep the jug cool in an ice chest while you are bringing it to the lab.   This information is not intended to replace advice given to you by your health  care provider. Make sure you discuss any questions you have with your health care provider.   Document Released: 01/26/2009 Document Revised: 11/20/2014 Document Reviewed: 03/25/2014 Elsevier Interactive Patient Education [employer].
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
## 2016-06-13 DIAGNOSIS — R69 Illness, unspecified: Secondary | ICD-10-CM | POA: Diagnosis not present

## 2016-06-13 DIAGNOSIS — I10 Essential (primary) hypertension: Secondary | ICD-10-CM | POA: Diagnosis not present

## 2016-06-13 DIAGNOSIS — Z79899 Other long term (current) drug therapy: Secondary | ICD-10-CM | POA: Diagnosis not present

## 2016-06-13 DIAGNOSIS — Z880 Allergy status to penicillin: Secondary | ICD-10-CM | POA: Diagnosis not present

## 2016-06-13 DIAGNOSIS — Z4889 Encounter for other specified surgical aftercare: Secondary | ICD-10-CM | POA: Diagnosis not present

## 2016-06-13 DIAGNOSIS — Z9889 Other specified postprocedural states: Secondary | ICD-10-CM | POA: Diagnosis not present

## 2016-06-14 DIAGNOSIS — Z1389 Encounter for screening for other disorder: Secondary | ICD-10-CM | POA: Diagnosis not present

## 2016-06-14 DIAGNOSIS — M79605 Pain in left leg: Secondary | ICD-10-CM | POA: Diagnosis not present

## 2016-06-14 DIAGNOSIS — Z6836 Body mass index (BMI) 36.0-36.9, adult: Secondary | ICD-10-CM | POA: Diagnosis not present

## 2016-06-14 DIAGNOSIS — E6609 Other obesity due to excess calories: Secondary | ICD-10-CM | POA: Diagnosis not present

## 2016-06-15 DIAGNOSIS — I10 Essential (primary) hypertension: Secondary | ICD-10-CM | POA: Diagnosis not present

## 2016-06-15 DIAGNOSIS — Z0000 Encounter for general adult medical examination without abnormal findings: Secondary | ICD-10-CM | POA: Diagnosis not present

## 2016-06-16 DIAGNOSIS — Z6836 Body mass index (BMI) 36.0-36.9, adult: Secondary | ICD-10-CM | POA: Diagnosis not present

## 2016-06-16 DIAGNOSIS — D179 Benign lipomatous neoplasm, unspecified: Secondary | ICD-10-CM | POA: Diagnosis not present

## 2016-06-22 DIAGNOSIS — R221 Localized swelling, mass and lump, neck: Secondary | ICD-10-CM | POA: Diagnosis not present

## 2016-07-04 DIAGNOSIS — E6609 Other obesity due to excess calories: Secondary | ICD-10-CM | POA: Diagnosis not present

## 2016-07-04 DIAGNOSIS — D179 Benign lipomatous neoplasm, unspecified: Secondary | ICD-10-CM | POA: Diagnosis not present

## 2016-07-04 DIAGNOSIS — Z6836 Body mass index (BMI) 36.0-36.9, adult: Secondary | ICD-10-CM | POA: Diagnosis not present

## 2016-07-05 NOTE — Patient Instructions (Signed)
Gregory Diaz  07/05/2016     @PREFPERIOPPHARMACY @   Your procedure is scheduled on   07/11/2016   Report to [facility] at  60  A.M.  Call this number if you have problems the morning of surgery:  [phone]   Remember:  Do not eat food or drink liquids after midnight.  Take these medicines the morning of surgery with A SIP OF WATER  Lisinopril, robaxin, metalazone, oxycodone, prednisone, zantac.   Do not wear jewelry, make-up or nail polish.  Do not wear lotions, powders, or perfumes, or deoderant.  Do not shave 48 hours prior to surgery.  Men may shave face and neck.  Do not bring valuables to the hospital.  [facility] is not responsible for any belongings or valuables.  Contacts, dentures or bridgework may not be worn into surgery.  Leave your suitcase in the car.  After surgery it may be brought to your room.  For patients admitted to the hospital, discharge time will be determined by your treatment team.  Patients discharged the day of surgery will not be allowed to drive home.   Name and phone number of your driver:   family Special instructions:  none  Please read over the following fact sheets that you were given. Anesthesia Post-op Instructions and Care and Recovery After Surgery       Incision Care An incision is when a surgeon cuts into your body. After surgery, the incision needs to be cared for properly to prevent infection.  HOW TO CARE FOR YOUR INCISION  Take medicines only as directed by your health care provider.  There are many different ways to close and cover an incision, including stitches, skin glue, and adhesive strips. Follow your health care provider's instructions on:  Incision care.  Bandage (dressing) changes and removal.  Incision closure removal.  Do not take baths, swim, or use a hot tub until your health care provider approves. You may shower as directed by your health care provider.  Resume your normal diet and  activities as directed.  Use anti-itch medicine (such as an antihistamine) as directed by your health care provider. The incision may itch while it is healing. Do not pick or scratch at the incision.  Drink enough fluid to keep your urine clear or pale yellow. SEEK MEDICAL CARE IF:   You have drainage, redness, swelling, or pain at your incision site.  You have muscle aches, chills, or a general ill feeling.  You notice a bad smell coming from the incision or dressing.  Your incision edges separate after the sutures, staples, or skin adhesive strips have been removed.  You have persistent nausea or vomiting.  You have a fever.  You are dizzy. SEEK IMMEDIATE MEDICAL CARE IF:   You have a rash.  You faint.  You have difficulty breathing. MAKE SURE YOU:   Understand these instructions.  Will watch your condition.  Will get help right away if you are not doing well or get worse.   This information is not intended to replace advice given to you by your health care provider. Make sure you discuss any questions you have with your health care provider.   Document Released: 05/19/2005 Document Revised: 11/20/2014 Document Reviewed: 12/24/2013 Elsevier Interactive Patient Education 2016 [place]  An incision (cut) is when a surgeon cuts into your body. After surgery, the cut needs to be well cared for to keep it  from getting infected.  HOW TO CARE FOR YOUR CUT  Take medicines only as told by your doctor.  There are many different ways to close and cover a cut, including stitches, skin glue, and adhesive strips. Follow your doctor's instructions on:  Care of the cut.  Bandage (dressing) changes and removal.  Cut closure removal.  Do not take baths, swim, or use a hot tub until your doctor says it is okay. You may shower as told by your doctor.  Return to your normal diet and activities as allowed by your doctor.  Use medicine that helps lessen  itching on your cut as told by your doctor. Do not pick or scratch at your cut.  Drink enough fluids to keep your pee (urine) clear or pale yellow. GET HELP IF:  You have redness, puffiness (swelling), or pain at the site of your cut.  You have fluid, blood, or pus coming from your cut.  Your muscles ache.  You have chills or you feel sick.  You have a bad smell coming from the cut or bandage.  Your cut opens up after stitches, staples, or adhesive strips have been removed.  You keep feeling sick to your stomach (nauseous) or keep throwing up (vomiting).  You have a fever.  You are dizzy. GET HELP RIGHT AWAY IF:  You have a rash.  You pass out (faint).  You have trouble breathing. MAKE SURE YOU:   Understand these instructions.  Will watch your condition.  Will get help right away if you are not doing well or get worse.   This information is not intended to replace advice given to you by your health care provider. Make sure you discuss any questions you have with your health care provider.   Document Released: 01/22/2012 Document Revised: 11/20/2014 Document Reviewed: 12/24/2013 Elsevier Interactive Patient Education 2016 [employer] PATIENT INSTRUCTIONS POST-ANESTHESIA  IMMEDIATELY FOLLOWING SURGERY:  Do not drive or operate machinery for the first twenty four hours after surgery.  Do not make any important decisions for twenty four hours after surgery or while taking narcotic pain medications or sedatives.  If you develop intractable nausea and vomiting or a severe headache please notify your doctor immediately.  FOLLOW-UP:  Please make an appointment with your surgeon as instructed. You do not need to follow up with anesthesia unless specifically instructed to do so.  WOUND CARE INSTRUCTIONS (if applicable):  Keep a dry clean dressing on the anesthesia/puncture wound site if there is drainage.  Once the wound has quit draining you may leave it open to air.   Generally you should leave the bandage intact for twenty four hours unless there is drainage.  If the epidural site drains for more than 36-48 hours please call the anesthesia department.  QUESTIONS?:  Please feel free to call your physician or the hospital operator if you have any questions, and they will be happy to assist you.

## 2016-07-06 ENCOUNTER — Encounter (HOSPITAL_COMMUNITY)
Admission: RE | Admit: 2016-07-06 | Discharge: 2016-07-06 | Disposition: A | Discharge status: Home / Self Care | Payer: Medicare HMO | Source: Ambulatory Visit | Attending: Surgery | Admitting: Surgery

## 2016-07-06 ENCOUNTER — Encounter (HOSPITAL_COMMUNITY): Payer: Self-pay

## 2016-07-06 DIAGNOSIS — Z01812 Encounter for preprocedural laboratory examination: Secondary | ICD-10-CM | POA: Diagnosis not present

## 2016-07-06 DIAGNOSIS — F419 Anxiety disorder, unspecified: Secondary | ICD-10-CM | POA: Diagnosis not present

## 2016-07-06 DIAGNOSIS — I10 Essential (primary) hypertension: Secondary | ICD-10-CM | POA: Diagnosis not present

## 2016-07-06 DIAGNOSIS — N182 Chronic kidney disease, stage 2 (mild): Secondary | ICD-10-CM | POA: Diagnosis not present

## 2016-07-06 DIAGNOSIS — I509 Heart failure, unspecified: Secondary | ICD-10-CM | POA: Insufficient documentation

## 2016-07-06 DIAGNOSIS — R809 Proteinuria, unspecified: Secondary | ICD-10-CM | POA: Diagnosis not present

## 2016-07-06 DIAGNOSIS — R69 Illness, unspecified: Secondary | ICD-10-CM | POA: Diagnosis not present

## 2016-07-06 LAB — BASIC METABOLIC PANEL
Anion gap: 3 — ABNORMAL LOW (ref 5–15)
BUN: 15 mg/dL (ref 6–20)
CO2: 25 mmol/L (ref 22–32)
Calcium: 8.4 mg/dL — ABNORMAL LOW (ref 8.9–10.3)
Chloride: 110 mmol/L (ref 101–111)
Creatinine, Ser: 1.21 mg/dL (ref 0.61–1.24)
GFR calc Af Amer: >60 mL/min (ref >60)
GFR calc non Af Amer: >60 mL/min (ref >60)
Glucose, Bld: 99 mg/dL (ref 65–99)
Potassium: 4 mmol/L (ref 3.5–5.1)
Sodium: 138 mmol/L (ref 135–145)

## 2016-07-06 LAB — CBC
HCT: 39.7 % (ref 39.0–52.0)
Hemoglobin: 12.9 g/dL — ABNORMAL LOW (ref 13.0–17.0)
MCH: 27.4 pg (ref 26.0–34.0)
MCHC: 32.5 g/dL (ref 30.0–36.0)
MCV: 84.5 fL (ref 78.0–100.0)
Platelets: 264 K/uL (ref 150–400)
RBC: 4.7 MIL/uL (ref 4.22–5.81)
RDW: 13.4 % (ref 11.5–15.5)
WBC: 8.9 K/uL (ref 4.0–10.5)

## 2016-07-10 MED ORDER — SODIUM CHLORIDE 0.9 % IV SOLN: 1500.0000 mg | INTRAVENOUS | Status: AC

## 2016-07-10 MED ORDER — CHLORHEXIDINE GLUCONATE CLOTH 2 % EX PADS: 6.0000 | MEDICATED_PAD | Freq: Once | CUTANEOUS | Status: AC

## 2016-07-11 ENCOUNTER — Encounter (HOSPITAL_COMMUNITY): Payer: Self-pay | Admitting: *Deleted

## 2016-07-11 ENCOUNTER — Ambulatory Visit (HOSPITAL_COMMUNITY): Payer: Medicare HMO | Admitting: Anesthesiology

## 2016-07-11 ENCOUNTER — Ambulatory Visit (HOSPITAL_COMMUNITY)
Admission: RE | Admit: 2016-07-11 | Discharge: 2016-07-11 | Disposition: A | Discharge status: Home / Self Care | Payer: Medicare HMO | Source: Ambulatory Visit | Attending: Surgery | Admitting: Surgery

## 2016-07-11 ENCOUNTER — Encounter (HOSPITAL_COMMUNITY)
Admission: RE | Disposition: A | Discharge status: Home / Self Care | Payer: Self-pay | Source: Ambulatory Visit | Attending: Surgery

## 2016-07-11 DIAGNOSIS — K219 Gastro-esophageal reflux disease without esophagitis: Secondary | ICD-10-CM | POA: Insufficient documentation

## 2016-07-11 DIAGNOSIS — Z79899 Other long term (current) drug therapy: Secondary | ICD-10-CM | POA: Diagnosis not present

## 2016-07-11 DIAGNOSIS — J449 Chronic obstructive pulmonary disease, unspecified: Secondary | ICD-10-CM | POA: Insufficient documentation

## 2016-07-11 DIAGNOSIS — M5410 Radiculopathy, site unspecified: Secondary | ICD-10-CM | POA: Insufficient documentation

## 2016-07-11 DIAGNOSIS — D1721 Benign lipomatous neoplasm of skin and subcutaneous tissue of right arm: Secondary | ICD-10-CM | POA: Diagnosis not present

## 2016-07-11 DIAGNOSIS — F17200 Nicotine dependence, unspecified, uncomplicated: Secondary | ICD-10-CM | POA: Diagnosis not present

## 2016-07-11 DIAGNOSIS — E119 Type 2 diabetes mellitus without complications: Secondary | ICD-10-CM | POA: Insufficient documentation

## 2016-07-11 DIAGNOSIS — Z7984 Long term (current) use of oral hypoglycemic drugs: Secondary | ICD-10-CM | POA: Diagnosis not present

## 2016-07-11 DIAGNOSIS — D1722 Benign lipomatous neoplasm of skin and subcutaneous tissue of left arm: Secondary | ICD-10-CM | POA: Diagnosis not present

## 2016-07-11 DIAGNOSIS — R69 Illness, unspecified: Secondary | ICD-10-CM | POA: Diagnosis not present

## 2016-07-11 DIAGNOSIS — D179 Benign lipomatous neoplasm, unspecified: Secondary | ICD-10-CM | POA: Diagnosis not present

## 2016-07-11 DIAGNOSIS — G8929 Other chronic pain: Secondary | ICD-10-CM | POA: Diagnosis not present

## 2016-07-11 DIAGNOSIS — M1990 Unspecified osteoarthritis, unspecified site: Secondary | ICD-10-CM | POA: Insufficient documentation

## 2016-07-11 DIAGNOSIS — M549 Dorsalgia, unspecified: Secondary | ICD-10-CM | POA: Insufficient documentation

## 2016-07-11 HISTORY — PX: MASS EXCISION: SHX2000

## 2016-07-11 LAB — GLUCOSE, CAPILLARY
Glucose-Capillary: 106 mg/dL — ABNORMAL HIGH (ref 65–99)
Glucose-Capillary: 104 mg/dL — ABNORMAL HIGH (ref 65–99)

## 2016-07-11 SURGERY — EXCISION MASS
Anesthesia: General | Site: Shoulder | Laterality: Right

## 2016-07-11 MED ORDER — EPHEDRINE SULFATE 50 MG/ML IJ SOLN
INTRAMUSCULAR | Status: DC | PRN
  Administered 2016-07-11 (×2): 10 mg via INTRAVENOUS

## 2016-07-11 MED ORDER — LIDOCAINE HCL (PF) 1 % IJ SOLN
INTRAMUSCULAR | Status: AC
  Filled 2016-07-11: qty 30

## 2016-07-11 MED ORDER — MIDAZOLAM HCL 2 MG/2ML IJ SOLN
1.0000 mg | INTRAMUSCULAR | Status: DC | PRN
  Administered 2016-07-11: 2 mg via INTRAVENOUS
  Filled 2016-07-11: qty 2

## 2016-07-11 MED ORDER — FENTANYL CITRATE (PF) 100 MCG/2ML IJ SOLN
INTRAMUSCULAR | Status: AC
  Filled 2016-07-11: qty 4

## 2016-07-11 MED ORDER — SUCCINYLCHOLINE CHLORIDE 20 MG/ML IJ SOLN
INTRAMUSCULAR | Status: DC | PRN
  Administered 2016-07-11: 150 mg via INTRAVENOUS

## 2016-07-11 MED ORDER — SUCCINYLCHOLINE CHLORIDE 20 MG/ML IJ SOLN
INTRAMUSCULAR | Status: AC
  Filled 2016-07-11: qty 1

## 2016-07-11 MED ORDER — ROCURONIUM BROMIDE 100 MG/10ML IV SOLN
INTRAVENOUS | Status: DC | PRN
  Administered 2016-07-11: 5 mg via INTRAVENOUS

## 2016-07-11 MED ORDER — ROCURONIUM BROMIDE 50 MG/5ML IV SOLN
INTRAVENOUS | Status: AC
  Filled 2016-07-11: qty 1

## 2016-07-11 MED ORDER — BUPIVACAINE HCL (PF) 0.5 % IJ SOLN
INTRAMUSCULAR | Status: AC
  Filled 2016-07-11: qty 30

## 2016-07-11 MED ORDER — FENTANYL CITRATE (PF) 100 MCG/2ML IJ SOLN
INTRAMUSCULAR | Status: DC | PRN
  Administered 2016-07-11 (×2): 50 ug via INTRAVENOUS

## 2016-07-11 MED ORDER — LIDOCAINE HCL (PF) 1 % IJ SOLN
INTRAMUSCULAR | Status: AC
  Filled 2016-07-11: qty 5

## 2016-07-11 MED ORDER — EPHEDRINE SULFATE 50 MG/ML IJ SOLN
INTRAMUSCULAR | Status: AC
  Filled 2016-07-11: qty 1

## 2016-07-11 MED ORDER — MIDAZOLAM HCL 5 MG/5ML IJ SOLN
INTRAMUSCULAR | Status: DC | PRN
  Administered 2016-07-11: 2 mg via INTRAVENOUS

## 2016-07-11 MED ORDER — LIDOCAINE HCL 1 % IJ SOLN
INTRAMUSCULAR | Status: DC | PRN
  Administered 2016-07-11: 40 mg via INTRADERMAL

## 2016-07-11 MED ORDER — SODIUM CHLORIDE 0.9 % IR SOLN
Status: DC | PRN
  Administered 2016-07-11: 500 mL

## 2016-07-11 MED ORDER — DEXTROSE 5 % IV SOLN
INTRAVENOUS | Status: DC | PRN
  Administered 2016-07-11: 09:00:00 via INTRAVENOUS

## 2016-07-11 MED ORDER — GLYCOPYRROLATE 0.2 MG/ML IJ SOLN
0.2000 mg | Freq: Once | INTRAMUSCULAR | Status: AC
Start: 2016-07-11 — End: 2016-07-11
  Administered 2016-07-11: 0.2 mg via INTRAVENOUS
  Filled 2016-07-11: qty 1

## 2016-07-11 MED ORDER — SODIUM CHLORIDE 0.9 % IJ SOLN
INTRAMUSCULAR | Status: AC
  Filled 2016-07-11: qty 10

## 2016-07-11 MED ORDER — PROPOFOL 10 MG/ML IV BOLUS
INTRAVENOUS | Status: DC | PRN
  Administered 2016-07-11: 160 mg via INTRAVENOUS

## 2016-07-11 MED ORDER — HYDROMORPHONE HCL 1 MG/ML IJ SOLN: 0.2500 mg | INTRAMUSCULAR | Status: DC | PRN

## 2016-07-11 MED ORDER — PROPOFOL 10 MG/ML IV BOLUS
INTRAVENOUS | Status: AC
  Filled 2016-07-11: qty 20

## 2016-07-11 MED ORDER — LIDOCAINE HCL 1 % IJ SOLN
INTRAMUSCULAR | Status: DC | PRN
  Administered 2016-07-11: 18 mL via INTRAMUSCULAR

## 2016-07-11 MED ORDER — VANCOMYCIN HCL 10 G IV SOLR
1500.0000 mg | INTRAVENOUS | Status: AC
  Administered 2016-07-11: 1500 mg via INTRAVENOUS
  Filled 2016-07-11 (×2): qty 1500

## 2016-07-11 MED ORDER — MIDAZOLAM HCL 2 MG/2ML IJ SOLN
INTRAMUSCULAR | Status: AC
  Filled 2016-07-11: qty 2

## 2016-07-11 MED ORDER — LACTATED RINGERS IV SOLN
INTRAVENOUS | Status: DC
  Administered 2016-07-11: 08:00:00 via INTRAVENOUS

## 2016-07-11 MED ORDER — ONDANSETRON HCL 4 MG/2ML IJ SOLN
4.0000 mg | Freq: Once | INTRAMUSCULAR | Status: AC
  Administered 2016-07-11: 4 mg via INTRAVENOUS
  Filled 2016-07-11: qty 2

## 2016-07-11 SURGICAL SUPPLY — 33 items
BAG HAMPER (MISCELLANEOUS) ×2 IMPLANT
BLADE SURG 15 STRL LF DISP TIS (BLADE) ×1 IMPLANT
BLADE SURG 15 STRL SS (BLADE) ×1
CHLORAPREP W/TINT 10.5 ML (MISCELLANEOUS) ×2 IMPLANT
CHLORAPREP W/TINT 26ML (MISCELLANEOUS) ×2 IMPLANT
CLOTH BEACON ORANGE TIMEOUT ST (SAFETY) ×2 IMPLANT
COVER LIGHT HANDLE STERIS (MISCELLANEOUS) ×4 IMPLANT
DECANTER SPIKE VIAL GLASS SM (MISCELLANEOUS) ×4 IMPLANT
DERMABOND ADVANCED (GAUZE/BANDAGES/DRESSINGS) ×1
DERMABOND ADVANCED .7 DNX12 (GAUZE/BANDAGES/DRESSINGS) ×1 IMPLANT
ELECT REM PT RETURN 9FT ADLT (ELECTROSURGICAL) ×2
ELECTRODE REM PT RTRN 9FT ADLT (ELECTROSURGICAL) ×1 IMPLANT
FORMALIN 10 PREFIL 120ML (MISCELLANEOUS) IMPLANT
FORMALIN 10 PREFIL 480ML (MISCELLANEOUS) ×2 IMPLANT
GLOVE BIOGEL PI IND STRL 7.0 (GLOVE) ×1 IMPLANT
GLOVE BIOGEL PI IND STRL 7.5 (GLOVE) ×1 IMPLANT
GLOVE BIOGEL PI INDICATOR 7.0 (GLOVE) ×1
GLOVE BIOGEL PI INDICATOR 7.5 (GLOVE) ×1
GLOVE ECLIPSE 7.0 STRL STRAW (GLOVE) ×2 IMPLANT
GOWN STRL REUS W/ TWL LRG LVL3 (GOWN DISPOSABLE) ×2 IMPLANT
GOWN STRL REUS W/TWL LRG LVL3 (GOWN DISPOSABLE) ×6 IMPLANT
KIT ROOM TURNOVER APOR (KITS) ×2 IMPLANT
MANIFOLD NEPTUNE II (INSTRUMENTS) ×2 IMPLANT
NEEDLE HYPO 25X1 1.5 SAFETY (NEEDLE) ×2 IMPLANT
NS IRRIG 1000ML POUR BTL (IV SOLUTION) ×2 IMPLANT
PACK MINOR (CUSTOM PROCEDURE TRAY) ×2 IMPLANT
PAD ARMBOARD 7.5X6 YLW CONV (MISCELLANEOUS) ×2 IMPLANT
SET BASIN LINEN APH (SET/KITS/TRAYS/PACK) ×2 IMPLANT
SPONGE LAP 18X18 X RAY DECT (DISPOSABLE) ×2 IMPLANT
SUT VIC AB 3-0 SH 27 (SUTURE)
SUT VIC AB 3-0 SH 27X BRD (SUTURE) IMPLANT
SUT VIC AB 4-0 PS2 27 (SUTURE) ×2 IMPLANT
SYR CONTROL 10ML LL (SYRINGE) ×2 IMPLANT

## 2016-07-11 NOTE — Anesthesia Preprocedure Evaluation (Signed)
Anesthesia Evaluation  Patient identified by MRN, date of birth, ID band Patient awake    Reviewed: Allergy & Precautions, NPO status , Patient's Chart, lab work & pertinent test results  History of Anesthesia Complications Negative for: history of anesthetic complications  Airway Mallampati: II  TM Distance: >3 FB Neck ROM: Full    Dental  (+) Dental Advisory Given, Teeth Intact   Pulmonary COPD, Current Smoker,    breath sounds clear to auscultation       Cardiovascular hypertension, Pt. on medications (-) angina Rhythm:Regular Rate:Normal       Neuro/Psych Anxiety Chronic back pain  Neuromuscular disease ( Radiculopathy of leg) negative psych ROS   GI/Hepatic negative GI ROS,   Endo/Other  Morbid obesity  Renal/GU Renal disease     Musculoskeletal  (+) Arthritis , Osteoarthritis,    Abdominal (+) + obese,   Peds  Hematology   Anesthesia Other Findings   Reproductive/Obstetrics                             Anesthesia Physical Anesthesia Plan  ASA: III  Anesthesia Plan: General   Post-op Pain Management:    Induction: Intravenous  Airway Management Planned: Oral ETT  Additional Equipment:   Intra-op Plan:   Post-operative Plan: Extubation in OR  Informed Consent: I have reviewed the patients History and Physical, chart, labs and discussed the procedure including the risks, benefits and alternatives for the proposed anesthesia with the patient or authorized representative who has indicated his/her understanding and acceptance.   Dental advisory given  Plan Discussed with: CRNA and Surgeon  Anesthesia Plan Comments: (LLD position)        Anesthesia Quick Evaluation

## 2016-07-11 NOTE — Transfer of Care (Signed)
Immediate Anesthesia Transfer of Care Note  Patient: Gregory Diaz  Procedure(s) Performed: Procedure(s): EXCISION 6CM RIGHT SHOULDER MASS (Right)  Patient Location: PACU  Anesthesia Type:General  Level of Consciousness: awake and patient cooperative  Airway & Oxygen Therapy: Patient Spontanous Breathing and Patient connected to face mask oxygen  Post-op Assessment: Report given to RN, Post -op Vital signs reviewed and stable and Patient moving all extremities  Post vital signs: Reviewed and stable  Last Vitals:  Vitals:   07/11/16 0845 07/11/16 0850  BP: 116/69   Resp: 15 12  Temp:      Last Pain:  Vitals:   07/11/16 0733  TempSrc: Oral      Patients Stated Pain Goal: 2 ([id number])  Complications: No apparent anesthesia complications

## 2016-07-11 NOTE — H&P (Signed)
Surgical History and Physical  Subjective: 65 year old male presents foran enlarged Right shoulder mass that he reports has been there for several years, but over the past year it seems to have grown significantly in size. He expresses that he is concerned about it being a cancer, but it also causes him discomfort, his shirts don't fit him properly, and he dislikes that it is the first feature people notice about him and fixate on it. Patient denies any prior cancers, similar skin lesions on his back, arms, legs, or otherwise, and denies any fever/chills, weight loss, CP or SOB.  Review of Symptoms:  Constitutional:No fevers, chills, or unexplained weight loss Head:Atraumatic; no masses; no abnormalities Eyes:No visual changes or eye pain Cardiovascular: No chest pain or palpitations.  Respiratory:No cough, shortness of breath or wheezing  GastrointestinNo diarrhea, constipation, blood in stools, abdominal pain, vomiting or heartburn Musculoskeletal:No arthalgias, myalgias or joint swelling Skin:No rash or bothersome skin lesions except as per HPI   Past Medical History:Reviewed  Past Medical History  Surgical History:  Spine surgeries x5 ([facility]) Medical Problems:  Diabetes mellitus, HTH, GERD Allergies: NKDA   Medications: Januvia, lisinopril, Metolazone, Prilosec OTC, Zantac, acyclovir     Social History:Reviewed  Social History  Preferred Language: English Race:  Black or African American Ethnicity: Not Hispanic / Latino Age: 58 year Marital Status:  M   Smoking Status: Current every day smoker reviewed on 07/09/2016 Started Date: [birth year]04/1963 Packs per day: 1.00  Functional Status ------------------------------------------------ Bathing: Normal Cooking: Normal Dressing: Normal Driving: Normal Eating: Normal Managing Meds: Normal Oral Care: Normal Shopping: Normal Toileting: Normal Transferring: Normal Walking: Normal  Cognitive  Status ------------------------------------------------ Attention: Normal Decision Making: Normal Language: Normal Memory: Normal Motor: Normal Perception: Normal Problem Solving: Normal Visual and Spatial: Normal   Family History:Reviewed  Family Health History Mother and Father deceased (mother - Oct 31, 2015, father 03/1977)  Vital Signs as of [id number]:  Systolic Q000111Q: Diastolic 84: Heart Rate 55: Temp 98.32F (Temporal)  Height 28ft 2in: Weight 266Lbs 0 Ounces   BMI : 34.15 kg/m2   Objective Information: Skin: Right shoulder (suprascapular) 6 cm soft/spongy non-tender large mobile/non-fixed subcutaneous mass, no rash Head:Atraumatic; no masses; no abnormalities Eyes:conjunctiva clear, EOM intact, PERRL Heart:RRR, no murmur Lungs:CTA bilaterally, no wheezes, rhonchi, rales.  Breathing unlabored. Abdomen:Soft, NT/ND, no HSM, no masses. Extremities:No deformities, clubbing, cyanosis, or edema.   Assessment: Obese 65 year old Male with enlarging, bothersome Right suprascapular neoplasm, likely a symptomatic lipoma, complicated by tobacco abuse.  Diagnosis & Procedure Smart Code   Plan:       - all risks, benefits, and alternatives to excision of Right suprascapular mass, likely lipoma, discussed and all of patient's questions were answered to his expressed satisfaction       - weight loss and smoking cessation discussed extensively with patient, all of his questions were answered to his expressed satisfaction, and informed consent was obtained       - will plan for elective outpatient excision of Right suprascapular mass (likely lipoma)       - surgical follow-up 2 weeks after planned surgery  -- Corene Cornea E. Rosana Hoes, MD, [place] Office #: [phone]

## 2016-07-11 NOTE — Interval H&P Note (Signed)
History and Physical Interval Note:  07/11/2016 7:29 AM  Gregory Diaz  has presented today for surgery, with the diagnosis of lipoma  The various methods of treatment have been discussed with the patient and family. After consideration of risks, benefits and other options for treatment, the patient has consented to  Procedure(s): EXCISION 6CM RIGHT SHOULDER MASS (Right) as a surgical intervention .  The patient's history has been reviewed, patient examined, no change in status, stable for surgery.  I have reviewed the patient's chart and labs.  Questions were answered to the patient's satisfaction.     Vickie Epley

## 2016-07-11 NOTE — Anesthesia Procedure Notes (Signed)
Procedure Name: Intubation Performed by: Charmaine Downs Pre-anesthesia Checklist: Patient identified, Patient being monitored, Timeout performed, Emergency Drugs available and Suction available Patient Re-evaluated:Patient Re-evaluated prior to inductionOxygen Delivery Method: Circle System Utilized Preoxygenation: Pre-oxygenation with 100% oxygen Intubation Type: IV induction and Cricoid Pressure applied Ventilation: Mask ventilation without difficulty Laryngoscope Size: Mac and 3 Grade View: Grade II Tube type: Oral Tube size: 8.0 mm Number of attempts: 1 Airway Equipment and Method: stylet Placement Confirmation: ETT inserted through vocal cords under direct vision,  positive ETCO2 and breath sounds checked- equal and bilateral Secured at: 24 cm Tube secured with: Tape Dental Injury: Teeth and Oropharynx as per pre-operative assessment

## 2016-07-11 NOTE — Anesthesia Postprocedure Evaluation (Signed)
Anesthesia Post Note  Patient: Gregory Diaz  Procedure(s) Performed: Procedure(s) (LRB): EXCISION 6CM RIGHT SHOULDER MASS (Right)  Patient location during evaluation: PACU Anesthesia Type: General Level of consciousness: awake and alert, oriented and patient cooperative Pain management: pain level controlled Vital Signs Assessment: post-procedure vital signs reviewed and stable Respiratory status: spontaneous breathing, nonlabored ventilation and respiratory function stable Cardiovascular status: blood pressure returned to baseline and stable Postop Assessment: no signs of nausea or vomiting Anesthetic complications: no    Last Vitals:  Vitals:   07/11/16 1100 07/11/16 1115  BP: 123/75   Pulse: (!) 55 60  Resp: 11 15  Temp:      Last Pain:  Vitals:   07/11/16 1045  TempSrc:   PainSc: Asleep                 [name] J

## 2016-07-11 NOTE — Discharge Instructions (Signed)
In addition to included general post-operative instructions for Excision of Right Subcutaneous Shoulder mass,  Diet: Resume home heart healthy diabetic diet.   Activity: No heavy lifting (children, pets, laundry) or strenuous activity until follow-up, but light activity and walking are encouraged. Do not drive or drink alcohol if taking narcotic pain medications.   Wound care: 2 days after surgery (Thursday, 8/31), may shower/get incision wet with soapy water and pat dry (do not rub incisions), but no baths or submerging incision underwater until follow-up.   Medications: Resume all home medications. For mild to moderate pain: acetaminophen (Tylenol) or ibuprofen (if no kidney disease). Narcotic pain medications, if prescribed, can be used for severe pain, though may cause nausea, constipation, and drowsiness. Do not combine Tylenol and Percocet within a 6 hour period as Percocet contains Tylenol. If you do not need the narcotic pain medication, you do not need to fill the prescription.  Call office [phone]) at any time if any questions, worsening pain, fevers/chills, bleeding, drainage from incision site, or other concerns.  * SMOKING CESSATION STRONGLY ENCOURAGED *

## 2016-07-11 NOTE — Op Note (Signed)
SURGICAL OPERATIVE REPORT  DATE OF PROCEDURE: 07/11/2016  ATTENDING Surgeon(s): Vickie Epley, MD  ANESTHESIA: Local with MAC (monitored anesthesia care)  PRE-OPERATIVE DIAGNOSIS: Symptomatic enlarging Right suprascapular subcutaneous fatty mass (icd-10: D17.9)  POST-OPERATIVE DIAGNOSIS: Symptomatic enlarging Right suprascapular subcutaneous fatty mass (icd-10: D17.9)  PROCEDURE(S):  1.) Excision of 6 cm enlarging Right suprascapular subcutaneous fatty mass (cpt: 21930)  INTRAOPERATIVE FINDINGS: Right suprascapular 5.5 cm x 5.5 cm subcutaneous fatty mass  INTRAVENOUS FLUIDS: 1000 mL crystalloid (including 500 mL vancomycin)  ESTIMATED BLOOD LOSS: Minimal (< 20 mL)  URINE OUTPUT: No Foley  SPECIMENS: Right suprascapular 5.5 cm x 5.5 cm subcutaneous fatty mass (likely lipoma)  IMPLANTS: None  DRAINS: None  COMPLICATIONS: None apparent  CONDITION AT END OF PROCEDURE: Hemodynamically stable and awake  DISPOSITION OF PATIENT: PACU  INDICATIONS FOR PROCEDURE:  Patient is a 64 y.o. male who presented for an enlarging 6 cm Right suprascapular posterior shoulder subcutaneous mass that he reports causes him significant discomfort and makes some of his clothing not fit. All risks, benefits, and alternatives to above procedure were discussed with the patient, all of patient's questions were answered to his expressed satisfaction, and informed consent was obtained and documented.  DETAILS OF PROCEDURE: Patient was brought to the operating suite and appropriately identified. Anesthesia was induced by anesthetist, and patient was positioned laying with his Left side down. Operative site was prepped and draped in the usual sterile fashion, and following a brief time out, an ~6 cm long transverse linear incision was made using a #15 blade scalpel, and incision was extended deep through subcutaneous tissue until fatty tumor was encountered. A combination of blunt, sharp, and  electrocautery dissection was used to free the mass from surrounding tissue and muscle. However, it appeared to extend deep to the suprascapular fascia of the Right shoulder, and this fascia underlying the large otherwise freed fatty mass was excised intact along with the fatty tumor, which was handed off the field as specimen for pathology processing and analysis. Hemostasis was achieved, and the wound was copiously irrigated with sterile warm saline. Deep tissue and dermis were re-approximated using buried interrupted 3-0 Vicryl suture, and running subcuticular 4-0 Vicryl suture was used to re-approximate epidermis. Skin was then cleaned and dried, and sterile Dermabond skin glue was applied to the wound. Patient was then safely transferred to PACU for post-operative monitoring and care.   I was present for all aspects of the above procedure, and there were no complications apparent.

## 2016-07-13 ENCOUNTER — Encounter (HOSPITAL_COMMUNITY): Payer: Self-pay | Admitting: Surgery

## 2016-07-24 ENCOUNTER — Other Ambulatory Visit (HOSPITAL_COMMUNITY): Payer: Self-pay | Admitting: Neurosurgery

## 2016-07-24 ENCOUNTER — Ambulatory Visit (HOSPITAL_COMMUNITY): Payer: Medicare HMO

## 2016-07-24 DIAGNOSIS — M47816 Spondylosis without myelopathy or radiculopathy, lumbar region: Secondary | ICD-10-CM

## 2016-07-25 ENCOUNTER — Ambulatory Visit (HOSPITAL_COMMUNITY)
Admission: RE | Admit: 2016-07-25 | Discharge: 2016-07-25 | Disposition: A | Discharge status: Home / Self Care | Payer: Medicare HMO | Source: Ambulatory Visit | Attending: Neurosurgery | Admitting: Neurosurgery

## 2016-07-25 DIAGNOSIS — M47816 Spondylosis without myelopathy or radiculopathy, lumbar region: Secondary | ICD-10-CM | POA: Insufficient documentation

## 2016-07-25 DIAGNOSIS — M4326 Fusion of spine, lumbar region: Secondary | ICD-10-CM | POA: Diagnosis not present

## 2016-07-25 DIAGNOSIS — I700 Atherosclerosis of aorta: Secondary | ICD-10-CM | POA: Diagnosis not present

## 2016-07-31 DIAGNOSIS — H179 Unspecified corneal scar and opacity: Secondary | ICD-10-CM | POA: Diagnosis not present

## 2016-07-31 DIAGNOSIS — H04162 Lacrimal gland dislocation, left lacrimal gland: Secondary | ICD-10-CM | POA: Diagnosis not present

## 2016-07-31 DIAGNOSIS — Z880 Allergy status to penicillin: Secondary | ICD-10-CM | POA: Diagnosis not present

## 2016-07-31 DIAGNOSIS — R69 Illness, unspecified: Secondary | ICD-10-CM | POA: Diagnosis not present

## 2016-07-31 DIAGNOSIS — H5203 Hypermetropia, bilateral: Secondary | ICD-10-CM | POA: Diagnosis not present

## 2016-07-31 DIAGNOSIS — H1789 Other corneal scars and opacities: Secondary | ICD-10-CM | POA: Diagnosis not present

## 2016-07-31 DIAGNOSIS — H524 Presbyopia: Secondary | ICD-10-CM | POA: Diagnosis not present

## 2016-07-31 DIAGNOSIS — I10 Essential (primary) hypertension: Secondary | ICD-10-CM | POA: Diagnosis not present

## 2016-07-31 DIAGNOSIS — H2513 Age-related nuclear cataract, bilateral: Secondary | ICD-10-CM | POA: Diagnosis not present

## 2016-08-03 ENCOUNTER — Other Ambulatory Visit (HOSPITAL_COMMUNITY): Payer: Self-pay | Admitting: Nurse Practitioner

## 2016-08-03 DIAGNOSIS — M47816 Spondylosis without myelopathy or radiculopathy, lumbar region: Secondary | ICD-10-CM

## 2016-08-09 DIAGNOSIS — N182 Chronic kidney disease, stage 2 (mild): Secondary | ICD-10-CM | POA: Diagnosis not present

## 2016-08-09 DIAGNOSIS — R809 Proteinuria, unspecified: Secondary | ICD-10-CM | POA: Diagnosis not present

## 2016-08-10 DIAGNOSIS — D509 Iron deficiency anemia, unspecified: Secondary | ICD-10-CM | POA: Diagnosis not present

## 2016-08-10 DIAGNOSIS — Z720 Tobacco use: Secondary | ICD-10-CM | POA: Diagnosis not present

## 2016-08-10 DIAGNOSIS — D472 Monoclonal gammopathy: Secondary | ICD-10-CM | POA: Diagnosis not present

## 2016-08-10 DIAGNOSIS — E1122 Type 2 diabetes mellitus with diabetic chronic kidney disease: Secondary | ICD-10-CM | POA: Diagnosis not present

## 2016-08-10 DIAGNOSIS — N041 Nephrotic syndrome with focal and segmental glomerular lesions: Secondary | ICD-10-CM | POA: Diagnosis not present

## 2016-08-10 DIAGNOSIS — R809 Proteinuria, unspecified: Secondary | ICD-10-CM | POA: Diagnosis not present

## 2016-08-10 DIAGNOSIS — N182 Chronic kidney disease, stage 2 (mild): Secondary | ICD-10-CM | POA: Diagnosis not present

## 2016-08-10 DIAGNOSIS — E669 Obesity, unspecified: Secondary | ICD-10-CM | POA: Diagnosis not present

## 2016-08-10 DIAGNOSIS — I10 Essential (primary) hypertension: Secondary | ICD-10-CM | POA: Diagnosis not present

## 2016-08-17 ENCOUNTER — Telehealth: Payer: Self-pay | Admitting: Orthopedic Surgery

## 2016-08-17 DIAGNOSIS — R69 Illness, unspecified: Secondary | ICD-10-CM | POA: Diagnosis not present

## 2016-08-17 DIAGNOSIS — H2511 Age-related nuclear cataract, right eye: Secondary | ICD-10-CM | POA: Diagnosis not present

## 2016-08-17 DIAGNOSIS — H2513 Age-related nuclear cataract, bilateral: Secondary | ICD-10-CM | POA: Diagnosis not present

## 2016-08-17 NOTE — Telephone Encounter (Signed)
After several unsuccessful attempts to contact this patient to schedule an appointment in this office, I have sent a letter back to the PCP informing them of this and also that we no longer will keep the patient's referral notes in our possession.

## 2016-08-18 ENCOUNTER — Ambulatory Visit (HOSPITAL_COMMUNITY)
Admission: RE | Admit: 2016-08-18 | Discharge: 2016-08-18 | Disposition: A | Discharge status: Home / Self Care | Payer: Medicare HMO | Source: Ambulatory Visit | Attending: Nurse Practitioner | Admitting: Nurse Practitioner

## 2016-08-18 DIAGNOSIS — M4326 Fusion of spine, lumbar region: Secondary | ICD-10-CM | POA: Diagnosis not present

## 2016-08-18 DIAGNOSIS — M47816 Spondylosis without myelopathy or radiculopathy, lumbar region: Secondary | ICD-10-CM | POA: Diagnosis not present

## 2016-08-22 DIAGNOSIS — H5201 Hypermetropia, right eye: Secondary | ICD-10-CM | POA: Diagnosis not present

## 2016-08-22 DIAGNOSIS — H179 Unspecified corneal scar and opacity: Secondary | ICD-10-CM | POA: Diagnosis not present

## 2016-08-23 DIAGNOSIS — E669 Obesity, unspecified: Secondary | ICD-10-CM | POA: Diagnosis not present

## 2016-08-23 DIAGNOSIS — H918X2 Other specified hearing loss, left ear: Secondary | ICD-10-CM | POA: Diagnosis not present

## 2016-08-23 DIAGNOSIS — R69 Illness, unspecified: Secondary | ICD-10-CM | POA: Diagnosis not present

## 2016-08-23 DIAGNOSIS — H2513 Age-related nuclear cataract, bilateral: Secondary | ICD-10-CM | POA: Diagnosis not present

## 2016-08-23 DIAGNOSIS — H52211 Irregular astigmatism, right eye: Secondary | ICD-10-CM | POA: Diagnosis not present

## 2016-08-23 DIAGNOSIS — H179 Unspecified corneal scar and opacity: Secondary | ICD-10-CM | POA: Diagnosis not present

## 2016-08-23 DIAGNOSIS — F17210 Nicotine dependence, cigarettes, uncomplicated: Secondary | ICD-10-CM | POA: Diagnosis not present

## 2016-08-23 DIAGNOSIS — H2511 Age-related nuclear cataract, right eye: Secondary | ICD-10-CM | POA: Diagnosis not present

## 2016-08-23 DIAGNOSIS — K219 Gastro-esophageal reflux disease without esophagitis: Secondary | ICD-10-CM | POA: Diagnosis not present

## 2016-08-23 DIAGNOSIS — Z9841 Cataract extraction status, right eye: Secondary | ICD-10-CM | POA: Insufficient documentation

## 2016-08-23 DIAGNOSIS — N400 Enlarged prostate without lower urinary tract symptoms: Secondary | ICD-10-CM | POA: Diagnosis not present

## 2016-08-23 DIAGNOSIS — I10 Essential (primary) hypertension: Secondary | ICD-10-CM | POA: Diagnosis not present

## 2016-08-24 DIAGNOSIS — E119 Type 2 diabetes mellitus without complications: Secondary | ICD-10-CM | POA: Diagnosis not present

## 2016-08-24 DIAGNOSIS — Z880 Allergy status to penicillin: Secondary | ICD-10-CM | POA: Diagnosis not present

## 2016-08-24 DIAGNOSIS — Z961 Presence of intraocular lens: Secondary | ICD-10-CM | POA: Diagnosis not present

## 2016-08-24 DIAGNOSIS — Z48810 Encounter for surgical aftercare following surgery on the sense organs: Secondary | ICD-10-CM | POA: Diagnosis not present

## 2016-08-24 DIAGNOSIS — H5200 Hypermetropia, unspecified eye: Secondary | ICD-10-CM | POA: Diagnosis not present

## 2016-08-24 DIAGNOSIS — R69 Illness, unspecified: Secondary | ICD-10-CM | POA: Diagnosis not present

## 2016-08-24 DIAGNOSIS — I10 Essential (primary) hypertension: Secondary | ICD-10-CM | POA: Diagnosis not present

## 2016-08-24 DIAGNOSIS — H2512 Age-related nuclear cataract, left eye: Secondary | ICD-10-CM | POA: Diagnosis not present

## 2016-08-24 DIAGNOSIS — H5210 Myopia, unspecified eye: Secondary | ICD-10-CM | POA: Diagnosis not present

## 2016-08-24 DIAGNOSIS — H524 Presbyopia: Secondary | ICD-10-CM | POA: Diagnosis not present

## 2016-09-01 DIAGNOSIS — M47816 Spondylosis without myelopathy or radiculopathy, lumbar region: Secondary | ICD-10-CM | POA: Diagnosis not present

## 2016-09-06 DIAGNOSIS — R809 Proteinuria, unspecified: Secondary | ICD-10-CM | POA: Diagnosis not present

## 2016-09-06 DIAGNOSIS — N182 Chronic kidney disease, stage 2 (mild): Secondary | ICD-10-CM | POA: Diagnosis not present

## 2016-09-12 ENCOUNTER — Encounter (HOSPITAL_COMMUNITY): Payer: Medicare HMO

## 2016-09-12 ENCOUNTER — Encounter (HOSPITAL_COMMUNITY): Payer: Medicare HMO | Attending: Hematology & Oncology | Admitting: Hematology & Oncology

## 2016-09-12 ENCOUNTER — Encounter (HOSPITAL_COMMUNITY): Payer: Self-pay | Admitting: Hematology & Oncology

## 2016-09-12 VITALS — BP 122/80 | HR 63 | Temp 97.8°F | Resp 16 | Wt 239.0 lb

## 2016-09-12 DIAGNOSIS — N049 Nephrotic syndrome with unspecified morphologic changes: Secondary | ICD-10-CM

## 2016-09-12 DIAGNOSIS — M62541 Muscle wasting and atrophy, not elsewhere classified, right hand: Secondary | ICD-10-CM

## 2016-09-12 DIAGNOSIS — D472 Monoclonal gammopathy: Secondary | ICD-10-CM

## 2016-09-12 DIAGNOSIS — Z23 Encounter for immunization: Secondary | ICD-10-CM | POA: Diagnosis not present

## 2016-09-12 DIAGNOSIS — M792 Neuralgia and neuritis, unspecified: Secondary | ICD-10-CM | POA: Diagnosis not present

## 2016-09-12 DIAGNOSIS — Z0000 Encounter for general adult medical examination without abnormal findings: Secondary | ICD-10-CM

## 2016-09-12 DIAGNOSIS — R634 Abnormal weight loss: Secondary | ICD-10-CM

## 2016-09-12 DIAGNOSIS — G629 Polyneuropathy, unspecified: Secondary | ICD-10-CM

## 2016-09-12 LAB — COMPREHENSIVE METABOLIC PANEL
ALT: 13 U/L — ABNORMAL LOW (ref 17–63)
AST: 16 U/L (ref 15–41)
Albumin: 3.8 g/dL (ref 3.5–5.0)
Alkaline Phosphatase: 63 U/L (ref 38–126)
Anion gap: 6 (ref 5–15)
BUN: 22 mg/dL — ABNORMAL HIGH (ref 6–20)
CO2: 25 mmol/L (ref 22–32)
Calcium: 9 mg/dL (ref 8.9–10.3)
Chloride: 106 mmol/L (ref 101–111)
Creatinine, Ser: 1.09 mg/dL (ref 0.61–1.24)
GFR calc Af Amer: >60 mL/min (ref >60)
GFR calc non Af Amer: >60 mL/min (ref >60)
Glucose, Bld: 112 mg/dL — ABNORMAL HIGH (ref 65–99)
Potassium: 3.4 mmol/L — ABNORMAL LOW (ref 3.5–5.1)
Sodium: 137 mmol/L (ref 135–145)
Total Bilirubin: 0.8 mg/dL (ref 0.3–1.2)
Total Protein: 6.9 g/dL (ref 6.5–8.1)

## 2016-09-12 LAB — CBC WITH DIFFERENTIAL/PLATELET
Basophils Absolute: 0 10*3/uL (ref 0.0–0.1)
Basophils Relative: 0 %
Eosinophils Absolute: 0 10*3/uL (ref 0.0–0.7)
Eosinophils Relative: 0 %
HCT: 45.3 % (ref 39.0–52.0)
Hemoglobin: 15.1 g/dL (ref 13.0–17.0)
Lymphocytes Relative: 31 %
Lymphs Abs: 2.7 10*3/uL (ref 0.7–4.0)
MCH: 27.6 pg (ref 26.0–34.0)
MCHC: 33.3 g/dL (ref 30.0–36.0)
MCV: 82.8 fL (ref 78.0–100.0)
Monocytes Absolute: 0.8 10*3/uL (ref 0.1–1.0)
Monocytes Relative: 9 %
Neutro Abs: 5.2 10*3/uL (ref 1.7–7.7)
Neutrophils Relative %: 60 %
Platelets: 259 10*3/uL (ref 150–400)
RBC: 5.47 MIL/uL (ref 4.22–5.81)
RDW: 12.7 % (ref 11.5–15.5)
WBC: 8.8 10*3/uL (ref 4.0–10.5)

## 2016-09-12 LAB — PROTEIN, URINE, 24 HOUR
Collection Interval-UPROT: 24 hours
Protein, 24H Urine: 96 mg/d (ref 50–100)
Protein, Urine: 6 mg/dL
Urine Total Volume-UPROT: 1600 mL

## 2016-09-12 LAB — LACTATE DEHYDROGENASE: LDH: 116 U/L (ref 98–192)

## 2016-09-12 LAB — C-REACTIVE PROTEIN: CRP: <0.8 mg/dL (ref <1.0)

## 2016-09-12 LAB — SEDIMENTATION RATE: Sed Rate: 8 mm/hr (ref 0–16)

## 2016-09-12 MED ORDER — INFLUENZA VAC SPLIT QUAD 0.5 ML IM SUSY
0.5000 mL | PREFILLED_SYRINGE | Freq: Once | INTRAMUSCULAR | Status: AC
  Administered 2016-09-12: 0.5 mL via INTRAMUSCULAR
  Filled 2016-09-12: qty 0.5

## 2016-09-12 MED ORDER — DOXYCYCLINE HYCLATE 100 MG PO TABS: 100.0000 mg | ORAL_TABLET | Freq: Two times a day (BID) | ORAL | 0 refills | Status: AC

## 2016-09-12 NOTE — Patient Instructions (Addendum)
[place] at [facility] Discharge Instructions  RECOMMENDATIONS MADE BY THE CONSULTANT AND ANY TEST RESULTS WILL BE SENT TO YOUR REFERRING PHYSICIAN.  You saw Dr.Penland today. Doxycycline 100 mg-take twice a day for 7 days. We will send you back to Dr. Hilma Favors. Follow up in 6 months with Tom with labs See Amy at checkout for appointments.  Thank you for choosing [place] at [facility] to provide your oncology and hematology care.  To afford each patient quality time with our provider, please arrive at least 15 minutes before your scheduled appointment time.   Beginning January 23rd 2017 lab work for the [facility] will be done in the  Main lab at [facility] on 1st floor. If you have a lab appointment with the [facility] please come in thru the  Main Entrance and check in at the main information desk  You need to re-schedule your appointment should you arrive 10 or more minutes late.  We strive to give you quality time with our providers, and arriving late affects you and other patients whose appointments are after yours.  Also, if you no show three or more times for appointments you may be dismissed from the clinic at the providers discretion.     Again, thank you for choosing [facility].  Our hope is that these requests will decrease the amount of time that you wait before being seen by our physicians.       _____________________________________________________________  Should you have questions after your visit to [facility], please contact our office at [phone] between the hours of 8:30 a.m. and 4:30 p.m.  Voicemails left after 4:30 p.m. will not be returned until the following business day.  For prescription refill requests, have your pharmacy contact our office.         Resources For Cancer Patients and their Caregivers ? American Cancer Society: Can assist with transportation, wigs,  general needs, runs Look Good Feel Better.        (828) 424-4284 ? Cancer Care: Provides financial assistance, online support groups, medication/co-pay assistance.  1-800-813-HOPE 304 137 8086) ? [facility] Assists [place] cancer patients and their families through emotional , educational and financial support.  623-353-1001 ? [place] DSS Where to apply for food stamps, Medicaid and utility assistance. (778)412-0655 ? [facility]: Transportation to medical appointments. 606 575 4438 ? Social Security Administration: May apply for disability if have a Stage IV cancer. (819) 450-6509 (204)407-9716 ? [employer], Disability and Transit Services: Assists with nutrition, care and transit needs. [place] Support Programs: @10RELATIVEDAYS @ > Cancer Support Group  2nd Tuesday of the month 1pm-2pm, Journey Room  > Creative Journey  3rd Tuesday of the month 1130am-1pm, Journey Room  > Look Good Feel Better  1st Wednesday of the month 10am-12 noon, Journey Room (Call [place] to register [phone])

## 2016-09-12 NOTE — Progress Notes (Signed)
[facility] Hematology/Oncology Consultation   Name: Gregory Diaz      MRN: [id number]     Date: 09/12/2016 Time:2:19 PM   REFERRING PHYSICIAN:  Orson Eva, MD (Hospitalist)  REASON FOR CONSULT:  MGUS BMBX with polyclonal plasmacytosis, normal cytogenetics 08/24/2015 SPEP showing a small M-Spike of 0.5% that may represent a monoclonal protein with significant elevation of kappa and lambda light chains with a normal ratio   UPEP showing 13867 mg/24hr of protein and M-Spike of 555 mg/24hr in the setting of a renal biopsy demonstrating mild focal segmental glomerulosclerosis.  HISTORY OF PRESENT ILLNESS:   [name] is a 65 year old black American man with a past medical history significant for nephrotic syndrome, HTN, diastolic CHL, and obesity who is here for a follow up for abnormal SPEP.  Patient has been experiencing weight loss. He has changed his diet. He has limited eating steak and carbohydrates. Gregory Diaz also mentions that he has less of an appetite now compared to before.   Patient experiences neuropathy in third and fourth digits. He also experiences muscle spasms in shoulder and back. These symptoms started recently.   PCP is Dr. Hilma Diaz. Patient saw him last a month ago. Patient notes intermittent clear drainage from his naval, he had an umbilical hernia repair several years ago.   Labs continue to improve. Realistically he looks much better. Weight is down approx 90 pounds over the past year but this is secondary to significant loss of fluid and also now weight from diet changes.    PAST MEDICAL HISTORY:   Past Medical History:  Diagnosis Date  . Abnormal SPEP 09/09/2015  . Anxiety   . Arthritis    DDD, low back  . Early cataracts, bilateral   . Enlarged prostate   . Heart murmur    was told at age 47, but has not had any problems  . Hypertension   . MGUS (monoclonal gammopathy of unknown significance) 01/29/2016    ALLERGIES: Allergies    Allergen Reactions  . Penicillins Swelling    Has patient had a PCN reaction causing immediate rash, facial/tongue/throat swelling, SOB or lightheadedness with hypotension: Yes Has patient had a PCN reaction causing severe rash involving mucus membranes or skin necrosis: No Has patient had a PCN reaction that required hospitalization No Has patient had a PCN reaction occurring within the last 10 years: No If all of the above answers are "NO", then may proceed with Cephalosporin use.       MEDICATIONS: I have reviewed the patient's current medications.    Current Outpatient Prescriptions on File Prior to Visit  Medication Sig Dispense Refill  . furosemide (LASIX) 80 MG tablet Take 1 tablet (80 mg total) by mouth 2 (two) times daily. 60 tablet 1  . ibuprofen (ADVIL,MOTRIN) 800 MG tablet Take 1 tablet (800 mg total) by mouth 3 (three) times daily. 21 tablet 0  . lisinopril (PRINIVIL,ZESTRIL) 20 MG tablet Take 20 mg by mouth daily.    . methocarbamol (ROBAXIN) 500 MG tablet Take 1 tablet (500 mg total) by mouth 2 (two) times daily as needed for muscle spasms. 20 tablet 0  . metolazone (ZAROXOLYN) 2.5 MG tablet Take 2.5 mg by mouth daily. Reported on 01/19/2016    . oxyCODONE-acetaminophen (PERCOCET) 10-325 MG tablet Take 1 tablet by mouth every 4 (four) hours as needed for pain.    . predniSONE (DELTASONE) 20 MG tablet Take 20 mg by mouth daily with  breakfast. Reported on 05/13/2016    . ranitidine (ZANTAC) 150 MG capsule Take 150 mg by mouth 2 (two) times daily.    Marland Kitchen senna (SENOKOT) 8.6 MG tablet Take 1 tablet by mouth as needed for constipation. Will take up to 3 if needed    . sitaGLIPtin (JANUVIA) 50 MG tablet Take 1 tablet (50 mg total) by mouth daily. 30 tablet 1   Current Facility-Administered Medications on File Prior to Visit  Medication Dose Route Frequency Provider Last Rate Last Dose  . Chlorhexidine Gluconate Cloth 2 % PADS 6 each  6 each Topical Once Gregory Epley, MD       And   . Chlorhexidine Gluconate Cloth 2 % PADS 6 each  6 each Topical Once Gregory Epley, MD         PAST SURGICAL HISTORY Past Surgical History:  Procedure Laterality Date  . BACK SURGERY  2004, 2015  . COLONOSCOPY    . EYE SURGERY     L eye- as a child   . FRACTURE SURGERY Right    thumb  . HEMORROIDECTOMY    . HERNIA REPAIR     umbilical hernia repair  . MASS EXCISION Right 07/11/2016   Procedure: EXCISION 6CM RIGHT SHOULDER MASS;  Surgeon: Gregory Epley, MD;  Location: [facility];  Service: Vascular;  Laterality: Right;  . TONSILLECTOMY      FAMILY HISTORY: Family History  Problem Relation Age of Onset  . Gallstones Father   . Ulcers Father   . Alcohol abuse Father   . Diabetes Daughter   . Diabetes Brother   . Diabetes Sister    Review of Systems  14 point review of systems was performed and is negative except as detailed under history of present illness and above  SOCIAL HISTORY:  reports that he has been smoking Cigarettes.  He has a 50.00 pack-year smoking history. He has never used smokeless tobacco. He reports that he does not drink alcohol or use drugs.  PERFORMANCE STATUS: The patient's performance status is 2 - Symptomatic, <50% confined to bed  REVIEW OF SYSTEMS: Positive for back pain, joint pain, and neck pain Positive for anxiety/nervous Positive for nerve pain/neuropathy 14 point review of systems was performed and is negative except as detailed under history of present illness and above  PHYSICAL EXAM: Most Recent Vital Signs: Blood pressure 122/80, pulse 63, temperature 97.8 F (36.6 C), temperature source Oral, resp. rate 16, weight 239 lb (108.4 kg), SpO2 99 %. General appearance: alert, cooperative, appears stated age, no distress, obese and accompanied by his wife and son. Head: Normocephalic, without obvious abnormality, atraumatic Eyes: negative findings: lids and lashes normal, conjunctivae and sclerae normal and corneas clear Throat: normal  findings: lips normal without lesions, buccal mucosa normal and oropharynx pink & moist without lesions or evidence of thrush Neck: no adenopathy and supple, symmetrical, trachea midline. [place] hump consistent with prolonged steroid use. Lungs: clear to auscultation bilaterally Heart: regular rate and rhythm Abdomen: normal findings: no masses palpable and no organomegaly and abnormal findings: obese.  Extremities: Muscle atrophy on right hand.  No edema noted. Skin: Skin color, texture, turgor normal. No rashes or lesions Lymph nodes: Cervical, supraclavicular, and axillary nodes normal. Neurologic: Grossly normal  LABORATORY DATA:  I have reviewed the data as listed Results for RHYLEE, NUNN (MRN [id number]) as of 09/12/2016 14:18  Ref. Range 09/12/2016 13:05  Sodium Latest Ref Range: 135 - 145 mmol/L 137  Potassium Latest Ref  Range: 3.5 - 5.1 mmol/L 3.4 (L)  Chloride Latest Ref Range: 101 - 111 mmol/L 106  CO2 Latest Ref Range: 22 - 32 mmol/L 25  BUN Latest Ref Range: 6 - 20 mg/dL 22 (H)  Creatinine Latest Ref Range: 0.61 - 1.24 mg/dL 1.09  Calcium Latest Ref Range: 8.9 - 10.3 mg/dL 9.0  EGFR (Non-African Amer.) Latest Ref Range: >60 mL/min >60  EGFR (African American) Latest Ref Range: >60 mL/min >60  Glucose Latest Ref Range: 65 - 99 mg/dL 112 (H)  Anion gap Latest Ref Range: 5 - 15  6  Alkaline Phosphatase Latest Ref Range: 38 - 126 U/L 63  Albumin Latest Ref Range: 3.5 - 5.0 g/dL 3.8  AST Latest Ref Range: 15 - 41 U/L 16  ALT Latest Ref Range: 17 - 63 U/L 13 (L)  Total Protein Latest Ref Range: 6.5 - 8.1 g/dL 6.9  Total Bilirubin Latest Ref Range: 0.3 - 1.2 mg/dL 0.8  LDH Latest Ref Range: 98 - 192 U/L 116  WBC Latest Ref Range: 4.0 - 10.5 K/uL 8.8  RBC Latest Ref Range: 4.22 - 5.81 MIL/uL 5.47  Hemoglobin Latest Ref Range: 13.0 - 17.0 g/dL 15.1  HCT Latest Ref Range: 39.0 - 52.0 % 45.3  MCV Latest Ref Range: 78.0 - 100.0 fL 82.8  MCH Latest Ref Range: 26.0 - 34.0 pg  27.6  MCHC Latest Ref Range: 30.0 - 36.0 g/dL 33.3  RDW Latest Ref Range: 11.5 - 15.5 % 12.7  Platelets Latest Ref Range: 150 - 400 K/uL 259  Neutrophils Latest Units: % 60  Lymphocytes Latest Units: % 31  Monocytes Relative Latest Units: % 9  Eosinophil Latest Units: % 0  Basophil Latest Units: % 0  NEUT# Latest Ref Range: 1.7 - 7.7 K/uL 5.2  Lymphocyte # Latest Ref Range: 0.7 - 4.0 K/uL 2.7  Monocyte # Latest Ref Range: 0.1 - 1.0 K/uL 0.8  Eosinophils Absolute Latest Ref Range: 0.0 - 0.7 K/uL 0.0  Basophils Absolute Latest Ref Range: 0.0 - 0.1 K/uL 0.0    RADIOGRAPHY: I have personally reviewed the radiological images as listed and agreed with the findings in the report.   CLINICAL DATA: Abnormal serum protein electrophoresis.  EXAM: METASTATIC BONE SURVEY  COMPARISON: None.  FINDINGS: Severe degenerative disc disease is noted at C6-7 and C7-T1. Multilevel degenerative disc disease is noted in the thoracic spine. Status post surgical posterior fusion of L2, L3, L4 and L5. No lytic lesions are noted involving the visualize ribs, skull, pelvis, spine or extremities.  IMPRESSION: No definite lytic lesions are noted in the visualized skeleton.   Electronically Signed  By: Marijo Conception, M.D.  On: 09/10/2015 15:59    PATHOLOGY:    Diagnosis Kidney, biopsy w/ EM l, Lower pole of right kidney KIDNEY, NEEDLE BIOPSY: MILD FOCAL SEGMENTAL GLOMERULOSCLEROSIS (SEE COMMENT) Microscopic Comment For complete report, please see Epic, [id number]. Diagnosis Note Diagnostic Evaluation conducted by J. Annye Rusk, MD at [facility], [place] # [id number] Enid Cutter MD Pathologist, Electronic Signature (Case signed 09/02/2015)       ASSESSMENT/PLAN:  MGUS, IgG monoclonal protein with lambda light chain BMBX with polyclonal plasmacytosis, normal cytogenetics (mild lambda light chain excess) UPEP showing 13867 mg/24hr of protein and M-Spike  of 555 mg/24hr in the setting of a renal biopsy demonstrating mild focal segmental glomerulosclerosis.  Overall he looks markedly improved. Renal function is better. Hypoalbuminemia has resolved. Anemia is also resolved. He continues on prednisone. He continues to follow with  nephrology.  Jaxxen appears well. His blood work is normal. Potassium is a little low. He has MGUS. Will call with the results from the remainder of his blood work today.   Patient is concerned about his weight loss. It appears that his weight loss stems from recent dietary changes. I will have our nutritionist, Ovid Curd, talk to the patient about how to eat healthy while stabilizing weight.  I will order a nerve conduction study to address patient's nerve pain and neuropathy. This is predominantly in one hand, I do not feel this is related to his MGUS.   I will write doxycycline for patient's navel, as it has been draining fluid. I will also refer him back to Dr. Hilma Diaz to have the area further evaluated. Patient is allergic to penicillin.   Meds ordered this encounter  Medications  . Influenza vac split quadrivalent PF (FLUARIX) injection 0.5 mL  . doxycycline (VIBRA-TABS) 100 MG tablet    Sig: Take 1 tablet (100 mg total) by mouth 2 (two) times daily.    Dispense:  14 tablet    Refill:  0     Orders Placed This Encounter  Procedures  . CBC with Differential    Standing Status:   Future    Standing Expiration Date:   09/12/2017  . Comprehensive metabolic panel    Standing Status:   Future    Standing Expiration Date:   09/12/2017  . Lactate dehydrogenase    Standing Status:   Future    Standing Expiration Date:   09/12/2017  . Sedimentation rate    Standing Status:   Future    Standing Expiration Date:   09/12/2017  . Protein electrophoresis, serum    Standing Status:   Future    Standing Expiration Date:   09/12/2017  . Protein electrophoresis, urine    Standing Status:   Future    Standing Expiration  Date:   09/12/2017  . Protein, urine, 24 hour    Standing Status:   Future    Standing Expiration Date:   09/12/2017  . Immunofixation, urine    Standing Status:   Future    Standing Expiration Date:   09/12/2017  . Kappa/lambda light chains    Standing Status:   Future    Standing Expiration Date:   09/12/2017  . IgG, IgA, IgM    Standing Status:   Future    Standing Expiration Date:   09/12/2017  . Immunofixation electrophoresis    Standing Status:   Future    Standing Expiration Date:   09/12/2017  . C-reactive protein    Standing Status:   Future    Standing Expiration Date:   09/12/2017  . Nerve conduction test    RUE, hand muscle atrophy    Standing Status:   Future    Standing Expiration Date:   09/12/2017    Order Specific Question:   Where should this test be performed?    Answer:   [facility]     All questions were answered. The patient knows to call the clinic with any problems, questions or concerns. We can certainly see the patient much sooner if necessary.  This document serves as a record of services personally performed by Ancil Linsey, MD. It was created on her behalf by Elmyra Ricks, a trained medical scribe. The creation of this record is based on the scribe's personal observations and the provider's statements to them. This document has been checked and approved by the attending provider.  I have reviewed the above documentation for accuracy and completeness, and I agree with the above.  This note is electronically signed by: Molli Hazard, MD  09/12/2016 2:19 PM

## 2016-09-12 NOTE — Progress Notes (Signed)
Gregory Diaz presents today for injection per MD orders. Flu Vaccine administered IM in right Upper Arm. Administration without incident. Patient tolerated well.

## 2016-09-13 DIAGNOSIS — L928 Other granulomatous disorders of the skin and subcutaneous tissue: Secondary | ICD-10-CM | POA: Diagnosis not present

## 2016-09-13 DIAGNOSIS — Z1389 Encounter for screening for other disorder: Secondary | ICD-10-CM | POA: Diagnosis not present

## 2016-09-13 DIAGNOSIS — Z6833 Body mass index (BMI) 33.0-33.9, adult: Secondary | ICD-10-CM | POA: Diagnosis not present

## 2016-09-13 LAB — IGG, IGA, IGM
IgA: 133 mg/dL (ref 61–437)
IgG (Immunoglobin G), Serum: 1094 mg/dL (ref 700–1600)
IgM, Serum: 43 mg/dL (ref 20–172)

## 2016-09-13 LAB — KAPPA/LAMBDA LIGHT CHAINS
Kappa free light chain: 16.7 mg/L (ref 3.3–19.4)
Kappa, lambda light chain ratio: 0.45 (ref 0.26–1.65)
Lambda free light chains: 37.1 mg/L — ABNORMAL HIGH (ref 5.7–26.3)

## 2016-09-13 LAB — PROTEIN ELECTROPHORESIS, SERUM
A/G Ratio: 1.2 (ref 0.7–1.7)
Albumin ELP: 3.4 g/dL (ref 2.9–4.4)
Alpha-1-Globulin: 0.2 g/dL (ref 0.0–0.4)
Alpha-2-Globulin: 0.8 g/dL (ref 0.4–1.0)
Beta Globulin: 0.8 g/dL (ref 0.7–1.3)
Gamma Globulin: 1.1 g/dL (ref 0.4–1.8)
Globulin, Total: 2.9 g/dL (ref 2.2–3.9)
M-Spike, %: 0.6 g/dL — ABNORMAL HIGH
Total Protein ELP: 6.3 g/dL (ref 6.0–8.5)

## 2016-09-13 LAB — IMMUNOFIXATION, URINE

## 2016-09-14 LAB — IMMUNOFIXATION ELECTROPHORESIS
IgA: 133 mg/dL (ref 61–437)
IgG (Immunoglobin G), Serum: 1098 mg/dL (ref 700–1600)
IgM, Serum: 45 mg/dL (ref 20–172)
Total Protein ELP: 6.3 g/dL (ref 6.0–8.5)

## 2016-09-14 LAB — UIFE/LIGHT CHAINS/TP QN, 24-HR UR
% BETA, Urine: 0 %
ALPHA 1 URINE: 0 %
Albumin, U: 100 %
Alpha 2, Urine: 0 %
Free Kappa/Lambda Ratio: 2.16 (ref 2.04–10.37)
Free Lambda Lt Chains,Ur: 7.09 mg/L — ABNORMAL HIGH (ref 0.24–6.66)
Free Lt Chn Excr Rate: 15.3 mg/L (ref 1.35–24.19)
GAMMA GLOBULIN URINE: 0 %
PDF: 0
Total Protein, Urine-Ur/day: 123 mg/24 hr (ref 30–150)
Total Protein, Urine: 7.7 mg/dL
Total Volume: 1600

## 2016-09-21 ENCOUNTER — Telehealth: Payer: Self-pay | Admitting: Dietician

## 2016-09-21 NOTE — Telephone Encounter (Signed)
RD consulted to assess pt due to recent wt loss. RD unsuccessfully tried to contact patient 2x. Left message. Can try at later date.   Burtis Junes RD, LDN, CNSC Clinical Nutrition Pager: [phone] 09/21/2016 3:14 PM

## 2016-09-22 ENCOUNTER — Encounter: Payer: Self-pay | Admitting: Dietician

## 2016-09-22 NOTE — Progress Notes (Signed)
RD consulted by MD due to weight loss.   Patient Called back today.   Wt Readings from Last 10 Encounters:  09/12/16 239 lb (108.4 kg)  07/06/16 268 lb (121.6 kg)  06/12/16 264 lb (119.7 kg)  05/13/16 265 lb (120.2 kg)  04/20/16 280 lb (127 kg)  01/19/16 280 lb 12.8 oz (127.4 kg)  10/01/15 (!) 303 lb 12.8 oz (137.8 kg)  09/17/15 (!) 328 lb (148.8 kg)  09/10/15 (!) 330 lb 12.8 oz (150 kg)  08/27/15 (!) 318 lb 7 oz (144.4 kg)   Patient weight has decreased by ~90 lbs in the last year and 30 in the last 2 months.   He says his weight loss began October 2016 when he was discharged from a hospital admission.   Pt says he is on on diet and avoids foods that will "mess my proteins up". Later he says he is not a diabetic, but has to follow a diabetic diet because it helps his underlying condition.   Pt is eating 1-3 meals a day, most often 1-2. Went through 24 hour diet recall. He typically eats hot cereal or eggs+bacon for breakfast. If he eats Lunch it will be a sandwich. At dinner he trys to eat a vegetable and meat. He said he started on a diet after his hospital admission 08/2015. He talks about reducing his salt, eating vegetabkes, no longer frying his food. He says his portion sizes have also decreased. Potentially, his weight loss is attributable to following a healthier diet as well as a large amount of fluid loss?   Recommended that pt continue to eat 3 meals a day. Discussed some basic healthy eating concepts such as eating vegetables and lean protein often. If he continues to lose weight, reccommended starting some sort of supplemental shake such as Ensure/Boost.   Unfortunately, pt is a poor historian and tended to ramble on and make innumerable nonsenical statements about his health management/conditions such as how he eats white bread because "white bread doesn't have the bad fats in it", his "Januvia Medication Pulls the protein from his kidneys" etc etc.   From what could gather,  pt sounds to have made a lot of healthy eating changes. He was placed on large dosage of diuretics since he was hospitalized in 08/2015. Suspect these have played large part in weight loss.That said his rate of loss does seem excessive   RD instructed patient to call RD if he continues to lose weight.   Mailed my contact info, coupons, and handouts titled "Increasing Calories and Protein"  Burtis Junes RD, LDN, [place] Nutrition Pager: [phone] 09/22/2016 11:45 AM

## 2016-09-26 ENCOUNTER — Telehealth (HOSPITAL_COMMUNITY): Payer: Self-pay | Admitting: *Deleted

## 2016-09-26 DIAGNOSIS — M47816 Spondylosis without myelopathy or radiculopathy, lumbar region: Secondary | ICD-10-CM | POA: Diagnosis not present

## 2016-09-26 NOTE — Telephone Encounter (Signed)
LMOM that labs are stable.   

## 2016-09-28 DIAGNOSIS — Z48810 Encounter for surgical aftercare following surgery on the sense organs: Secondary | ICD-10-CM | POA: Diagnosis not present

## 2016-09-28 DIAGNOSIS — H52213 Irregular astigmatism, bilateral: Secondary | ICD-10-CM | POA: Diagnosis not present

## 2016-09-28 DIAGNOSIS — Z9841 Cataract extraction status, right eye: Secondary | ICD-10-CM | POA: Diagnosis not present

## 2016-09-28 DIAGNOSIS — H5202 Hypermetropia, left eye: Secondary | ICD-10-CM | POA: Diagnosis not present

## 2016-09-28 DIAGNOSIS — F17210 Nicotine dependence, cigarettes, uncomplicated: Secondary | ICD-10-CM | POA: Diagnosis not present

## 2016-09-28 DIAGNOSIS — R69 Illness, unspecified: Secondary | ICD-10-CM | POA: Diagnosis not present

## 2016-09-28 DIAGNOSIS — H179 Unspecified corneal scar and opacity: Secondary | ICD-10-CM | POA: Diagnosis not present

## 2016-09-28 DIAGNOSIS — H2512 Age-related nuclear cataract, left eye: Secondary | ICD-10-CM | POA: Diagnosis not present

## 2016-09-28 DIAGNOSIS — Z79899 Other long term (current) drug therapy: Secondary | ICD-10-CM | POA: Diagnosis not present

## 2016-09-28 DIAGNOSIS — Z961 Presence of intraocular lens: Secondary | ICD-10-CM | POA: Diagnosis not present

## 2016-10-08 ENCOUNTER — Encounter (HOSPITAL_COMMUNITY): Payer: Self-pay | Admitting: Hematology & Oncology

## 2016-10-09 DIAGNOSIS — N182 Chronic kidney disease, stage 2 (mild): Secondary | ICD-10-CM | POA: Diagnosis not present

## 2016-10-09 DIAGNOSIS — R809 Proteinuria, unspecified: Secondary | ICD-10-CM | POA: Diagnosis not present

## 2016-10-10 DIAGNOSIS — E1165 Type 2 diabetes mellitus with hyperglycemia: Secondary | ICD-10-CM | POA: Diagnosis not present

## 2016-10-10 DIAGNOSIS — Z1389 Encounter for screening for other disorder: Secondary | ICD-10-CM | POA: Diagnosis not present

## 2016-10-10 DIAGNOSIS — D472 Monoclonal gammopathy: Secondary | ICD-10-CM | POA: Diagnosis not present

## 2016-10-10 DIAGNOSIS — E119 Type 2 diabetes mellitus without complications: Secondary | ICD-10-CM | POA: Diagnosis not present

## 2016-10-10 DIAGNOSIS — Z6834 Body mass index (BMI) 34.0-34.9, adult: Secondary | ICD-10-CM | POA: Diagnosis not present

## 2016-10-10 DIAGNOSIS — E6609 Other obesity due to excess calories: Secondary | ICD-10-CM | POA: Diagnosis not present

## 2016-10-10 DIAGNOSIS — Z0000 Encounter for general adult medical examination without abnormal findings: Secondary | ICD-10-CM | POA: Diagnosis not present

## 2016-10-24 DIAGNOSIS — M47816 Spondylosis without myelopathy or radiculopathy, lumbar region: Secondary | ICD-10-CM | POA: Diagnosis not present

## 2016-10-30 DIAGNOSIS — R972 Elevated prostate specific antigen [PSA]: Secondary | ICD-10-CM | POA: Diagnosis not present

## 2016-11-03 DIAGNOSIS — D472 Monoclonal gammopathy: Secondary | ICD-10-CM | POA: Diagnosis not present

## 2016-11-03 DIAGNOSIS — E1122 Type 2 diabetes mellitus with diabetic chronic kidney disease: Secondary | ICD-10-CM | POA: Diagnosis not present

## 2016-11-03 DIAGNOSIS — N041 Nephrotic syndrome with focal and segmental glomerular lesions: Secondary | ICD-10-CM | POA: Diagnosis not present

## 2016-11-03 DIAGNOSIS — Z720 Tobacco use: Secondary | ICD-10-CM | POA: Diagnosis not present

## 2016-11-03 DIAGNOSIS — D509 Iron deficiency anemia, unspecified: Secondary | ICD-10-CM | POA: Diagnosis not present

## 2016-11-03 DIAGNOSIS — E669 Obesity, unspecified: Secondary | ICD-10-CM | POA: Diagnosis not present

## 2016-11-03 DIAGNOSIS — N182 Chronic kidney disease, stage 2 (mild): Secondary | ICD-10-CM | POA: Diagnosis not present

## 2016-11-03 DIAGNOSIS — I10 Essential (primary) hypertension: Secondary | ICD-10-CM | POA: Diagnosis not present

## 2016-11-03 DIAGNOSIS — R809 Proteinuria, unspecified: Secondary | ICD-10-CM | POA: Diagnosis not present

## 2016-12-06 DIAGNOSIS — R809 Proteinuria, unspecified: Secondary | ICD-10-CM | POA: Diagnosis not present

## 2016-12-06 DIAGNOSIS — N182 Chronic kidney disease, stage 2 (mild): Secondary | ICD-10-CM | POA: Diagnosis not present

## 2016-12-26 DIAGNOSIS — M25812 Other specified joint disorders, left shoulder: Secondary | ICD-10-CM | POA: Diagnosis not present

## 2016-12-26 DIAGNOSIS — M47816 Spondylosis without myelopathy or radiculopathy, lumbar region: Secondary | ICD-10-CM | POA: Diagnosis not present

## 2016-12-27 ENCOUNTER — Ambulatory Visit (HOSPITAL_COMMUNITY)
Admission: RE | Admit: 2016-12-27 | Discharge: 2016-12-27 | Disposition: A | Discharge status: Home / Self Care | Payer: Medicare HMO | Source: Ambulatory Visit | Attending: Neurosurgery | Admitting: Neurosurgery

## 2016-12-27 ENCOUNTER — Other Ambulatory Visit (HOSPITAL_COMMUNITY): Payer: Self-pay | Admitting: Neurosurgery

## 2016-12-27 DIAGNOSIS — M19012 Primary osteoarthritis, left shoulder: Secondary | ICD-10-CM | POA: Insufficient documentation

## 2016-12-27 DIAGNOSIS — M25812 Other specified joint disorders, left shoulder: Secondary | ICD-10-CM

## 2017-01-10 DIAGNOSIS — N182 Chronic kidney disease, stage 2 (mild): Secondary | ICD-10-CM | POA: Diagnosis not present

## 2017-01-10 DIAGNOSIS — R809 Proteinuria, unspecified: Secondary | ICD-10-CM | POA: Diagnosis not present

## 2017-02-07 DIAGNOSIS — Z720 Tobacco use: Secondary | ICD-10-CM | POA: Diagnosis not present

## 2017-02-07 DIAGNOSIS — N041 Nephrotic syndrome with focal and segmental glomerular lesions: Secondary | ICD-10-CM | POA: Diagnosis not present

## 2017-02-07 DIAGNOSIS — R809 Proteinuria, unspecified: Secondary | ICD-10-CM | POA: Diagnosis not present

## 2017-02-07 DIAGNOSIS — E1122 Type 2 diabetes mellitus with diabetic chronic kidney disease: Secondary | ICD-10-CM | POA: Diagnosis not present

## 2017-02-07 DIAGNOSIS — E669 Obesity, unspecified: Secondary | ICD-10-CM | POA: Diagnosis not present

## 2017-02-07 DIAGNOSIS — D472 Monoclonal gammopathy: Secondary | ICD-10-CM | POA: Diagnosis not present

## 2017-02-07 DIAGNOSIS — D509 Iron deficiency anemia, unspecified: Secondary | ICD-10-CM | POA: Diagnosis not present

## 2017-02-07 DIAGNOSIS — I10 Essential (primary) hypertension: Secondary | ICD-10-CM | POA: Diagnosis not present

## 2017-02-07 DIAGNOSIS — N182 Chronic kidney disease, stage 2 (mild): Secondary | ICD-10-CM | POA: Diagnosis not present

## 2017-02-19 DIAGNOSIS — Z1389 Encounter for screening for other disorder: Secondary | ICD-10-CM | POA: Diagnosis not present

## 2017-02-19 DIAGNOSIS — D472 Monoclonal gammopathy: Secondary | ICD-10-CM | POA: Diagnosis not present

## 2017-02-19 DIAGNOSIS — I10 Essential (primary) hypertension: Secondary | ICD-10-CM | POA: Diagnosis not present

## 2017-02-19 DIAGNOSIS — E119 Type 2 diabetes mellitus without complications: Secondary | ICD-10-CM | POA: Diagnosis not present

## 2017-02-19 DIAGNOSIS — Z6835 Body mass index (BMI) 35.0-35.9, adult: Secondary | ICD-10-CM | POA: Diagnosis not present

## 2017-02-19 DIAGNOSIS — E6609 Other obesity due to excess calories: Secondary | ICD-10-CM | POA: Diagnosis not present

## 2017-03-07 DIAGNOSIS — R809 Proteinuria, unspecified: Secondary | ICD-10-CM | POA: Diagnosis not present

## 2017-03-07 DIAGNOSIS — N182 Chronic kidney disease, stage 2 (mild): Secondary | ICD-10-CM | POA: Diagnosis not present

## 2017-03-12 ENCOUNTER — Ambulatory Visit (HOSPITAL_COMMUNITY): Payer: Medicare HMO | Admitting: Oncology

## 2017-03-12 ENCOUNTER — Other Ambulatory Visit (HOSPITAL_COMMUNITY): Payer: Medicare HMO

## 2017-04-04 DIAGNOSIS — R809 Proteinuria, unspecified: Secondary | ICD-10-CM | POA: Diagnosis not present

## 2017-04-04 DIAGNOSIS — N182 Chronic kidney disease, stage 2 (mild): Secondary | ICD-10-CM | POA: Diagnosis not present

## 2017-04-06 DIAGNOSIS — Z961 Presence of intraocular lens: Secondary | ICD-10-CM | POA: Diagnosis not present

## 2017-04-06 DIAGNOSIS — Z79899 Other long term (current) drug therapy: Secondary | ICD-10-CM | POA: Diagnosis not present

## 2017-04-06 DIAGNOSIS — Z7984 Long term (current) use of oral hypoglycemic drugs: Secondary | ICD-10-CM | POA: Diagnosis not present

## 2017-04-06 DIAGNOSIS — R69 Illness, unspecified: Secondary | ICD-10-CM | POA: Diagnosis not present

## 2017-04-06 DIAGNOSIS — E1136 Type 2 diabetes mellitus with diabetic cataract: Secondary | ICD-10-CM | POA: Diagnosis not present

## 2017-04-06 DIAGNOSIS — Z880 Allergy status to penicillin: Secondary | ICD-10-CM | POA: Diagnosis not present

## 2017-04-06 DIAGNOSIS — H524 Presbyopia: Secondary | ICD-10-CM | POA: Diagnosis not present

## 2017-04-06 DIAGNOSIS — H5202 Hypermetropia, left eye: Secondary | ICD-10-CM | POA: Diagnosis not present

## 2017-04-06 DIAGNOSIS — I10 Essential (primary) hypertension: Secondary | ICD-10-CM | POA: Diagnosis not present

## 2017-04-06 DIAGNOSIS — H52203 Unspecified astigmatism, bilateral: Secondary | ICD-10-CM | POA: Diagnosis not present

## 2017-04-06 DIAGNOSIS — H2512 Age-related nuclear cataract, left eye: Secondary | ICD-10-CM | POA: Diagnosis not present

## 2017-04-06 DIAGNOSIS — H179 Unspecified corneal scar and opacity: Secondary | ICD-10-CM | POA: Diagnosis not present

## 2017-05-09 DIAGNOSIS — N182 Chronic kidney disease, stage 2 (mild): Secondary | ICD-10-CM | POA: Diagnosis not present

## 2017-05-09 DIAGNOSIS — R809 Proteinuria, unspecified: Secondary | ICD-10-CM | POA: Diagnosis not present

## 2017-05-10 DIAGNOSIS — R809 Proteinuria, unspecified: Secondary | ICD-10-CM | POA: Diagnosis not present

## 2017-05-10 DIAGNOSIS — I10 Essential (primary) hypertension: Secondary | ICD-10-CM | POA: Diagnosis not present

## 2017-05-10 DIAGNOSIS — D472 Monoclonal gammopathy: Secondary | ICD-10-CM | POA: Diagnosis not present

## 2017-05-10 DIAGNOSIS — Z720 Tobacco use: Secondary | ICD-10-CM | POA: Diagnosis not present

## 2017-05-10 DIAGNOSIS — E1122 Type 2 diabetes mellitus with diabetic chronic kidney disease: Secondary | ICD-10-CM | POA: Diagnosis not present

## 2017-05-10 DIAGNOSIS — D509 Iron deficiency anemia, unspecified: Secondary | ICD-10-CM | POA: Diagnosis not present

## 2017-05-10 DIAGNOSIS — N041 Nephrotic syndrome with focal and segmental glomerular lesions: Secondary | ICD-10-CM | POA: Diagnosis not present

## 2017-05-10 DIAGNOSIS — N182 Chronic kidney disease, stage 2 (mild): Secondary | ICD-10-CM | POA: Diagnosis not present

## 2017-05-10 DIAGNOSIS — E669 Obesity, unspecified: Secondary | ICD-10-CM | POA: Diagnosis not present

## 2017-05-18 DIAGNOSIS — M47816 Spondylosis without myelopathy or radiculopathy, lumbar region: Secondary | ICD-10-CM | POA: Diagnosis not present

## 2017-06-06 DIAGNOSIS — N182 Chronic kidney disease, stage 2 (mild): Secondary | ICD-10-CM | POA: Diagnosis not present

## 2017-07-09 DIAGNOSIS — N182 Chronic kidney disease, stage 2 (mild): Secondary | ICD-10-CM | POA: Diagnosis not present

## 2017-08-28 DIAGNOSIS — M4716 Other spondylosis with myelopathy, lumbar region: Secondary | ICD-10-CM | POA: Diagnosis not present

## 2017-09-06 DIAGNOSIS — N182 Chronic kidney disease, stage 2 (mild): Secondary | ICD-10-CM | POA: Diagnosis not present

## 2017-09-21 DIAGNOSIS — E1122 Type 2 diabetes mellitus with diabetic chronic kidney disease: Secondary | ICD-10-CM | POA: Diagnosis not present

## 2017-09-21 DIAGNOSIS — D472 Monoclonal gammopathy: Secondary | ICD-10-CM | POA: Diagnosis not present

## 2017-09-21 DIAGNOSIS — E669 Obesity, unspecified: Secondary | ICD-10-CM | POA: Diagnosis not present

## 2017-09-21 DIAGNOSIS — Z720 Tobacco use: Secondary | ICD-10-CM | POA: Diagnosis not present

## 2017-09-21 DIAGNOSIS — N041 Nephrotic syndrome with focal and segmental glomerular lesions: Secondary | ICD-10-CM | POA: Diagnosis not present

## 2017-09-21 DIAGNOSIS — I10 Essential (primary) hypertension: Secondary | ICD-10-CM | POA: Diagnosis not present

## 2017-09-21 DIAGNOSIS — N182 Chronic kidney disease, stage 2 (mild): Secondary | ICD-10-CM | POA: Diagnosis not present

## 2017-09-21 DIAGNOSIS — R809 Proteinuria, unspecified: Secondary | ICD-10-CM | POA: Diagnosis not present

## 2017-09-21 DIAGNOSIS — D631 Anemia in chronic kidney disease: Secondary | ICD-10-CM | POA: Diagnosis not present

## 2017-11-07 DIAGNOSIS — N182 Chronic kidney disease, stage 2 (mild): Secondary | ICD-10-CM | POA: Diagnosis not present

## 2017-12-10 DIAGNOSIS — M47816 Spondylosis without myelopathy or radiculopathy, lumbar region: Secondary | ICD-10-CM | POA: Diagnosis not present

## 2017-12-13 DIAGNOSIS — R69 Illness, unspecified: Secondary | ICD-10-CM | POA: Diagnosis not present

## 2017-12-18 DIAGNOSIS — G8929 Other chronic pain: Secondary | ICD-10-CM | POA: Diagnosis not present

## 2017-12-18 DIAGNOSIS — N189 Chronic kidney disease, unspecified: Secondary | ICD-10-CM | POA: Diagnosis not present

## 2017-12-18 DIAGNOSIS — J449 Chronic obstructive pulmonary disease, unspecified: Secondary | ICD-10-CM | POA: Diagnosis not present

## 2017-12-18 DIAGNOSIS — I129 Hypertensive chronic kidney disease with stage 1 through stage 4 chronic kidney disease, or unspecified chronic kidney disease: Secondary | ICD-10-CM | POA: Diagnosis not present

## 2017-12-18 DIAGNOSIS — Z7952 Long term (current) use of systemic steroids: Secondary | ICD-10-CM | POA: Diagnosis not present

## 2017-12-18 DIAGNOSIS — G4700 Insomnia, unspecified: Secondary | ICD-10-CM | POA: Diagnosis not present

## 2017-12-18 DIAGNOSIS — R69 Illness, unspecified: Secondary | ICD-10-CM | POA: Diagnosis not present

## 2017-12-18 DIAGNOSIS — E1142 Type 2 diabetes mellitus with diabetic polyneuropathy: Secondary | ICD-10-CM | POA: Diagnosis not present

## 2017-12-18 DIAGNOSIS — R609 Edema, unspecified: Secondary | ICD-10-CM | POA: Diagnosis not present

## 2017-12-18 DIAGNOSIS — E1122 Type 2 diabetes mellitus with diabetic chronic kidney disease: Secondary | ICD-10-CM | POA: Diagnosis not present

## 2017-12-19 ENCOUNTER — Emergency Department (HOSPITAL_COMMUNITY)
Admission: EM | Admit: 2017-12-19 | Discharge: 2017-12-19 | Disposition: A | Discharge status: Home / Self Care | Payer: Medicare HMO | Attending: Emergency Medicine | Admitting: Emergency Medicine

## 2017-12-19 ENCOUNTER — Emergency Department (HOSPITAL_COMMUNITY): Payer: Medicare HMO

## 2017-12-19 ENCOUNTER — Other Ambulatory Visit: Payer: Self-pay

## 2017-12-19 ENCOUNTER — Encounter (HOSPITAL_COMMUNITY): Payer: Self-pay | Admitting: *Deleted

## 2017-12-19 DIAGNOSIS — G4489 Other headache syndrome: Secondary | ICD-10-CM | POA: Insufficient documentation

## 2017-12-19 DIAGNOSIS — R69 Illness, unspecified: Secondary | ICD-10-CM | POA: Diagnosis not present

## 2017-12-19 DIAGNOSIS — I110 Hypertensive heart disease with heart failure: Secondary | ICD-10-CM | POA: Insufficient documentation

## 2017-12-19 DIAGNOSIS — E860 Dehydration: Secondary | ICD-10-CM | POA: Diagnosis not present

## 2017-12-19 DIAGNOSIS — R5383 Other fatigue: Secondary | ICD-10-CM | POA: Insufficient documentation

## 2017-12-19 DIAGNOSIS — R262 Difficulty in walking, not elsewhere classified: Secondary | ICD-10-CM | POA: Diagnosis not present

## 2017-12-19 DIAGNOSIS — R059 Cough, unspecified: Secondary | ICD-10-CM

## 2017-12-19 DIAGNOSIS — R05 Cough: Secondary | ICD-10-CM | POA: Insufficient documentation

## 2017-12-19 DIAGNOSIS — J101 Influenza due to other identified influenza virus with other respiratory manifestations: Secondary | ICD-10-CM | POA: Diagnosis not present

## 2017-12-19 DIAGNOSIS — F17210 Nicotine dependence, cigarettes, uncomplicated: Secondary | ICD-10-CM | POA: Diagnosis not present

## 2017-12-19 DIAGNOSIS — R51 Headache: Secondary | ICD-10-CM | POA: Diagnosis present

## 2017-12-19 DIAGNOSIS — Z79899 Other long term (current) drug therapy: Secondary | ICD-10-CM | POA: Diagnosis not present

## 2017-12-19 DIAGNOSIS — M791 Myalgia, unspecified site: Secondary | ICD-10-CM | POA: Insufficient documentation

## 2017-12-19 DIAGNOSIS — R5381 Other malaise: Secondary | ICD-10-CM | POA: Diagnosis not present

## 2017-12-19 DIAGNOSIS — I5032 Chronic diastolic (congestive) heart failure: Secondary | ICD-10-CM | POA: Diagnosis not present

## 2017-12-19 LAB — CBC WITH DIFFERENTIAL/PLATELET
Basophils Absolute: 0 10*3/uL (ref 0.0–0.1)
Basophils Relative: 0 %
Eosinophils Absolute: 0 10*3/uL (ref 0.0–0.7)
Eosinophils Relative: 0 %
HCT: 43 % (ref 39.0–52.0)
Hemoglobin: 13.6 g/dL (ref 13.0–17.0)
Lymphocytes Relative: 11 %
Lymphs Abs: 0.7 10*3/uL (ref 0.7–4.0)
MCH: 27.3 pg (ref 26.0–34.0)
MCHC: 31.6 g/dL (ref 30.0–36.0)
MCV: 86.2 fL (ref 78.0–100.0)
Monocytes Absolute: 1 10*3/uL (ref 0.1–1.0)
Monocytes Relative: 16 %
Neutro Abs: 4.6 10*3/uL (ref 1.7–7.7)
Neutrophils Relative %: 73 %
Platelets: 165 10*3/uL (ref 150–400)
RBC: 4.99 MIL/uL (ref 4.22–5.81)
RDW: 12.5 % (ref 11.5–15.5)
WBC: 6.3 10*3/uL (ref 4.0–10.5)

## 2017-12-19 LAB — BASIC METABOLIC PANEL
Anion gap: 8 (ref 5–15)
BUN: 19 mg/dL (ref 6–20)
CO2: 23 mmol/L (ref 22–32)
Calcium: 8.4 mg/dL — ABNORMAL LOW (ref 8.9–10.3)
Chloride: 107 mmol/L (ref 101–111)
Creatinine, Ser: 1.43 mg/dL — ABNORMAL HIGH (ref 0.61–1.24)
GFR calc Af Amer: 57 mL/min — ABNORMAL LOW (ref >60)
GFR calc non Af Amer: 49 mL/min — ABNORMAL LOW (ref >60)
Glucose, Bld: 106 mg/dL — ABNORMAL HIGH (ref 65–99)
Potassium: 3.5 mmol/L (ref 3.5–5.1)
Sodium: 138 mmol/L (ref 135–145)

## 2017-12-19 LAB — INFLUENZA PANEL BY PCR (TYPE A & B)
Influenza A By PCR: POSITIVE — AB
Influenza B By PCR: NEGATIVE

## 2017-12-19 MED ORDER — DIPHENHYDRAMINE HCL 50 MG/ML IJ SOLN
12.5000 mg | Freq: Once | INTRAMUSCULAR | Status: AC
  Administered 2017-12-19: 12.5 mg via INTRAVENOUS
  Filled 2017-12-19: qty 1

## 2017-12-19 MED ORDER — ACETAMINOPHEN 325 MG PO TABS
650.0000 mg | ORAL_TABLET | Freq: Once | ORAL | Status: AC
  Administered 2017-12-19: 650 mg via ORAL
  Filled 2017-12-19: qty 2

## 2017-12-19 MED ORDER — SODIUM CHLORIDE 0.9 % IV BOLUS (SEPSIS)
1000.0000 mL | Freq: Once | INTRAVENOUS | Status: AC
  Administered 2017-12-19: 1000 mL via INTRAVENOUS

## 2017-12-19 MED ORDER — METOCLOPRAMIDE HCL 5 MG/ML IJ SOLN
5.0000 mg | Freq: Once | INTRAMUSCULAR | Status: AC
  Administered 2017-12-19: 5 mg via INTRAVENOUS
  Filled 2017-12-19: qty 2

## 2017-12-19 NOTE — ED Provider Notes (Signed)
[facility] EMERGENCY DEPARTMENT Provider Note   CSN: [id number] Arrival date & time: 12/19/17  0520     History   Chief Complaint Chief Complaint  Patient presents with  . Headache    HPI Gregory Diaz is a 67 y.o. male.  The history is provided by the patient and the spouse.  Headache   This is a new problem. The current episode started 12 to 24 hours ago. The problem occurs constantly. The problem has been gradually worsening. The pain is located in the frontal region. The quality of the pain is described as sharp. The pain is moderate. The pain does not radiate. Associated symptoms include malaise/fatigue. Pertinent negatives include no vomiting. He has tried nothing for the symptoms.  patient reports onset of headache yesterday.  It took several hours for it to worsen.  He reports nausea.  No known fever.  He does report increasing cough.  There has been some associated myalgias.  No sore throat. No focal weakness, but does report fatigue.  He reports some difficulty walking. Does report since previous back surgery he will have episodes of back pain and leg pain with difficulty ambulating which is not new Past Medical History:  Diagnosis Date  . Abnormal SPEP 09/09/2015  . Anxiety   . Arthritis    DDD, low back  . Early cataracts, bilateral   . Enlarged prostate   . Heart murmur    was told at age 17, but has not had any problems  . Hypertension   . MGUS (monoclonal gammopathy of unknown significance) 01/29/2016    Patient Active Problem List   Diagnosis Date Noted  . MGUS (monoclonal gammopathy of unknown significance) 01/29/2016  . Acute diastolic CHF (congestive heart failure) ([place]) 08/27/2015  . AKI (acute kidney injury) ([place])   . Nephrotic syndrome   . Heart failure with acute decompensation, type unknown ([place]) 08/22/2015  . Acute CHF (congestive heart failure) ([place]) 08/22/2015  . HNP (herniated nucleus pulposus), lumbar 05/31/2015  . Spinal stenosis at L4-L5  level 10/19/2014  . Radiculopathy of leg 10/23/2012  . Hypertension 03/31/2012    Past Surgical History:  Procedure Laterality Date  . BACK SURGERY  2004, 2015  . COLONOSCOPY    . EYE SURGERY     L eye- as a child   . FRACTURE SURGERY Right    thumb  . HEMORROIDECTOMY    . HERNIA REPAIR     umbilical hernia repair  . MASS EXCISION Right 07/11/2016   Procedure: EXCISION 6CM RIGHT SHOULDER MASS;  Surgeon: Vickie Epley, MD;  Location: [facility];  Service: Vascular;  Laterality: Right;  . TONSILLECTOMY         Home Medications    Prior to Admission medications   Medication Sig Start Date End Date Taking? Authorizing Provider  furosemide (LASIX) 80 MG tablet Take 1 tablet (80 mg total) by mouth 2 (two) times daily. 08/27/15   Orson Eva, MD  ibuprofen (ADVIL,MOTRIN) 800 MG tablet Take 1 tablet (800 mg total) by mouth 3 (three) times daily. 05/13/16   Noemi Chapel, MD  lisinopril (PRINIVIL,ZESTRIL) 20 MG tablet Take 20 mg by mouth daily.    [provider]  methocarbamol (ROBAXIN) 500 MG tablet Take 1 tablet (500 mg total) by mouth 2 (two) times daily as needed for muscle spasms. 05/13/16   Noemi Chapel, MD  metolazone (ZAROXOLYN) 2.5 MG tablet Take 2.5 mg by mouth daily. Reported on 01/19/2016    [provider]  oxyCODONE-acetaminophen (PERCOCET) 10-325 MG tablet Take 1 tablet by mouth every 4 (four) hours as needed for pain.    [provider]  predniSONE (DELTASONE) 20 MG tablet Take 20 mg by mouth daily with breakfast. Reported on 05/13/2016    [provider]  ranitidine (ZANTAC) 150 MG capsule Take 150 mg by mouth 2 (two) times daily.    [provider]  senna (SENOKOT) 8.6 MG tablet Take 1 tablet by mouth as needed for constipation. Will take up to 3 if needed    [provider]  sitaGLIPtin (JANUVIA) 50 MG tablet Take 1 tablet (50 mg total) by mouth daily. 08/27/15   Orson Eva, MD    Family History Family History  Problem  Relation Age of Onset  . Gallstones Father   . Ulcers Father   . Alcohol abuse Father   . Diabetes Daughter   . Diabetes Brother   . Diabetes Sister     Social History Social History   Tobacco Use  . Smoking status: Current Every Day Smoker    Packs/day: 0.50    Years: 50.00    Pack years: 25.00    Types: Cigarettes  . Smokeless tobacco: Never Used  Substance Use Topics  . Alcohol use: No  . Drug use: No     Allergies   Penicillins   Review of Systems Review of Systems  Constitutional: Positive for fatigue and malaise/fatigue.  Respiratory: Positive for cough.   Gastrointestinal: Negative for vomiting.  Neurological: Positive for headaches.  All other systems reviewed and are negative.    Physical Exam Updated Vital Signs BP (!) 149/77   Pulse 62   Temp 100.3 F (37.9 C) (Oral)   Resp 19   Ht 1.88 m (6\' 2" )   Wt 119.7 kg (264 lb)   SpO2 93%   BMI 33.90 kg/m   Physical Exam CONSTITUTIONAL: Well developed/well nourished, no acute distress, conversant and appropriate HEAD: Normocephalic/atraumatic EYES: EOMI/PERRL, no nystagmus,  no ptosis ENMT: Mucous membranes moist NECK: supple no meningeal signs CV: S1/S2 noted, no murmurs/rubs/gallops noted LUNGS: Lungs are clear to auscultation bilaterally, no apparent distress ABDOMEN: soft, nontender, no rebound or guarding GU:no cva tenderness NEURO:Awake/alert, face symmetric, no arm or leg drift is noted Equal 5/5 strength with shoulder abduction, and equal hand grips bilaterally Equal 5/5 strength with hip flexion Cranial nerves 3/4/5/6/05/21/09/11/12 tested and intact No past pointing Sensation to light touch intact in all extremities EXTREMITIES: pulses normal, full ROM SKIN: warm, color normal PSYCH: no abnormalities of mood noted    ED Treatments / Results  Labs (all labs ordered are listed, but only abnormal results are displayed) Labs Reviewed  BASIC METABOLIC PANEL - Abnormal; Notable for the  following components:      Result Value   Glucose, Bld 106 (*)    Creatinine, Ser 1.43 (*)    Calcium 8.4 (*)    GFR calc non Af Amer 49 (*)    GFR calc Af Amer 57 (*)    All other components within normal limits  CBC WITH DIFFERENTIAL/PLATELET  INFLUENZA PANEL BY PCR (TYPE A & B)    EKG  EKG Interpretation  Date/Time:  Wednesday December 19 2017 05:39:44 EST Ventricular Rate:  66 PR Interval:    QRS Duration: 104 QT Interval:  408 QTC Calculation: 428 R Axis:   -21 Text Interpretation:  Sinus rhythm Borderline left axis deviation Abnormal T, consider ischemia, diffuse leads Confirmed by Ripley Fraise [id number]) on 12/19/2017 6:17:23  AM       Radiology No results found.  Procedures Procedures    Medications Ordered in ED Medications  acetaminophen (TYLENOL) tablet 650 mg (650 mg Oral Given 12/19/17 0658)  metoCLOPramide (REGLAN) injection 5 mg (5 mg Intravenous Given 12/19/17 0658)  diphenhydrAMINE (BENADRYL) injection 12.5 mg (12.5 mg Intravenous Given 12/19/17 0659)  sodium chloride 0.9 % bolus 1,000 mL (1,000 mLs Intravenous New Bag/Given 12/19/17 0629)     Initial Impression / Assessment and Plan / ED Course  I have reviewed the triage vital signs and the nursing notes.  Pertinent labs & imaging results that were available during my care of the patient were reviewed by me and considered in my medical decision making (see chart for details).     7:13 AM Pt with HA/cough/myalgias Overall he is well appearing No focal weakness noted No meningeal signs He ambulated in the ER to his room without ataxia per nursing It is possible this represents flu with increasing cough/HA reported He is resting after IV fluids and meds  At signout to dr Wilson Singer, plan to monitor in the ED until labs/imaging results If symptoms improve and he is ambulatory, he may be discharged.  If HA persists he may require neuroimaging   Final Clinical Impressions(s) / ED Diagnoses   Final  diagnoses:  None    ED Discharge Orders    None       Ripley Fraise, MD 12/19/17 [phone]

## 2017-12-19 NOTE — ED Notes (Signed)
Pt advised to take tylenol as soon as he gets home. Pt agreed

## 2017-12-19 NOTE — ED Triage Notes (Signed)
Pt c/o headache to the front of his head; pt states he has notices his gait is different than usual

## 2018-01-09 DIAGNOSIS — N182 Chronic kidney disease, stage 2 (mild): Secondary | ICD-10-CM | POA: Diagnosis not present

## 2018-01-23 ENCOUNTER — Telehealth (HOSPITAL_COMMUNITY): Payer: Self-pay | Admitting: *Deleted

## 2018-01-23 NOTE — Telephone Encounter (Signed)
Reviewed chart. Patient has not been seen in the cancer center since 2017. He was only seen once by Dr. Whitney Muse and was supposed to follow up in 6 months but cancelled that appt. Attempted to call patient to let him know to follow up with his PCP or go to ER but was unable to reach him or leave a message at either number listed.

## 2018-01-25 DIAGNOSIS — N182 Chronic kidney disease, stage 2 (mild): Secondary | ICD-10-CM | POA: Diagnosis not present

## 2018-01-25 DIAGNOSIS — D472 Monoclonal gammopathy: Secondary | ICD-10-CM | POA: Diagnosis not present

## 2018-01-25 DIAGNOSIS — R809 Proteinuria, unspecified: Secondary | ICD-10-CM | POA: Diagnosis not present

## 2018-01-25 DIAGNOSIS — D631 Anemia in chronic kidney disease: Secondary | ICD-10-CM | POA: Diagnosis not present

## 2018-01-25 DIAGNOSIS — E669 Obesity, unspecified: Secondary | ICD-10-CM | POA: Diagnosis not present

## 2018-01-25 DIAGNOSIS — Z720 Tobacco use: Secondary | ICD-10-CM | POA: Diagnosis not present

## 2018-01-25 DIAGNOSIS — I129 Hypertensive chronic kidney disease with stage 1 through stage 4 chronic kidney disease, or unspecified chronic kidney disease: Secondary | ICD-10-CM | POA: Diagnosis not present

## 2018-01-25 DIAGNOSIS — N041 Nephrotic syndrome with focal and segmental glomerular lesions: Secondary | ICD-10-CM | POA: Diagnosis not present

## 2018-01-25 DIAGNOSIS — E1122 Type 2 diabetes mellitus with diabetic chronic kidney disease: Secondary | ICD-10-CM | POA: Diagnosis not present

## 2018-01-28 DIAGNOSIS — E6609 Other obesity due to excess calories: Secondary | ICD-10-CM | POA: Diagnosis not present

## 2018-01-28 DIAGNOSIS — Z1389 Encounter for screening for other disorder: Secondary | ICD-10-CM | POA: Diagnosis not present

## 2018-01-28 DIAGNOSIS — E782 Mixed hyperlipidemia: Secondary | ICD-10-CM | POA: Diagnosis not present

## 2018-01-28 DIAGNOSIS — I10 Essential (primary) hypertension: Secondary | ICD-10-CM | POA: Diagnosis not present

## 2018-01-28 DIAGNOSIS — Z6834 Body mass index (BMI) 34.0-34.9, adult: Secondary | ICD-10-CM | POA: Diagnosis not present

## 2018-01-28 DIAGNOSIS — E1165 Type 2 diabetes mellitus with hyperglycemia: Secondary | ICD-10-CM | POA: Diagnosis not present

## 2018-01-28 DIAGNOSIS — D472 Monoclonal gammopathy: Secondary | ICD-10-CM | POA: Diagnosis not present

## 2018-03-06 DIAGNOSIS — M47816 Spondylosis without myelopathy or radiculopathy, lumbar region: Secondary | ICD-10-CM | POA: Diagnosis not present

## 2018-03-06 DIAGNOSIS — M4716 Other spondylosis with myelopathy, lumbar region: Secondary | ICD-10-CM | POA: Diagnosis not present

## 2018-03-12 DIAGNOSIS — H179 Unspecified corneal scar and opacity: Secondary | ICD-10-CM | POA: Diagnosis not present

## 2018-03-12 DIAGNOSIS — Z961 Presence of intraocular lens: Secondary | ICD-10-CM | POA: Diagnosis not present

## 2018-03-12 DIAGNOSIS — H2512 Age-related nuclear cataract, left eye: Secondary | ICD-10-CM | POA: Diagnosis not present

## 2018-03-12 DIAGNOSIS — H52209 Unspecified astigmatism, unspecified eye: Secondary | ICD-10-CM | POA: Diagnosis not present

## 2018-03-12 DIAGNOSIS — H35361 Drusen (degenerative) of macula, right eye: Secondary | ICD-10-CM | POA: Diagnosis not present

## 2018-03-12 DIAGNOSIS — H524 Presbyopia: Secondary | ICD-10-CM | POA: Diagnosis not present

## 2018-03-12 DIAGNOSIS — H5210 Myopia, unspecified eye: Secondary | ICD-10-CM | POA: Diagnosis not present

## 2018-03-12 DIAGNOSIS — H269 Unspecified cataract: Secondary | ICD-10-CM | POA: Diagnosis not present

## 2018-03-20 DIAGNOSIS — M4807 Spinal stenosis, lumbosacral region: Secondary | ICD-10-CM | POA: Diagnosis not present

## 2018-03-20 DIAGNOSIS — M47816 Spondylosis without myelopathy or radiculopathy, lumbar region: Secondary | ICD-10-CM | POA: Diagnosis not present

## 2018-03-20 DIAGNOSIS — Z981 Arthrodesis status: Secondary | ICD-10-CM | POA: Diagnosis not present

## 2018-03-20 DIAGNOSIS — M4805 Spinal stenosis, thoracolumbar region: Secondary | ICD-10-CM | POA: Diagnosis not present

## 2018-03-20 DIAGNOSIS — I700 Atherosclerosis of aorta: Secondary | ICD-10-CM | POA: Diagnosis not present

## 2018-03-20 DIAGNOSIS — M545 Low back pain: Secondary | ICD-10-CM | POA: Diagnosis not present

## 2018-03-22 ENCOUNTER — Ambulatory Visit: Payer: Medicare HMO | Admitting: Urology

## 2018-03-22 ENCOUNTER — Other Ambulatory Visit: Payer: Self-pay | Admitting: Urology

## 2018-03-22 DIAGNOSIS — R972 Elevated prostate specific antigen [PSA]: Secondary | ICD-10-CM

## 2018-03-25 DIAGNOSIS — E6609 Other obesity due to excess calories: Secondary | ICD-10-CM | POA: Diagnosis not present

## 2018-03-25 DIAGNOSIS — N492 Inflammatory disorders of scrotum: Secondary | ICD-10-CM | POA: Diagnosis not present

## 2018-03-25 DIAGNOSIS — Z1389 Encounter for screening for other disorder: Secondary | ICD-10-CM | POA: Diagnosis not present

## 2018-03-25 DIAGNOSIS — Z6832 Body mass index (BMI) 32.0-32.9, adult: Secondary | ICD-10-CM | POA: Diagnosis not present

## 2018-04-05 DIAGNOSIS — M47816 Spondylosis without myelopathy or radiculopathy, lumbar region: Secondary | ICD-10-CM | POA: Diagnosis not present

## 2018-04-19 ENCOUNTER — Ambulatory Visit (HOSPITAL_COMMUNITY)
Admission: RE | Admit: 2018-04-19 | Discharge: 2018-04-19 | Disposition: A | Discharge status: Home / Self Care | Payer: Medicare HMO | Source: Ambulatory Visit | Attending: Urology | Admitting: Urology

## 2018-04-19 DIAGNOSIS — R972 Elevated prostate specific antigen [PSA]: Secondary | ICD-10-CM | POA: Diagnosis present

## 2018-04-19 DIAGNOSIS — D075 Carcinoma in situ of prostate: Secondary | ICD-10-CM | POA: Diagnosis not present

## 2018-04-19 DIAGNOSIS — C61 Malignant neoplasm of prostate: Secondary | ICD-10-CM | POA: Diagnosis not present

## 2018-04-19 MED ORDER — LIDOCAINE HCL (PF) 2 % IJ SOLN
INTRAMUSCULAR | Status: AC
  Administered 2018-04-19: 10 mL
  Filled 2018-04-19: qty 10

## 2018-04-19 MED ORDER — GENTAMICIN SULFATE 40 MG/ML IJ SOLN
80.0000 mg | Freq: Once | INTRAMUSCULAR | Status: AC
  Administered 2018-04-19: 80 mg via INTRAMUSCULAR

## 2018-04-19 MED ORDER — LIDOCAINE HCL (PF) 2 % IJ SOLN
10.0000 mL | Freq: Once | INTRAMUSCULAR | Status: AC
Start: 2018-04-19 — End: 2018-04-19
  Administered 2018-04-19: 10 mL

## 2018-04-19 MED ORDER — GENTAMICIN SULFATE 40 MG/ML IJ SOLN
INTRAMUSCULAR | Status: AC
  Administered 2018-04-19: 80 mg via INTRAMUSCULAR
  Filled 2018-04-19: qty 2

## 2018-04-19 NOTE — Consent Form (Signed)
CLINICAL DATA:    Ultrasound was provided for use by the ordering physician, and a technical  charge was applied by the performing facility.  No radiologist  interpretation/professional services rendered.   

## 2018-04-29 DIAGNOSIS — N182 Chronic kidney disease, stage 2 (mild): Secondary | ICD-10-CM | POA: Diagnosis not present

## 2018-05-01 ENCOUNTER — Ambulatory Visit (INDEPENDENT_AMBULATORY_CARE_PROVIDER_SITE_OTHER): Payer: Medicare HMO | Admitting: Urology

## 2018-05-01 DIAGNOSIS — R3915 Urgency of urination: Secondary | ICD-10-CM | POA: Diagnosis not present

## 2018-05-01 DIAGNOSIS — N201 Calculus of ureter: Secondary | ICD-10-CM

## 2018-05-01 DIAGNOSIS — C61 Malignant neoplasm of prostate: Secondary | ICD-10-CM | POA: Diagnosis not present

## 2018-05-03 ENCOUNTER — Encounter: Payer: Self-pay | Admitting: Radiation Oncology

## 2018-05-15 DIAGNOSIS — M47816 Spondylosis without myelopathy or radiculopathy, lumbar region: Secondary | ICD-10-CM | POA: Diagnosis not present

## 2018-05-15 DIAGNOSIS — M4716 Other spondylosis with myelopathy, lumbar region: Secondary | ICD-10-CM | POA: Diagnosis not present

## 2018-05-28 ENCOUNTER — Encounter: Payer: Self-pay | Admitting: Radiation Oncology

## 2018-05-28 DIAGNOSIS — Z6831 Body mass index (BMI) 31.0-31.9, adult: Secondary | ICD-10-CM | POA: Diagnosis not present

## 2018-05-28 DIAGNOSIS — L0882 Omphalitis not of newborn: Secondary | ICD-10-CM | POA: Diagnosis not present

## 2018-05-28 DIAGNOSIS — L729 Follicular cyst of the skin and subcutaneous tissue, unspecified: Secondary | ICD-10-CM | POA: Diagnosis not present

## 2018-05-28 DIAGNOSIS — E6609 Other obesity due to excess calories: Secondary | ICD-10-CM | POA: Diagnosis not present

## 2018-05-28 NOTE — Progress Notes (Signed)
GU Location of Tumor / Histology: prostatic adenocarcinoma  If Prostate Cancer, Gleason Score is (3 + 4) and PSA is (6.9). Prostate volume: 81.8 grams.  Gregory A Munyon Sr. was referred by Dr. Armandina Gemma to Dr. Alyson Ingles in May 2019 for further evaluation of an elevated PSA. Patient reports he was first told his PSA was elevated in 2016. Patient states, "my doctor told me if it stayed below 6 we were good."   Biopsies of prostate (if applicable) revealed:    Past/Anticipated interventions by urology, if any: prostate biopsy, prescribed Uroxatral 10 mg qhs, referral to radiation oncology for consideration of IMRT vs. brachytherapy  Past/Anticipated interventions by medical oncology, if any: no  Weight changes, if any: no  Bowel/Bladder complaints, if any: Reports urinary urgency, frequency, nocturia and ED. Reports nocturia reduced from 7 to 3 with Uroxatral. Reports dysuria is slowly improving since biopsy. Denies hematuria, leakage or incontinence.   Nausea/Vomiting, if any: no  Pain issues, if any:  Intermittent back spasms managed with percocet  SAFETY ISSUES:  Prior radiation? no  Pacemaker/ICD? no  Possible current pregnancy? no  Is the patient on methotrexate? no  Current Complaints / other details:  67 year old male. Married. Retired. IY:MEBRAXENMM. Smoked since 1989. Sister with hx of breast ca. Resides in [place]. Accompanied by son and daughter in law.

## 2018-05-30 ENCOUNTER — Other Ambulatory Visit: Payer: Self-pay

## 2018-05-30 ENCOUNTER — Encounter: Payer: Self-pay | Admitting: Radiation Oncology

## 2018-05-30 ENCOUNTER — Encounter: Payer: Self-pay | Admitting: Medical Oncology

## 2018-05-30 ENCOUNTER — Ambulatory Visit
Admission: RE | Admit: 2018-05-30 | Discharge: 2018-05-30 | Disposition: A | Discharge status: Home / Self Care | Payer: Medicare HMO | Source: Ambulatory Visit | Attending: Radiation Oncology | Admitting: Radiation Oncology

## 2018-05-30 ENCOUNTER — Encounter

## 2018-05-30 VITALS — BP 115/69 | HR 57 | Temp 97.8°F | Resp 20 | Ht 74.0 in | Wt 233.8 lb

## 2018-05-30 DIAGNOSIS — C61 Malignant neoplasm of prostate: Secondary | ICD-10-CM

## 2018-05-30 DIAGNOSIS — F419 Anxiety disorder, unspecified: Secondary | ICD-10-CM | POA: Diagnosis not present

## 2018-05-30 DIAGNOSIS — N401 Enlarged prostate with lower urinary tract symptoms: Secondary | ICD-10-CM | POA: Diagnosis not present

## 2018-05-30 DIAGNOSIS — I10 Essential (primary) hypertension: Secondary | ICD-10-CM | POA: Diagnosis not present

## 2018-05-30 DIAGNOSIS — R69 Illness, unspecified: Secondary | ICD-10-CM | POA: Diagnosis not present

## 2018-05-30 DIAGNOSIS — R972 Elevated prostate specific antigen [PSA]: Secondary | ICD-10-CM | POA: Diagnosis not present

## 2018-05-30 DIAGNOSIS — F17210 Nicotine dependence, cigarettes, uncomplicated: Secondary | ICD-10-CM | POA: Diagnosis not present

## 2018-05-30 DIAGNOSIS — Z79899 Other long term (current) drug therapy: Secondary | ICD-10-CM | POA: Insufficient documentation

## 2018-05-30 HISTORY — DX: Malignant neoplasm of prostate: C61

## 2018-05-30 NOTE — Progress Notes (Signed)
Introduced myself to Gregory Diaz and his family as the prostate nurse navigator and my role. He states his PSA has been slowly rising since 2016 and had his first biopsy in June. He has no family history of prostate cancer but he does have a sister with breast. He states he has limited knowledge of treatment options and hopes to know more after his consult. I gave them my business card and asked them to call me with questions or concerns. He voiced understanding.

## 2018-05-30 NOTE — Progress Notes (Signed)
Radiation Oncology         [phone]) [phone] ________________________________  Initial outpatient Consultation  Name: Gregory A Lung Sr. MRN: [id number]  Date: 05/30/2018  DOB: Jun 03, 1951  HW:[id number], Gregory Reichmann, MD  McKenzie, Gregory Furbish, MD   REFERRING PHYSICIAN: Cleon Gustin, MD  DIAGNOSIS: 67 y.o. gentleman with Stage T1c adenocarcinoma of the prostate with Gleason Score of 3+4, and PSA of 6.9.    ICD-10-CM   1. Malignant neoplasm of prostate ([place]) C61 Ambulatory referral to Social Work    HISTORY OF PRESENT ILLNESS: Gregory A Hedglin Sr. is a 67 y.o. male with a diagnosis of prostate cancer. He has a history of elevated PSA since 09/2016 when he PSA was found to be 4.2 at the time of CPE with his primary care physician, Dr. Sharilyn Sites, MD.  A repeat PSA in 10/2016 was 5.7 and up to 6.9 on 01/28/18.  Accordingly, he was referred for evaluation in urology by Dr. Alyson Ingles, Gregory Furbish, MD on 03/22/2018 where a digital rectal examination was performed at that time revealing symmetrical lobes and no nodularity.  The patient proceeded to transrectal ultrasound with 12 biopsies of the prostate on 04/19/2018.  The prostate volume measured 81.8 grams.  Out of 12 core biopsies,1 was positive.  The maximum Gleason score was 3+4=7, and this was seen in right apex lateral.  He has has significant LUTS, worsening over the years so he was recently started on Uroxatral 10mg  po qhs with improvement in his sxs.  He denies a family history of prostate cancer.     The patient reviewed the biopsy results with his urologist and he has kindly been referred today for discussion of potential radiation treatment options.  [name] is accompanied by his daughter and son. He is doing well overall. He reports having a few urinary issues including frequency, urgency, dysuria and ED which were exacerbated with TRUSPBx. Since his biopsy his dysuria has improved. Prior to starting Uroxatral, he experienced nocturia x 7 but since  starting the medication, his symptoms have improved with nocturia 3-4 times a night. He specifically denies dysuria,  hematuria, leakage, urinary incontinence, lower extremity edema, diarrhea, constipation, nausea and vomiting.    PREVIOUS RADIATION THERAPY: No  PAST MEDICAL HISTORY:  Past Medical History:  Diagnosis Date  . Abnormal SPEP 09/09/2015  . Anxiety   . Arthritis    DDD, low back  . Early cataracts, bilateral   . Enlarged prostate   . Heart murmur    was told at age 35, but has not had any problems  . Hypertension   . MGUS (monoclonal gammopathy of unknown significance) 01/29/2016  . Prostate cancer ([place])       PAST SURGICAL HISTORY: Past Surgical History:  Procedure Laterality Date  . BACK SURGERY  2004, 2015  . COLONOSCOPY    . EYE SURGERY     L eye- as a child   . FRACTURE SURGERY Right    thumb  . HEMORROIDECTOMY    . HERNIA REPAIR     umbilical hernia repair  . MASS EXCISION Right 07/11/2016   Procedure: EXCISION 6CM RIGHT SHOULDER MASS;  Surgeon: Vickie Epley, MD;  Location: [facility];  Service: Vascular;  Laterality: Right;  . PROSTATE BIOPSY    . TONSILLECTOMY      FAMILY HISTORY:  Family History  Problem Relation Age of Onset  . Gallstones Father   . Ulcers Father   . Alcohol abuse Father   . Diabetes Daughter   .  Diabetes Brother   . Diabetes Sister   . Breast cancer Sister     SOCIAL HISTORY:  Social History   Socioeconomic History  . Marital status: Married    Spouse name: Not on file  . Number of children: Not on file  . Years of education: Not on file  . Highest education level: Not on file  Occupational History  . Occupation: retire    Comment: 2014; truck [employer] operational officer  . Financial resource strain: Not on file  . Food insecurity:    Worry: Not on file    Inability: Not on file  . Transportation needs:    Medical: Not on file    Non-medical: Not on file  Tobacco Use  . Smoking status: Current Every Day Smoker     Packs/day: 0.50    Years: 50.00    Pack years: 25.00    Types: Cigarettes  . Smokeless tobacco: Never Used  Substance and Sexual Activity  . Alcohol use: No  . Drug use: No  . Sexual activity: Yes    Birth control/protection: None  Lifestyle  . Physical activity:    Days per week: Not on file    Minutes per session: Not on file  . Stress: Not on file  Relationships  . Social connections:    Talks on phone: Not on file    Gets together: Not on file    Attends religious service: Not on file    Active member of club or organization: Not on file    Attends meetings of clubs or organizations: Not on file    Relationship status: Not on file  . Intimate partner violence:    Fear of current or ex partner: Not on file    Emotionally abused: Not on file    Physically abused: Not on file    Forced sexual activity: Not on file  Other Topics Concern  . Not on file  Social History Narrative  . Not on file    ALLERGIES: Penicillins  MEDICATIONS:  Current Outpatient Medications  Medication Sig Dispense Refill  . gabapentin (NEURONTIN) 300 MG capsule Take by mouth.    Marland Kitchen lisinopril (PRINIVIL,ZESTRIL) 20 MG tablet Take 20 mg by mouth daily.    Marland Kitchen oxyCODONE-acetaminophen (PERCOCET) 10-325 MG tablet Take 1 tablet by mouth every 4 (four) hours as needed for pain.    . ranitidine (ZANTAC) 150 MG capsule Take 150 mg by mouth 2 (two) times daily.    Marland Kitchen senna (SENOKOT) 8.6 MG tablet Take 1 tablet by mouth as needed for constipation. Will take up to 3 if needed    . sitaGLIPtin (JANUVIA) 50 MG tablet Take by mouth.    . traZODone (DESYREL) 50 MG tablet   2  . valACYclovir (VALTREX) 500 MG tablet     . furosemide (LASIX) 80 MG tablet Take 1 tablet (80 mg total) by mouth 2 (two) times daily. (Patient not taking: Reported on 05/30/2018) 60 tablet 1   No current facility-administered medications for this encounter.    Facility-Administered Medications Ordered in Other Encounters  Medication Dose  Route Frequency Provider Last Rate Last Dose  . Chlorhexidine Gluconate Cloth 2 % PADS 6 each  6 each Topical Once Vickie Epley, MD       And  . Chlorhexidine Gluconate Cloth 2 % PADS 6 each  6 each Topical Once Vickie Epley, MD        REVIEW OF SYSTEMS:  On review of systems,  the patient reports that he is doing well overall. He denies any chest pain, shortness of breath, cough, fevers, chills, night sweats, unintended weight changes. He denies any bowel disturbances, and denies abdominal pain, nausea or vomiting. He denies any new musculoskeletal or joint aches or pains. His current IPSS is 7, indicating MILD urinary symptoms on Uroatral. He reports ED and indicates that he is almost never able to complete sexual activity.  A complete review of systems is obtained and is otherwise negative.    PHYSICAL EXAM:  Wt Readings from Last 3 Encounters:  05/30/18 233 lb 12.8 oz (106.1 kg)  12/19/17 264 lb (119.7 kg)  09/12/16 239 lb (108.4 kg)   Temp Readings from Last 3 Encounters:  05/30/18 97.8 F (36.6 C) (Oral)  04/19/18 98 F (36.7 C) (Oral)  12/19/17 (!) 100.5 F (38.1 C) (Oral)   BP Readings from Last 3 Encounters:  05/30/18 115/69  04/19/18 138/72  12/19/17 132/75   Pulse Readings from Last 3 Encounters:  05/30/18 (!) 57  04/19/18 70  12/19/17 67   Pain Assessment Pain Score: 0-No pain/10  In general this is a well appearing African American male in no acute distress. He is alert and oriented x4 and appropriate throughout the examination. HEENT reveals that the patient is normocephalic, atraumatic. EOMs are intact. PERRLA. Skin is intact without any evidence of gross lesions. Cardiovascular exam reveals a regular rate and rhythm, no clicks rubs or murmurs are auscultated. Chest is clear to auscultation bilaterally. Lymphatic assessment is performed and does not reveal any adenopathy in the cervical, supraclavicular, axillary, or inguinal chains. Abdomen has active  bowel sounds in all quadrants and is intact. The abdomen is soft, non tender, non distended. Lower extremities are negative for pretibial pitting edema, deep calf tenderness, cyanosis or clubbing.   KPS = 100  100 - Normal; no complaints; no evidence of disease. 90   - Able to carry on normal activity; minor signs or symptoms of disease. 80   - Normal activity with effort; some signs or symptoms of disease. 87   - Cares for self; unable to carry on normal activity or to do active work. 60   - Requires occasional assistance, but is able to care for most of his personal needs. 50   - Requires considerable assistance and frequent medical care. 68   - Disabled; requires special care and assistance. 7   - Severely disabled; hospital admission is indicated although death not imminent. 20   - Very sick; hospital admission necessary; active supportive treatment necessary. 10   - Moribund; fatal processes progressing rapidly. 0     - Dead  Karnofsky DA, Abelmann Bieber, Craver LS and Burchenal [facility] [phone]) The use of the nitrogen mustards in the palliative treatment of carcinoma: with particular reference to bronchogenic carcinoma Cancer 1 634-56  LABORATORY DATA:  Lab Results  Component Value Date   WBC 6.3 12/19/2017   HGB 13.6 12/19/2017   HCT 43.0 12/19/2017   MCV 86.2 12/19/2017   PLT 165 12/19/2017   Lab Results  Component Value Date   NA 138 12/19/2017   K 3.5 12/19/2017   CL 107 12/19/2017   CO2 23 12/19/2017   Lab Results  Component Value Date   ALT 13 (L) 09/12/2016   AST 16 09/12/2016   ALKPHOS 63 09/12/2016   BILITOT 0.8 09/12/2016     RADIOGRAPHY: No results found.    IMPRESSION/PLAN: 1. 67 y.o. gentleman with Stage T1c adenocarcinoma of  the prostate with Gleason Score of 3+4, and PSA of 6.9.  We discussed the patient's workup and outlined the nature of prostate cancer in this setting. The patient's T stage, Gleason's score, and PSA put him into the favorable intermediate  risk group. Accordingly, he is eligible for a variety of potential treatment options including prostatectomy, brachytherapy or a 5.5 week course of external radiation. We discussed the available radiation techniques, and focused on the details and logistics and delivery. The patient may not be an ideal candidate for brachytherapy boost with a prostate volume of 81.8 prior to downsizing from hormone therapy. We discussed that based on his prostate volume, he would require beginning treatment with a 5 alpha reductase inhibitor and ADT for at least 3 months to allow for downsizing of the prostate prior to initiating radiotherapy. We would need to repeat prostate imaging after 3 months of therapy to reassess the prostate volume at that time and confirm whether he is a candidate for radiotherapy. He is not interested in ADT and prefers to begin treatment as soon as possible so he is not interested in brachytherapy. We discussed and outlined the risks, benefits, short and long-term effects associated with external beam radiotherapy and compared and contrasted these with prostatectomy. We discussed the role of SpaceOAR in reducing the rectal toxicity associated with radiotherapy.  He and his family were encouraged to ask questions that were answered to their stated satisfaction.  At the conclusion of our conversation, he remains undecided regarding his final treatment preference but appears to be leaning towards prostatectomy though still giving consideration to a 5.5 week course of prostate IMRT.  He lives in [place] so we briefly discussed the potential for external beam radiotherapy in [place] but he reports that [place] is almost equal distance from his house and therefore, if he elects for EBRT, he would prefer to have treatment in [place].  We will share our findings with Dr. Alyson Ingles and plan to follow up with the patient in the next 1-2 weeks if we have not heard from him first, to ascertain his readiness  to commit to treatment of his choice.  Should he elect to proceed with prostate IMRT, we will move forward with coordinating for fiducial markers and SpaceOAR prior to CT SIM.  We enjoyed meeting him and his family today and would be happy to participate in his care if he so chooses.  We spent 60 minutes minutes face to face with the patient and more than 50% of that time was spent in counseling and/or coordination of care.    Nicholos Johns, PA-C    Tyler Pita, MD  [facility] Direct Dial: [phone]  Fax: [phone] [email or web address]  Skype  LinkedIn    Page Me    This document serves as a record of services personally performed by Tyler Pita, MD and Nicholos Johns, PA-C. It was created on their behalf by Vanessa Ralphs, a trained medical scribe. The creation of this record is based on the scribe's personal observations and the provider's statements to them. This document has been checked and approved by the attending provider.

## 2018-05-30 NOTE — Progress Notes (Signed)
See progress note under physician encounter. 

## 2018-05-31 ENCOUNTER — Encounter: Payer: Self-pay | Admitting: General Practice

## 2018-05-31 NOTE — Progress Notes (Signed)
[facility] Psychosocial Distress Screening Clinical Social Work  Clinical Social Work was referred by distress screening protocol.  The patient scored a 5 on the Psychosocial Distress Thermometer which indicates moderate distress. Clinical Social Worker contacted patient by phone to assess for distress and other psychosocial needs. "Still unsure about what to do now...", thinking about treatment choices.  Recently lost air conditioning, concerned that he may need to replace his unit.  "I have a good support system right now", does not anticipate transportation difficulties.  Concerned about regaining fitness after treatment.  Will mail packet of information on [place] and [facility] resources.    ONCBCN DISTRESS SCREENING 05/30/2018  Screening Type Initial Screening  Distress experienced in past week (1-10) 5  Practical problem type Housing  Emotional problem type Adjusting to illness  Information Concerns Type Lack of info about diagnosis;Lack of info about treatment;Lack of info about complementary therapy choices;Lack of info about maintaining fitness  Physical Problem type Pain;Sleep/insomnia;Loss of appetitie;Changes in urination;Tingling hands/feet  Physician notified of physical symptoms Yes  Referral to clinical psychology No  Referral to clinical social work No  Referral to dietition No  Referral to financial advocate No  Referral to support programs No  Referral to palliative care No    Clinical Social Worker follow up needed: No.  If yes, follow up plan:  Beverely Pace, [place], LCSW Clinical Social Worker Phone:  [phone]

## 2018-06-01 DIAGNOSIS — C61 Malignant neoplasm of prostate: Secondary | ICD-10-CM | POA: Insufficient documentation

## 2018-06-05 ENCOUNTER — Ambulatory Visit: Payer: Medicare HMO | Admitting: Urology

## 2018-06-05 DIAGNOSIS — C61 Malignant neoplasm of prostate: Secondary | ICD-10-CM | POA: Diagnosis not present

## 2018-06-05 DIAGNOSIS — R351 Nocturia: Secondary | ICD-10-CM

## 2018-06-11 ENCOUNTER — Telehealth: Payer: Self-pay | Admitting: Medical Oncology

## 2018-06-11 NOTE — Telephone Encounter (Signed)
Abigail-social worker spoke with patient earlier regarding distress screening and he had questions regarding his treatment. I called him and left a message asking him to call to discuss his questions. I informed him that Shirley-Dr.Manning"s office will call him to schedule gold markers/SpaceOAR.

## 2018-06-14 ENCOUNTER — Telehealth: Payer: Self-pay | Admitting: *Deleted

## 2018-06-14 NOTE — Telephone Encounter (Signed)
Called patient to inform of fid. markers and space oar for 07-04-18 @ [facility] and his sim on August 29 @ 2 pm @ Dr. Johny Shears Office, lvm for a return call

## 2018-06-18 ENCOUNTER — Other Ambulatory Visit: Payer: Self-pay | Admitting: Urology

## 2018-06-18 DIAGNOSIS — C61 Malignant neoplasm of prostate: Secondary | ICD-10-CM

## 2018-06-21 DIAGNOSIS — H2512 Age-related nuclear cataract, left eye: Secondary | ICD-10-CM | POA: Diagnosis not present

## 2018-06-21 DIAGNOSIS — H35361 Drusen (degenerative) of macula, right eye: Secondary | ICD-10-CM | POA: Diagnosis not present

## 2018-06-21 DIAGNOSIS — H5212 Myopia, left eye: Secondary | ICD-10-CM | POA: Diagnosis not present

## 2018-06-21 DIAGNOSIS — H179 Unspecified corneal scar and opacity: Secondary | ICD-10-CM | POA: Diagnosis not present

## 2018-06-21 DIAGNOSIS — H524 Presbyopia: Secondary | ICD-10-CM | POA: Diagnosis not present

## 2018-06-21 DIAGNOSIS — H5231 Anisometropia: Secondary | ICD-10-CM | POA: Diagnosis not present

## 2018-06-21 DIAGNOSIS — Z961 Presence of intraocular lens: Secondary | ICD-10-CM | POA: Diagnosis not present

## 2018-06-24 DIAGNOSIS — N182 Chronic kidney disease, stage 2 (mild): Secondary | ICD-10-CM | POA: Diagnosis not present

## 2018-06-25 ENCOUNTER — Encounter: Payer: Self-pay | Admitting: General Surgery

## 2018-06-25 ENCOUNTER — Ambulatory Visit: Payer: Medicare HMO | Admitting: General Surgery

## 2018-06-25 VITALS — BP 117/77 | HR 49 | Temp 97.7°F | Resp 20 | Wt 241.0 lb

## 2018-06-25 DIAGNOSIS — K429 Umbilical hernia without obstruction or gangrene: Secondary | ICD-10-CM

## 2018-06-25 DIAGNOSIS — T8130XA Disruption of wound, unspecified, initial encounter: Secondary | ICD-10-CM

## 2018-06-25 DIAGNOSIS — T8189XA Other complications of procedures, not elsewhere classified, initial encounter: Secondary | ICD-10-CM

## 2018-06-25 NOTE — Patient Instructions (Addendum)
Inflammation around a suture that is chronic. - Suture granuloma  Suture that is popping out from the skin- Spit suture   -Continue bacitracin to the area daily.

## 2018-06-25 NOTE — Progress Notes (Signed)
[facility] History and Physical  Reason for Referral: Umbilical Cyst? Drainage from umbilicus  Referring Physician: Dr. Brandt Loosen Gregory Propps Sr. is Gregory 67 y.o. male.  HPI: Gregory Diaz is Gregory very sweet 67 yo who has Gregory history of MGUS, prostate cancer (undergoing radiation in late August 2019), anxiety, arthritis who is accompanied today by his son and daughter in law. He comes in with reports that he has had clear fluid draining from his umbilicus and Gregory bulge that has been occurring since about 2008. He had Gregory umbilical hernia repaired with Dr. Tamala Julian in 2005, and he does not believe he used mesh. He says that his hernia was first noted around that time and he had some discomfort but never any issues with incarceration.  He says that starting in about 2008 he started to have some clear fluid drain from his umbilicus. He was told to put ointment on the area with Gregory qtip, and says that at times it had bulged or become swollen.  He says that this really has not occurred since 2015.  He says that the clear drainage has stopped to since he has been doing the ointment (Bactroban) but does have notes from Dr. Delanna Ahmadi office where he went to discuss this drainage June 2019.  He otherwise says he has had no redness or purulent drainage.  He feels discomfort and pulling at the area when he rolls on his side and the umbilicus itself is tender. He denies any nausea/vomiting.  He is also worried about the bulge in his left side of the abdomen. He is worried that he has Gregory bulge in this area and that it is Gregory hernia.  He believes that the left side is asymmetric to the right side of the abdomen.   His family did ask to record the encounter. I am unaware of any specific [facility] Regulations, and allowed them to record as they requested to do this for purposes of explaining the visit to the family (other siblings).   Past Medical History:  Diagnosis Date  . Abnormal SPEP 09/09/2015  . Anxiety   .  Arthritis    DDD, low back  . Early cataracts, bilateral   . Enlarged prostate   . Heart murmur    was told at age 68, but has not had any problems  . Hypertension   . MGUS (monoclonal gammopathy of unknown significance) 01/29/2016  . Prostate cancer [facility])     Past Surgical History:  Procedure Laterality Date  . BACK SURGERY  2004, 2015  . COLONOSCOPY    . EYE SURGERY     L eye- as Gregory child   . FRACTURE SURGERY Right    thumb  . HEMORROIDECTOMY    . HERNIA REPAIR     umbilical hernia repair  . MASS EXCISION Right 07/11/2016   Procedure: EXCISION 6CM RIGHT SHOULDER MASS;  Surgeon: Vickie Epley, MD;  Location: [facility];  Service: Vascular;  Laterality: Right;  . PROSTATE BIOPSY    . TONSILLECTOMY      Family History  Problem Relation Age of Onset  . Gallstones Father   . Ulcers Father   . Alcohol abuse Father   . Diabetes Daughter   . Diabetes Brother   . Diabetes Sister   . Breast cancer Sister     Social History   Tobacco Use  . Smoking status: Current Every Day Smoker    Packs/day: 0.50    Years:  50.00    Pack years: 25.00    Types: Cigarettes  . Smokeless tobacco: Never Used  Substance Use Topics  . Alcohol use: No  . Drug use: No    Medications: I have reviewed the patient's current medications. Allergies as of 06/25/2018      Reactions   Penicillins Swelling   Has patient had Gregory PCN reaction causing immediate rash, facial/tongue/throat swelling, SOB or lightheadedness with hypotension: Yes Has patient had Gregory PCN reaction causing severe rash involving mucus membranes or skin necrosis: No Has patient had Gregory PCN reaction that required hospitalization No Has patient had Gregory PCN reaction occurring within the last 10 years: No If all of the above answers are "NO", then may proceed with Cephalosporin use.      Medication List        Accurate as of 06/25/18 10:24 AM. Always use your most recent med list.          gabapentin 300 MG capsule Commonly known  as:  NEURONTIN Take by mouth.   lisinopril 20 MG tablet Commonly known as:  PRINIVIL,ZESTRIL Take 20 mg by mouth daily.   oxyCODONE-acetaminophen 10-325 MG tablet Commonly known as:  PERCOCET Take 1 tablet by mouth every 4 (four) hours as needed for pain.   ranitidine 150 MG capsule Commonly known as:  ZANTAC Take 150 mg by mouth 2 (two) times daily.   senna 8.6 MG tablet Commonly known as:  SENOKOT Take 1 tablet by mouth as needed for constipation. Will take up to 3 if needed   traZODone 50 MG tablet Commonly known as:  DESYREL   valACYclovir 500 MG tablet Commonly known as:  VALTREX        ROS:  Gregory comprehensive review of systems was negative except for: Cardiovascular: positive for HTN Genitourinary: positive for frequency and prostate cancer Musculoskeletal: positive for back pain, neck pain, stiff joints and sciatica  Blood pressure 117/77, pulse (!) 49, temperature 97.7 F (36.5 C), temperature source Temporal, resp. rate 20, weight 241 lb (109.3 kg). Physical Exam  Constitutional: He appears well-developed and well-nourished. No distress.  HENT:  Head: Normocephalic and atraumatic.  Eyes: Pupils are equal, round, and reactive to light. EOM are normal.  Neck: Normal range of motion. Neck supple.  Cardiovascular: Normal rate and regular rhythm.  Pulmonary/Chest: Effort normal and breath sounds normal.  Abdominal: Soft. He exhibits no distension and no mass. There is no tenderness.  Left lateral abdomen with fatty tissue, no hernia defect noted, umbilicus with nylon like suture exposed and granular polyp at the suture, tender umbilicus, no obvious hernia defect noted in the umbilicus; on standing, right and left abdomen with fatty tissue and no hernia defects, left slightly more fatty bulge but relatively symmetrical  Vitals reviewed.   Results: personally reviewed CT from 2016-  CT Gregory/p 2016- inflammatory reaction/ stranding in the umbilicus, no lateral hernias  noted or umbilical herniation noted, no obvious mesh noted on the CT   Assessment & Plan:  Gregory Gregory Wiltsey Sr. is Gregory 67 y.o. male with Gregory spit suture/ suture granuloma at his previous umbilical hernia repair which appears to have been primary repair and no mesh repair.  He has been having drainage chronically since about 2008 and has had some bulging at time but nothing recently. He also report discomfort in the area and pulling/ tugging sensation.  He likely has Gregory significant amount of inflammation due to the spit suture/ suture granuloma and at times it sounds  like it must get Gregory little more inflamed or infected and drain / bulge.    -Given the chronicity of this issues, I have discussed repair of this with plans to not use any permanent foreign bodies as the patient has spit this suture and has inflammation that has been there for years.  We also cannot rule out Gregory smoldering infection associated with the suture.  I believe revision of the umbilical hernia repair with removal of the permanent sutures and removal of inflammatory tissue from the area is the best option.  We have discussed that in Gregory perfect scenario we would use mesh and permanent sutures but given his presentation this is not advisable due to the risk of infection or him having additional issues with Gregory foreign body reaction.  We have discussed that he has Gregory more significant risk of recurrence but that this is Gregory risk we must take to prevent the same issues he has now.  We have discussed that if Gregory recurrence did happen that we would be able to do Gregory standard repair with mesh/ permanent sutures down the road/ 3 months after.   -Discussed that he had the CT in 2016 and no obvious hernia on the left. Also discussed that clinically I do not feel Gregory hernia, but this does not rule it out. We will repeat Gregory CT Gregory/p to assess for hernia on the left side of the abdomen and also will give [place] more information as to degree of inflammation at the umbilical site  now and if there is Gregory fascia defect which was not present in 2016   -Patient has had this chronically for years, and is managing it now with the bactroban ointment. He is not having any problems, and is due for his radiation in August for his prostate cancer.  He reports this will take about 6-8 weeks to complete. He is going to return in November to discuss the plan for repair and continue his ointment for now.  We are going to go ahead and repeat the CT in the interim.   All questions were answered to the satisfaction of the patient and family.  Gregory Diaz 06/25/2018, 10:24 AM

## 2018-07-02 NOTE — Addendum Note (Signed)
Addended by: Luanne Bras on: 07/02/2018 03:13 PM   Modules accepted: Orders

## 2018-07-04 DIAGNOSIS — C61 Malignant neoplasm of prostate: Secondary | ICD-10-CM | POA: Diagnosis not present

## 2018-07-05 ENCOUNTER — Telehealth: Payer: Self-pay | Admitting: *Deleted

## 2018-07-05 NOTE — Telephone Encounter (Signed)
CALLED PATIENT TO INFORM OF SIM AND MRI FOR 07-11-18, SPOKE WITH PATIENT'S WIFE- LENORA AND SHE IS AWARE OF THESE APPTS.

## 2018-07-11 ENCOUNTER — Ambulatory Visit (HOSPITAL_COMMUNITY)
Admission: RE | Admit: 2018-07-11 | Discharge: 2018-07-11 | Disposition: A | Discharge status: Home / Self Care | Payer: Medicare HMO | Source: Ambulatory Visit | Attending: Urology | Admitting: Urology

## 2018-07-11 ENCOUNTER — Ambulatory Visit
Admission: RE | Admit: 2018-07-11 | Discharge: 2018-07-11 | Disposition: A | Discharge status: Home / Self Care | Payer: Medicare HMO | Source: Ambulatory Visit | Attending: Radiation Oncology | Admitting: Radiation Oncology

## 2018-07-11 ENCOUNTER — Encounter: Payer: Self-pay | Admitting: Medical Oncology

## 2018-07-11 DIAGNOSIS — C61 Malignant neoplasm of prostate: Secondary | ICD-10-CM

## 2018-07-11 NOTE — Progress Notes (Signed)
  Radiation Oncology         [phone]) [phone] ________________________________  Name: Gregory Faster Ayars Sr. MRN: [id number]  Date: 07/11/2018  DOB: 19-Mar-1951  SIMULATION AND TREATMENT PLANNING NOTE    ICD-10-CM   1. Malignant neoplasm of prostate ([place]) C61     DIAGNOSIS:  68 y.o. gentleman with Stage T1c adenocarcinoma of the prostate with Gleason Score of 3+4, and PSA of 6.9.  NARRATIVE:  The patient was brought to the [facility].  Identity was confirmed.  All relevant records and images related to the planned course of therapy were reviewed.  The patient freely provided informed written consent to proceed with treatment after reviewing the details related to the planned course of therapy. The consent form was witnessed and verified by the simulation staff.  Then, the patient was set-up in a stable reproducible supine position for radiation therapy.  A vacuum lock pillow device was custom fabricated to position his legs in a reproducible immobilized position.  Then, I performed a urethrogram under sterile conditions to identify the prostatic apex.  CT images were obtained.  Surface markings were placed.  The CT images were loaded into the planning software.  Then the prostate target and avoidance structures including the rectum, bladder, bowel and hips were contoured.  Treatment planning then occurred.  The radiation prescription was entered and confirmed.  A total of one complex treatment devices was fabricated. I have requested : Intensity Modulated Radiotherapy (IMRT) is medically necessary for this case for the following reason:  Rectal sparing.Marland Kitchen  PLAN:  The patient will receive 70 Gy in 28 fractions.  ________________________________  Sheral Apley Tammi Klippel, M.D.

## 2018-07-17 ENCOUNTER — Telehealth: Payer: Self-pay | Admitting: *Deleted

## 2018-07-17 NOTE — Telephone Encounter (Signed)
Patient's wife dropped off [employer] paper work.

## 2018-07-18 DIAGNOSIS — C61 Malignant neoplasm of prostate: Secondary | ICD-10-CM | POA: Diagnosis not present

## 2018-07-23 ENCOUNTER — Ambulatory Visit
Admission: RE | Admit: 2018-07-23 | Discharge: 2018-07-23 | Disposition: A | Discharge status: Home / Self Care | Payer: Medicare HMO | Source: Ambulatory Visit | Attending: Radiation Oncology | Admitting: Radiation Oncology

## 2018-07-23 DIAGNOSIS — C61 Malignant neoplasm of prostate: Secondary | ICD-10-CM | POA: Diagnosis not present

## 2018-07-23 NOTE — Progress Notes (Signed)
FMLA successfully faxed to FMLA Specialist at [phone]. Mailed copy to patient address on file. 

## 2018-07-24 ENCOUNTER — Ambulatory Visit
Admission: RE | Admit: 2018-07-24 | Discharge: 2018-07-24 | Disposition: A | Discharge status: Home / Self Care | Payer: Medicare HMO | Source: Ambulatory Visit | Attending: Radiation Oncology | Admitting: Radiation Oncology

## 2018-07-24 DIAGNOSIS — C61 Malignant neoplasm of prostate: Secondary | ICD-10-CM | POA: Diagnosis not present

## 2018-07-25 ENCOUNTER — Ambulatory Visit
Admission: RE | Admit: 2018-07-25 | Discharge: 2018-07-25 | Disposition: A | Discharge status: Home / Self Care | Payer: Medicare HMO | Source: Ambulatory Visit | Attending: Radiation Oncology | Admitting: Radiation Oncology

## 2018-07-25 ENCOUNTER — Ambulatory Visit (INDEPENDENT_AMBULATORY_CARE_PROVIDER_SITE_OTHER): Payer: Medicare HMO | Admitting: Otolaryngology

## 2018-07-25 DIAGNOSIS — C61 Malignant neoplasm of prostate: Secondary | ICD-10-CM | POA: Diagnosis not present

## 2018-07-25 DIAGNOSIS — R221 Localized swelling, mass and lump, neck: Secondary | ICD-10-CM

## 2018-07-26 ENCOUNTER — Ambulatory Visit
Admission: RE | Admit: 2018-07-26 | Discharge: 2018-07-26 | Disposition: A | Discharge status: Home / Self Care | Payer: Medicare HMO | Source: Ambulatory Visit | Attending: Radiation Oncology | Admitting: Radiation Oncology

## 2018-07-26 DIAGNOSIS — C61 Malignant neoplasm of prostate: Secondary | ICD-10-CM | POA: Diagnosis not present

## 2018-07-29 ENCOUNTER — Ambulatory Visit
Admission: RE | Admit: 2018-07-29 | Discharge: 2018-07-29 | Disposition: A | Discharge status: Home / Self Care | Payer: Medicare HMO | Source: Ambulatory Visit | Attending: Radiation Oncology | Admitting: Radiation Oncology

## 2018-07-29 DIAGNOSIS — C61 Malignant neoplasm of prostate: Secondary | ICD-10-CM | POA: Diagnosis not present

## 2018-07-30 ENCOUNTER — Telehealth: Payer: Self-pay | Admitting: Radiation Oncology

## 2018-07-30 ENCOUNTER — Ambulatory Visit
Admission: RE | Admit: 2018-07-30 | Discharge: 2018-07-30 | Disposition: A | Discharge status: Home / Self Care | Payer: Medicare HMO | Source: Ambulatory Visit | Attending: Radiation Oncology | Admitting: Radiation Oncology

## 2018-07-30 DIAGNOSIS — C61 Malignant neoplasm of prostate: Secondary | ICD-10-CM | POA: Diagnosis not present

## 2018-07-30 NOTE — Progress Notes (Signed)
Received patient and his son in the clinic following radiation treatment. Patient requesting to see this RN. Patient reports an episode of inability to void coupled with dysuria yesterday around 1500. Also, reports an inability to move his bowels and bloating. Patient reports he took a laxative, had a bowel movement and no more episodes of inability to void or dysuria. Informed Shona Simpson, PA-C of these finding. Phoned patient's home to inform him of next steps. No answer at the patient's home and no option to leave a message. Phoned patient's cell and patient's son's cell and left the following instructions: continue afluzosin at bedtime, do not take motrin to reduce inflammation due to elevated creatinine, and present to ED if unable to void in 8 hours. Explained that it is believed this was a one time episode. Also, encouraged patient to begin taking gas x or simethicone for bloating. Provided my contact number for future questions.

## 2018-07-31 ENCOUNTER — Ambulatory Visit
Admission: RE | Admit: 2018-07-31 | Discharge: 2018-07-31 | Disposition: A | Discharge status: Home / Self Care | Payer: Medicare HMO | Source: Ambulatory Visit | Attending: Radiation Oncology | Admitting: Radiation Oncology

## 2018-07-31 DIAGNOSIS — C61 Malignant neoplasm of prostate: Secondary | ICD-10-CM | POA: Diagnosis not present

## 2018-08-01 ENCOUNTER — Ambulatory Visit
Admission: RE | Admit: 2018-08-01 | Discharge: 2018-08-01 | Disposition: A | Discharge status: Home / Self Care | Payer: Medicare HMO | Source: Ambulatory Visit | Attending: Radiation Oncology | Admitting: Radiation Oncology

## 2018-08-01 DIAGNOSIS — C61 Malignant neoplasm of prostate: Secondary | ICD-10-CM | POA: Diagnosis not present

## 2018-08-02 ENCOUNTER — Ambulatory Visit
Admission: RE | Admit: 2018-08-02 | Discharge: 2018-08-02 | Disposition: A | Discharge status: Home / Self Care | Payer: Medicare HMO | Source: Ambulatory Visit | Attending: Radiation Oncology | Admitting: Radiation Oncology

## 2018-08-02 DIAGNOSIS — C61 Malignant neoplasm of prostate: Secondary | ICD-10-CM | POA: Diagnosis not present

## 2018-08-05 ENCOUNTER — Ambulatory Visit
Admission: RE | Admit: 2018-08-05 | Discharge: 2018-08-05 | Disposition: A | Discharge status: Home / Self Care | Payer: Medicare HMO | Source: Ambulatory Visit | Attending: Radiation Oncology | Admitting: Radiation Oncology

## 2018-08-05 DIAGNOSIS — C61 Malignant neoplasm of prostate: Secondary | ICD-10-CM | POA: Diagnosis not present

## 2018-08-06 ENCOUNTER — Ambulatory Visit
Admission: RE | Admit: 2018-08-06 | Discharge: 2018-08-06 | Disposition: A | Discharge status: Home / Self Care | Payer: Medicare HMO | Source: Ambulatory Visit | Attending: Radiation Oncology | Admitting: Radiation Oncology

## 2018-08-06 DIAGNOSIS — C61 Malignant neoplasm of prostate: Secondary | ICD-10-CM | POA: Diagnosis not present

## 2018-08-07 ENCOUNTER — Ambulatory Visit
Admission: RE | Admit: 2018-08-07 | Discharge: 2018-08-07 | Disposition: A | Discharge status: Home / Self Care | Payer: Medicare HMO | Source: Ambulatory Visit | Attending: Radiation Oncology | Admitting: Radiation Oncology

## 2018-08-07 DIAGNOSIS — C61 Malignant neoplasm of prostate: Secondary | ICD-10-CM | POA: Diagnosis not present

## 2018-08-08 ENCOUNTER — Ambulatory Visit
Admission: RE | Admit: 2018-08-08 | Discharge: 2018-08-08 | Disposition: A | Discharge status: Home / Self Care | Payer: Medicare HMO | Source: Ambulatory Visit | Attending: Radiation Oncology | Admitting: Radiation Oncology

## 2018-08-08 DIAGNOSIS — I10 Essential (primary) hypertension: Secondary | ICD-10-CM | POA: Diagnosis not present

## 2018-08-08 DIAGNOSIS — M48062 Spinal stenosis, lumbar region with neurogenic claudication: Secondary | ICD-10-CM | POA: Diagnosis not present

## 2018-08-08 DIAGNOSIS — E119 Type 2 diabetes mellitus without complications: Secondary | ICD-10-CM | POA: Diagnosis not present

## 2018-08-08 DIAGNOSIS — D472 Monoclonal gammopathy: Secondary | ICD-10-CM | POA: Diagnosis not present

## 2018-08-08 DIAGNOSIS — Z0001 Encounter for general adult medical examination with abnormal findings: Secondary | ICD-10-CM | POA: Diagnosis not present

## 2018-08-08 DIAGNOSIS — C61 Malignant neoplasm of prostate: Secondary | ICD-10-CM | POA: Diagnosis not present

## 2018-08-08 DIAGNOSIS — Z0000 Encounter for general adult medical examination without abnormal findings: Secondary | ICD-10-CM | POA: Diagnosis not present

## 2018-08-08 DIAGNOSIS — Z23 Encounter for immunization: Secondary | ICD-10-CM | POA: Diagnosis not present

## 2018-08-08 DIAGNOSIS — Z6831 Body mass index (BMI) 31.0-31.9, adult: Secondary | ICD-10-CM | POA: Diagnosis not present

## 2018-08-08 DIAGNOSIS — Z1389 Encounter for screening for other disorder: Secondary | ICD-10-CM | POA: Diagnosis not present

## 2018-08-08 DIAGNOSIS — E1165 Type 2 diabetes mellitus with hyperglycemia: Secondary | ICD-10-CM | POA: Diagnosis not present

## 2018-08-09 ENCOUNTER — Ambulatory Visit
Admission: RE | Admit: 2018-08-09 | Discharge: 2018-08-09 | Disposition: A | Discharge status: Home / Self Care | Payer: Medicare HMO | Source: Ambulatory Visit | Attending: Radiation Oncology | Admitting: Radiation Oncology

## 2018-08-09 DIAGNOSIS — C61 Malignant neoplasm of prostate: Secondary | ICD-10-CM | POA: Diagnosis not present

## 2018-08-12 ENCOUNTER — Ambulatory Visit
Admission: RE | Admit: 2018-08-12 | Discharge: 2018-08-12 | Disposition: A | Discharge status: Home / Self Care | Payer: Medicare HMO | Source: Ambulatory Visit | Attending: Radiation Oncology | Admitting: Radiation Oncology

## 2018-08-12 ENCOUNTER — Encounter: Payer: Self-pay | Admitting: Medical Oncology

## 2018-08-12 ENCOUNTER — Telehealth: Payer: Self-pay | Admitting: Radiation Oncology

## 2018-08-12 DIAGNOSIS — N182 Chronic kidney disease, stage 2 (mild): Secondary | ICD-10-CM | POA: Diagnosis not present

## 2018-08-12 DIAGNOSIS — C61 Malignant neoplasm of prostate: Secondary | ICD-10-CM | POA: Diagnosis not present

## 2018-08-12 MED ORDER — TAMSULOSIN HCL 0.4 MG PO CAPS: 0.4000 mg | ORAL_CAPSULE | Freq: Every day | ORAL | 2 refills | Status: DC

## 2018-08-12 NOTE — Progress Notes (Signed)
Received patient in the nursing clinic following radiation today. Reports dysuria. Denies hematuria. Reports his urine "comes out as just a trickle." Patient states, "sometimes I feel like I am going to explode but just a trickle of urine comes out." Reports nocturia x 5. Reported finding to Shona Simpson, PA-C. Patient's prostate volume prior to treatment was 81.8 gram. BPH suspected. Called in Flomax as ordered by Shona Simpson, PA-C. Phoned patient. Explained Flomax has been escribed. Requested patient present to nursing s/p xrt tomorrow to provide a urine sample to rule out infection. Patient verbalized understanding.

## 2018-08-12 NOTE — Progress Notes (Signed)
Left message on prescriber voicemail with new Flomax script as ordered by Shona Simpson, PA-C.

## 2018-08-13 ENCOUNTER — Ambulatory Visit
Admission: RE | Admit: 2018-08-13 | Discharge: 2018-08-13 | Disposition: A | Discharge status: Home / Self Care | Payer: Medicare HMO | Source: Ambulatory Visit | Attending: Radiation Oncology | Admitting: Radiation Oncology

## 2018-08-13 DIAGNOSIS — C61 Malignant neoplasm of prostate: Secondary | ICD-10-CM | POA: Insufficient documentation

## 2018-08-13 DIAGNOSIS — R300 Dysuria: Secondary | ICD-10-CM

## 2018-08-13 LAB — URINALYSIS, COMPLETE (UACMP) WITH MICROSCOPIC
Bilirubin Urine: NEGATIVE
Glucose, UA: NEGATIVE mg/dL
Hgb urine dipstick: NEGATIVE
Ketones, ur: NEGATIVE mg/dL
Leukocytes, UA: NEGATIVE
Nitrite: NEGATIVE
Protein, ur: NEGATIVE mg/dL
Specific Gravity, Urine: 1.02 (ref 1.005–1.030)
pH: 5 (ref 5.0–8.0)

## 2018-08-13 NOTE — Progress Notes (Signed)
Gregory Diaz states he feels like he cannot empty his bladder which causes him pain. He states he had issues with constipation last week but this has resolved. He is asked if there is a medication he can take to help. I will discuss with Dr. Levi Aland and Inocente Salles will contact him with update. He voiced understanding.

## 2018-08-14 ENCOUNTER — Ambulatory Visit
Admission: RE | Admit: 2018-08-14 | Discharge: 2018-08-14 | Disposition: A | Discharge status: Home / Self Care | Payer: Medicare HMO | Source: Ambulatory Visit | Attending: Radiation Oncology | Admitting: Radiation Oncology

## 2018-08-14 DIAGNOSIS — C61 Malignant neoplasm of prostate: Secondary | ICD-10-CM | POA: Diagnosis not present

## 2018-08-14 LAB — URINE CULTURE: Culture: NO GROWTH

## 2018-08-15 ENCOUNTER — Ambulatory Visit
Admission: RE | Admit: 2018-08-15 | Discharge: 2018-08-15 | Disposition: A | Discharge status: Home / Self Care | Payer: Medicare HMO | Source: Ambulatory Visit | Attending: Radiation Oncology | Admitting: Radiation Oncology

## 2018-08-15 DIAGNOSIS — C61 Malignant neoplasm of prostate: Secondary | ICD-10-CM | POA: Diagnosis not present

## 2018-08-16 ENCOUNTER — Ambulatory Visit
Admission: RE | Admit: 2018-08-16 | Discharge: 2018-08-16 | Disposition: A | Discharge status: Home / Self Care | Payer: Medicare HMO | Source: Ambulatory Visit | Attending: Radiation Oncology | Admitting: Radiation Oncology

## 2018-08-16 DIAGNOSIS — C61 Malignant neoplasm of prostate: Secondary | ICD-10-CM | POA: Diagnosis not present

## 2018-08-19 ENCOUNTER — Ambulatory Visit
Admission: RE | Admit: 2018-08-19 | Discharge: 2018-08-19 | Disposition: A | Discharge status: Home / Self Care | Payer: Medicare HMO | Source: Ambulatory Visit | Attending: Radiation Oncology | Admitting: Radiation Oncology

## 2018-08-19 DIAGNOSIS — C61 Malignant neoplasm of prostate: Secondary | ICD-10-CM | POA: Diagnosis not present

## 2018-08-20 ENCOUNTER — Ambulatory Visit
Admission: RE | Admit: 2018-08-20 | Discharge: 2018-08-20 | Disposition: A | Discharge status: Home / Self Care | Payer: Medicare HMO | Source: Ambulatory Visit | Attending: Radiation Oncology | Admitting: Radiation Oncology

## 2018-08-20 DIAGNOSIS — C61 Malignant neoplasm of prostate: Secondary | ICD-10-CM | POA: Diagnosis not present

## 2018-08-21 ENCOUNTER — Ambulatory Visit
Admission: RE | Admit: 2018-08-21 | Discharge: 2018-08-21 | Disposition: A | Discharge status: Home / Self Care | Payer: Medicare HMO | Source: Ambulatory Visit | Attending: Radiation Oncology | Admitting: Radiation Oncology

## 2018-08-21 DIAGNOSIS — C61 Malignant neoplasm of prostate: Secondary | ICD-10-CM | POA: Diagnosis not present

## 2018-08-22 ENCOUNTER — Ambulatory Visit
Admission: RE | Admit: 2018-08-22 | Discharge: 2018-08-22 | Disposition: A | Discharge status: Home / Self Care | Payer: Medicare HMO | Source: Ambulatory Visit | Attending: Radiation Oncology | Admitting: Radiation Oncology

## 2018-08-22 DIAGNOSIS — C61 Malignant neoplasm of prostate: Secondary | ICD-10-CM | POA: Diagnosis not present

## 2018-08-23 ENCOUNTER — Ambulatory Visit
Admission: RE | Admit: 2018-08-23 | Discharge: 2018-08-23 | Disposition: A | Discharge status: Home / Self Care | Payer: Medicare HMO | Source: Ambulatory Visit | Attending: Radiation Oncology | Admitting: Radiation Oncology

## 2018-08-23 DIAGNOSIS — Z720 Tobacco use: Secondary | ICD-10-CM | POA: Diagnosis not present

## 2018-08-23 DIAGNOSIS — I129 Hypertensive chronic kidney disease with stage 1 through stage 4 chronic kidney disease, or unspecified chronic kidney disease: Secondary | ICD-10-CM | POA: Diagnosis not present

## 2018-08-23 DIAGNOSIS — E1122 Type 2 diabetes mellitus with diabetic chronic kidney disease: Secondary | ICD-10-CM | POA: Diagnosis not present

## 2018-08-23 DIAGNOSIS — D631 Anemia in chronic kidney disease: Secondary | ICD-10-CM | POA: Diagnosis not present

## 2018-08-23 DIAGNOSIS — N182 Chronic kidney disease, stage 2 (mild): Secondary | ICD-10-CM | POA: Diagnosis not present

## 2018-08-23 DIAGNOSIS — D472 Monoclonal gammopathy: Secondary | ICD-10-CM | POA: Diagnosis not present

## 2018-08-23 DIAGNOSIS — C61 Malignant neoplasm of prostate: Secondary | ICD-10-CM | POA: Diagnosis not present

## 2018-08-23 DIAGNOSIS — E669 Obesity, unspecified: Secondary | ICD-10-CM | POA: Diagnosis not present

## 2018-08-23 DIAGNOSIS — R809 Proteinuria, unspecified: Secondary | ICD-10-CM | POA: Diagnosis not present

## 2018-08-23 DIAGNOSIS — N041 Nephrotic syndrome with focal and segmental glomerular lesions: Secondary | ICD-10-CM | POA: Diagnosis not present

## 2018-08-26 ENCOUNTER — Ambulatory Visit
Admission: RE | Admit: 2018-08-26 | Discharge: 2018-08-26 | Disposition: A | Discharge status: Home / Self Care | Payer: Medicare HMO | Source: Ambulatory Visit | Attending: Radiation Oncology | Admitting: Radiation Oncology

## 2018-08-26 DIAGNOSIS — C61 Malignant neoplasm of prostate: Secondary | ICD-10-CM | POA: Diagnosis not present

## 2018-08-27 ENCOUNTER — Ambulatory Visit
Admission: RE | Admit: 2018-08-27 | Discharge: 2018-08-27 | Disposition: A | Discharge status: Home / Self Care | Payer: Medicare HMO | Source: Ambulatory Visit | Attending: Radiation Oncology | Admitting: Radiation Oncology

## 2018-08-27 DIAGNOSIS — C61 Malignant neoplasm of prostate: Secondary | ICD-10-CM | POA: Diagnosis not present

## 2018-08-28 ENCOUNTER — Ambulatory Visit
Admission: RE | Admit: 2018-08-28 | Discharge: 2018-08-28 | Disposition: A | Discharge status: Home / Self Care | Payer: Medicare HMO | Source: Ambulatory Visit | Attending: Radiation Oncology | Admitting: Radiation Oncology

## 2018-08-28 DIAGNOSIS — C61 Malignant neoplasm of prostate: Secondary | ICD-10-CM | POA: Diagnosis not present

## 2018-08-29 ENCOUNTER — Encounter: Payer: Self-pay | Admitting: Radiation Oncology

## 2018-08-29 ENCOUNTER — Encounter: Payer: Self-pay | Admitting: Medical Oncology

## 2018-08-29 ENCOUNTER — Ambulatory Visit
Admission: RE | Admit: 2018-08-29 | Discharge: 2018-08-29 | Disposition: A | Discharge status: Home / Self Care | Payer: Medicare HMO | Source: Ambulatory Visit | Attending: Radiation Oncology | Admitting: Radiation Oncology

## 2018-08-29 DIAGNOSIS — C61 Malignant neoplasm of prostate: Secondary | ICD-10-CM | POA: Diagnosis not present

## 2018-09-11 NOTE — Progress Notes (Signed)
  Radiation Oncology         [phone]) [phone] ________________________________  Name: Gregory Faster Mensch Sr. MRN: [id number]  Date: 08/29/2018  DOB: Feb 06, 1951  End of Treatment Note  Diagnosis:   67 y.o. gentleman with Stage T1c adenocarcinoma of the prostate with Gleason Score of 3+4, and PSA of 6.9.     Indication for treatment:  Curative, Definitive Radiotherapy       Radiation treatment dates:   07/23/2018 to 08/29/2018  Site/dose:   The prostate was treated to 70 Gy in 28 fractions of 2.5 Gy  Beams/energy:   The patient was treated with IMRT using volumetric arc therapy delivering 6 MV X-rays to clockwise and counterclockwise circumferential arcs with a 90 degree collimator offset to avoid dose scalloping.  Image guidance was performed with daily cone beam CT prior to each fraction to align to gold markers in the prostate and assure proper bladder and rectal fill volumes.  Immobilization was achieved with Body[employer] custom mold.  Narrative: The patient tolerated radiation treatment relatively well.   He experienced modest fatigue and some minor urinary irritation including dysuria, occasional urgency, and nocturia x6-7 but denied gross hematuria. He experienced occasional constipation  relieved by OTC laxatives. He denied rectal bleeding.    Plan: The patient has completed radiation treatment. He will return to radiation oncology clinic for routine followup in one month. I advised him to call or return sooner if he has any questions or concerns related to his recovery or treatment. ________________________________  Sheral Apley. Tammi Klippel, M.D.  This document serves as a record of services personally performed by Tyler Pita, MD. It was created on his behalf by Arlyce Harman, a trained medical scribe. The creation of this record is based on the scribe's personal observations and the provider's statements to them. This document has been checked and approved by the attending provider.

## 2018-09-24 ENCOUNTER — Encounter: Payer: Self-pay | Admitting: General Surgery

## 2018-09-24 ENCOUNTER — Ambulatory Visit: Payer: Medicare HMO | Admitting: General Surgery

## 2018-09-24 VITALS — BP 165/88 | HR 46 | Temp 98.0°F | Resp 18 | Wt 244.4 lb

## 2018-09-24 DIAGNOSIS — T8130XA Disruption of wound, unspecified, initial encounter: Secondary | ICD-10-CM

## 2018-09-24 DIAGNOSIS — R1033 Periumbilical pain: Secondary | ICD-10-CM

## 2018-09-24 DIAGNOSIS — T8189XA Other complications of procedures, not elsewhere classified, initial encounter: Secondary | ICD-10-CM

## 2018-09-25 ENCOUNTER — Encounter: Payer: Self-pay | Admitting: General Surgery

## 2018-09-25 DIAGNOSIS — T8189XA Other complications of procedures, not elsewhere classified, initial encounter: Secondary | ICD-10-CM

## 2018-09-25 NOTE — Progress Notes (Signed)
[facility] History and Physical   Chief Complaint: suture granuloma / spitting suture at umbilical hernia site   Gregory A Berent Sr. is a 67 y.o. male.  HPI: Gregory Diaz is a very pleasant 67 yo who I had the privilege to meet in August 2019. He came to me with what appears to be a suture granuloma/ spitting suture at this prior umbilical hernia site. He had a history of repair of the umbilical hernia in 1308 and noticed a bulge and clear fluid from the area since 2008. He believed his hernia was primarily repaired and no mesh was used, and he had been able to take care of this area using bactroban as needed when it became swollen or when it drained.  He denied in redness or purulence in the area, and had been living with this for 10+ years. On my exam back in August a suture was visible in his umbilicus, and he was also worried about discomfort and a bulge in the left abdomen.  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   He has since then underwent radiation for his prostate cancer and has tolerated this well. He did have some issues with retention that have improved.  He says he is doing well, and has not had any drainage except for feeling some crusting in the area a few weeks ago.   He returns today with his wife to discuss the options and plan.   Past Medical History:  Diagnosis Date  . Abnormal SPEP 09/09/2015  . Anxiety   . Arthritis    DDD, low back  . Early cataracts, bilateral   . Enlarged prostate   . Heart murmur    was told at age 37, but has not had any problems  . Hypertension   . MGUS (monoclonal gammopathy of unknown significance) 01/29/2016  . Prostate cancer [facility])     Past Surgical History:  Procedure Laterality  Date  . BACK SURGERY  2004, 2015  . COLONOSCOPY    . EYE SURGERY     L eye- as a child   . FRACTURE SURGERY Right    thumb  . HEMORROIDECTOMY    . HERNIA REPAIR     umbilical hernia repair  . MASS EXCISION Right 07/11/2016   Procedure: EXCISION 6CM RIGHT SHOULDER MASS;  Surgeon: Vickie Epley, MD;  Location: [facility];  Service: Vascular;  Laterality: Right;  . PROSTATE BIOPSY    . TONSILLECTOMY      Family History  Problem Relation Age of Onset  . Gallstones Father   . Ulcers Father   . Alcohol abuse Father   . Diabetes Daughter   . Diabetes Brother   . Diabetes Sister   . Breast cancer Sister     Social History   Tobacco Use  . Smoking status: Current Every Day Smoker    Packs/day: 0.50    Years: 50.00    Pack years: 25.00    Types: Cigarettes  . Smokeless tobacco: Never Used  Substance Use Topics  . Alcohol use: No  . Drug use: No    Medications: I have reviewed the patient's current medications. Allergies as of 09/24/2018      Reactions   Penicillins Swelling  Has patient had a PCN reaction causing immediate rash, facial/tongue/throat swelling, SOB or lightheadedness with hypotension: Yes Has patient had a PCN reaction causing severe rash involving mucus membranes or skin necrosis: No Has patient had a PCN reaction that required hospitalization No Has patient had a PCN reaction occurring within the last 10 years: No If all of the above answers are "NO", then may proceed with Cephalosporin use.      Medication List        Accurate as of 09/24/18 11:59 PM. Always use your most recent med list.          gabapentin 300 MG capsule Commonly known as:  NEURONTIN Take by mouth.   oxyCODONE-acetaminophen 10-325 MG tablet Commonly known as:  PERCOCET Take 1 tablet by mouth every 4 (four) hours as needed for pain.   senna 8.6 MG tablet Commonly known as:  SENOKOT Take 1 tablet by mouth as needed for constipation. Will take up to 3 if needed   traZODone  50 MG tablet Commonly known as:  DESYREL   valACYclovir 500 MG tablet Commonly known as:  VALTREX        ROS:  A comprehensive review of systems was negative except for: Gastrointestinal: positive for minor left upper quadrant discomfort, improved; umbilicus with some crusting, no overt drainage  Blood pressure (!) 165/88, pulse (!) 46, temperature 98 F (36.7 C), temperature source Temporal, resp. rate 18, weight 244 lb 6.4 oz (110.9 kg). Physical Exam  Constitutional: He is oriented to person, place, and time. He appears well-developed and well-nourished.  HENT:  Head: Normocephalic.  Eyes: Pupils are equal, round, and reactive to light. EOM are normal.  Neck: Normal range of motion.  Cardiovascular: Normal rate and regular rhythm.  Pulmonary/Chest: Effort normal and breath sounds normal.  Abdominal: Soft. He exhibits no distension. There is no tenderness.  No obvious bulge at the umbilicus but difficult to assess give granuloma, suture deep in the umbilicus and granular tissue, left upper abdominal bulge relatively symmetrical to right, no obvious defect noted   Musculoskeletal: Normal range of motion. He exhibits no edema.  Neurological: He is alert and oriented to person, place, and time.  Skin: Skin is warm and dry.  Psychiatric: He has a normal mood and affect. His behavior is normal. Judgment and thought content normal.  Vitals reviewed.   Results: CT 2016 reviewed- inflammation around umbilicus/ hazy stranding, no hernias noted   Assessment & Plan:  Gregory A Som Sr. is a 67 y.o. male with a spiting suture/ foreign body and granuloma at the umbilical hernia site. He has had chronic drainage of the area and needs to have it revised and the inflammation/ foreign body removed / debridement performed.   -OR for debridement of muscle/ fascia to remove the spit suture/ granuloma that has formed, possible hernia repair  -CT a/p ordered to assess for any hernias not  appreciating and to assess extent of inflammation at this time   All questions were answered to the satisfaction of the patient and family.  The risk and benefits of debridement/ excision muscle/ fascia and suture/ granuloma and possible hernia repair were discussed including but not limited to bleeding, infection, recurrence of hernia, plan for not using any foreign permanent material given inflammation and infection.  After careful consideration, Gregory A Brooke Sr. has decided to proceed.    Virl Cagey 09/25/2018, 1:45 PM

## 2018-09-25 NOTE — H&P (Signed)
[facility] History and Physical   Chief Complaint: suture granuloma / spitting suture at umbilical hernia site   Gregory A Konitzer Sr. is a 67 y.o. male.  HPI: Gregory Diaz is a very pleasant 67 yo who I had the privilege to meet in August 2019. He came to me with what appears to be a suture granuloma/ spitting suture at this prior umbilical hernia site. He had a history of repair of the umbilical hernia in 4196 and noticed a bulge and clear fluid from the area since 2008. He believed his hernia was primarily repaired and no mesh was used, and he had been able to take care of this area using bactroban as needed when it became swollen or when it drained.  He denied in redness or purulence in the area, and had been living with this for 10+ years. On my exam back in August a suture was visible in his umbilicus, and he was also worried about discomfort and a bulge in the left abdomen.  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   He has since then underwent radiation for his prostate cancer and has tolerated this well. He did have some issues with retention that have improved.  He says he is doing well, and has not had any drainage except for feeling some crusting in the area a few weeks ago.   He returns today with his wife to discuss the options and plan.       Past Medical History:  Diagnosis Date  . Abnormal SPEP 09/09/2015  . Anxiety   . Arthritis    DDD, low back  . Early cataracts, bilateral   . Enlarged prostate   . Heart murmur    was told at age 22, but has not had any problems  . Hypertension   . MGUS (monoclonal gammopathy of unknown significance) 01/29/2016  . Prostate cancer [facility])          Past Surgical  History:  Procedure Laterality Date  . BACK SURGERY  2004, 2015  . COLONOSCOPY    . EYE SURGERY     L eye- as a child   . FRACTURE SURGERY Right    thumb  . HEMORROIDECTOMY    . HERNIA REPAIR     umbilical hernia repair  . MASS EXCISION Right 07/11/2016   Procedure: EXCISION 6CM RIGHT SHOULDER MASS;  Surgeon: Vickie Epley, MD;  Location: [facility];  Service: Vascular;  Laterality: Right;  . PROSTATE BIOPSY    . TONSILLECTOMY      Family History  Problem Relation Age of Onset  . Gallstones Father   . Ulcers Father   . Alcohol abuse Father   . Diabetes Daughter   . Diabetes Brother   . Diabetes Sister   . Breast cancer Sister     Social History        Tobacco Use  . Smoking status: Current Every Day Smoker    Packs/day: 0.50    Years: 50.00    Pack years: 25.00    Types: Cigarettes  . Smokeless tobacco: Never Used  Substance Use Topics  . Alcohol use: No  . Drug use: No    Medications: I have reviewed the patient's current medications.  Allergies as of 09/24/2018      Reactions   Penicillins Swelling   Has patient had a PCN reaction causing immediate rash, facial/tongue/throat swelling, SOB or lightheadedness with hypotension: Yes Has patient had a PCN reaction causing severe rash involving mucus membranes or skin necrosis: No Has patient had a PCN reaction that required hospitalization No Has patient had a PCN reaction occurring within the last 10 years: No If all of the above answers are "NO", then may proceed with Cephalosporin use.               Medication List            Accurate as of 09/24/18 11:59 PM. Always use your most recent med list.           gabapentin 300 MG capsule Commonly known as:  NEURONTIN Take by mouth.   oxyCODONE-acetaminophen 10-325 MG tablet Commonly known as:  PERCOCET Take 1 tablet by mouth every 4 (four) hours as needed for pain.   senna 8.6 MG tablet Commonly  known as:  SENOKOT Take 1 tablet by mouth as needed for constipation. Will take up to 3 if needed   traZODone 50 MG tablet Commonly known as:  DESYREL   valACYclovir 500 MG tablet Commonly known as:  VALTREX        ROS:  A comprehensive review of systems was negative except for: Gastrointestinal: positive for minor left upper quadrant discomfort, improved; umbilicus with some crusting, no overt drainage  Blood pressure (!) 165/88, pulse (!) 46, temperature 98 F (36.7 C), temperature source Temporal, resp. rate 18, weight 244 lb 6.4 oz (110.9 kg). Physical Exam  Constitutional: He is oriented to person, place, and time. He appears well-developed and well-nourished.  HENT:  Head: Normocephalic.  Eyes: Pupils are equal, round, and reactive to light. EOM are normal.  Neck: Normal range of motion.  Cardiovascular: Normal rate and regular rhythm.  Pulmonary/Chest: Effort normal and breath sounds normal.  Abdominal: Soft. He exhibits no distension. There is no tenderness.  No obvious bulge at the umbilicus but difficult to assess give granuloma, suture deep in the umbilicus and granular tissue, left upper abdominal bulge relatively symmetrical to right, no obvious defect noted   Musculoskeletal: Normal range of motion. He exhibits no edema.  Neurological: He is alert and oriented to person, place, and time.  Skin: Skin is warm and dry.  Psychiatric: He has a normal mood and affect. His behavior is normal. Judgment and thought content normal.  Vitals reviewed.   Results: CT 2016 reviewed- inflammation around umbilicus/ hazy stranding, no hernias noted   Assessment & Plan:  Gregory A Finau Sr. is a 67 y.o. male with a spiting suture/ foreign body and granuloma at the umbilical hernia site. He has had chronic drainage of the area and needs to have it revised and the inflammation/ foreign body removed / debridement performed.   -OR for debridement of muscle/ fascia to remove  the spit suture/ granuloma that has formed, possible hernia repair  -CT a/p ordered to assess for any hernias not appreciating and to assess extent of inflammation at this time   All questions were answered to the satisfaction of the patient and family.  The risk and benefits of debridement/ excision muscle/ fascia and suture/ granuloma and possible hernia repair were discussed including but not limited to bleeding, infection, recurrence of hernia, plan for not using any foreign permanent material given inflammation and infection.  After careful consideration, Yovanny Coats  Caponigro Sr. has decided to proceed.    Virl Cagey 09/25/2018, 1:45 PM

## 2018-10-01 NOTE — Addendum Note (Signed)
Addended by: Curlene Labrum on: 10/01/2018 11:46 AM   Modules accepted: Orders

## 2018-10-02 ENCOUNTER — Encounter: Payer: Self-pay | Admitting: *Deleted

## 2018-10-02 ENCOUNTER — Encounter: Payer: Self-pay | Admitting: Urology

## 2018-10-02 ENCOUNTER — Ambulatory Visit
Admission: RE | Admit: 2018-10-02 | Discharge: 2018-10-02 | Disposition: A | Discharge status: Home / Self Care | Payer: Medicare HMO | Source: Ambulatory Visit | Attending: Urology | Admitting: Urology

## 2018-10-02 ENCOUNTER — Other Ambulatory Visit: Payer: Self-pay

## 2018-10-02 VITALS — BP 151/105 | HR 44 | Temp 98.2°F | Resp 18 | Ht 74.0 in | Wt 243.0 lb

## 2018-10-02 DIAGNOSIS — C61 Malignant neoplasm of prostate: Secondary | ICD-10-CM | POA: Insufficient documentation

## 2018-10-02 DIAGNOSIS — I10 Essential (primary) hypertension: Secondary | ICD-10-CM | POA: Diagnosis not present

## 2018-10-02 DIAGNOSIS — Z79899 Other long term (current) drug therapy: Secondary | ICD-10-CM | POA: Diagnosis not present

## 2018-10-02 NOTE — Progress Notes (Signed)
Radiation Oncology         [phone]) [phone] ________________________________  Name: Gregory Faster Meek Sr. MRN: [id number]  Date: 10/02/2018  DOB: [birth year]  Post Treatment Note  CC: Sharilyn Sites, MD  Cleon Gustin, MD  Diagnosis:   67 y.o. gentleman with Stage T1c adenocarcinoma of the prostate with Gleason Score of 3+4, and PSA of 6.9.     Interval Since Last Radiation:  4 weeks  07/23/2018 to 08/29/2018:  The prostate was treated to 70 Gy in 28 fractions of 2.5 Gy  Narrative:  The patient returns today for routine follow-up.  He tolerated radiation treatment relatively well.   He experienced modest fatigue and some minor urinary irritation including dysuria, occasional urgency, and nocturia x6-7 but denied gross hematuria. He experienced occasional constipation  relieved by OTC laxatives. He denied rectal bleeding.                                On review of systems, the patient states that he is doing well overall.  His lower urinary tract symptoms are gradually improving and his current IPSS score is 20 indicating moderate to severe lower urinary tract symptoms.  He reports increased urgency, frequency and nocturia x4 per night but in general feels that he is emptying his bladder on voiding.  He does occasionally have to strain due to weak stream but denies dysuria or gross hematuria.  He reports a healthy appetite and is maintaining his weight.  He denies abdominal pain, nausea, vomiting, diarrhea or constipation.  He does continue with moderate fatigue which again he feels is gradually improving. He has noticed intermittent numbness/tingling in the fingers of his hands bilaterally but denies chest pain, shortness of breath or diaphoresis.  ALLERGIES:  is allergic to penicillins.  Meds: Current Outpatient Medications  Medication Sig Dispense Refill  . gabapentin (NEURONTIN) 300 MG capsule Take by mouth.    . oxyCODONE-acetaminophen (PERCOCET) 10-325 MG tablet Take 1 tablet by mouth  every 4 (four) hours as needed for pain.    Marland Kitchen senna (SENOKOT) 8.6 MG tablet Take 1 tablet by mouth as needed for constipation. Will take up to 3 if needed    . traZODone (DESYREL) 50 MG tablet   2  . valACYclovir (VALTREX) 500 MG tablet      No current facility-administered medications for this encounter.    Facility-Administered Medications Ordered in Other Encounters  Medication Dose Route Frequency Provider Last Rate Last Dose  . Chlorhexidine Gluconate Cloth 2 % PADS 6 each  6 each Topical Once Vickie Epley, MD       And  . Chlorhexidine Gluconate Cloth 2 % PADS 6 each  6 each Topical Once Vickie Epley, MD        Physical Findings:  height is 6\' 2"  (1.88 m) and weight is 243 lb (110.2 kg). His oral temperature is 98.2 F (36.8 C). His blood pressure is 151/105 (abnormal) and his pulse is 44 (abnormal). His respiration is 18 and oxygen saturation is 100%.  Pain Assessment Pain Score: 5  Pain Frequency: Other (Comment) Pain Loc: Buttocks/10 In general this is a well appearing African-American male in no acute distress.  He's alert and oriented x4 and appropriate throughout the examination. Cardiopulmonary assessment is negative for acute distress and he exhibits normal effort.  He reports that his blood pressure has continued to run high over the last 3-4 consecutive days.  He has been on blood pressure medication in the past but was taken off of this by his PCP this year.  Lab Findings: Lab Results  Component Value Date   WBC 6.3 12/19/2017   HGB 13.6 12/19/2017   HCT 43.0 12/19/2017   MCV 86.2 12/19/2017   PLT 165 12/19/2017     Radiographic Findings: No results found.  Impression/Plan: 1. 67 y.o. gentleman with Stage T1c adenocarcinoma of the prostate with Gleason Score of 3+4, and PSA of 6.9.   He will continue to follow up with urology for ongoing PSA determinations and has an appointment scheduled with Dr. Alyson Ingles on 10/16/2018. He understands what to expect  with regards to PSA monitoring going forward. I will look forward to following his response to treatment via correspondence with urology, and would be happy to continue to participate in his care if clinically indicated. I talked to the patient about what to expect in the future, including his risk for erectile dysfunction and rectal bleeding. I encouraged him to call or return to the office if he has any questions regarding his previous radiation or possible radiation side effects. He was comfortable with this plan and will follow up as needed. 2. Hypertension with decreased heart rate.  Due to the fact that he is feeling fatigued and experiencing occasional numbness/tingling in the fingers on both hands I have advised that he follow-up with his primary care provider, Dr. Armandina Gemma, in the near future for further evaluation.   I will send a copy of my note over today and the patient agrees to call for an appointment.    Nicholos Johns, PA-C

## 2018-10-08 ENCOUNTER — Other Ambulatory Visit (HOSPITAL_COMMUNITY)
Admission: RE | Admit: 2018-10-08 | Discharge: 2018-10-08 | Disposition: A | Discharge status: Home / Self Care | Payer: Medicare HMO | Source: Ambulatory Visit | Attending: General Surgery | Admitting: General Surgery

## 2018-10-08 DIAGNOSIS — T8189XA Other complications of procedures, not elsewhere classified, initial encounter: Secondary | ICD-10-CM | POA: Insufficient documentation

## 2018-10-08 DIAGNOSIS — E6609 Other obesity due to excess calories: Secondary | ICD-10-CM | POA: Diagnosis not present

## 2018-10-08 DIAGNOSIS — R1033 Periumbilical pain: Secondary | ICD-10-CM | POA: Insufficient documentation

## 2018-10-08 DIAGNOSIS — I10 Essential (primary) hypertension: Secondary | ICD-10-CM | POA: Diagnosis not present

## 2018-10-08 DIAGNOSIS — Z6833 Body mass index (BMI) 33.0-33.9, adult: Secondary | ICD-10-CM | POA: Diagnosis not present

## 2018-10-08 LAB — CBC WITH DIFFERENTIAL/PLATELET
Abs Immature Granulocytes: 0.01 10*3/uL (ref 0.00–0.07)
Basophils Absolute: 0 10*3/uL (ref 0.0–0.1)
Basophils Relative: 0 %
Eosinophils Absolute: 0.1 10*3/uL (ref 0.0–0.5)
Eosinophils Relative: 2 %
HCT: 47.7 % (ref 39.0–52.0)
Hemoglobin: 14.3 g/dL (ref 13.0–17.0)
Immature Granulocytes: 0 %
Lymphocytes Relative: 23 %
Lymphs Abs: 1 10*3/uL (ref 0.7–4.0)
MCH: 27.6 pg (ref 26.0–34.0)
MCHC: 30 g/dL (ref 30.0–36.0)
MCV: 91.9 fL (ref 80.0–100.0)
Monocytes Absolute: 0.6 10*3/uL (ref 0.1–1.0)
Monocytes Relative: 13 %
Neutro Abs: 2.8 10*3/uL (ref 1.7–7.7)
Neutrophils Relative %: 62 %
Platelets: 219 10*3/uL (ref 150–400)
RBC: 5.19 MIL/uL (ref 4.22–5.81)
RDW: 12.6 % (ref 11.5–15.5)
WBC: 4.5 10*3/uL (ref 4.0–10.5)
nRBC: 0 % (ref 0.0–0.2)

## 2018-10-08 LAB — BASIC METABOLIC PANEL
Anion gap: 5 (ref 5–15)
BUN: 16 mg/dL (ref 8–23)
CO2: 26 mmol/L (ref 22–32)
Calcium: 8.7 mg/dL — ABNORMAL LOW (ref 8.9–10.3)
Chloride: 110 mmol/L (ref 98–111)
Creatinine, Ser: 1.26 mg/dL — ABNORMAL HIGH (ref 0.61–1.24)
GFR calc Af Amer: >60 mL/min (ref >60)
GFR calc non Af Amer: 58 mL/min — ABNORMAL LOW (ref >60)
Glucose, Bld: 99 mg/dL (ref 70–99)
Potassium: 4.2 mmol/L (ref 3.5–5.1)
Sodium: 141 mmol/L (ref 135–145)

## 2018-10-14 DIAGNOSIS — C61 Malignant neoplasm of prostate: Secondary | ICD-10-CM | POA: Diagnosis not present

## 2018-10-14 DIAGNOSIS — N182 Chronic kidney disease, stage 2 (mild): Secondary | ICD-10-CM | POA: Diagnosis not present

## 2018-10-15 ENCOUNTER — Other Ambulatory Visit (HOSPITAL_COMMUNITY): Payer: Medicare HMO

## 2018-10-15 ENCOUNTER — Ambulatory Visit (HOSPITAL_COMMUNITY): Payer: Medicare HMO

## 2018-10-16 ENCOUNTER — Other Ambulatory Visit (HOSPITAL_COMMUNITY)
Admission: RE | Admit: 2018-10-16 | Discharge: 2018-10-16 | Disposition: A | Discharge status: Home / Self Care | Payer: Medicare HMO | Source: Ambulatory Visit | Attending: Preventative Medicine | Admitting: Preventative Medicine

## 2018-10-16 ENCOUNTER — Encounter (HOSPITAL_COMMUNITY)
Admission: RE | Admit: 2018-10-16 | Discharge: 2018-10-16 | Disposition: A | Discharge status: Home / Self Care | Payer: Medicare HMO | Source: Ambulatory Visit | Attending: General Surgery | Admitting: General Surgery

## 2018-10-16 ENCOUNTER — Ambulatory Visit: Payer: Medicare HMO | Admitting: Urology

## 2018-10-16 DIAGNOSIS — R351 Nocturia: Secondary | ICD-10-CM

## 2018-10-16 DIAGNOSIS — D509 Iron deficiency anemia, unspecified: Secondary | ICD-10-CM | POA: Insufficient documentation

## 2018-10-16 DIAGNOSIS — E119 Type 2 diabetes mellitus without complications: Secondary | ICD-10-CM | POA: Diagnosis not present

## 2018-10-16 DIAGNOSIS — C61 Malignant neoplasm of prostate: Secondary | ICD-10-CM | POA: Diagnosis not present

## 2018-10-16 LAB — HEMOGLOBIN A1C
Hgb A1c MFr Bld: 5.5 % (ref 4.8–5.6)
Mean Plasma Glucose: 111.15 mg/dL

## 2018-10-16 LAB — HEMOGLOBIN AND HEMATOCRIT, BLOOD
HCT: 43.4 % (ref 39.0–52.0)
Hemoglobin: 13.8 g/dL (ref 13.0–17.0)

## 2018-10-21 ENCOUNTER — Ambulatory Visit (HOSPITAL_COMMUNITY)
Admission: RE | Admit: 2018-10-21 | Discharge: 2018-10-21 | Disposition: A | Discharge status: Home / Self Care | Payer: Medicare HMO | Source: Ambulatory Visit | Attending: General Surgery | Admitting: General Surgery

## 2018-10-21 DIAGNOSIS — T8130XA Disruption of wound, unspecified, initial encounter: Secondary | ICD-10-CM | POA: Diagnosis present

## 2018-10-21 DIAGNOSIS — R1033 Periumbilical pain: Secondary | ICD-10-CM | POA: Insufficient documentation

## 2018-10-21 DIAGNOSIS — T8189XA Other complications of procedures, not elsewhere classified, initial encounter: Secondary | ICD-10-CM | POA: Insufficient documentation

## 2018-10-21 DIAGNOSIS — K8020 Calculus of gallbladder without cholecystitis without obstruction: Secondary | ICD-10-CM | POA: Diagnosis not present

## 2018-10-21 MED ORDER — IOPAMIDOL (ISOVUE-300) INJECTION 61%
100.0000 mL | Freq: Once | INTRAVENOUS | Status: AC | PRN
  Administered 2018-10-21: 100 mL via INTRAVENOUS

## 2018-10-22 ENCOUNTER — Telehealth: Payer: Self-pay | Admitting: General Surgery

## 2018-10-22 ENCOUNTER — Encounter (HOSPITAL_COMMUNITY)
Admission: RE | Admit: 2018-10-22 | Discharge: 2018-10-22 | Disposition: A | Discharge status: Home / Self Care | Payer: Medicare HMO | Source: Ambulatory Visit | Attending: General Surgery | Admitting: General Surgery

## 2018-10-22 NOTE — Telephone Encounter (Signed)
[facility]  Reviewed CT. Inflammation at the umbilicus noted with hazy stranding which is consistent with the suture granuloma. Also noted the thickening of the bladder and rectum which goes along with his radiation for his prostate cancer.   Has with some fatty liver. Will let PCP know.   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 Will get him into Vascular to see and get set up for surveillance.   Plans for the surgery on Firday for debridment of the umbilicus where the suture granuloma is located. No hernia on CT.   Curlene Labrum, MD [facility] [address] [place], [place] [address] [phone] (office)

## 2018-10-24 ENCOUNTER — Encounter (HOSPITAL_COMMUNITY): Payer: Self-pay

## 2018-10-25 ENCOUNTER — Other Ambulatory Visit: Payer: Self-pay

## 2018-10-25 ENCOUNTER — Ambulatory Visit (HOSPITAL_COMMUNITY): Payer: Medicare HMO | Admitting: Anesthesiology

## 2018-10-25 ENCOUNTER — Encounter (HOSPITAL_COMMUNITY): Payer: Self-pay | Admitting: Anesthesiology

## 2018-10-25 ENCOUNTER — Encounter (HOSPITAL_COMMUNITY)
Admission: RE | Disposition: A | Discharge status: Home / Self Care | Payer: Self-pay | Source: Ambulatory Visit | Attending: General Surgery

## 2018-10-25 ENCOUNTER — Ambulatory Visit (HOSPITAL_COMMUNITY)
Admission: RE | Admit: 2018-10-25 | Discharge: 2018-10-25 | Disposition: A | Discharge status: Home / Self Care | Payer: Medicare HMO | Source: Ambulatory Visit | Attending: General Surgery | Admitting: General Surgery

## 2018-10-25 DIAGNOSIS — Z923 Personal history of irradiation: Secondary | ICD-10-CM | POA: Insufficient documentation

## 2018-10-25 DIAGNOSIS — D472 Monoclonal gammopathy: Secondary | ICD-10-CM | POA: Diagnosis not present

## 2018-10-25 DIAGNOSIS — Z8546 Personal history of malignant neoplasm of prostate: Secondary | ICD-10-CM | POA: Diagnosis not present

## 2018-10-25 DIAGNOSIS — M479 Spondylosis, unspecified: Secondary | ICD-10-CM | POA: Diagnosis not present

## 2018-10-25 DIAGNOSIS — N289 Disorder of kidney and ureter, unspecified: Secondary | ICD-10-CM | POA: Diagnosis not present

## 2018-10-25 DIAGNOSIS — I10 Essential (primary) hypertension: Secondary | ICD-10-CM | POA: Diagnosis not present

## 2018-10-25 DIAGNOSIS — Y838 Other surgical procedures as the cause of abnormal reaction of the patient, or of later complication, without mention of misadventure at the time of the procedure: Secondary | ICD-10-CM | POA: Diagnosis not present

## 2018-10-25 DIAGNOSIS — L089 Local infection of the skin and subcutaneous tissue, unspecified: Secondary | ICD-10-CM | POA: Diagnosis not present

## 2018-10-25 DIAGNOSIS — Z880 Allergy status to penicillin: Secondary | ICD-10-CM | POA: Insufficient documentation

## 2018-10-25 DIAGNOSIS — G709 Myoneural disorder, unspecified: Secondary | ICD-10-CM | POA: Insufficient documentation

## 2018-10-25 DIAGNOSIS — L929 Granulomatous disorder of the skin and subcutaneous tissue, unspecified: Secondary | ICD-10-CM | POA: Diagnosis not present

## 2018-10-25 DIAGNOSIS — T8189XS Other complications of procedures, not elsewhere classified, sequela: Secondary | ICD-10-CM | POA: Diagnosis not present

## 2018-10-25 DIAGNOSIS — Z79899 Other long term (current) drug therapy: Secondary | ICD-10-CM | POA: Insufficient documentation

## 2018-10-25 DIAGNOSIS — I509 Heart failure, unspecified: Secondary | ICD-10-CM | POA: Insufficient documentation

## 2018-10-25 DIAGNOSIS — T8189XA Other complications of procedures, not elsewhere classified, initial encounter: Secondary | ICD-10-CM | POA: Diagnosis not present

## 2018-10-25 DIAGNOSIS — F17210 Nicotine dependence, cigarettes, uncomplicated: Secondary | ICD-10-CM | POA: Diagnosis not present

## 2018-10-25 DIAGNOSIS — S31109A Unspecified open wound of abdominal wall, unspecified quadrant without penetration into peritoneal cavity, initial encounter: Secondary | ICD-10-CM | POA: Diagnosis not present

## 2018-10-25 DIAGNOSIS — R69 Illness, unspecified: Secondary | ICD-10-CM | POA: Diagnosis not present

## 2018-10-25 DIAGNOSIS — I110 Hypertensive heart disease with heart failure: Secondary | ICD-10-CM | POA: Diagnosis not present

## 2018-10-25 DIAGNOSIS — L0882 Omphalitis not of newborn: Secondary | ICD-10-CM | POA: Diagnosis not present

## 2018-10-25 HISTORY — PX: WOUND DEBRIDEMENT: SHX247

## 2018-10-25 SURGERY — DEBRIDEMENT, WOUND
Anesthesia: General | Site: Abdomen

## 2018-10-25 MED ORDER — VANCOMYCIN HCL IN DEXTROSE 1-5 GM/200ML-% IV SOLN
1000.0000 mg | INTRAVENOUS | Status: AC
  Administered 2018-10-25: 1000 mg via INTRAVENOUS

## 2018-10-25 MED ORDER — VANCOMYCIN HCL IN DEXTROSE 1-5 GM/200ML-% IV SOLN
INTRAVENOUS | Status: AC
  Filled 2018-10-25: qty 200

## 2018-10-25 MED ORDER — MIDAZOLAM HCL 2 MG/2ML IJ SOLN: 0.5000 mg | Freq: Once | INTRAMUSCULAR | Status: DC | PRN

## 2018-10-25 MED ORDER — ONDANSETRON HCL 4 MG/2ML IJ SOLN
INTRAMUSCULAR | Status: DC | PRN
  Administered 2018-10-25: 4 mg via INTRAVENOUS

## 2018-10-25 MED ORDER — SODIUM CHLORIDE 0.9 % IR SOLN
Status: DC | PRN
  Administered 2018-10-25: 1000 mL

## 2018-10-25 MED ORDER — PROMETHAZINE HCL 25 MG/ML IJ SOLN: 6.2500 mg | INTRAMUSCULAR | Status: DC | PRN

## 2018-10-25 MED ORDER — EPHEDRINE SULFATE 50 MG/ML IJ SOLN
INTRAMUSCULAR | Status: DC | PRN
  Administered 2018-10-25: 10 mg via INTRAVENOUS

## 2018-10-25 MED ORDER — DEXTROSE 5 % IV SOLN
INTRAVENOUS | Status: DC | PRN
  Administered 2018-10-25: 10:00:00 via INTRAVENOUS

## 2018-10-25 MED ORDER — CHLORHEXIDINE GLUCONATE CLOTH 2 % EX PADS: 6.0000 | MEDICATED_PAD | Freq: Once | CUTANEOUS | Status: DC

## 2018-10-25 MED ORDER — BUPIVACAINE HCL (PF) 0.5 % IJ SOLN
INTRAMUSCULAR | Status: AC
  Filled 2018-10-25: qty 30

## 2018-10-25 MED ORDER — OXYCODONE HCL 5 MG PO TABS: 5.0000 mg | ORAL_TABLET | ORAL | 0 refills | Status: AC | PRN

## 2018-10-25 MED ORDER — PROPOFOL 10 MG/ML IV BOLUS
INTRAVENOUS | Status: DC | PRN
  Administered 2018-10-25: 120 mg via INTRAVENOUS

## 2018-10-25 MED ORDER — BUPIVACAINE HCL (PF) 0.5 % IJ SOLN
INTRAMUSCULAR | Status: DC | PRN
  Administered 2018-10-25: 6 mL

## 2018-10-25 MED ORDER — HYDROCODONE-ACETAMINOPHEN 7.5-325 MG PO TABS: 1.0000 | ORAL_TABLET | Freq: Once | ORAL | Status: DC | PRN

## 2018-10-25 MED ORDER — HYDROMORPHONE HCL 1 MG/ML IJ SOLN: 0.2500 mg | INTRAMUSCULAR | Status: DC | PRN

## 2018-10-25 MED ORDER — GLYCOPYRROLATE PF 0.2 MG/ML IJ SOSY
PREFILLED_SYRINGE | INTRAMUSCULAR | Status: DC | PRN
  Administered 2018-10-25: .2 mg via INTRAVENOUS

## 2018-10-25 MED ORDER — LACTATED RINGERS IV SOLN
INTRAVENOUS | Status: DC
  Administered 2018-10-25: 10:00:00 via INTRAVENOUS

## 2018-10-25 MED ORDER — FENTANYL CITRATE (PF) 100 MCG/2ML IJ SOLN
INTRAMUSCULAR | Status: DC | PRN
  Administered 2018-10-25: 50 ug via INTRAVENOUS
  Administered 2018-10-25 (×2): 25 ug via INTRAVENOUS

## 2018-10-25 MED ORDER — DOCUSATE SODIUM 100 MG PO CAPS: 100.0000 mg | ORAL_CAPSULE | Freq: Two times a day (BID) | ORAL | 2 refills | Status: AC

## 2018-10-25 SURGICAL SUPPLY — 30 items
BENZOIN TINCTURE PRP APPL 2/3 (GAUZE/BANDAGES/DRESSINGS) ×2 IMPLANT
CLOTH BEACON ORANGE TIMEOUT ST (SAFETY) ×2 IMPLANT
COVER LIGHT HANDLE STERIS (MISCELLANEOUS) ×4 IMPLANT
COVER WAND RF STERILE (DRAPES) ×2 IMPLANT
DRSG TEGADERM 4X4.75 (GAUZE/BANDAGES/DRESSINGS) ×2 IMPLANT
ELECT REM PT RETURN 9FT ADLT (ELECTROSURGICAL) ×2
ELECTRODE REM PT RTRN 9FT ADLT (ELECTROSURGICAL) ×1 IMPLANT
GAUZE SPONGE 4X4 12PLY STRL (GAUZE/BANDAGES/DRESSINGS) ×2 IMPLANT
GLOVE BIO SURGEON STRL SZ 6.5 (GLOVE) ×2 IMPLANT
GLOVE BIO SURGEON STRL SZ7 (GLOVE) ×2 IMPLANT
GLOVE BIOGEL PI IND STRL 6.5 (GLOVE) ×1 IMPLANT
GLOVE BIOGEL PI IND STRL 7.0 (GLOVE) ×2 IMPLANT
GLOVE BIOGEL PI INDICATOR 6.5 (GLOVE) ×1
GLOVE BIOGEL PI INDICATOR 7.0 (GLOVE) ×2
GOWN STRL REUS W/TWL LRG LVL3 (GOWN DISPOSABLE) ×4 IMPLANT
KIT TURNOVER KIT A (KITS) ×2 IMPLANT
MANIFOLD NEPTUNE II (INSTRUMENTS) ×2 IMPLANT
NEEDLE HYPO 25X1 1.5 SAFETY (NEEDLE) ×2 IMPLANT
NS IRRIG 1000ML POUR BTL (IV SOLUTION) ×2 IMPLANT
PACK MINOR (CUSTOM PROCEDURE TRAY) ×2 IMPLANT
PAD ARMBOARD 7.5X6 YLW CONV (MISCELLANEOUS) ×2 IMPLANT
SET BASIN LINEN APH (SET/KITS/TRAYS/PACK) ×2 IMPLANT
SPONGE GAUZE 2X2 8PLY STRL LF (GAUZE/BANDAGES/DRESSINGS) ×2 IMPLANT
SPONGE LAP 18X36 RFD (DISPOSABLE) ×2 IMPLANT
STRIP CLOSURE SKIN 1/4X3 (GAUZE/BANDAGES/DRESSINGS) ×2 IMPLANT
SUT MNCRL AB 4-0 PS2 18 (SUTURE) ×2 IMPLANT
SUT VIC AB 3-0 SH 27 (SUTURE) ×1
SUT VIC AB 3-0 SH 27X BRD (SUTURE) ×1 IMPLANT
SUT VICRYL 0 UR6 27IN ABS (SUTURE) ×2 IMPLANT
SYR CONTROL 10ML LL (SYRINGE) ×2 IMPLANT

## 2018-10-25 NOTE — Anesthesia Postprocedure Evaluation (Signed)
Anesthesia Post Note  Patient: Gregory A Padget Sr.  Procedure(s) Performed: DEBRIDEMENT OF MUSCLE AND FASCIA AT UMBILICUS (N/A Abdomen)  Patient location during evaluation: PACU Anesthesia Type: General Level of consciousness: awake and alert and oriented Pain management: pain level controlled Vital Signs Assessment: post-procedure vital signs reviewed and stable Respiratory status: spontaneous breathing Cardiovascular status: stable Postop Assessment: no apparent nausea or vomiting Anesthetic complications: no     Last Vitals:  Vitals:   10/25/18 0921 10/25/18 1115  BP: (!) 155/86 (!) 163/95  Pulse: (!) 44   Resp:  15  Temp: 37.3 C 36.5 C  SpO2: 96% 98%    Last Pain:  Vitals:   10/25/18 1115  TempSrc:   PainSc: 0-No pain                 ADAMS, AMY A

## 2018-10-25 NOTE — Op Note (Signed)
[facility] Operative Note  10/25/18  Preoperative Diagnosis:  Suture granuloma, chronically inflamed, draining umbilicus wound    Postoperative Diagnosis: Same   Procedure(s) Performed: Excisional sharp debridement of the umbilicus and umbilical stalk with removal of multiple permanent sutures (prolene) and inflamed tissue including skin, subcutaneous tissue and superficial portion of fascia (total area 4 sq cm)    Surgeon: Lanell Matar. Constance Haw, MD   Assistants: No qualified resident was available    Anesthesia: General endotracheal   Anesthesiologist: Lenice Llamas, MD    Specimens:  Umbilicus/ suture granuloma    Estimated Blood Loss: Minimal   Blood Replacement: None    Complications: None   Wound Class: Contaminated (chronically inflamed)    Operative Indications: Gregory Diaz is a 67 yo who had a umbilical hernia repair with primary closure in 2005 with Dr. Tamala Julian. Since about 2008 he has had chronically draining clear fluid from the area and episode of swelling and pain in the area. He has been dealing with this for some time, and on examination in my office, I noted an exposed permanent suture and suture granuloma causing this issue. A CT of the a/p was obtained to verify that no recurrent hernia was seen, and I noted inflammation in the area of the umbilical repair.  After a discussion of the risk and benefits of the procedure, the patient and his family opted to proceed with debridement of the area and removal of the permanent foreign bodies to aid in healing of the tissue given the chronic inflammation and likely chronic subclinical infection.    Findings: Multiple prolene sutures in the umbilical area, one perforating the skin at the umbilicus with associated suture granuloma; area of verrucous like lesion at the umbilicus too    Procedure: The patient was taken to the operating room and placed supine. General endotracheal anesthesia was induced.  Intravenous antibiotics were administered per protocol.  An orogastric tube positioned to decompress the stomach. The abdomen was prepared and draped in the usual sterile fashion.   The umbilicus was noted to have a prolene suture deep centrally. An incision was made around the umbilicus and carried down through the subcutaneous tissue with electrocautery.  Once the fascia was encountered multiple prolene sutures were noted, and these were cut and removed.  The inflamed and hardened tissue around the umbilical stalk area was dissected out, including portions of prolene suture.  The umbilicus was inverted and multiple granuloma type areas were noted with clear drainage. There was also an area concerning for more of a verrucous lesion. Given the chronicity of the wound and the inflammation of the umbilical skin, the skin was excised in continuity with the umbilical stalk and surround inflammation.  The total area was approximately 4 sq cm. The wound was irrigated. Final inspection revealed acceptable hemostasis. The fascia was palpated and no defect was noted but there was a thinner area. 0 Vicryl was used superficially through the fascia to reapproximate more thick fascia over this area. The subcutaneous tissue around the excised umbilicus was undermined and was tacked down over the central portion of the cavity to recreate an umbilicus.  The dermal layer of skin was closed with 3-0 Vicryl and a running 4-0 Monocryl subcuticular was performed. The skin was covered with steristrips. A 2X2 was folded and placed in the new umbilicus and a tegaderm was placed to cover the area.     All counts were correct at the end of the case. The patient was  awakened from anesthesia and extubate without complication.  The patient went to the PACU in stable condition.    Curlene Labrum, MD [facility] [address] [place], [place] [address] [phone] (office)

## 2018-10-25 NOTE — Anesthesia Procedure Notes (Signed)
Procedure Name: LMA Insertion Date/Time: 10/25/2018 10:12 AM Performed by: Andree Elk, Amy A, CRNA Pre-anesthesia Checklist: Patient identified, Emergency Drugs available, Patient being monitored, Suction available and Timeout performed Patient Re-evaluated:Patient Re-evaluated prior to induction Oxygen Delivery Method: Circle system utilized Preoxygenation: Pre-oxygenation with 100% oxygen Induction Type: IV induction Ventilation: Mask ventilation without difficulty LMA: LMA inserted LMA Size: 5.0 Number of attempts: 1 Placement Confirmation: positive ETCO2 and breath sounds checked- equal and bilateral Tube secured with: Tape Dental Injury: Teeth and Oropharynx as per pre-operative assessment

## 2018-10-25 NOTE — Transfer of Care (Signed)
Immediate Anesthesia Transfer of Care Note  Patient: Gregory A Buckingham Sr.  Procedure(s) Performed: DEBRIDEMENT OF MUSCLE AND FASCIA AT UMBILICUS (N/A Abdomen)  Patient Location: PACU  Anesthesia Type:General  Level of Consciousness: awake, alert , oriented and patient cooperative  Airway & Oxygen Therapy: Patient Spontanous Breathing  Post-op Assessment: Report given to RN and Post -op Vital signs reviewed and stable  Post vital signs: Reviewed and stable  Last Vitals:  Vitals Value Taken Time  BP 163/95 10/25/2018 11:15 AM  Temp    Pulse 96 10/25/2018 11:18 AM  Resp 19 10/25/2018 11:18 AM  SpO2 98 % 10/25/2018 11:18 AM  Vitals shown include unvalidated device data.  Last Pain:  Vitals:   10/25/18 0921  TempSrc: Oral  PainSc: 3          Complications: No apparent anesthesia complications

## 2018-10-25 NOTE — Anesthesia Preprocedure Evaluation (Signed)
Anesthesia Evaluation  Patient identified by MRN, date of birth, ID band Patient awake    Reviewed: Allergy & Precautions, NPO status , Patient's Chart, lab work & pertinent test results  Airway Mallampati: II  TM Distance: >3 FB Neck ROM: Full    Dental no notable dental hx. (+) Teeth Intact, Missing   Pulmonary neg pulmonary ROS, Current Smoker,    Pulmonary exam normal breath sounds clear to auscultation       Cardiovascular Exercise Tolerance: Poor hypertension, Pt. on medications +CHF  Normal cardiovascular examII+ Valvular Problems/Murmurs  Rhythm:Regular Rate:Normal  Has had murmer for years  HR in the low 40s -states feels well today  Took pain med last hs D/w pt/family getting cardiology eval prior to elective case  Pt with HR in the 40s in preop visit  WTP with low risk surg  In the face of decreased HR Denies ischemic symptoms    Neuro/Psych Anxiety  Neuromuscular disease negative psych ROS   GI/Hepatic negative GI ROS, Neg liver ROS,   Endo/Other  negative endocrine ROS  Renal/GU Renal disease  negative genitourinary   Musculoskeletal  (+) Arthritis , Osteoarthritis,    Abdominal   Peds negative pediatric ROS (+)  Hematology negative hematology ROS (+)   Anesthesia Other Findings   Reproductive/Obstetrics negative OB ROS                             Anesthesia Physical Anesthesia Plan  ASA: III  Anesthesia Plan: General   Post-op Pain Management:    Induction: Intravenous  PONV Risk Score and Plan:   Airway Management Planned: LMA  Additional Equipment:   Intra-op Plan:   Post-operative Plan:   Informed Consent: I have reviewed the patients History and Physical, chart, labs and discussed the procedure including the risks, benefits and alternatives for the proposed anesthesia with the patient or authorized representative who has indicated his/her understanding  and acceptance.   Dental advisory given  Plan Discussed with: CRNA  Anesthesia Plan Comments: (LMA vs ETT if Muscle relaxation needed )        Anesthesia Quick Evaluation

## 2018-10-25 NOTE — Addendum Note (Signed)
Addendum  created 10/25/18 1434 by Jonna Munro, CRNA   Charge Capture section accepted, Visit diagnoses modified

## 2018-10-25 NOTE — Interval H&P Note (Signed)
History and Physical Interval Note:  10/25/2018 9:59 AM  Gregory Moores A Carillo Sr.  has presented today for surgery, with the diagnosis of suture granuloma; infection and inflammation of internal prosthesis (suture)  The various methods of treatment have been discussed with the patient and family. After consideration of risks, benefits and other options for treatment, the patient has consented to  Procedure(s): DEBRIDEMENT OF MUSCLE AND FASCIA AT UMBILICUS (N/A) as a surgical intervention .  The patient's history has been reviewed, patient examined, no change in status, stable for surgery.  I have reviewed the patient's chart and labs.  Questions were answered to the patient's satisfaction.    No questions. Noted HR 40s. Not symptomatic.   Virl Cagey

## 2018-10-25 NOTE — Progress Notes (Signed)
[facility]  Patient with percocet prescription on PMP aware. Filled 10/20 for 20 day supply. Have told patient to not mix percocet and roxicodone if he has any percocet remaining.  Curlene Labrum, MD [facility] [address] [place], [place] [address] [phone] (office)

## 2018-10-25 NOTE — Discharge Instructions (Signed)
Discharge Open Abdominal Surgery Instructions:  Common Complaints: Pain at the incision site is common. This will improve with time. Take your pain medications as described below. Some nausea is common and poor appetite. The main goal is to stay hydrated the first few days after surgery.    Diet/ Activity: Diet as tolerated. Keep a dry dressing in place for the next 48 hours. On Sunday you can remove the dressing.  You may shower prior to Sunday with the dressing in place.  Once you remove the dressing, place a small gauze into the belly button area to help apply pressure to the skin for healing.  Once you have changed the dressing you can shower without a dressing in place.  Do not take hot showers. Take warm showers that are less than 10 minutes. Path the incision dry. The strips will fall off in 5-7 days. If they come off sooner, this is ok.  Rest and listen to your body.  Walk everyday for at least 15-20 minutes. Deep cough and move around every 1-2 hours in the first few days after surgery.  Do not lift > 10 lbs, perform excessive bending, pushing, pulling, squatting until seen by Dr. Constance Haw.  Do not place lotions or balms on your incision.   Medication: You already have a Prescription for Percocet. This was last filled 10/19.  DO NOT mix the percocet with the Roxicodone.   Take tylenol and ibuprofen as needed for pain control, alternating every 4-6 hours.  Example:  Tylenol 1000mg  @ 6am, 12noon, 6pm, 21midnight (Do not exceed 4000mg  of tylenol a day). Ibuprofen 800mg  @ 9am, 3pm, 9pm, 3am (Do not exceed 3600mg  of ibuprofen a day).  Take Roxicodone for breakthrough pain every 4 hours.  Take Colace for constipation related to narcotic pain medication. If you do not have a bowel movement in 2 days, take Miralax over the counter.  Drink plenty of water to also prevent constipation.   Contact Information: If you have questions or concerns, please call our office, [phone], Monday-  Thursday 8AM-5PM and Friday 8AM-12Noon.  If it is after hours or on the weekend, please call [facility], [phone], and ask to speak to the surgeon on call for Dr. Constance Haw at [facility].      General Anesthesia, Adult, Care After These instructions provide you with information about caring for yourself after your procedure. Your health care provider may also give you more specific instructions. Your treatment has been planned according to current medical practices, but problems sometimes occur. Call your health care provider if you have any problems or questions after your procedure. What can I expect after the procedure? After the procedure, it is common to have:  Vomiting.  A sore throat.  Mental slowness.  It is common to feel:  Nauseous.  Cold or shivery.  Sleepy.  Tired.  Sore or achy, even in parts of your body where you did not have surgery.  Follow these instructions at home: For at least 24 hours after the procedure:  Do not: ? Participate in activities where you could fall or become injured. ? Drive. ? Use heavy machinery. ? Drink alcohol. ? Take sleeping pills or medicines that cause drowsiness. ? Make important decisions or sign legal documents. ? Take care of children on your own.  Rest. Eating and drinking  If you vomit, drink water, juice, or soup when you can drink without vomiting.  Drink enough fluid to keep your urine clear or pale yellow.  Make sure you have little or no nausea before eating solid foods.  Follow the diet recommended by your health care provider. General instructions  Have a responsible adult stay with you until you are awake and alert.  Return to your normal activities as told by your health care provider. Ask your health care provider what activities are safe for you.  Take over-the-counter and prescription medicines only as told by your health care provider.  If you smoke, do not smoke without  supervision.  Keep all follow-up visits as told by your health care provider. This is important. Contact a health care provider if:  You continue to have nausea or vomiting at home, and medicines are not helpful.  You cannot drink fluids or start eating again.  You cannot urinate after 8-12 hours.  You develop a skin rash.  You have fever.  You have increasing redness at the site of your procedure. Get help right away if:  You have difficulty breathing.  You have chest pain.  You have unexpected bleeding.  You feel that you are having a life-threatening or urgent problem. This information is not intended to replace advice given to you by your health care provider. Make sure you discuss any questions you have with your health care provider. Document Released: 02/05/2001 Document Revised: 04/03/2016 Document Reviewed: 10/14/2015 Elsevier Interactive Patient Education  [employer].

## 2018-10-28 ENCOUNTER — Encounter (HOSPITAL_COMMUNITY): Payer: Self-pay | Admitting: General Surgery

## 2018-10-31 ENCOUNTER — Ambulatory Visit (HOSPITAL_COMMUNITY): Payer: Medicare HMO

## 2018-11-01 ENCOUNTER — Telehealth: Payer: Self-pay | Admitting: Emergency Medicine

## 2018-11-01 NOTE — Telephone Encounter (Signed)
Called patient and notified him of pathology results, notified him that everything looked good. He stated he has been constapatied and has not been able to have a bowel movement

## 2018-11-05 ENCOUNTER — Ambulatory Visit (INDEPENDENT_AMBULATORY_CARE_PROVIDER_SITE_OTHER): Payer: Self-pay | Admitting: General Surgery

## 2018-11-05 ENCOUNTER — Encounter: Payer: Self-pay | Admitting: General Surgery

## 2018-11-05 VITALS — BP 152/88 | HR 45 | Temp 98.7°F | Resp 20 | Wt 233.0 lb

## 2018-11-05 DIAGNOSIS — T8189XS Other complications of procedures, not elsewhere classified, sequela: Secondary | ICD-10-CM

## 2018-11-05 DIAGNOSIS — R1033 Periumbilical pain: Secondary | ICD-10-CM

## 2018-11-05 DIAGNOSIS — T8189XA Other complications of procedures, not elsewhere classified, initial encounter: Secondary | ICD-10-CM

## 2018-11-05 NOTE — Progress Notes (Signed)
[facility] Note   HPI:  67 y.o. Male presents to clinic for post-op follow-up evaluation after excision of suture granuloma from the umbilicus. Patient reports he is doing well. Pain improving, some soreness. No drainage.  Review of Systems:  Pain improving No fever or chills All other review of systems: otherwise negative   Pathology: Diagnosis Umbilicus, biopsy, suture granuloma - SKIN AND SUBCUTANEOUS WITH CHRONIC INFLAMMATION Vital Signs:  BP (!) 152/88 (BP Location: Left Arm, Patient Position: Sitting, Cuff Size: Large)   Pulse (!) 45   Temp 98.7 F (37.1 C) (Temporal)   Resp 20   Wt 233 lb (105.7 kg)   BMI 29.92 kg/m    Physical Exam:  Physical Exam Vitals signs reviewed.  Constitutional:      Appearance: Normal appearance.  Cardiovascular:     Rate and Rhythm: Normal rate.  Pulmonary:     Effort: Pulmonary effort is normal.  Abdominal:     General: There is no distension.     Palpations: Abdomen is soft.     Tenderness: There is no abdominal tenderness.     Hernia: No hernia is present.     Comments: Healing umbilical incision, steri strip removed, no erythema or drainage  Neurological:     Mental Status: He is alert.    Assessment:  67 y.o. yo Male s/p excision of skin/ debridement of umbilical suture granuloma. Doing well.  Plan:  - Follow up PRN - Activity as tolerated   All of the above recommendations were discussed with the patient and patient's family, and all of patient's and family's questions were answered to their expressed satisfaction.  Curlene Labrum, MD [facility] [address] [place], [place] [address] [phone] (office)

## 2018-11-05 NOTE — Patient Instructions (Signed)
Activity and diet as tolerated.    

## 2018-11-18 ENCOUNTER — Encounter: Payer: Self-pay | Admitting: Vascular Surgery

## 2018-11-18 ENCOUNTER — Ambulatory Visit: Payer: Medicare HMO | Admitting: Vascular Surgery

## 2018-11-18 VITALS — BP 164/95 | HR 52 | Temp 98.0°F | Resp 20 | Ht 74.0 in | Wt 235.0 lb

## 2018-11-18 DIAGNOSIS — I723 Aneurysm of iliac artery: Secondary | ICD-10-CM | POA: Diagnosis not present

## 2018-11-18 NOTE — Progress Notes (Signed)
Vascular and Vein Specialist of   Patient name: Gregory Gulbranson Varricchio Sr. MRN: [id number] DOB: 27-Mar-1951 Sex: male  REASON FOR CONSULT: Evaluation of bilateral iliac artery aneurysm.  Seen today in our [place] office  HPI: Gregory Benyo Depaolis Sr. is a 68 y.o. male, who is seen today for evaluation of bilateral iliac artery aneurysm.  He is here today with his wife.  He had a recent diagnosis of prostate cancer and also had 3 for what sounds like a retained suture from hernia repair in the distant past.  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  He reports his main disability is in his left leg.  He reports that he has had 5 prior back surgeries without relief.  He does report an aching sensation that occurs from his hip all the way down into his foot.  This occurs with and without activity.  No history of lower extremity tissue loss  Has reported an episode last week when he rose quickly and actually fell and scraped his right knee.  EMS was called and I have a strip from that event.  His heart rate was 47 and he did have first-degree AV block  Past Medical History:  Diagnosis Date  . Abnormal SPEP 09/09/2015  . Anxiety   . Arthritis    DDD, low back  . Mariusz Jubb cataracts, bilateral   . Enlarged prostate   . Heart murmur    was told at age 29, but has not had any problems  . Hypertension   . MGUS (monoclonal gammopathy of unknown significance) 01/29/2016  . Prostate cancer [facility])     Family History  Problem Relation Age of Onset  . Gallstones Father   . Ulcers Father   . Alcohol abuse Father   . Diabetes Daughter   . Diabetes Brother   . Diabetes Sister   . Breast cancer Sister     SOCIAL HISTORY: Social History   Socioeconomic History  . Marital status: Married    Spouse name: Not on file  . Number of children: Not on file  . Years of education: Not on file  .  Highest education level: Not on file  Occupational History  . Occupation: retire    Comment: 2014; truck [employer] operational officer  . Financial resource strain: Not on file  . Food insecurity:    Worry: Not on file    Inability: Not on file  . Transportation needs:    Medical: Not on file    Non-medical: Not on file  Tobacco Use  . Smoking status: Current Every Day Smoker    Packs/day: 0.50    Years: 50.00    Pack years: 25.00    Types: Cigarettes  . Smokeless tobacco: Never Used  Substance and Sexual Activity  . Alcohol use: No  . Drug use: No  . Sexual activity: Yes    Birth control/protection: None  Lifestyle  . Physical activity:    Days per week: Not on file    Minutes per session: Not on file  . Stress: Not on file  Relationships  . Social connections:    Talks on phone: Not on file    Gets together: Not on file    Attends religious service: Not on file    Active member of club or organization: Not on file    Attends meetings of clubs or organizations: Not on file  Relationship status: Not on file  . Intimate partner violence:    Fear of current or ex partner: Not on file    Emotionally abused: Not on file    Physically abused: Not on file    Forced sexual activity: Not on file  Other Topics Concern  . Not on file  Social History Narrative  . Not on file    Allergies  Allergen Reactions  . Penicillins Swelling and Other (See Comments)    Has patient had a PCN reaction causing immediate rash, facial/tongue/throat swelling, SOB or lightheadedness with hypotension: Yes Has patient had a PCN reaction causing severe rash involving mucus membranes or skin necrosis: No Has patient had a PCN reaction that required hospitalization No Has patient had a PCN reaction occurring within the last 10 years: No If all of the above answers are "NO", then may proceed with Cephalosporin use.     Current Outpatient Medications  Medication Sig Dispense Refill  . docusate  sodium (COLACE) 100 MG capsule Take 1 capsule (100 mg total) by mouth 2 (two) times daily. 60 capsule 2  . gabapentin (NEURONTIN) 300 MG capsule Take 300 mg by mouth daily as needed (pain/neuropathy).     . olmesartan (BENICAR) 40 MG tablet Take 40 mg by mouth daily.    Marland Kitchen oxyCODONE (ROXICODONE) 5 MG immediate release tablet Take 1 tablet (5 mg total) by mouth every 4 (four) hours as needed for severe pain or breakthrough pain. 20 tablet 0  . senna (SENOKOT) 8.6 MG tablet Take 1-3 tablets by mouth daily as needed for constipation. Will take up to 3 if needed     . traZODone (DESYREL) 50 MG tablet Take 50 mg by mouth at bedtime as needed for sleep.   2  . valACYclovir (VALTREX) 500 MG tablet Take 500 mg by mouth daily as needed (for fever blister/cold sores.).      No current facility-administered medications for this visit.    Facility-Administered Medications Ordered in Other Visits  Medication Dose Route Frequency Provider Last Rate Last Dose  . Chlorhexidine Gluconate Cloth 2 % PADS 6 each  6 each Topical Once Vickie Epley, MD       And  . Chlorhexidine Gluconate Cloth 2 % PADS 6 each  6 each Topical Once Vickie Epley, MD        REVIEW OF SYSTEMS:  [X]  denotes positive finding, [ ]  denotes negative finding Cardiac  Comments:  Chest pain or chest pressure:    Shortness of breath upon exertion:    Short of breath when lying flat:    Irregular heart rhythm: x       Vascular    Pain in calf, thigh, or hip brought on by ambulation: x   Pain in feet at night that wakes you up from your sleep:  x   Blood clot in your veins:    Leg swelling:         Pulmonary    Oxygen at home:    Productive cough:     Wheezing:         Neurologic    Sudden weakness in arms or legs:  x   Sudden numbness in arms or legs:  x   Sudden onset of difficulty speaking or slurred speech:    Temporary loss of vision in one eye:     Problems with dizziness:  x       Gastrointestinal    Blood in  stool:  Vomited blood:         Genitourinary    Burning when urinating:  x   Blood in urine:        Psychiatric    Major depression:         Hematologic    Bleeding problems:    Problems with blood clotting too easily:        Skin    Rashes or ulcers:        Constitutional    Fever or chills:      PHYSICAL EXAM: Vitals:   11/18/18 1402  BP: (!) 164/95  Pulse: (!) 52  Resp: 20  Temp: 98 F (36.7 C)  TempSrc: Temporal  Weight: 235 lb (106.6 kg)  Height: 6\' 2"  (1.88 m)    GENERAL: The patient is a well-nourished male, in no acute distress. The vital signs are documented above. CARDIOVASCULAR: Carotids are without bruits bilaterally.  2+ radial, 2+ femoral, 2+ popliteal and 2+ dorsalis pedis pulses bilaterally.  I do not feel any evidence of peripheral aneurysm. PULMONARY: There is good air exchange  ABDOMEN: Soft and non-tender with moderate obesity.  I do not palpate an aneurysm MUSCULOSKELETAL: There are no major deformities or cyanosis. NEUROLOGIC: No focal weakness or paresthesias are detected. SKIN: There are no ulcers or rashes noted. PSYCHIATRIC: The patient has a normal affect.  DATA:  Reviewed his CT scan from 10/21/2018.  Does have ectasia of his common iliac arteries with a maximal diameter of 2 cm bilaterally.  Also compared this with prior CT scan from 2016 and this finding was present thin with no change in this 3 to 4-year interval.  MEDICAL ISSUES: Had a long discussion with the patient and his wife present.  I do not feel that he has any evidence of arterial insufficiency causing any of his lower extremity discomfort.  This does not appear to be neurogenic from his spine.  I explained that he does have enlargement in his iliac arteries but this appears to be more arteriomegaly diffusely than any specific aneurysm.  It is been documented to be stable by CT scan for almost 4 years.  I would not recommend any ongoing follow-up of this.  Will discuss his  syncopal episode with Dr. Hilma Favors determine what further evaluation is indicated.   Rosetta Posner, MD FACS [facility] Tel [phone] Pager [phone]

## 2018-12-09 DIAGNOSIS — M461 Sacroiliitis, not elsewhere classified: Secondary | ICD-10-CM | POA: Diagnosis not present

## 2018-12-09 DIAGNOSIS — M4716 Other spondylosis with myelopathy, lumbar region: Secondary | ICD-10-CM | POA: Diagnosis not present

## 2018-12-09 DIAGNOSIS — M47816 Spondylosis without myelopathy or radiculopathy, lumbar region: Secondary | ICD-10-CM | POA: Diagnosis not present

## 2018-12-10 ENCOUNTER — Other Ambulatory Visit: Payer: Self-pay | Admitting: Neurosurgery

## 2018-12-10 DIAGNOSIS — M461 Sacroiliitis, not elsewhere classified: Secondary | ICD-10-CM

## 2018-12-16 ENCOUNTER — Ambulatory Visit
Admission: RE | Admit: 2018-12-16 | Discharge: 2018-12-16 | Disposition: A | Discharge status: Home / Self Care | Payer: Medicare HMO | Source: Ambulatory Visit | Attending: Neurosurgery | Admitting: Neurosurgery

## 2018-12-16 DIAGNOSIS — M533 Sacrococcygeal disorders, not elsewhere classified: Secondary | ICD-10-CM | POA: Diagnosis not present

## 2018-12-16 DIAGNOSIS — M461 Sacroiliitis, not elsewhere classified: Secondary | ICD-10-CM

## 2018-12-16 MED ORDER — METHYLPREDNISOLONE ACETATE 40 MG/ML INJ SUSP (RADIOLOG
120.0000 mg | Freq: Once | INTRAMUSCULAR | Status: AC
  Administered 2018-12-16: 120 mg via INTRA_ARTICULAR

## 2018-12-16 MED ORDER — IOPAMIDOL (ISOVUE-M 200) INJECTION 41%
1.0000 mL | Freq: Once | INTRAMUSCULAR | Status: AC
  Administered 2018-12-16: 1 mL via INTRA_ARTICULAR

## 2018-12-16 NOTE — Discharge Instructions (Signed)

## 2018-12-26 DIAGNOSIS — C61 Malignant neoplasm of prostate: Secondary | ICD-10-CM | POA: Diagnosis not present

## 2018-12-26 DIAGNOSIS — N182 Chronic kidney disease, stage 2 (mild): Secondary | ICD-10-CM | POA: Diagnosis not present

## 2018-12-30 DIAGNOSIS — Z1389 Encounter for screening for other disorder: Secondary | ICD-10-CM | POA: Diagnosis not present

## 2018-12-30 DIAGNOSIS — Z23 Encounter for immunization: Secondary | ICD-10-CM | POA: Diagnosis not present

## 2018-12-30 DIAGNOSIS — Z6832 Body mass index (BMI) 32.0-32.9, adult: Secondary | ICD-10-CM | POA: Diagnosis not present

## 2018-12-30 DIAGNOSIS — I10 Essential (primary) hypertension: Secondary | ICD-10-CM | POA: Diagnosis not present

## 2018-12-30 DIAGNOSIS — E6609 Other obesity due to excess calories: Secondary | ICD-10-CM | POA: Diagnosis not present

## 2018-12-30 DIAGNOSIS — R001 Bradycardia, unspecified: Secondary | ICD-10-CM | POA: Diagnosis not present

## 2019-01-09 DIAGNOSIS — M47816 Spondylosis without myelopathy or radiculopathy, lumbar region: Secondary | ICD-10-CM | POA: Diagnosis not present

## 2019-01-09 DIAGNOSIS — M461 Sacroiliitis, not elsewhere classified: Secondary | ICD-10-CM | POA: Diagnosis not present

## 2019-02-06 DIAGNOSIS — E6609 Other obesity due to excess calories: Secondary | ICD-10-CM | POA: Diagnosis not present

## 2019-02-06 DIAGNOSIS — Z23 Encounter for immunization: Secondary | ICD-10-CM | POA: Diagnosis not present

## 2019-02-06 DIAGNOSIS — Z6832 Body mass index (BMI) 32.0-32.9, adult: Secondary | ICD-10-CM | POA: Diagnosis not present

## 2019-02-06 DIAGNOSIS — L0292 Furuncle, unspecified: Secondary | ICD-10-CM | POA: Diagnosis not present

## 2019-03-18 DIAGNOSIS — M48062 Spinal stenosis, lumbar region with neurogenic claudication: Secondary | ICD-10-CM | POA: Diagnosis not present

## 2019-03-18 DIAGNOSIS — G894 Chronic pain syndrome: Secondary | ICD-10-CM | POA: Diagnosis not present

## 2019-03-18 DIAGNOSIS — Z6833 Body mass index (BMI) 33.0-33.9, adult: Secondary | ICD-10-CM | POA: Diagnosis not present

## 2019-03-18 DIAGNOSIS — E119 Type 2 diabetes mellitus without complications: Secondary | ICD-10-CM | POA: Diagnosis not present

## 2019-03-18 DIAGNOSIS — D472 Monoclonal gammopathy: Secondary | ICD-10-CM | POA: Diagnosis not present

## 2019-03-18 DIAGNOSIS — I10 Essential (primary) hypertension: Secondary | ICD-10-CM | POA: Diagnosis not present

## 2019-03-28 DIAGNOSIS — N182 Chronic kidney disease, stage 2 (mild): Secondary | ICD-10-CM | POA: Diagnosis not present

## 2019-04-16 DIAGNOSIS — E1122 Type 2 diabetes mellitus with diabetic chronic kidney disease: Secondary | ICD-10-CM | POA: Diagnosis not present

## 2019-04-16 DIAGNOSIS — I129 Hypertensive chronic kidney disease with stage 1 through stage 4 chronic kidney disease, or unspecified chronic kidney disease: Secondary | ICD-10-CM | POA: Diagnosis not present

## 2019-04-16 DIAGNOSIS — R809 Proteinuria, unspecified: Secondary | ICD-10-CM | POA: Diagnosis not present

## 2019-04-16 DIAGNOSIS — C61 Malignant neoplasm of prostate: Secondary | ICD-10-CM | POA: Diagnosis not present

## 2019-04-16 DIAGNOSIS — N182 Chronic kidney disease, stage 2 (mild): Secondary | ICD-10-CM | POA: Diagnosis not present

## 2019-04-16 DIAGNOSIS — R972 Elevated prostate specific antigen [PSA]: Secondary | ICD-10-CM | POA: Diagnosis not present

## 2019-04-16 DIAGNOSIS — D472 Monoclonal gammopathy: Secondary | ICD-10-CM | POA: Diagnosis not present

## 2019-04-23 DIAGNOSIS — M5136 Other intervertebral disc degeneration, lumbar region: Secondary | ICD-10-CM | POA: Diagnosis not present

## 2019-04-23 DIAGNOSIS — N491 Inflammatory disorders of spermatic cord, tunica vaginalis and vas deferens: Secondary | ICD-10-CM | POA: Diagnosis not present

## 2019-04-23 DIAGNOSIS — M5410 Radiculopathy, site unspecified: Secondary | ICD-10-CM | POA: Diagnosis not present

## 2019-04-23 DIAGNOSIS — Z6834 Body mass index (BMI) 34.0-34.9, adult: Secondary | ICD-10-CM | POA: Diagnosis not present

## 2019-05-13 DIAGNOSIS — I10 Essential (primary) hypertension: Secondary | ICD-10-CM | POA: Diagnosis not present

## 2019-05-13 DIAGNOSIS — E1165 Type 2 diabetes mellitus with hyperglycemia: Secondary | ICD-10-CM | POA: Diagnosis not present

## 2019-05-22 DIAGNOSIS — L0292 Furuncle, unspecified: Secondary | ICD-10-CM | POA: Diagnosis not present

## 2019-05-22 DIAGNOSIS — Z6834 Body mass index (BMI) 34.0-34.9, adult: Secondary | ICD-10-CM | POA: Diagnosis not present

## 2019-05-22 DIAGNOSIS — B009 Herpesviral infection, unspecified: Secondary | ICD-10-CM | POA: Diagnosis not present

## 2019-05-22 DIAGNOSIS — D472 Monoclonal gammopathy: Secondary | ICD-10-CM | POA: Diagnosis not present

## 2019-05-22 DIAGNOSIS — E6609 Other obesity due to excess calories: Secondary | ICD-10-CM | POA: Diagnosis not present

## 2019-05-22 DIAGNOSIS — Z1389 Encounter for screening for other disorder: Secondary | ICD-10-CM | POA: Diagnosis not present

## 2019-05-28 DIAGNOSIS — N182 Chronic kidney disease, stage 2 (mild): Secondary | ICD-10-CM | POA: Diagnosis not present

## 2019-06-04 ENCOUNTER — Ambulatory Visit (INDEPENDENT_AMBULATORY_CARE_PROVIDER_SITE_OTHER): Payer: Medicare HMO | Admitting: Urology

## 2019-06-04 DIAGNOSIS — N183 Chronic kidney disease, stage 3 (moderate): Secondary | ICD-10-CM

## 2019-06-04 DIAGNOSIS — R309 Painful micturition, unspecified: Secondary | ICD-10-CM | POA: Diagnosis not present

## 2019-06-04 DIAGNOSIS — R350 Frequency of micturition: Secondary | ICD-10-CM

## 2019-06-04 DIAGNOSIS — N3941 Urge incontinence: Secondary | ICD-10-CM

## 2019-06-04 DIAGNOSIS — C61 Malignant neoplasm of prostate: Secondary | ICD-10-CM | POA: Diagnosis not present

## 2019-06-12 DIAGNOSIS — I10 Essential (primary) hypertension: Secondary | ICD-10-CM | POA: Diagnosis not present

## 2019-06-12 DIAGNOSIS — E1165 Type 2 diabetes mellitus with hyperglycemia: Secondary | ICD-10-CM | POA: Diagnosis not present

## 2019-06-19 DIAGNOSIS — B078 Other viral warts: Secondary | ICD-10-CM | POA: Diagnosis not present

## 2019-06-19 DIAGNOSIS — B0089 Other herpesviral infection: Secondary | ICD-10-CM | POA: Diagnosis not present

## 2019-06-19 DIAGNOSIS — B9689 Other specified bacterial agents as the cause of diseases classified elsewhere: Secondary | ICD-10-CM | POA: Diagnosis not present

## 2019-06-19 DIAGNOSIS — L02224 Furuncle of groin: Secondary | ICD-10-CM | POA: Diagnosis not present

## 2019-06-19 DIAGNOSIS — L821 Other seborrheic keratosis: Secondary | ICD-10-CM | POA: Diagnosis not present

## 2019-07-11 ENCOUNTER — Ambulatory Visit (INDEPENDENT_AMBULATORY_CARE_PROVIDER_SITE_OTHER): Payer: Medicare HMO | Admitting: Urology

## 2019-07-11 ENCOUNTER — Telehealth: Payer: Self-pay | Admitting: Medical Oncology

## 2019-07-11 DIAGNOSIS — R350 Frequency of micturition: Secondary | ICD-10-CM

## 2019-07-11 DIAGNOSIS — C61 Malignant neoplasm of prostate: Secondary | ICD-10-CM | POA: Diagnosis not present

## 2019-07-11 DIAGNOSIS — R309 Painful micturition, unspecified: Secondary | ICD-10-CM | POA: Diagnosis not present

## 2019-07-11 DIAGNOSIS — N3941 Urge incontinence: Secondary | ICD-10-CM

## 2019-07-11 DIAGNOSIS — N183 Chronic kidney disease, stage 3 (moderate): Secondary | ICD-10-CM

## 2019-07-11 NOTE — Telephone Encounter (Signed)
I spoke with [facility] in [place] regarding patient's complaints of having trouble urinating and pain, for the past 3 days and it is getting worse. He is not able to get thru to Dr.McKenzie's office in [place]. I have called the nurse line, without an answer. Jenny Reichmann states the office is open today and she will send an e-mail with the above and ask them to contact patient.

## 2019-07-11 NOTE — Telephone Encounter (Signed)
Mr. Boltz called stating he was treated with radiation 08/2018. He is now having trouble urinating and it has gotten worse over the past three days. He only urinates a tiny bit and it is extremely painful. His urologist is Dr. Caprice Beaver in [place] and he asked if I can help him get an appointment today. The office is Linna Hoff is not answering.  He states he can not go all week-end feeling this way. I will call and see if I can get him an appointment and if not, he should go to the ER to be evaluated. I will return his call.

## 2019-07-11 NOTE — Telephone Encounter (Signed)
I spoke with Gregory Diaz to let him know I called [facility] in [place] and they are sending Dr. Noland Fordyce nurse a message about his symptoms. He should be receiving a call. I asked him to call me back in an hour if he does not hear from them. He voiced understanding and thanked me for helping him.

## 2019-07-14 DIAGNOSIS — L820 Inflamed seborrheic keratosis: Secondary | ICD-10-CM | POA: Diagnosis not present

## 2019-07-14 DIAGNOSIS — I10 Essential (primary) hypertension: Secondary | ICD-10-CM | POA: Diagnosis not present

## 2019-07-14 DIAGNOSIS — E1165 Type 2 diabetes mellitus with hyperglycemia: Secondary | ICD-10-CM | POA: Diagnosis not present

## 2019-08-05 DIAGNOSIS — N182 Chronic kidney disease, stage 2 (mild): Secondary | ICD-10-CM | POA: Diagnosis not present

## 2019-09-02 ENCOUNTER — Other Ambulatory Visit: Payer: Self-pay | Admitting: *Deleted

## 2019-09-02 DIAGNOSIS — R69 Illness, unspecified: Secondary | ICD-10-CM | POA: Diagnosis not present

## 2019-09-02 DIAGNOSIS — Z20822 Contact with and (suspected) exposure to covid-19: Secondary | ICD-10-CM

## 2019-09-03 ENCOUNTER — Ambulatory Visit (INDEPENDENT_AMBULATORY_CARE_PROVIDER_SITE_OTHER): Payer: Medicare HMO | Admitting: Urology

## 2019-09-03 DIAGNOSIS — N3941 Urge incontinence: Secondary | ICD-10-CM

## 2019-09-03 LAB — NOVEL CORONAVIRUS, NAA: SARS-CoV-2, NAA: NOT DETECTED

## 2019-09-04 DIAGNOSIS — Z0000 Encounter for general adult medical examination without abnormal findings: Secondary | ICD-10-CM | POA: Diagnosis not present

## 2019-09-04 DIAGNOSIS — I10 Essential (primary) hypertension: Secondary | ICD-10-CM | POA: Diagnosis not present

## 2019-09-04 DIAGNOSIS — Z1389 Encounter for screening for other disorder: Secondary | ICD-10-CM | POA: Diagnosis not present

## 2019-09-04 DIAGNOSIS — G894 Chronic pain syndrome: Secondary | ICD-10-CM | POA: Diagnosis not present

## 2019-09-04 DIAGNOSIS — Z6836 Body mass index (BMI) 36.0-36.9, adult: Secondary | ICD-10-CM | POA: Diagnosis not present

## 2019-09-04 DIAGNOSIS — E119 Type 2 diabetes mellitus without complications: Secondary | ICD-10-CM | POA: Diagnosis not present

## 2019-09-04 DIAGNOSIS — E6609 Other obesity due to excess calories: Secondary | ICD-10-CM | POA: Diagnosis not present

## 2019-09-05 DIAGNOSIS — E6609 Other obesity due to excess calories: Secondary | ICD-10-CM | POA: Diagnosis not present

## 2019-09-05 DIAGNOSIS — Z6836 Body mass index (BMI) 36.0-36.9, adult: Secondary | ICD-10-CM | POA: Diagnosis not present

## 2019-09-05 DIAGNOSIS — E119 Type 2 diabetes mellitus without complications: Secondary | ICD-10-CM | POA: Diagnosis not present

## 2019-09-05 DIAGNOSIS — Z1389 Encounter for screening for other disorder: Secondary | ICD-10-CM | POA: Diagnosis not present

## 2019-09-05 DIAGNOSIS — Z0000 Encounter for general adult medical examination without abnormal findings: Secondary | ICD-10-CM | POA: Diagnosis not present

## 2019-09-15 ENCOUNTER — Telehealth: Payer: Self-pay | Admitting: Family Medicine

## 2019-09-15 NOTE — Telephone Encounter (Signed)
Patient called in and received his covid test result °

## 2019-09-25 DIAGNOSIS — R7309 Other abnormal glucose: Secondary | ICD-10-CM | POA: Diagnosis not present

## 2019-09-25 DIAGNOSIS — Z6836 Body mass index (BMI) 36.0-36.9, adult: Secondary | ICD-10-CM | POA: Diagnosis not present

## 2019-09-25 DIAGNOSIS — E6609 Other obesity due to excess calories: Secondary | ICD-10-CM | POA: Diagnosis not present

## 2019-09-25 DIAGNOSIS — I10 Essential (primary) hypertension: Secondary | ICD-10-CM | POA: Diagnosis not present

## 2019-10-01 DIAGNOSIS — E119 Type 2 diabetes mellitus without complications: Secondary | ICD-10-CM | POA: Diagnosis not present

## 2019-10-06 DIAGNOSIS — R972 Elevated prostate specific antigen [PSA]: Secondary | ICD-10-CM | POA: Diagnosis not present

## 2019-10-06 DIAGNOSIS — N182 Chronic kidney disease, stage 2 (mild): Secondary | ICD-10-CM | POA: Diagnosis not present

## 2019-10-07 ENCOUNTER — Other Ambulatory Visit: Payer: Self-pay | Admitting: *Deleted

## 2019-10-07 NOTE — Patient Outreach (Signed)
[place] [facility]) Care Management  10/07/2019  Gershon Dewan Quint Sr. 12/22/1950 [id number]  Pt. Needs education on monitoring blood sugars at home.

## 2019-10-07 NOTE — Patient Outreach (Signed)
[place] [facility]) Care Management  10/07/2019  Gregory Chorley Kittelson Sr. 1951/02/19 [id number]  [name] was seen at PCP's office on 10/06/2019 for retinal screening.

## 2019-10-08 ENCOUNTER — Ambulatory Visit (INDEPENDENT_AMBULATORY_CARE_PROVIDER_SITE_OTHER): Payer: Medicare HMO | Admitting: Urology

## 2019-10-08 DIAGNOSIS — C61 Malignant neoplasm of prostate: Secondary | ICD-10-CM

## 2019-10-08 DIAGNOSIS — N3941 Urge incontinence: Secondary | ICD-10-CM

## 2019-10-14 ENCOUNTER — Other Ambulatory Visit: Payer: Self-pay

## 2019-10-14 NOTE — Patient Outreach (Signed)
New referral for screening: Source of referral: [facility] retinal eye exam.  Reviewed Medical record. [facility] noted last A1c of 5.6 on 09/25/2019.  Placed call to patient for screening purposes. No answer. Left a message requesting a call back.  PLAN: will mail outreach letter and call again in 3 days.  Tomasa Rand, RN, BSN, CEN [facility] [employer] [phone]

## 2019-10-17 ENCOUNTER — Other Ambulatory Visit: Payer: Self-pay

## 2019-10-17 NOTE — Patient Outreach (Signed)
Telephone screening: Post eye exam screening attempt #2  Placed call to patient and a lady answered and states she will ask him to call me.  PLAN: I will await a call back. If no response will call back in 3 business days.  Tomasa Rand, RN, BSN, CEN [facility] [employer] [phone]

## 2019-10-22 ENCOUNTER — Other Ambulatory Visit: Payer: Self-pay

## 2019-10-22 NOTE — Patient Outreach (Signed)
Screening:  Placed call to patient for post eye exam screening. This call attempt was unsuccessful. This is the 3rd attempt.  PLAN: will close case on 10/28/2019 if no response to messages left or letter.  Tomasa Rand, RN, BSN, CEN [facility] [employer] [phone]

## 2019-10-28 ENCOUNTER — Other Ambulatory Visit: Payer: Self-pay

## 2019-10-28 NOTE — Patient Outreach (Signed)
Case closure:  Referral source: [facility] eye exam  Unable to reach patient after calling x 3 and send outreach letter by mail.  PLAN: will close case as unable to reach.  Tomasa Rand, RN, BSN, CEN [facility] [employer] [phone]

## 2019-11-12 DIAGNOSIS — I10 Essential (primary) hypertension: Secondary | ICD-10-CM | POA: Diagnosis not present

## 2019-11-12 DIAGNOSIS — E119 Type 2 diabetes mellitus without complications: Secondary | ICD-10-CM | POA: Diagnosis not present

## 2019-12-10 ENCOUNTER — Other Ambulatory Visit: Payer: Self-pay

## 2019-12-10 ENCOUNTER — Other Ambulatory Visit (HOSPITAL_COMMUNITY)
Admission: AD | Admit: 2019-12-10 | Discharge: 2019-12-10 | Disposition: A | Discharge status: Home / Self Care | Payer: Medicare HMO | Source: Other Acute Inpatient Hospital | Attending: Urology | Admitting: Urology

## 2019-12-10 ENCOUNTER — Ambulatory Visit: Payer: Medicare HMO

## 2019-12-10 DIAGNOSIS — R3129 Other microscopic hematuria: Secondary | ICD-10-CM | POA: Insufficient documentation

## 2019-12-10 LAB — URINALYSIS, ROUTINE W REFLEX MICROSCOPIC
Bacteria, UA: NONE SEEN
Bilirubin Urine: NEGATIVE
Glucose, UA: NEGATIVE mg/dL
Ketones, ur: NEGATIVE mg/dL
Leukocytes,Ua: NEGATIVE
Nitrite: NEGATIVE
Protein, ur: NEGATIVE mg/dL
Specific Gravity, Urine: 1.02 (ref 1.005–1.030)
pH: 5 (ref 5.0–8.0)

## 2019-12-11 DIAGNOSIS — L304 Erythema intertrigo: Secondary | ICD-10-CM | POA: Diagnosis not present

## 2019-12-11 LAB — URINE CULTURE: Culture: <10000 — AB

## 2019-12-17 ENCOUNTER — Other Ambulatory Visit: Payer: Self-pay

## 2019-12-17 ENCOUNTER — Telehealth: Payer: Self-pay

## 2019-12-17 DIAGNOSIS — R3129 Other microscopic hematuria: Secondary | ICD-10-CM

## 2019-12-17 MED ORDER — SULFAMETHOXAZOLE-TRIMETHOPRIM 800-160 MG PO TABS: 1.0000 | ORAL_TABLET | Freq: Two times a day (BID) | ORAL | 0 refills | Status: DC

## 2019-12-17 NOTE — Telephone Encounter (Signed)
-----   Message from Cleon Gustin, MD sent at 12/17/2019 11:14 AM EST ----- Bactrim DS BID for 7 days ----- Message ----- From: Dorisann Frames, RN Sent: 12/16/2019   1:00 PM EST To: Cleon Gustin, MD  Urine culture

## 2019-12-17 NOTE — Telephone Encounter (Signed)
Pt notified of culture results and antibiotic sent to pharmacy. Pt voiced understanding.

## 2020-01-11 DIAGNOSIS — I10 Essential (primary) hypertension: Secondary | ICD-10-CM | POA: Diagnosis not present

## 2020-01-11 DIAGNOSIS — E6609 Other obesity due to excess calories: Secondary | ICD-10-CM | POA: Diagnosis not present

## 2020-01-11 DIAGNOSIS — E119 Type 2 diabetes mellitus without complications: Secondary | ICD-10-CM | POA: Diagnosis not present

## 2020-01-12 DIAGNOSIS — N182 Chronic kidney disease, stage 2 (mild): Secondary | ICD-10-CM | POA: Diagnosis not present

## 2020-01-23 DIAGNOSIS — N041 Nephrotic syndrome with focal and segmental glomerular lesions: Secondary | ICD-10-CM | POA: Diagnosis not present

## 2020-01-23 DIAGNOSIS — Z720 Tobacco use: Secondary | ICD-10-CM | POA: Diagnosis not present

## 2020-01-23 DIAGNOSIS — R809 Proteinuria, unspecified: Secondary | ICD-10-CM | POA: Diagnosis not present

## 2020-01-23 DIAGNOSIS — C61 Malignant neoplasm of prostate: Secondary | ICD-10-CM | POA: Diagnosis not present

## 2020-01-23 DIAGNOSIS — D472 Monoclonal gammopathy: Secondary | ICD-10-CM | POA: Diagnosis not present

## 2020-01-23 DIAGNOSIS — D631 Anemia in chronic kidney disease: Secondary | ICD-10-CM | POA: Diagnosis not present

## 2020-01-23 DIAGNOSIS — E669 Obesity, unspecified: Secondary | ICD-10-CM | POA: Diagnosis not present

## 2020-01-23 DIAGNOSIS — N182 Chronic kidney disease, stage 2 (mild): Secondary | ICD-10-CM | POA: Diagnosis not present

## 2020-01-23 DIAGNOSIS — I129 Hypertensive chronic kidney disease with stage 1 through stage 4 chronic kidney disease, or unspecified chronic kidney disease: Secondary | ICD-10-CM | POA: Diagnosis not present

## 2020-01-23 DIAGNOSIS — E1122 Type 2 diabetes mellitus with diabetic chronic kidney disease: Secondary | ICD-10-CM | POA: Diagnosis not present

## 2020-01-26 ENCOUNTER — Telehealth: Payer: Self-pay | Admitting: Urology

## 2020-01-26 NOTE — Telephone Encounter (Signed)
Called pt back. No answer no way to leave message.

## 2020-01-26 NOTE — Telephone Encounter (Signed)
Please call pt at [phone] regarding a bladder issue. Pt needs advice.

## 2020-01-27 NOTE — Telephone Encounter (Signed)
Pt scheduled to see md on 3/17

## 2020-01-28 ENCOUNTER — Other Ambulatory Visit (HOSPITAL_COMMUNITY)
Admission: RE | Admit: 2020-01-28 | Discharge: 2020-01-28 | Disposition: A | Discharge status: Home / Self Care | Payer: Medicare HMO | Source: Other Acute Inpatient Hospital | Attending: Urology | Admitting: Urology

## 2020-01-28 ENCOUNTER — Encounter: Payer: Self-pay | Admitting: Urology

## 2020-01-28 ENCOUNTER — Other Ambulatory Visit: Payer: Self-pay | Admitting: Urology

## 2020-01-28 ENCOUNTER — Ambulatory Visit (INDEPENDENT_AMBULATORY_CARE_PROVIDER_SITE_OTHER): Payer: Medicare HMO | Admitting: Urology

## 2020-01-28 ENCOUNTER — Other Ambulatory Visit: Payer: Self-pay

## 2020-01-28 VITALS — BP 167/82 | HR 50 | Temp 97.1°F | Ht 74.0 in | Wt 277.0 lb

## 2020-01-28 DIAGNOSIS — R3129 Other microscopic hematuria: Secondary | ICD-10-CM

## 2020-01-28 DIAGNOSIS — N138 Other obstructive and reflux uropathy: Secondary | ICD-10-CM | POA: Diagnosis not present

## 2020-01-28 DIAGNOSIS — R32 Unspecified urinary incontinence: Secondary | ICD-10-CM | POA: Diagnosis not present

## 2020-01-28 DIAGNOSIS — N3281 Overactive bladder: Secondary | ICD-10-CM | POA: Diagnosis not present

## 2020-01-28 DIAGNOSIS — N3000 Acute cystitis without hematuria: Secondary | ICD-10-CM

## 2020-01-28 DIAGNOSIS — N401 Enlarged prostate with lower urinary tract symptoms: Secondary | ICD-10-CM | POA: Diagnosis not present

## 2020-01-28 LAB — POCT URINALYSIS DIPSTICK
Bilirubin, UA: NEGATIVE
Glucose, UA: NEGATIVE
Ketones, UA: NEGATIVE
Leukocytes, UA: NEGATIVE
Nitrite, UA: NEGATIVE
Protein, UA: NEGATIVE
Spec Grav, UA: 1.01 (ref 1.010–1.025)
Urobilinogen, UA: 0.2 E.U./dL
pH, UA: 5 (ref 5.0–8.0)

## 2020-01-28 LAB — BLADDER SCAN AMB NON-IMAGING: Scan Result: 42.1

## 2020-01-28 MED ORDER — SOLIFENACIN SUCCINATE 5 MG PO TABS: 5.0000 mg | ORAL_TABLET | Freq: Every day | ORAL | 11 refills | Status: DC

## 2020-01-28 MED ORDER — SULFAMETHOXAZOLE-TRIMETHOPRIM 800-160 MG PO TABS: 1.0000 | ORAL_TABLET | Freq: Two times a day (BID) | ORAL | 0 refills | Status: DC

## 2020-01-28 MED ORDER — ALFUZOSIN HCL ER 10 MG PO TB24: 10.0000 mg | ORAL_TABLET | Freq: Every day | ORAL | 3 refills | Status: DC

## 2020-01-28 NOTE — Progress Notes (Signed)
Urological Symptom Review  Patient is experiencing the following symptoms: Hard to postpone urination Burning/pain with urination Get up at night to urinate Trouble starting stream Have to strain to urinate Blood in urine Urinary tract infection Injury to kidneys/bladder Weak stream   Review of Systems  Gastrointestinal (upper)  : Negative for upper GI symptoms  Gastrointestinal (lower) : Negative for lower GI symptoms  Constitutional : Negative for symptoms  Skin: Negative for skin symptoms  Eyes: Negative for eye symptoms  Ear/Nose/Throat : Negative for Ear/Nose/Throat symptoms  Hematologic/Lymphatic: Negative for Hematologic/Lymphatic symptoms  Cardiovascular : Negative for cardiovascular symptoms  Respiratory : Negative for respiratory symptoms  Endocrine: Negative for endocrine symptoms  Musculoskeletal: Negative for musculoskeletal symptoms  Neurological: Negative for neurological symptoms  Psychologic: Negative for psychiatric symptoms

## 2020-01-28 NOTE — Addendum Note (Signed)
Addended by: Dorisann Frames on: 01/28/2020 11:13 AM   Modules accepted: Orders

## 2020-01-28 NOTE — Progress Notes (Signed)
01/28/2020 10:21 AM   Gregory Faster Shibley Sr. 08-06-1951 [id number]  Referring provider: Sharilyn Sites, MD [address] [place],  [place] [address]  Urinary incontinence  HPI: Mr Gillitzer is a 69yo with a hx of BPH with LUTS, urge incontinence here for followup. He is currently on uroxatral 10mg . He was on mirabegron 50mg  but stopped the medication due to cost. Over the past 3-4 days he has noted worsening urinary urgency, urge incontinence and dysuria. Nocturia 7-10x the past 3 nights. No hematuria. PVR reviewed and is 42cc. No hx of glaucoma   PMH: Past Medical History:  Diagnosis Date  . Abnormal SPEP 09/09/2015  . Anxiety   . Arthritis    DDD, low back  . Early cataracts, bilateral   . Enlarged prostate   . Heart murmur    was told at age 72, but has not had any problems  . Hypertension   . MGUS (monoclonal gammopathy of unknown significance) 01/29/2016  . Prostate cancer [facility])     Surgical History: Past Surgical History:  Procedure Laterality Date  . BACK SURGERY  2004, 2015  . COLONOSCOPY    . EYE SURGERY     L eye- as a child   . FRACTURE SURGERY Right    thumb  . HEMORROIDECTOMY    . HERNIA REPAIR     umbilical hernia repair  . MASS EXCISION Right 07/11/2016   Procedure: EXCISION 6CM RIGHT SHOULDER MASS;  Surgeon: Vickie Epley, MD;  Location: [facility];  Service: Vascular;  Laterality: Right;  . PROSTATE BIOPSY    . TONSILLECTOMY    . WOUND DEBRIDEMENT N/A 10/25/2018   Procedure: DEBRIDEMENT OF MUSCLE AND FASCIA AT UMBILICUS;  Surgeon: Virl Cagey, MD;  Location: [facility];  Service: General;  Laterality: N/A;    Home Medications:  Allergies as of 01/28/2020      Reactions   Penicillins Swelling, Other (See Comments)   Has patient had a PCN reaction causing immediate rash, facial/tongue/throat swelling, SOB or lightheadedness with hypotension: Yes Has patient had a PCN reaction causing severe rash involving mucus membranes or skin necrosis: No Has  patient had a PCN reaction that required hospitalization No Has patient had a PCN reaction occurring within the last 10 years: No If all of the above answers are "NO", then may proceed with Cephalosporin use.      Medication List       Accurate as of January 28, 2020 10:21 AM. If you have any questions, ask your nurse or doctor.        STOP taking these medications   Myrbetriq 50 MG Tb24 tablet Generic drug: mirabegron ER Stopped by: Nicolette Bang, MD     TAKE these medications   alfuzosin 10 MG 24 hr tablet Commonly known as: UROXATRAL Take 10 mg by mouth at bedtime.   doxycycline 100 MG capsule Commonly known as: VIBRAMYCIN Take 100 mg by mouth 2 (two) times daily as needed.   gabapentin 300 MG capsule Commonly known as: NEURONTIN Take 300 mg by mouth daily as needed (pain/neuropathy).   Januvia 100 MG tablet Generic drug: sitaGLIPtin Take 100 mg by mouth daily.   lisinopril 20 MG tablet Commonly known as: ZESTRIL   olmesartan 40 MG tablet Commonly known as: BENICAR Take 40 mg by mouth daily.   oxyCODONE-acetaminophen 5-325 MG tablet Commonly known as: PERCOCET/ROXICET One tab every 4-6 hours PRN   senna 8.6 MG tablet Commonly known as: SENOKOT Take 1-3 tablets by mouth  daily as needed for constipation. Will take up to 3 if needed   sulfamethoxazole-trimethoprim 800-160 MG tablet Commonly known as: BACTRIM DS Take 1 tablet by mouth every 12 (twelve) hours.   traZODone 50 MG tablet Commonly known as: DESYREL Take 50 mg by mouth at bedtime as needed for sleep. What changed: Another medication with the same name was removed. Continue taking this medication, and follow the directions you see here. Changed by: Nicolette Bang, MD   valACYclovir 500 MG tablet Commonly known as: VALTREX Take 500 mg by mouth daily as needed (for fever blister/cold sores.).       Allergies:  Allergies  Allergen Reactions  . Penicillins Swelling and Other (See Comments)     Has patient had a PCN reaction causing immediate rash, facial/tongue/throat swelling, SOB or lightheadedness with hypotension: Yes Has patient had a PCN reaction causing severe rash involving mucus membranes or skin necrosis: No Has patient had a PCN reaction that required hospitalization No Has patient had a PCN reaction occurring within the last 10 years: No If all of the above answers are "NO", then may proceed with Cephalosporin use.     Family History: Family History  Problem Relation Age of Onset  . Gallstones Father   . Ulcers Father   . Alcohol abuse Father   . Diabetes Daughter   . Diabetes Brother   . Diabetes Sister   . Breast cancer Sister     Social History:  reports that he has been smoking cigarettes. He has a 25.00 pack-year smoking history. He has never used smokeless tobacco. He reports that he does not drink alcohol or use drugs.  ROS: All other review of systems were reviewed and are negative except what is noted above in HPI  Physical Exam: BP (!) 167/82   Pulse (!) 50   Temp (!) 97.1 F (36.2 C)   Ht 6\' 2"  (1.88 m)   Wt 277 lb (125.6 kg)   BMI 35.56 kg/m   Constitutional:  Alert and oriented, No acute distress. HEENT: [place] AT, moist mucus membranes.  Trachea midline, no masses. Cardiovascular: No clubbing, cyanosis, or edema. Respiratory: Normal respiratory effort, no increased work of breathing. GI: Abdomen is soft, nontender, nondistended, no abdominal masses GU: No CVA tenderness Lymph: No cervical or inguinal lymphadenopathy. Skin: No rashes, bruises or suspicious lesions. Neurologic: Grossly intact, no focal deficits, moving all 4 extremities. Psychiatric: Normal mood and affect.  Laboratory Data: Lab Results  Component Value Date   WBC 4.5 10/08/2018   HGB 13.8 10/16/2018   HCT 43.4 10/16/2018   MCV 91.9 10/08/2018   PLT 219 10/08/2018    Lab Results  Component Value Date   CREATININE 1.26 (H) 10/08/2018    No results found  for: PSA  No results found for: TESTOSTERONE  Lab Results  Component Value Date   HGBA1C 5.5 10/16/2018    Urinalysis    Component Value Date/Time   COLORURINE YELLOW 12/10/2019 0901   APPEARANCEUR CLEAR 12/10/2019 0901   LABSPEC 1.020 12/10/2019 0901   PHURINE 5.0 12/10/2019 0901   GLUCOSEU NEGATIVE 12/10/2019 0901   HGBUR SMALL (A) 12/10/2019 0901   BILIRUBINUR NEGATIVE 12/10/2019 0901   BILIRUBINUR small (A) 08/22/2015 1242   KETONESUR NEGATIVE 12/10/2019 0901   PROTEINUR NEGATIVE 12/10/2019 0901   UROBILINOGEN 0.2 08/22/2015 1609   UROBILINOGEN 0.2 08/22/2015 1242   NITRITE NEGATIVE 12/10/2019 0901   LEUKOCYTESUR NEGATIVE 12/10/2019 0901    Lab Results  Component Value Date  BACTERIA NONE SEEN 12/10/2019    Pertinent Imaging:  No results found for this or any previous visit. No results found for this or any previous visit. No results found for this or any previous visit. No results found for this or any previous visit. Results for orders placed during the hospital encounter of 08/22/15  US Renal   Narrative CLINICAL DATA:  Acute kidney injury  EXAM: RENAL / URINARY TRACT ULTRASOUND COMPLETE  COMPARISON:  None.  FINDINGS: Right Kidney:  Length: 15.6 cm. Numerous cysts in the upper and midpole all measuring less than or equal to 5 cm. No hydronephrosis. Normal echogenicity.  Left Kidney:  Length: 15.1 cm. Two lower pole cysts 1 measuring 4.4 cm in the other measuring 3.6 cm. No hydronephrosis. Normal echogenicity.  Bladder:  Appears normal for degree of bladder distention.  IMPRESSION: Bilateral simple cysts.  No acute abnormalities.   Electronically Signed   By: Skipper Cliche M.D.   On: 08/24/2015 08:28    No results found for this or any previous visit. No results found for this or any previous visit. No results found for this or any previous visit.  Assessment & Plan:    1. Urinary incontinence, unspecified type -toviaz samples  given. If the patient sees improvement in his LUTS we will refill meds - BLADDER SCAN AMB NON-IMAGING - POCT urinalysis dipstick  2. Benign prostatic hyperplasia with urinary obstruction -continue uroxatral 10mg  qhs. Refill sent  3. OAB (overactive bladder) -toviaz samples given  4. Acute cystitis without hematuria -urine for culture -bactrim DS BID for 7 days  No follow-ups on file.  Nicolette Bang, MD  [facility] [place]

## 2020-01-28 NOTE — Patient Instructions (Signed)
Urinary Tract Infection, Adult A urinary tract infection (UTI) is an infection of any part of the urinary tract. The urinary tract includes:  The kidneys.  The ureters.  The bladder.  The urethra. These organs make, store, and get rid of pee (urine) in the body. What are the causes? This is caused by germs (bacteria) in your genital area. These germs grow and cause swelling (inflammation) of your urinary tract. What increases the risk? You are more likely to develop this condition if:  You have a small, thin tube (catheter) to drain pee.  You cannot control when you pee or poop (incontinence).  You are male, and: ? You use these methods to prevent pregnancy:  A medicine that kills sperm (spermicide).  A device that blocks sperm (diaphragm). ? You have low levels of a male hormone (estrogen). ? You are pregnant.  You have genes that add to your risk.  You are sexually active.  You take antibiotic medicines.  You have trouble peeing because of: ? A prostate that is bigger than normal, if you are male. ? A blockage in the part of your body that drains pee from the bladder (urethra). ? A kidney stone. ? A nerve condition that affects your bladder (neurogenic bladder). ? Not getting enough to drink. ? Not peeing often enough.  You have other conditions, such as: ? Diabetes. ? A weak disease-fighting system (immune system). ? Sickle cell disease. ? Gout. ? Injury of the spine. What are the signs or symptoms? Symptoms of this condition include:  Needing to pee right away (urgently).  Peeing often.  Peeing small amounts often.  Pain or burning when peeing.  Blood in the pee.  Pee that smells bad or not like normal.  Trouble peeing.  Pee that is cloudy.  Fluid coming from the vagina, if you are male.  Pain in the belly or lower back. Other symptoms include:  Throwing up (vomiting).  No urge to eat.  Feeling mixed up (confused).  Being tired  and grouchy (irritable).  A fever.  Watery poop (diarrhea). How is this treated? This condition may be treated with:  Antibiotic medicine.  Other medicines.  Drinking enough water. Follow these instructions at home:  Medicines  Take over-the-counter and prescription medicines only as told by your doctor.  If you were prescribed an antibiotic medicine, take it as told by your doctor. Do not stop taking it even if you start to feel better. General instructions  Make sure you: ? Pee until your bladder is empty. ? Do not hold pee for a long time. ? Empty your bladder after sex. ? Wipe from front to back after pooping if you are a male. Use each tissue one time when you wipe.  Drink enough fluid to keep your pee pale yellow.  Keep all follow-up visits as told by your doctor. This is important. Contact a doctor if:  You do not get better after 1-2 days.  Your symptoms go away and then come back. Get help right away if:  You have very bad back pain.  You have very bad pain in your lower belly.  You have a fever.  You are sick to your stomach (nauseous).  You are throwing up. Summary  A urinary tract infection (UTI) is an infection of any part of the urinary tract.  This condition is caused by germs in your genital area.  There are many risk factors for a UTI. These include having a small, thin   tube to drain pee and not being able to control when you pee or poop.  Treatment includes antibiotic medicines for germs.  Drink enough fluid to keep your pee pale yellow. This information is not intended to replace advice given to you by your health care provider. Make sure you discuss any questions you have with your health care provider. Document Revised: 10/17/2018 Document Reviewed: 05/09/2018 Elsevier Patient Education  2020 Elsevier Inc[employer]  

## 2020-01-30 LAB — URINE CULTURE: Culture: <10000 — AB

## 2020-02-05 DIAGNOSIS — U071 COVID-19: Secondary | ICD-10-CM | POA: Diagnosis not present

## 2020-02-09 ENCOUNTER — Telehealth: Payer: Self-pay

## 2020-02-09 NOTE — Telephone Encounter (Signed)
-----   Message from Cleon Gustin, MD sent at 02/09/2020  7:56 AM EDT ----- No treatment ----- Message ----- From: Dorisann Frames, RN Sent: 01/30/2020  11:05 AM EDT To: Cleon Gustin, MD  Urine culture

## 2020-02-09 NOTE — Telephone Encounter (Signed)
No answer. Left message to return call.

## 2020-02-10 DIAGNOSIS — I10 Essential (primary) hypertension: Secondary | ICD-10-CM | POA: Diagnosis not present

## 2020-02-10 DIAGNOSIS — E119 Type 2 diabetes mellitus without complications: Secondary | ICD-10-CM | POA: Diagnosis not present

## 2020-02-27 ENCOUNTER — Telehealth: Payer: Self-pay | Admitting: Urology

## 2020-02-27 MED ORDER — FESOTERODINE FUMARATE ER 4 MG PO TB24: 4.0000 mg | ORAL_TABLET | Freq: Every day | ORAL | 11 refills | Status: DC

## 2020-02-27 NOTE — Telephone Encounter (Signed)
Patient called about Toviaz 4mg  samples  We do not have any in office  Patient stated he was taking another medicine before (Mybetriq? Maybe) but that the [name] worked better   He has 2 pills left  Is uncomfortable & restless due to having to urinate so frequently.  Requesting call back ASAP Today.  Thanks

## 2020-02-27 NOTE — Telephone Encounter (Signed)
I have ordered more toviaz samples for office.  See pt c/o of urinary issues below.

## 2020-02-27 NOTE — Telephone Encounter (Signed)
See note

## 2020-03-10 DIAGNOSIS — Z6836 Body mass index (BMI) 36.0-36.9, adult: Secondary | ICD-10-CM | POA: Diagnosis not present

## 2020-03-10 DIAGNOSIS — D472 Monoclonal gammopathy: Secondary | ICD-10-CM | POA: Diagnosis not present

## 2020-03-10 DIAGNOSIS — Z125 Encounter for screening for malignant neoplasm of prostate: Secondary | ICD-10-CM | POA: Diagnosis not present

## 2020-03-10 DIAGNOSIS — G894 Chronic pain syndrome: Secondary | ICD-10-CM | POA: Diagnosis not present

## 2020-03-10 DIAGNOSIS — I10 Essential (primary) hypertension: Secondary | ICD-10-CM | POA: Diagnosis not present

## 2020-03-10 DIAGNOSIS — E6609 Other obesity due to excess calories: Secondary | ICD-10-CM | POA: Diagnosis not present

## 2020-03-10 DIAGNOSIS — R002 Palpitations: Secondary | ICD-10-CM | POA: Diagnosis not present

## 2020-03-10 DIAGNOSIS — Z1389 Encounter for screening for other disorder: Secondary | ICD-10-CM | POA: Diagnosis not present

## 2020-03-10 DIAGNOSIS — Z13220 Encounter for screening for lipoid disorders: Secondary | ICD-10-CM | POA: Diagnosis not present

## 2020-03-10 DIAGNOSIS — E559 Vitamin D deficiency, unspecified: Secondary | ICD-10-CM | POA: Diagnosis not present

## 2020-03-10 DIAGNOSIS — E119 Type 2 diabetes mellitus without complications: Secondary | ICD-10-CM | POA: Diagnosis not present

## 2020-03-10 DIAGNOSIS — U071 COVID-19: Secondary | ICD-10-CM | POA: Diagnosis not present

## 2020-03-12 DIAGNOSIS — I10 Essential (primary) hypertension: Secondary | ICD-10-CM | POA: Diagnosis not present

## 2020-03-12 DIAGNOSIS — E6609 Other obesity due to excess calories: Secondary | ICD-10-CM | POA: Diagnosis not present

## 2020-03-12 DIAGNOSIS — E119 Type 2 diabetes mellitus without complications: Secondary | ICD-10-CM | POA: Diagnosis not present

## 2020-03-15 ENCOUNTER — Encounter: Payer: Self-pay | Admitting: Urology

## 2020-03-15 ENCOUNTER — Other Ambulatory Visit: Payer: Self-pay

## 2020-03-15 ENCOUNTER — Ambulatory Visit (INDEPENDENT_AMBULATORY_CARE_PROVIDER_SITE_OTHER): Payer: Medicare HMO | Admitting: Urology

## 2020-03-15 VITALS — Temp 97.9°F | Ht 73.0 in | Wt 265.0 lb

## 2020-03-15 DIAGNOSIS — R351 Nocturia: Secondary | ICD-10-CM | POA: Insufficient documentation

## 2020-03-15 DIAGNOSIS — N3281 Overactive bladder: Secondary | ICD-10-CM | POA: Diagnosis not present

## 2020-03-15 LAB — POCT URINALYSIS DIPSTICK
Bilirubin, UA: NEGATIVE
Glucose, UA: NEGATIVE
Ketones, UA: NEGATIVE
Leukocytes, UA: NEGATIVE
Nitrite, UA: NEGATIVE
Protein, UA: POSITIVE — AB
Spec Grav, UA: >=1.03 — AB (ref 1.010–1.025)
Urobilinogen, UA: 0.2 E.U./dL
pH, UA: 5 (ref 5.0–8.0)

## 2020-03-15 LAB — BLADDER SCAN AMB NON-IMAGING: Scan Result: 36.5

## 2020-03-15 MED ORDER — ALFUZOSIN HCL ER 10 MG PO TB24: 10.0000 mg | ORAL_TABLET | Freq: Every day | ORAL | 3 refills | Status: DC

## 2020-03-15 MED ORDER — TOLTERODINE TARTRATE ER 4 MG PO CP24: 4.0000 mg | ORAL_CAPSULE | Freq: Every day | ORAL | 2 refills | Status: DC

## 2020-03-15 NOTE — Patient Instructions (Signed)

## 2020-03-15 NOTE — Progress Notes (Signed)
03/15/2020 2:29 PM   Gregory Sarma Bibbee Sr. 1951-01-21 [id number]  Referring provider: Sharilyn Sites, MD [address] [place],  [place] [address]  Urge incontinence  HPI: Gregory Diaz is a 69yo here for followup for OAB. He was doing well for 2 weeks on toviaz 4mg  and then his urgency, frequency and nocturia worsened. He stopped his uroxtral 10mg  qhs. UA is not concerning for infection. NO dysuria and hematuria. No fevers/chills/sweats. PVR 36.5cc.   PMH: Past Medical History:  Diagnosis Date  . Abnormal SPEP 09/09/2015  . Anxiety   . Arthritis    DDD, low back  . Early cataracts, bilateral   . Enlarged prostate   . Heart murmur    was told at age 70, but has not had any problems  . Hypertension   . MGUS (monoclonal gammopathy of unknown significance) 01/29/2016  . Prostate cancer [facility])     Surgical History: Past Surgical History:  Procedure Laterality Date  . BACK SURGERY  2004, 2015  . COLONOSCOPY    . EYE SURGERY     L eye- as a child   . FRACTURE SURGERY Right    thumb  . HEMORROIDECTOMY    . HERNIA REPAIR     umbilical hernia repair  . MASS EXCISION Right 07/11/2016   Procedure: EXCISION 6CM RIGHT SHOULDER MASS;  Surgeon: Vickie Epley, MD;  Location: [facility];  Service: Vascular;  Laterality: Right;  . PROSTATE BIOPSY    . TONSILLECTOMY    . WOUND DEBRIDEMENT N/A 10/25/2018   Procedure: DEBRIDEMENT OF MUSCLE AND FASCIA AT UMBILICUS;  Surgeon: Virl Cagey, MD;  Location: [facility];  Service: General;  Laterality: N/A;    Home Medications:  Allergies as of 03/15/2020      Reactions   Penicillins Swelling, Other (See Comments)   Has patient had a PCN reaction causing immediate rash, facial/tongue/throat swelling, SOB or lightheadedness with hypotension: Yes Has patient had a PCN reaction causing severe rash involving mucus membranes or skin necrosis: No Has patient had a PCN reaction that required hospitalization No Has patient had a PCN reaction  occurring within the last 10 years: No If all of the above answers are "NO", then may proceed with Cephalosporin use.      Medication List       Accurate as of Mar 15, 2020  2:29 PM. If you have any questions, ask your nurse or doctor.        STOP taking these medications   doxycycline 100 MG capsule Commonly known as: VIBRAMYCIN Stopped by: Nicolette Bang, MD   fesoterodine 4 MG Tb24 tablet Commonly known as: TOVIAZ Stopped by: Nicolette Bang, MD   solifenacin 5 MG tablet Commonly known as: VESICARE Stopped by: Nicolette Bang, MD     TAKE these medications   alfuzosin 10 MG 24 hr tablet Commonly known as: UROXATRAL Take 1 tablet (10 mg total) by mouth at bedtime.   gabapentin 300 MG capsule Commonly known as: NEURONTIN Take 300 mg by mouth daily as needed (pain/neuropathy).   Januvia 100 MG tablet Generic drug: sitaGLIPtin Take 100 mg by mouth daily.   lisinopril 20 MG tablet Commonly known as: ZESTRIL   olmesartan 40 MG tablet Commonly known as: BENICAR Take 40 mg by mouth daily.   oxyCODONE-acetaminophen 5-325 MG tablet Commonly known as: PERCOCET/ROXICET One tab every 4-6 hours PRN   senna 8.6 MG tablet Commonly known as: SENOKOT Take 1-3 tablets by mouth daily as needed for constipation. Will  take up to 3 if needed   sulfamethoxazole-trimethoprim 800-160 MG tablet Commonly known as: BACTRIM DS Take 1 tablet by mouth every 12 (twelve) hours.   traZODone 50 MG tablet Commonly known as: DESYREL Take 50 mg by mouth at bedtime as needed for sleep.   valACYclovir 500 MG tablet Commonly known as: VALTREX Take 500 mg by mouth daily as needed (for fever blister/cold sores.).   Vitamin D (Ergocalciferol) 1.25 MG (50000 UNIT) Caps capsule Commonly known as: DRISDOL TAKE 1 CAPSULE BY MOUTH ONCE A WEEK FOR 12 WEEKS. AFTER THE 12TH PILL IS TAKEN THEN PT SHOULD START OVER THE COUNTER 2000 UNITS OF VITAMIN D       Allergies:  Allergies  Allergen  Reactions  . Penicillins Swelling and Other (See Comments)    Has patient had a PCN reaction causing immediate rash, facial/tongue/throat swelling, SOB or lightheadedness with hypotension: Yes Has patient had a PCN reaction causing severe rash involving mucus membranes or skin necrosis: No Has patient had a PCN reaction that required hospitalization No Has patient had a PCN reaction occurring within the last 10 years: No If all of the above answers are "NO", then may proceed with Cephalosporin use.     Family History: Family History  Problem Relation Age of Onset  . Gallstones Father   . Ulcers Father   . Alcohol abuse Father   . Diabetes Daughter   . Diabetes Brother   . Diabetes Sister   . Breast cancer Sister     Social History:  reports that he has been smoking cigarettes. He has a 25.00 pack-year smoking history. He has never used smokeless tobacco. He reports that he does not drink alcohol or use drugs.  ROS: All other review of systems were reviewed and are negative except what is noted above in HPI  Physical Exam: Temp 97.9 F (36.6 C)   Ht 6\' 1"  (1.854 m)   Wt 265 lb (120.2 kg)   BMI 34.96 kg/m   Constitutional:  Alert and oriented, No acute distress. HEENT: [place] AT, moist mucus membranes.  Trachea midline, no masses. Cardiovascular: No clubbing, cyanosis, or edema. Respiratory: Normal respiratory effort, no increased work of breathing. GI: Abdomen is soft, nontender, nondistended, no abdominal masses GU: No CVA tenderness.  Lymph: No cervical or inguinal lymphadenopathy. Skin: No rashes, bruises or suspicious lesions. Neurologic: Grossly intact, no focal deficits, moving all 4 extremities. Psychiatric: Normal mood and affect.  Laboratory Data: Lab Results  Component Value Date   WBC 4.5 10/08/2018   HGB 13.8 10/16/2018   HCT 43.4 10/16/2018   MCV 91.9 10/08/2018   PLT 219 10/08/2018    Lab Results  Component Value Date   CREATININE 1.26 (H) 10/08/2018      No results found for: PSA  No results found for: TESTOSTERONE  Lab Results  Component Value Date   HGBA1C 5.5 10/16/2018    Urinalysis    Component Value Date/Time   COLORURINE YELLOW 12/10/2019 0901   APPEARANCEUR CLEAR 12/10/2019 0901   LABSPEC 1.020 12/10/2019 0901   PHURINE 5.0 12/10/2019 0901   GLUCOSEU NEGATIVE 12/10/2019 0901   HGBUR SMALL (A) 12/10/2019 0901   BILIRUBINUR neg 03/15/2020 1408   KETONESUR NEGATIVE 12/10/2019 0901   PROTEINUR Positive (A) 03/15/2020 1408   PROTEINUR NEGATIVE 12/10/2019 0901   UROBILINOGEN 0.2 03/15/2020 1408   UROBILINOGEN 0.2 08/22/2015 1609   NITRITE neg 03/15/2020 1408   NITRITE NEGATIVE 12/10/2019 0901   LEUKOCYTESUR Negative 03/15/2020 1408  LEUKOCYTESUR NEGATIVE 12/10/2019 0901    Lab Results  Component Value Date   BACTERIA NONE SEEN 12/10/2019    Pertinent Imaging:  No results found for this or any previous visit. No results found for this or any previous visit. No results found for this or any previous visit. No results found for this or any previous visit. Results for orders placed during the hospital encounter of 08/22/15  US Renal   Narrative CLINICAL DATA:  Acute kidney injury  EXAM: RENAL / URINARY TRACT ULTRASOUND COMPLETE  COMPARISON:  None.  FINDINGS: Right Kidney:  Length: 15.6 cm. Numerous cysts in the upper and midpole all measuring less than or equal to 5 cm. No hydronephrosis. Normal echogenicity.  Left Kidney:  Length: 15.1 cm. Two lower pole cysts 1 measuring 4.4 cm in the other measuring 3.6 cm. No hydronephrosis. Normal echogenicity.  Bladder:  Appears normal for degree of bladder distention.  IMPRESSION: Bilateral simple cysts.  No acute abnormalities.   Electronically Signed   By: Skipper Cliche M.D.   On: 08/24/2015 08:28    No results found for this or any previous visit. No results found for this or any previous visit. No results found for this or any previous  visit.  Assessment & Plan:    1. OAB (overactive bladder) -we will trial detrol LA 4mg  - POCT urinalysis dipstick - BLADDER SCAN AMB NON-IMAGING  2. Nocturia:  -restart uroxtral 10mg  qhs -RTC 6 weeks   No follow-ups on file.  Nicolette Bang, MD  [facility] [place]

## 2020-03-15 NOTE — Progress Notes (Signed)
Urological Symptom Review  Patient is experiencing the following symptoms: Get up at night to urinate Leakage of urine Blood in urine Weak stream   Review of Systems  Gastrointestinal (upper)  : Negative for upper GI symptoms  Gastrointestinal (lower) : Negative for lower GI symptoms  Constitutional : Negative for symptoms  Skin: Negative for skin symptoms  Eyes: Negative for eye symptoms  Ear/Nose/Throat : Negative for Ear/Nose/Throat symptoms  Hematologic/Lymphatic: Negative for Hematologic/Lymphatic symptoms  Cardiovascular : Negative for cardiovascular symptoms  Respiratory : Negative for respiratory symptoms  Endocrine: Negative for endocrine symptoms  Musculoskeletal: Joint pain  Neurological: Dizziness  Psychologic: Negative for psychiatric symptoms

## 2020-03-30 DIAGNOSIS — N182 Chronic kidney disease, stage 2 (mild): Secondary | ICD-10-CM | POA: Diagnosis not present

## 2020-04-07 ENCOUNTER — Ambulatory Visit: Payer: Medicare HMO | Admitting: Urology

## 2020-04-07 DIAGNOSIS — E6609 Other obesity due to excess calories: Secondary | ICD-10-CM | POA: Diagnosis not present

## 2020-04-07 DIAGNOSIS — Z6836 Body mass index (BMI) 36.0-36.9, adult: Secondary | ICD-10-CM | POA: Diagnosis not present

## 2020-04-07 DIAGNOSIS — G894 Chronic pain syndrome: Secondary | ICD-10-CM | POA: Diagnosis not present

## 2020-04-08 DIAGNOSIS — L02229 Furuncle of trunk, unspecified: Secondary | ICD-10-CM | POA: Diagnosis not present

## 2020-04-08 DIAGNOSIS — B9689 Other specified bacterial agents as the cause of diseases classified elsewhere: Secondary | ICD-10-CM | POA: Diagnosis not present

## 2020-04-09 DIAGNOSIS — I10 Essential (primary) hypertension: Secondary | ICD-10-CM | POA: Diagnosis not present

## 2020-04-09 DIAGNOSIS — E119 Type 2 diabetes mellitus without complications: Secondary | ICD-10-CM | POA: Diagnosis not present

## 2020-04-26 ENCOUNTER — Ambulatory Visit: Payer: Medicare HMO | Admitting: Urology

## 2020-04-26 DIAGNOSIS — M79675 Pain in left toe(s): Secondary | ICD-10-CM | POA: Diagnosis not present

## 2020-04-26 DIAGNOSIS — B351 Tinea unguium: Secondary | ICD-10-CM | POA: Diagnosis not present

## 2020-05-06 DIAGNOSIS — L720 Epidermal cyst: Secondary | ICD-10-CM | POA: Diagnosis not present

## 2020-05-12 ENCOUNTER — Ambulatory Visit (INDEPENDENT_AMBULATORY_CARE_PROVIDER_SITE_OTHER): Payer: Medicare HMO | Admitting: Urology

## 2020-05-12 ENCOUNTER — Encounter: Payer: Self-pay | Admitting: Urology

## 2020-05-12 ENCOUNTER — Other Ambulatory Visit: Payer: Self-pay

## 2020-05-12 VITALS — BP 145/75 | HR 85 | Temp 97.5°F | Ht 74.0 in | Wt 265.0 lb

## 2020-05-12 DIAGNOSIS — R351 Nocturia: Secondary | ICD-10-CM

## 2020-05-12 DIAGNOSIS — I10 Essential (primary) hypertension: Secondary | ICD-10-CM | POA: Diagnosis not present

## 2020-05-12 DIAGNOSIS — R3129 Other microscopic hematuria: Secondary | ICD-10-CM

## 2020-05-12 DIAGNOSIS — N3281 Overactive bladder: Secondary | ICD-10-CM

## 2020-05-12 DIAGNOSIS — E119 Type 2 diabetes mellitus without complications: Secondary | ICD-10-CM | POA: Diagnosis not present

## 2020-05-12 LAB — POCT URINALYSIS DIPSTICK
Bilirubin, UA: NEGATIVE
Glucose, UA: NEGATIVE
Ketones, UA: NEGATIVE
Leukocytes, UA: NEGATIVE
Nitrite, UA: NEGATIVE
Protein, UA: NEGATIVE
Spec Grav, UA: 1.02 (ref 1.010–1.025)
Urobilinogen, UA: 0.2 E.U./dL
pH, UA: 6 (ref 5.0–8.0)

## 2020-05-12 LAB — BLADDER SCAN AMB NON-IMAGING: Scan Result: 46.5

## 2020-05-12 MED ORDER — ALFUZOSIN HCL ER 10 MG PO TB24: 10.0000 mg | ORAL_TABLET | Freq: Two times a day (BID) | ORAL | 3 refills | Status: DC

## 2020-05-12 MED ORDER — TOLTERODINE TARTRATE ER 4 MG PO CP24: 4.0000 mg | ORAL_CAPSULE | Freq: Every day | ORAL | 2 refills | Status: DC

## 2020-05-12 MED ORDER — SULFAMETHOXAZOLE-TRIMETHOPRIM 800-160 MG PO TABS: 1.0000 | ORAL_TABLET | Freq: Two times a day (BID) | ORAL | 0 refills | Status: DC

## 2020-05-12 NOTE — Patient Instructions (Signed)

## 2020-05-12 NOTE — Progress Notes (Signed)
Urological Symptom Review  Patient is experiencing the following symptoms: none   Review of Systems  Gastrointestinal (upper)  : Negative for upper GI symptoms  Gastrointestinal (lower) : Negative for lower GI symptoms  Constitutional : Negative for symptoms  Skin: Negative for skin symptoms  Eyes: Negative for eye symptoms  Ear/Nose/Throat : Negative for Ear/Nose/Throat symptoms  Hematologic/Lymphatic: Negative for Hematologic/Lymphatic symptoms  Cardiovascular : Negative for cardiovascular symptoms  Respiratory : Negative for respiratory symptoms  Endocrine: Negative for endocrine symptoms  Musculoskeletal: Back pain Joint pain  Neurological: Headaches  Psychologic: Negative for psychiatric symptoms

## 2020-05-12 NOTE — Progress Notes (Signed)
05/12/2020 9:42 AM   Gregory Faster Schrum Sr. 1951/01/10 [id number]  Referring provider: Sharilyn Sites, MD [address] [place],  [place] [address]  Nocturia  HPI: Mr Bayless is a 69yo here for followup for OAB and nocturia. Last visit he restarted uroxatral 10mg  qhs and he was started on detrol. UA today is normal. PVR is 46cc. He is taking uroxatral but the patient is unsure whether he is taking detrol. He continues to have urinary urgency, frequency every 30-90 minutes, daily urge incontinence.    PMH: Past Medical History:  Diagnosis Date  . Abnormal SPEP 09/09/2015  . Anxiety   . Arthritis    DDD, low back  . Early cataracts, bilateral   . Enlarged prostate   . Heart murmur    was told at age 63, but has not had any problems  . Hypertension   . MGUS (monoclonal gammopathy of unknown significance) 01/29/2016  . Prostate cancer [facility])     Surgical History: Past Surgical History:  Procedure Laterality Date  . BACK SURGERY  2004, 2015  . COLONOSCOPY    . EYE SURGERY     L eye- as a child   . FRACTURE SURGERY Right    thumb  . HEMORROIDECTOMY    . HERNIA REPAIR     umbilical hernia repair  . MASS EXCISION Right 07/11/2016   Procedure: EXCISION 6CM RIGHT SHOULDER MASS;  Surgeon: Vickie Epley, MD;  Location: [facility];  Service: Vascular;  Laterality: Right;  . PROSTATE BIOPSY    . TONSILLECTOMY    . WOUND DEBRIDEMENT N/A 10/25/2018   Procedure: DEBRIDEMENT OF MUSCLE AND FASCIA AT UMBILICUS;  Surgeon: Virl Cagey, MD;  Location: [facility];  Service: General;  Laterality: N/A;    Home Medications:  Allergies as of 05/12/2020      Reactions   Penicillins Swelling, Other (See Comments)   Has patient had a PCN reaction causing immediate rash, facial/tongue/throat swelling, SOB or lightheadedness with hypotension: Yes Has patient had a PCN reaction causing severe rash involving mucus membranes or skin necrosis: No Has patient had a PCN reaction that required  hospitalization No Has patient had a PCN reaction occurring within the last 10 years: No If all of the above answers are "NO", then may proceed with Cephalosporin use.      Medication List       Accurate as of May 12, 2020  9:42 AM. If you have any questions, ask your nurse or doctor.        alfuzosin 10 MG 24 hr tablet Commonly known as: UROXATRAL Take 1 tablet (10 mg total) by mouth at bedtime.   ciclopirox 8 % solution Commonly known as: PENLAC Apply topically daily.   gabapentin 300 MG capsule Commonly known as: NEURONTIN Take 300 mg by mouth daily as needed (pain/neuropathy).   Januvia 100 MG tablet Generic drug: sitaGLIPtin Take 100 mg by mouth daily.   lisinopril 20 MG tablet Commonly known as: ZESTRIL   olmesartan 40 MG tablet Commonly known as: BENICAR Take 40 mg by mouth daily.   oxyCODONE-acetaminophen 5-325 MG tablet Commonly known as: PERCOCET/ROXICET One tab every 4-6 hours PRN   senna 8.6 MG tablet Commonly known as: SENOKOT Take 1-3 tablets by mouth daily as needed for constipation. Will take up to 3 if needed   sulfamethoxazole-trimethoprim 800-160 MG tablet Commonly known as: BACTRIM DS Take 1 tablet by mouth every 12 (twelve) hours.   tolterodine 4 MG 24 hr capsule Commonly  known as: Detrol LA Take 1 capsule (4 mg total) by mouth daily.   traZODone 50 MG tablet Commonly known as: DESYREL Take 50 mg by mouth at bedtime as needed for sleep.   valACYclovir 500 MG tablet Commonly known as: VALTREX Take 500 mg by mouth daily as needed (for fever blister/cold sores.).   Vitamin D (Ergocalciferol) 1.25 MG (50000 UNIT) Caps capsule Commonly known as: DRISDOL TAKE 1 CAPSULE BY MOUTH ONCE A WEEK FOR 12 WEEKS. AFTER THE 12TH PILL IS TAKEN THEN PT SHOULD START OVER THE COUNTER 2000 UNITS OF VITAMIN D   zolpidem 10 MG tablet Commonly known as: AMBIEN Take 5-10 mg by mouth at bedtime.       Allergies:  Allergies  Allergen Reactions  .  Penicillins Swelling and Other (See Comments)    Has patient had a PCN reaction causing immediate rash, facial/tongue/throat swelling, SOB or lightheadedness with hypotension: Yes Has patient had a PCN reaction causing severe rash involving mucus membranes or skin necrosis: No Has patient had a PCN reaction that required hospitalization No Has patient had a PCN reaction occurring within the last 10 years: No If all of the above answers are "NO", then may proceed with Cephalosporin use.     Family History: Family History  Problem Relation Age of Onset  . Gallstones Father   . Ulcers Father   . Alcohol abuse Father   . Diabetes Daughter   . Diabetes Brother   . Diabetes Sister   . Breast cancer Sister     Social History:  reports that he has been smoking cigarettes. He has a 25.00 pack-year smoking history. He has never used smokeless tobacco. He reports that he does not drink alcohol and does not use drugs.  ROS: All other review of systems were reviewed and are negative except what is noted above in HPI  Physical Exam: BP (!) 145/75   Pulse 85   Temp (!) 97.5 F (36.4 C)   Ht 6\' 2"  (1.88 m)   Wt 265 lb (120.2 kg)   BMI 34.02 kg/m   Constitutional:  Alert and oriented, No acute distress. HEENT: [place] AT, moist mucus membranes.  Trachea midline, no masses. Cardiovascular: No clubbing, cyanosis, or edema. Respiratory: Normal respiratory effort, no increased work of breathing. GI: Abdomen is soft, nontender, nondistended, no abdominal masses GU: No CVA tenderness.  Lymph: No cervical or inguinal lymphadenopathy. Skin: No rashes, bruises or suspicious lesions. Neurologic: Grossly intact, no focal deficits, moving all 4 extremities. Psychiatric: Normal mood and affect.  Laboratory Data: Lab Results  Component Value Date   WBC 4.5 10/08/2018   HGB 13.8 10/16/2018   HCT 43.4 10/16/2018   MCV 91.9 10/08/2018   PLT 219 10/08/2018    Lab Results  Component Value Date    CREATININE 1.26 (H) 10/08/2018    No results found for: PSA  No results found for: TESTOSTERONE  Lab Results  Component Value Date   HGBA1C 5.5 10/16/2018    Urinalysis    Component Value Date/Time   COLORURINE YELLOW 12/10/2019 0901   APPEARANCEUR CLEAR 12/10/2019 0901   LABSPEC 1.020 12/10/2019 0901   PHURINE 5.0 12/10/2019 0901   GLUCOSEU NEGATIVE 12/10/2019 0901   HGBUR SMALL (A) 12/10/2019 0901   BILIRUBINUR neg 05/12/2020 0917   KETONESUR NEGATIVE 12/10/2019 0901   PROTEINUR Negative 05/12/2020 0917   PROTEINUR NEGATIVE 12/10/2019 0901   UROBILINOGEN 0.2 05/12/2020 0917   UROBILINOGEN 0.2 08/22/2015 1609   NITRITE neg 05/12/2020 8466  NITRITE NEGATIVE 12/10/2019 0901   LEUKOCYTESUR Negative 05/12/2020 0917   LEUKOCYTESUR NEGATIVE 12/10/2019 0901    Lab Results  Component Value Date   BACTERIA NONE SEEN 12/10/2019    Pertinent Imaging:  No results found for this or any previous visit.  No results found for this or any previous visit.  No results found for this or any previous visit.  No results found for this or any previous visit.  Results for orders placed during the hospital encounter of 08/22/15  US Renal  Narrative CLINICAL DATA:  Acute kidney injury  EXAM: RENAL / URINARY TRACT ULTRASOUND COMPLETE  COMPARISON:  None.  FINDINGS: Right Kidney:  Length: 15.6 cm. Numerous cysts in the upper and midpole all measuring less than or equal to 5 cm. No hydronephrosis. Normal echogenicity.  Left Kidney:  Length: 15.1 cm. Two lower pole cysts 1 measuring 4.4 cm in the other measuring 3.6 cm. No hydronephrosis. Normal echogenicity.  Bladder:  Appears normal for degree of bladder distention.  IMPRESSION: Bilateral simple cysts.  No acute abnormalities.   Electronically Signed By: Skipper Cliche M.D. On: 08/24/2015 08:28  No results found for this or any previous visit.  No results found for this or any previous visit.  No results  found for this or any previous visit.   Assessment & Plan:    1. OAB (overactive bladder) -patient to confirm he is on detrol and will call our office - POCT urinalysis dipstick - BLADDER SCAN AMB NON-IMAGING  2. Nocturia -increase uroxatral to 10mg  BID    No follow-ups on file.  Nicolette Bang, MD  [facility] [place]

## 2020-05-23 ENCOUNTER — Emergency Department (HOSPITAL_COMMUNITY)
Admission: EM | Admit: 2020-05-23 | Discharge: 2020-05-23 | Disposition: A | Discharge status: Home / Self Care | Payer: Medicare HMO | Attending: Emergency Medicine | Admitting: Emergency Medicine

## 2020-05-23 ENCOUNTER — Encounter (HOSPITAL_COMMUNITY): Payer: Self-pay | Admitting: Emergency Medicine

## 2020-05-23 ENCOUNTER — Other Ambulatory Visit: Payer: Self-pay

## 2020-05-23 ENCOUNTER — Emergency Department (HOSPITAL_COMMUNITY): Payer: Medicare HMO

## 2020-05-23 DIAGNOSIS — I700 Atherosclerosis of aorta: Secondary | ICD-10-CM | POA: Diagnosis not present

## 2020-05-23 DIAGNOSIS — D3500 Benign neoplasm of unspecified adrenal gland: Secondary | ICD-10-CM | POA: Diagnosis not present

## 2020-05-23 DIAGNOSIS — I110 Hypertensive heart disease with heart failure: Secondary | ICD-10-CM | POA: Diagnosis not present

## 2020-05-23 DIAGNOSIS — R300 Dysuria: Secondary | ICD-10-CM | POA: Diagnosis not present

## 2020-05-23 DIAGNOSIS — R1030 Lower abdominal pain, unspecified: Secondary | ICD-10-CM | POA: Insufficient documentation

## 2020-05-23 DIAGNOSIS — F17210 Nicotine dependence, cigarettes, uncomplicated: Secondary | ICD-10-CM | POA: Diagnosis not present

## 2020-05-23 DIAGNOSIS — N3289 Other specified disorders of bladder: Secondary | ICD-10-CM | POA: Diagnosis not present

## 2020-05-23 DIAGNOSIS — I509 Heart failure, unspecified: Secondary | ICD-10-CM | POA: Diagnosis not present

## 2020-05-23 DIAGNOSIS — R350 Frequency of micturition: Secondary | ICD-10-CM | POA: Diagnosis not present

## 2020-05-23 DIAGNOSIS — K5730 Diverticulosis of large intestine without perforation or abscess without bleeding: Secondary | ICD-10-CM | POA: Diagnosis not present

## 2020-05-23 DIAGNOSIS — R69 Illness, unspecified: Secondary | ICD-10-CM | POA: Diagnosis not present

## 2020-05-23 DIAGNOSIS — R3915 Urgency of urination: Secondary | ICD-10-CM | POA: Diagnosis present

## 2020-05-23 HISTORY — DX: Disorder of kidney and ureter, unspecified: N28.9

## 2020-05-23 LAB — URINALYSIS, ROUTINE W REFLEX MICROSCOPIC
Bacteria, UA: NONE SEEN
Bilirubin Urine: NEGATIVE
Glucose, UA: NEGATIVE mg/dL
Hgb urine dipstick: NEGATIVE
Ketones, ur: NEGATIVE mg/dL
Leukocytes,Ua: NEGATIVE
Nitrite: NEGATIVE
Protein, ur: 100 mg/dL — AB
Specific Gravity, Urine: 1.029 (ref 1.005–1.030)
pH: 5 (ref 5.0–8.0)

## 2020-05-23 LAB — CBC WITH DIFFERENTIAL/PLATELET
Abs Immature Granulocytes: 0.03 10*3/uL (ref 0.00–0.07)
Basophils Absolute: 0 10*3/uL (ref 0.0–0.1)
Basophils Relative: 0 %
Eosinophils Absolute: 0.1 10*3/uL (ref 0.0–0.5)
Eosinophils Relative: 1 %
HCT: 47.9 % (ref 39.0–52.0)
Hemoglobin: 15.2 g/dL (ref 13.0–17.0)
Immature Granulocytes: 0 %
Lymphocytes Relative: 16 %
Lymphs Abs: 1.4 10*3/uL (ref 0.7–4.0)
MCH: 27.4 pg (ref 26.0–34.0)
MCHC: 31.7 g/dL (ref 30.0–36.0)
MCV: 86.5 fL (ref 80.0–100.0)
Monocytes Absolute: 1.1 10*3/uL — ABNORMAL HIGH (ref 0.1–1.0)
Monocytes Relative: 12 %
Neutro Abs: 6.1 10*3/uL (ref 1.7–7.7)
Neutrophils Relative %: 71 %
Platelets: 237 10*3/uL (ref 150–400)
RBC: 5.54 MIL/uL (ref 4.22–5.81)
RDW: 12.7 % (ref 11.5–15.5)
WBC: 8.7 10*3/uL (ref 4.0–10.5)
nRBC: 0 % (ref 0.0–0.2)

## 2020-05-23 LAB — COMPREHENSIVE METABOLIC PANEL
ALT: 15 U/L (ref 0–44)
AST: 20 U/L (ref 15–41)
Albumin: 4.2 g/dL (ref 3.5–5.0)
Alkaline Phosphatase: 77 U/L (ref 38–126)
Anion gap: 7 (ref 5–15)
BUN: 16 mg/dL (ref 8–23)
CO2: 25 mmol/L (ref 22–32)
Calcium: 8.7 mg/dL — ABNORMAL LOW (ref 8.9–10.3)
Chloride: 104 mmol/L (ref 98–111)
Creatinine, Ser: 1.29 mg/dL — ABNORMAL HIGH (ref 0.61–1.24)
GFR calc Af Amer: >60 mL/min (ref >60)
GFR calc non Af Amer: 55 mL/min — ABNORMAL LOW (ref >60)
Glucose, Bld: 97 mg/dL (ref 70–99)
Potassium: 3.7 mmol/L (ref 3.5–5.1)
Sodium: 136 mmol/L (ref 135–145)
Total Bilirubin: 1.4 mg/dL — ABNORMAL HIGH (ref 0.3–1.2)
Total Protein: 7.9 g/dL (ref 6.5–8.1)

## 2020-05-23 LAB — LIPASE, BLOOD: Lipase: 35 U/L (ref 11–51)

## 2020-05-23 MED ORDER — IOHEXOL 350 MG/ML SOLN: 80.0000 mL | Freq: Once | INTRAVENOUS | Status: DC | PRN

## 2020-05-23 MED ORDER — IOHEXOL 300 MG/ML  SOLN
100.0000 mL | Freq: Once | INTRAMUSCULAR | Status: AC | PRN
  Administered 2020-05-23: 100 mL via INTRAVENOUS

## 2020-05-23 MED ORDER — HYDROCODONE-ACETAMINOPHEN 5-325 MG PO TABS
1.0000 | ORAL_TABLET | Freq: Once | ORAL | Status: AC
  Administered 2020-05-23: 1 via ORAL
  Filled 2020-05-23: qty 1

## 2020-05-23 MED ORDER — IOHEXOL 300 MG/ML  SOLN: 100.0000 mL | Freq: Once | INTRAMUSCULAR | Status: DC | PRN

## 2020-05-23 NOTE — ED Triage Notes (Signed)
Patient c/o dysuria that started Wednesday and is progressively getting worse. Per patient has feeling of urgency but only voids small amounts at a time. Denies any hematuria, discharge, fevers, nausea, or vomiting.

## 2020-05-23 NOTE — Discharge Instructions (Addendum)
Your work-up this evening does not show evidence of infection to your lower abdomen or infection of your urine.  We have cultured your urine and the results typically takes 2 to 3 days.  I recommend that you call Dr. Noland Fordyce office to arrange a follow-up appointment for this week.  Continue to drink plenty of water.

## 2020-05-23 NOTE — ED Notes (Signed)
From CT 

## 2020-05-25 ENCOUNTER — Telehealth: Payer: Self-pay | Admitting: Urology

## 2020-05-25 DIAGNOSIS — N182 Chronic kidney disease, stage 2 (mild): Secondary | ICD-10-CM | POA: Diagnosis not present

## 2020-05-25 LAB — URINE CULTURE: Culture: NO GROWTH

## 2020-05-25 NOTE — Telephone Encounter (Signed)
Called back. Left message to return call

## 2020-05-25 NOTE — Telephone Encounter (Signed)
Pt states to have more issues with his prostate. He would like a call back or to be seen asap.

## 2020-05-26 ENCOUNTER — Encounter: Payer: Self-pay | Admitting: Urology

## 2020-05-26 ENCOUNTER — Ambulatory Visit (INDEPENDENT_AMBULATORY_CARE_PROVIDER_SITE_OTHER): Payer: Medicare HMO | Admitting: Urology

## 2020-05-26 ENCOUNTER — Other Ambulatory Visit: Payer: Self-pay

## 2020-05-26 VITALS — BP 135/68 | HR 36 | Temp 97.9°F | Ht 74.0 in | Wt 265.0 lb

## 2020-05-26 DIAGNOSIS — R300 Dysuria: Secondary | ICD-10-CM | POA: Diagnosis not present

## 2020-05-26 MED ORDER — CIPROFLOXACIN HCL 500 MG PO TABS
500.0000 mg | ORAL_TABLET | Freq: Once | ORAL | Status: AC
  Administered 2020-05-26: 500 mg via ORAL

## 2020-05-26 MED ORDER — DOXYCYCLINE HYCLATE 100 MG PO CAPS
100.0000 mg | ORAL_CAPSULE | Freq: Two times a day (BID) | ORAL | 0 refills | Status: DC
Start: 2020-05-26 — End: 2022-03-07

## 2020-05-26 NOTE — Patient Instructions (Signed)
Prostatitis  Prostatitis is swelling of the prostate gland. The prostate helps to make semen. It is below a man's bladder, in front of the rectum. There are different types of prostatitis. Follow these instructions at home:   Take over-the-counter and prescription medicines only as told by your doctor.  If you were prescribed an antibiotic medicine, take it as told by your doctor. Do not stop taking the antibiotic even if you start to feel better.  If your doctor prescribed exercises, do them as directed.  Take sitz baths as told by your doctor. To take a sitz bath, sit in warm water that is deep enough to cover your hips and butt.  Keep all follow-up visits as told by your doctor. This is important. Contact a doctor if:  Your symptoms get worse.  You have a fever. Get help right away if:  You have chills.  You feel sick to your stomach (nauseous).  You throw up (vomit).  You feel light-headed.  You feel like you might pass out (faint).  You cannot pee (urinate).  You have blood or clumps of blood (blood clots) in your pee (urine). This information is not intended to replace advice given to you by your health care provider. Make sure you discuss any questions you have with your health care provider. Document Revised: 10/12/2017 Document Reviewed: 07/20/2016 Elsevier Patient Education  2020 Elsevier Inc.  

## 2020-05-26 NOTE — ED Provider Notes (Signed)
[facility] Provider Note   CSN: [id number] Arrival date & time: 05/23/20  1758     History Chief Complaint  Patient presents with  . Dysuria    Gregory A Bunner Sr. is a 69 y.o. male.  HPI      Gregory Baty Trotti Sr. is a 69 y.o. male with past medical history of anxiety, enlarged prostate, hypertension, prostate cancer who presents to the Emergency Department complaining of dysuria for several days.  He describes feeling of urgency to urinate and only voiding small amounts.  He states that on occasion he has the urge to urinate, but defecates instead.  He states this is been going on for some time.  He was recently seen by his urologist and started on medication for overactive bladder, but he states this is not helping.  He denies swelling or pain of his genitals, abdominal pain, fever, chills, nausea or vomiting, presence of blood in his urine.    Past Medical History:  Diagnosis Date  . Abnormal SPEP 09/09/2015  . Anxiety   . Arthritis    DDD, low back  . Early cataracts, bilateral   . Enlarged prostate   . Heart murmur    was told at age 89, but has not had any problems  . Hypertension   . MGUS (monoclonal gammopathy of unknown significance) 01/29/2016  . Prostate cancer ([place])   . Renal disorder     Patient Active Problem List   Diagnosis Date Noted  . Nocturia 03/15/2020  . Urinary incontinence 01/28/2020  . Benign prostatic hyperplasia with urinary obstruction 01/28/2020  . OAB (overactive bladder) 01/28/2020  . Acute cystitis without hematuria 01/28/2020  . Chronic wound infection of abdomen   . Suture granuloma 09/25/2018  . Malignant neoplasm of prostate ([place]) 06/01/2018  . MGUS (monoclonal gammopathy of unknown significance) 01/29/2016  . Acute diastolic CHF (congestive heart failure) ([place]) 08/27/2015  . AKI (acute kidney injury) ([place])   . Nephrotic syndrome   . Heart failure with acute decompensation, type unknown ([place]) 08/22/2015  .  Acute CHF (congestive heart failure) ([place]) 08/22/2015  . HNP (herniated nucleus pulposus), lumbar 05/31/2015  . Spinal stenosis at L4-L5 level 10/19/2014  . Radiculopathy of leg 10/23/2012  . Hypertension 03/31/2012    Past Surgical History:  Procedure Laterality Date  . BACK SURGERY  2004, 2015  . COLONOSCOPY    . EYE SURGERY     L eye- as a child   . FRACTURE SURGERY Right    thumb  . HEMORROIDECTOMY    . HERNIA REPAIR     umbilical hernia repair  . MASS EXCISION Right 07/11/2016   Procedure: EXCISION 6CM RIGHT SHOULDER MASS;  Surgeon: Vickie Epley, MD;  Location: [facility];  Service: Vascular;  Laterality: Right;  . PROSTATE BIOPSY    . TONSILLECTOMY    . WOUND DEBRIDEMENT N/A 10/25/2018   Procedure: DEBRIDEMENT OF MUSCLE AND FASCIA AT UMBILICUS;  Surgeon: Virl Cagey, MD;  Location: [facility];  Service: General;  Laterality: N/A;       Family History  Problem Relation Age of Onset  . Gallstones Father   . Ulcers Father   . Alcohol abuse Father   . Diabetes Daughter   . Diabetes Brother   . Diabetes Sister   . Breast cancer Sister     Social History   Tobacco Use  . Smoking status: Current Every Day Smoker    Packs/day: 0.50    Years: 50.00  Pack years: 25.00    Types: Cigarettes  . Smokeless tobacco: Never Used  Vaping Use  . Vaping Use: Never used  Substance Use Topics  . Alcohol use: No  . Drug use: No    Home Medications Prior to Admission medications   Medication Sig Start Date End Date Taking? Authorizing Provider  alfuzosin (UROXATRAL) 10 MG 24 hr tablet Take 1 tablet (10 mg total) by mouth in the morning and at bedtime. 05/12/20   McKenzie, Candee Furbish, MD  ciclopirox (PENLAC) 8 % solution Apply topically daily. 04/26/20   [provider]  doxycycline (VIBRAMYCIN) 100 MG capsule Take 1 capsule (100 mg total) by mouth every 12 (twelve) hours. 05/26/20   McKenzie, Candee Furbish, MD  gabapentin (NEURONTIN) 300 MG capsule Take 300 mg by mouth  daily as needed (pain/neuropathy).  01/15/18   [provider]  JANUVIA 100 MG tablet Take 100 mg by mouth daily. 08/07/19   [provider]  lisinopril (ZESTRIL) 20 MG tablet  08/25/19   [provider]  olmesartan (BENICAR) 40 MG tablet Take 40 mg by mouth daily. Patient not taking: Reported on 05/26/2020 10/08/18   [provider]  oxyCODONE-acetaminophen (PERCOCET/ROXICET) 5-325 MG tablet One tab every 4-6 hours PRN 01/09/19   [provider]  senna (SENOKOT) 8.6 MG tablet Take 1-3 tablets by mouth daily as needed for constipation. Will take up to 3 if needed     [provider]  sulfamethoxazole-trimethoprim (BACTRIM DS) 800-160 MG tablet Take 1 tablet by mouth 2 (two) times daily. 05/12/20   McKenzie, Candee Furbish, MD  tolterodine (DETROL LA) 4 MG 24 hr capsule Take 1 capsule (4 mg total) by mouth daily. 05/12/20   McKenzie, Candee Furbish, MD  traZODone (DESYREL) 50 MG tablet Take 50 mg by mouth at bedtime as needed for sleep.  03/13/18   [provider]  valACYclovir (VALTREX) 500 MG tablet Take 500 mg by mouth daily as needed (for fever blister/cold sores.).  04/29/18   [provider]  Vitamin D, Ergocalciferol, (DRISDOL) 1.25 MG (50000 UNIT) CAPS capsule TAKE 1 CAPSULE BY MOUTH ONCE A WEEK FOR 12 WEEKS. AFTER THE 12TH PILL IS TAKEN THEN PT SHOULD START OVER THE COUNTER 2000 UNITS OF VITAMIN D 03/11/20   [provider]  zolpidem (AMBIEN) 10 MG tablet Take 5-10 mg by mouth at bedtime. 04/24/20   [provider]    Allergies    Penicillins  Review of Systems   Review of Systems  Constitutional: Negative for activity change, appetite change, chills and fever.  Respiratory: Negative for chest tightness and shortness of breath.   Cardiovascular: Negative for chest pain.  Gastrointestinal: Negative for abdominal pain, diarrhea, nausea and vomiting.  Genitourinary: Positive for dysuria, frequency and urgency. Negative  for decreased urine volume, difficulty urinating, discharge, flank pain, hematuria, penile pain, penile swelling, scrotal swelling and testicular pain.  Musculoskeletal: Negative for back pain.  Skin: Negative for rash.  Neurological: Negative for dizziness, weakness and numbness.  Hematological: Negative for adenopathy.  Psychiatric/Behavioral: Negative for confusion.    Physical Exam Updated Vital Signs BP (!) 144/85   Pulse (!) 59   Temp 98.5 F (36.9 C)   Resp 18   Ht 6\' 2"  (1.88 m)   Wt 120.2 kg   SpO2 98%   BMI 34.02 kg/m   Physical Exam Vitals and nursing note reviewed.  Constitutional:      Appearance: Normal appearance. He is not ill-appearing or toxic-appearing.  HENT:     Mouth/Throat:     Mouth: Mucous membranes are moist.  Cardiovascular:     Rate and Rhythm: Normal rate and regular rhythm.     Pulses: Normal pulses.  Pulmonary:     Effort: Pulmonary effort is normal.     Breath sounds: Normal breath sounds.  Abdominal:     General: There is no distension.     Palpations: Abdomen is soft.     Tenderness: There is no abdominal tenderness. There is no guarding.  Musculoskeletal:     Right lower leg: No edema.     Left lower leg: No edema.  Skin:    General: Skin is warm.     Capillary Refill: Capillary refill takes less than 2 seconds.  Neurological:     General: No focal deficit present.     Mental Status: He is alert.     Motor: No weakness.     ED Results / Procedures / Treatments   Labs (all labs ordered are listed, but only abnormal results are displayed) Labs Reviewed  URINALYSIS, ROUTINE W REFLEX MICROSCOPIC - Abnormal; Notable for the following components:      Result Value   Color, Urine AMBER (*)    Protein, ur 100 (*)    All other components within normal limits  COMPREHENSIVE METABOLIC PANEL - Abnormal; Notable for the following components:   Creatinine, Ser 1.29 (*)    Calcium 8.7 (*)    Total Bilirubin 1.4 (*)    GFR calc non Af  Amer 55 (*)    All other components within normal limits  CBC WITH DIFFERENTIAL/PLATELET - Abnormal; Notable for the following components:   Monocytes Absolute 1.1 (*)    All other components within normal limits  URINE CULTURE  LIPASE, BLOOD    EKG None  Radiology CT ABDOMEN PELVIS W CONTRAST  Result Date: 05/23/2020 CLINICAL DATA:  Lower abdominal pain.  History of prostate cancer. EXAM: CT ABDOMEN AND PELVIS WITH CONTRAST TECHNIQUE: Multidetector CT imaging of the abdomen and pelvis was performed using the standard protocol following bolus administration of intravenous contrast. CONTRAST:  172mL OMNIPAQUE IOHEXOL 300 MG/ML  SOLN COMPARISON:  CT dated October 21, 2018 FINDINGS: Lower chest: The lung bases are clear. The heart is enlarged. Hepatobiliary: The liver is normal. Cholelithiasis without acute inflammation.There is no biliary ductal dilation. Pancreas: Normal contours without ductal dilatation. No peripancreatic fluid collection. Spleen: Unremarkable. Adrenals/Urinary Tract: --Adrenal glands: There is a stable left adrenal adenoma. --Right kidney/ureter: Multiple simple cysts are noted involving the right kidney. --Left kidney/ureter: There multiple simple cysts involving the left kidney. --Urinary bladder: There is diffuse wall thickening of the urinary bladder with adjacent fat stranding. Stomach/Bowel: --Stomach/Duodenum: No hiatal hernia or other gastric abnormality. Normal duodenal course and caliber. --Small bowel: Unremarkable. --Colon: There is scattered colonic diverticula without CT evidence for diverticulitis. --Appendix: Normal. Vascular/Lymphatic: Atherosclerotic calcification is present within the non-aneurysmal abdominal aorta, without hemodynamically significant stenosis. --No retroperitoneal lymphadenopathy. --No mesenteric lymphadenopathy. --No pelvic or inguinal lymphadenopathy. Reproductive: Prostate gland is diffusely enlarged. Multiple brachytherapy beads are noted.  Other: No ascites or free air. The abdominal wall is normal. Musculoskeletal. Extensive fusion hardware is noted throughout the lumbar spine. The patient is status post prior multilevel laminectomy. There are few subcutaneous hyperdense nodules in the subcutaneous fat in the bilateral gluteal region, right greater than left. These are favored to represent small subcutaneous hematomas or sequela of subcutaneous injections. IMPRESSION: 1. Diffuse wall thickening of the urinary  bladder with adjacent fat stranding, concerning for cystitis. Correlation with urinalysis is recommended. 2. Colonic diverticulosis without CT evidence for diverticulitis. 3. Cholelithiasis without acute inflammation. 4. Cardiomegaly. Aortic Atherosclerosis (ICD10-I70.0). Electronically Signed   By: Constance Holster M.D.   On: 05/23/2020 21:46     Procedures Procedures (including critical care time)  Medications Ordered in ED Medications  iohexol (OMNIPAQUE) 300 MG/ML solution 100 mL (100 mLs Intravenous Contrast Given 05/23/20 2128)  HYDROcodone-acetaminophen (NORCO/VICODIN) 5-325 MG per tablet 1 tablet (1 tablet Oral Given 05/23/20 2258)    ED Course  I have reviewed the triage vital signs and the nursing notes.  Pertinent labs & imaging results that were available during my care of the patient were reviewed by me and considered in my medical decision making (see chart for details).    MDM Rules/Calculators/A&P                            Patient here with reported dysuria and urinary frequency that has been present for some time.  Gradually worsening per patient.  Seen by his urologist on 05/12/2020 and started on medication for OAB.  He also has history of prostate cancer.    Bladder scan shows 74 mL post void  Work-up this evening includes labs, urinalysis, and CT of the abdomen pelvis.  Labs reviewed by me and are unremarkable.  CT scan of the abdomen and pelvis shows some bladder wall thickening with adjacent fat  stranding.  Urinalysis without evidence to suggest infection, culture pending Patient symptoms are felt to be secondary to his OAB.  He is well-appearing and nontoxic.  Ambulatory without difficulty.  I feel he is appropriate for discharge home I recommended that he continue his current medication and follow-up closely with his urologist.   Final Clinical Impression(s) / ED Diagnoses Final diagnoses:  Dysuria    Rx / DC Orders ED Discharge Orders    None       Kem Parkinson, PA-C 05/26/20 1449    Fredia Sorrow, MD 05/27/20 [phone]

## 2020-05-26 NOTE — Telephone Encounter (Signed)
Pt here this am for appt

## 2020-05-26 NOTE — Progress Notes (Signed)
Urological Symptom Review  Patient is experiencing the following symptoms: Burning/pain with urination Get up at night to urinate Leakage of urine Trouble starting stream Injury to kidneys/bladder Weak stream   Review of Systems  Gastrointestinal (upper)  : Negative for upper GI symptoms  Gastrointestinal (lower) : Negative for lower GI symptoms  Constitutional : Negative for symptoms  Skin: Negative for skin symptoms  Eyes: Negative for eye symptoms  Ear/Nose/Throat : Negative for Ear/Nose/Throat symptoms  Hematologic/Lymphatic: Negative for Hematologic/Lymphatic symptoms  Cardiovascular : Negative for cardiovascular symptoms  Respiratory : Negative for respiratory symptoms  Endocrine: Negative for endocrine symptoms  Musculoskeletal: Negative for musculoskeletal symptoms  Neurological: Negative for neurological symptoms  Psychologic: Negative for psychiatric symptoms

## 2020-05-27 ENCOUNTER — Telehealth: Payer: Self-pay | Admitting: Urology

## 2020-05-27 NOTE — Telephone Encounter (Signed)
Called pt. No answer. Left message to return call 

## 2020-05-27 NOTE — Telephone Encounter (Signed)
Pt called and states he has a knot on his groin that came up after taking doxycycline that was prescribed yesterday. He asked for a nurse to call him.

## 2020-05-28 ENCOUNTER — Other Ambulatory Visit: Payer: Self-pay | Admitting: Urology

## 2020-05-28 DIAGNOSIS — R3129 Other microscopic hematuria: Secondary | ICD-10-CM

## 2020-05-28 MED ORDER — SULFAMETHOXAZOLE-TRIMETHOPRIM 800-160 MG PO TABS: 1.0000 | ORAL_TABLET | Freq: Two times a day (BID) | ORAL | 0 refills | Status: DC

## 2020-05-28 NOTE — Telephone Encounter (Signed)
Pt. Came into office saying the Doxycycline prescribed for him caused a new adverse reaction. He wants to know if you can prescribe a new med to clear up the infection.

## 2020-05-28 NOTE — Telephone Encounter (Signed)
See other note. Pt came by the office

## 2020-06-03 ENCOUNTER — Telehealth: Payer: Self-pay | Admitting: Urology

## 2020-06-03 NOTE — Telephone Encounter (Signed)
P t  Was on schedule for 8/18 and states he cant put appt off any longer. He asked for a nurse to call him.

## 2020-06-04 ENCOUNTER — Encounter: Payer: Self-pay | Admitting: Urology

## 2020-06-04 NOTE — Telephone Encounter (Signed)
Per Dr. Alyson Ingles it is ok for pt to go back to work after the 28 days. Pt. made aware.

## 2020-06-04 NOTE — Telephone Encounter (Signed)
Pt appt moved due to office closing and will not return for f/u until after work note runs out. Return to work would be address at f/u. Pt wants to know if it ok to return after the 28 days or if he has to wait until his f/u. Please advise.

## 2020-06-04 NOTE — Progress Notes (Signed)
   05/26/2020  CC: Dysuria  HPI: [name] is a 70yo with a hx of radition therapy for prostate cancer who continues to have dysuria of completing radiation therapy. He was given multiple antibiotics for UTI which has failed to improve the dysuria  Blood pressure 135/68, pulse (!) 36, temperature 97.9 F (36.6 C), height 6\' 2"  (1.88 m), weight 265 lb (120.2 kg). NED. A&Ox3.   No respiratory distress   Abd soft, NT, ND Normal phallus with bilateral descended testicles  Cystoscopy Procedure Note  Patient identification was confirmed, informed consent was obtained, and patient was prepped using Betadine solution.  Lidocaine jelly was administered per urethral meatus.     Pre-Procedure: - Inspection reveals a normal caliber ureteral meatus.  Procedure: The flexible cystoscope was introduced without difficulty - No urethral strictures/lesions are present. - Enlarged prostate erythematous lobes bilaterally - Normal bladder neck - Bilateral ureteral orifices identified - Bladder mucosa  reveals no ulcers, tumors, or lesions - No bladder stones - No trabeculation  Retroflexion shows 1cm intravesical prostatic prostrusion   Post-Procedure: - Patient tolerated the procedure well  Assessment/ Plan: Doxycycline 100mg  BID for 28 days due to concern for prostatitis  Return in about 4 weeks (around 06/23/2020).  Nicolette Bang, MD

## 2020-06-15 ENCOUNTER — Telehealth: Payer: Self-pay | Admitting: Urology

## 2020-06-15 NOTE — Telephone Encounter (Signed)
Patient called back and left another message for return call.

## 2020-06-15 NOTE — Telephone Encounter (Signed)
Patient asked for a nurse to call him regarding out of work status.

## 2020-06-15 NOTE — Telephone Encounter (Signed)
Message left to return call.

## 2020-06-16 ENCOUNTER — Telehealth: Payer: Self-pay

## 2020-06-16 NOTE — Telephone Encounter (Signed)
Returned phone call. Pt said he had taken care of issue by coming into office yesterday and making an appt.

## 2020-06-21 ENCOUNTER — Other Ambulatory Visit: Payer: Self-pay

## 2020-06-21 ENCOUNTER — Encounter: Payer: Self-pay | Admitting: Urology

## 2020-06-21 ENCOUNTER — Ambulatory Visit (INDEPENDENT_AMBULATORY_CARE_PROVIDER_SITE_OTHER): Payer: Medicare HMO | Admitting: Urology

## 2020-06-21 VITALS — BP 119/83 | HR 60 | Temp 98.0°F | Ht 74.0 in | Wt 257.0 lb

## 2020-06-21 DIAGNOSIS — R300 Dysuria: Secondary | ICD-10-CM

## 2020-06-21 DIAGNOSIS — C61 Malignant neoplasm of prostate: Secondary | ICD-10-CM | POA: Diagnosis not present

## 2020-06-21 DIAGNOSIS — N3281 Overactive bladder: Secondary | ICD-10-CM | POA: Diagnosis not present

## 2020-06-21 DIAGNOSIS — R351 Nocturia: Secondary | ICD-10-CM

## 2020-06-21 LAB — URINALYSIS, ROUTINE W REFLEX MICROSCOPIC
Bilirubin, UA: NEGATIVE
Glucose, UA: NEGATIVE
Ketones, UA: NEGATIVE
Leukocytes,UA: NEGATIVE
Nitrite, UA: NEGATIVE
Specific Gravity, UA: 1.02 (ref 1.005–1.030)
Urobilinogen, Ur: 0.2 mg/dL (ref 0.2–1.0)
pH, UA: 5 (ref 5.0–7.5)

## 2020-06-21 MED ORDER — TOLTERODINE TARTRATE ER 4 MG PO CP24: 4.0000 mg | ORAL_CAPSULE | Freq: Every day | ORAL | 11 refills | Status: DC

## 2020-06-21 NOTE — Patient Instructions (Signed)

## 2020-06-21 NOTE — Progress Notes (Signed)
06/21/2020 9:31 AM   Gregory Faster Gabay Sr. 18-Feb-1951 [id number]  Referring provider: Sharilyn Sites, MD [address] [place],  [place] [address]  OAB and dysuria  HPI: Gregory Diaz is a 69yo here for followup for dysuria and OAB. Since last visit the dysuria improved over 90% since starting the bactrim. He stream is good on uroxatral 10mg  qhs. He continues to have urinary frequency and urgency on detrol 4mg . He does better when he doubles the medication. He has previously been on [place] and mirabegron.    PMH: Past Medical History:  Diagnosis Date  . Abnormal SPEP 09/09/2015  . Anxiety   . Arthritis    DDD, low back  . Early cataracts, bilateral   . Enlarged prostate   . Heart murmur    was told at age 47, but has not had any problems  . Hypertension   . MGUS (monoclonal gammopathy of unknown significance) 01/29/2016  . Prostate cancer ([place])   . Renal disorder     Surgical History: Past Surgical History:  Procedure Laterality Date  . BACK SURGERY  2004, 2015  . COLONOSCOPY    . EYE SURGERY     L eye- as a child   . FRACTURE SURGERY Right    thumb  . HEMORROIDECTOMY    . HERNIA REPAIR     umbilical hernia repair  . MASS EXCISION Right 07/11/2016   Procedure: EXCISION 6CM RIGHT SHOULDER MASS;  Surgeon: Vickie Epley, MD;  Location: [facility];  Service: Vascular;  Laterality: Right;  . PROSTATE BIOPSY    . TONSILLECTOMY    . WOUND DEBRIDEMENT N/A 10/25/2018   Procedure: DEBRIDEMENT OF MUSCLE AND FASCIA AT UMBILICUS;  Surgeon: Virl Cagey, MD;  Location: [facility];  Service: General;  Laterality: N/A;    Home Medications:  Allergies as of 06/21/2020      Reactions   Penicillins Swelling, Other (See Comments)   Has patient had a PCN reaction causing immediate rash, facial/tongue/throat swelling, SOB or lightheadedness with hypotension: Yes Has patient had a PCN reaction causing severe rash involving mucus membranes or skin necrosis: No Has patient had a PCN  reaction that required hospitalization No Has patient had a PCN reaction occurring within the last 10 years: No If all of the above answers are "NO", then may proceed with Cephalosporin use.      Medication List       Accurate as of June 21, 2020  9:31 AM. If you have any questions, ask your nurse or doctor.        alfuzosin 10 MG 24 hr tablet Commonly known as: UROXATRAL Take 1 tablet (10 mg total) by mouth in the morning and at bedtime.   ciclopirox 8 % solution Commonly known as: PENLAC Apply topically daily.   doxycycline 100 MG capsule Commonly known as: VIBRAMYCIN Take 1 capsule (100 mg total) by mouth every 12 (twelve) hours.   gabapentin 300 MG capsule Commonly known as: NEURONTIN Take 300 mg by mouth daily as needed (pain/neuropathy).   Januvia 100 MG tablet Generic drug: sitaGLIPtin Take 100 mg by mouth daily.   lisinopril 20 MG tablet Commonly known as: ZESTRIL   olmesartan 40 MG tablet Commonly known as: BENICAR Take 40 mg by mouth daily.   oxyCODONE-acetaminophen 5-325 MG tablet Commonly known as: PERCOCET/ROXICET One tab every 4-6 hours PRN   senna 8.6 MG tablet Commonly known as: SENOKOT Take 1-3 tablets by mouth daily as needed for constipation. Will take up  to 3 if needed   sulfamethoxazole-trimethoprim 800-160 MG tablet Commonly known as: BACTRIM DS Take 1 tablet by mouth 2 (two) times daily.   tolterodine 4 MG 24 hr capsule Commonly known as: Detrol LA Take 1 capsule (4 mg total) by mouth daily.   traZODone 50 MG tablet Commonly known as: DESYREL Take 50 mg by mouth at bedtime as needed for sleep.   valACYclovir 500 MG tablet Commonly known as: VALTREX Take 500 mg by mouth daily as needed (for fever blister/cold sores.).   Vitamin D (Ergocalciferol) 1.25 MG (50000 UNIT) Caps capsule Commonly known as: DRISDOL TAKE 1 CAPSULE BY MOUTH ONCE A WEEK FOR 12 WEEKS. AFTER THE 12TH PILL IS TAKEN THEN PT SHOULD START OVER THE COUNTER 2000  UNITS OF VITAMIN D   zolpidem 10 MG tablet Commonly known as: AMBIEN Take 5-10 mg by mouth at bedtime.       Allergies:  Allergies  Allergen Reactions  . Penicillins Swelling and Other (See Comments)    Has patient had a PCN reaction causing immediate rash, facial/tongue/throat swelling, SOB or lightheadedness with hypotension: Yes Has patient had a PCN reaction causing severe rash involving mucus membranes or skin necrosis: No Has patient had a PCN reaction that required hospitalization No Has patient had a PCN reaction occurring within the last 10 years: No If all of the above answers are "NO", then may proceed with Cephalosporin use.     Family History: Family History  Problem Relation Age of Onset  . Gallstones Father   . Ulcers Father   . Alcohol abuse Father   . Diabetes Daughter   . Diabetes Brother   . Diabetes Sister   . Breast cancer Sister     Social History:  reports that he has been smoking cigarettes. He has a 25.00 pack-year smoking history. He has never used smokeless tobacco. He reports that he does not drink alcohol and does not use drugs.  ROS: All other review of systems were reviewed and are negative except what is noted above in HPI  Physical Exam: BP 119/83   Pulse 60   Temp 98 F (36.7 C)   Ht 6\' 2"  (1.88 m)   Wt 257 lb (116.6 kg)   BMI 33.00 kg/m   Constitutional:  Alert and oriented, No acute distress. HEENT: [place] AT, moist mucus membranes.  Trachea midline, no masses. Cardiovascular: No clubbing, cyanosis, or edema. Respiratory: Normal respiratory effort, no increased work of breathing. GI: Abdomen is soft, nontender, nondistended, no abdominal masses GU: No CVA tenderness. Circumcised phallus. No masses/lesions on penis, testis, scrotum. Prostate 40g smooth no nodules no induration.  Lymph: No cervical or inguinal lymphadenopathy. Skin: No rashes, bruises or suspicious lesions. Neurologic: Grossly intact, no focal deficits, moving all 4  extremities. Psychiatric: Normal mood and affect.  Laboratory Data: Lab Results  Component Value Date   WBC 8.7 05/23/2020   HGB 15.2 05/23/2020   HCT 47.9 05/23/2020   MCV 86.5 05/23/2020   PLT 237 05/23/2020    Lab Results  Component Value Date   CREATININE 1.29 (H) 05/23/2020    No results found for: PSA  No results found for: TESTOSTERONE  Lab Results  Component Value Date   HGBA1C 5.5 10/16/2018    Urinalysis    Component Value Date/Time   COLORURINE AMBER (A) 05/23/2020 1846   APPEARANCEUR CLEAR 05/23/2020 1846   LABSPEC 1.029 05/23/2020 1846   PHURINE 5.0 05/23/2020 1846   GLUCOSEU NEGATIVE 05/23/2020 1846  HGBUR NEGATIVE 05/23/2020 1846   BILIRUBINUR NEGATIVE 05/23/2020 1846   BILIRUBINUR neg 05/12/2020 0917   KETONESUR NEGATIVE 05/23/2020 1846   PROTEINUR 100 (A) 05/23/2020 1846   UROBILINOGEN 0.2 05/12/2020 0917   UROBILINOGEN 0.2 08/22/2015 1609   NITRITE NEGATIVE 05/23/2020 1846   LEUKOCYTESUR NEGATIVE 05/23/2020 1846    Lab Results  Component Value Date   BACTERIA NONE SEEN 05/23/2020    Pertinent Imaging:  No results found for this or any previous visit.  No results found for this or any previous visit.  No results found for this or any previous visit.  No results found for this or any previous visit.  Results for orders placed during the hospital encounter of 08/22/15  US Renal  Narrative CLINICAL DATA:  Acute kidney injury  EXAM: RENAL / URINARY TRACT ULTRASOUND COMPLETE  COMPARISON:  None.  FINDINGS: Right Kidney:  Length: 15.6 cm. Numerous cysts in the upper and midpole all measuring less than or equal to 5 cm. No hydronephrosis. Normal echogenicity.  Left Kidney:  Length: 15.1 cm. Two lower pole cysts 1 measuring 4.4 cm in the other measuring 3.6 cm. No hydronephrosis. Normal echogenicity.  Bladder:  Appears normal for degree of bladder distention.  IMPRESSION: Bilateral simple cysts.  No acute  abnormalities.   Electronically Signed By: Skipper Cliche M.D. On: 08/24/2015 08:28  No results found for this or any previous visit.  No results found for this or any previous visit.  No results found for this or any previous visit.   Assessment & Plan:    1. Dysuria -patient to finish bactrim DS - Urinalysis, Routine w reflex microscopic  2. Nocturia -continue uroxatral 10mg    3. OAB -continue detrol 4mg  daily  RTC 6 months with PSA   No follow-ups on file.  Nicolette Bang, MD  [facility] [place]

## 2020-06-21 NOTE — Progress Notes (Signed)
Urological Symptom Review  Patient is experiencing the following symptoms: Penile pain (male only)    Review of Systems  Gastrointestinal (upper)  : Negative for upper GI symptoms  Gastrointestinal (lower) : Negative for lower GI symptoms  Constitutional : Negative for symptoms  Skin: Negative for skin symptoms  Eyes: Negative for eye symptoms  Ear/Nose/Throat : Negative for Ear/Nose/Throat symptoms  Hematologic/Lymphatic: Negative for Hematologic/Lymphatic symptoms  Cardiovascular : Negative for cardiovascular symptoms  Respiratory : Negative for respiratory symptoms  Endocrine: Negative for endocrine symptoms  Musculoskeletal: Back pain Joint pain  Neurological: Negative for neurological symptoms  Psychologic: Negative for psychiatric symptoms

## 2020-07-01 ENCOUNTER — Ambulatory Visit: Payer: Medicare HMO | Admitting: Urology

## 2020-07-03 DIAGNOSIS — N400 Enlarged prostate without lower urinary tract symptoms: Secondary | ICD-10-CM | POA: Diagnosis not present

## 2020-07-03 DIAGNOSIS — I499 Cardiac arrhythmia, unspecified: Secondary | ICD-10-CM | POA: Diagnosis not present

## 2020-07-03 DIAGNOSIS — I10 Essential (primary) hypertension: Secondary | ICD-10-CM | POA: Diagnosis not present

## 2020-07-03 DIAGNOSIS — G4700 Insomnia, unspecified: Secondary | ICD-10-CM | POA: Diagnosis not present

## 2020-07-03 DIAGNOSIS — L089 Local infection of the skin and subcutaneous tissue, unspecified: Secondary | ICD-10-CM | POA: Diagnosis not present

## 2020-07-03 DIAGNOSIS — E669 Obesity, unspecified: Secondary | ICD-10-CM | POA: Diagnosis not present

## 2020-07-03 DIAGNOSIS — R69 Illness, unspecified: Secondary | ICD-10-CM | POA: Diagnosis not present

## 2020-07-03 DIAGNOSIS — B009 Herpesviral infection, unspecified: Secondary | ICD-10-CM | POA: Diagnosis not present

## 2020-07-03 DIAGNOSIS — N3281 Overactive bladder: Secondary | ICD-10-CM | POA: Diagnosis not present

## 2020-07-03 DIAGNOSIS — N529 Male erectile dysfunction, unspecified: Secondary | ICD-10-CM | POA: Diagnosis not present

## 2020-07-26 DIAGNOSIS — N182 Chronic kidney disease, stage 2 (mild): Secondary | ICD-10-CM | POA: Diagnosis not present

## 2020-08-06 ENCOUNTER — Other Ambulatory Visit: Payer: Self-pay

## 2020-08-06 ENCOUNTER — Ambulatory Visit (INDEPENDENT_AMBULATORY_CARE_PROVIDER_SITE_OTHER): Payer: Medicare HMO | Admitting: Urology

## 2020-08-06 ENCOUNTER — Encounter: Payer: Self-pay | Admitting: Urology

## 2020-08-06 VITALS — BP 127/91 | HR 64 | Temp 98.6°F | Ht 74.0 in | Wt 257.0 lb

## 2020-08-06 DIAGNOSIS — N138 Other obstructive and reflux uropathy: Secondary | ICD-10-CM

## 2020-08-06 DIAGNOSIS — R351 Nocturia: Secondary | ICD-10-CM | POA: Diagnosis not present

## 2020-08-06 DIAGNOSIS — N401 Enlarged prostate with lower urinary tract symptoms: Secondary | ICD-10-CM

## 2020-08-06 DIAGNOSIS — N3281 Overactive bladder: Secondary | ICD-10-CM | POA: Diagnosis not present

## 2020-08-06 LAB — URINALYSIS, ROUTINE W REFLEX MICROSCOPIC
Bilirubin, UA: NEGATIVE
Glucose, UA: NEGATIVE
Ketones, UA: NEGATIVE
Leukocytes,UA: NEGATIVE
Nitrite, UA: NEGATIVE
Protein,UA: NEGATIVE
RBC, UA: NEGATIVE
Specific Gravity, UA: 1.025 (ref 1.005–1.030)
Urobilinogen, Ur: 0.2 mg/dL (ref 0.2–1.0)
pH, UA: 5.5 (ref 5.0–7.5)

## 2020-08-06 MED ORDER — FESOTERODINE FUMARATE ER 8 MG PO TB24: 8.0000 mg | ORAL_TABLET | Freq: Every day | ORAL | 3 refills | Status: DC

## 2020-08-06 NOTE — Progress Notes (Signed)

## 2020-08-06 NOTE — Progress Notes (Signed)
08/06/2020 3:43 PM   Gregory Domangue Schussler Sr. [birth year] [id number]  Referring provider: Sharilyn Sites, MD [address] [place],  [place] [address]  Urinary urgency  HPI: Gregory Diaz is a 69yo here for followup for urinary urgency and BPH. He is having less urinary urgency since last visit since starting the toviaz 4mg . He denies any dysuria or hematuria. UA is normal. No feeling of incomplete emptying. Stream is stong on uroxatral. Nocturia 1-2x.   PMH: Past Medical History:  Diagnosis Date  . Abnormal SPEP 09/09/2015  . Anxiety   . Arthritis    DDD, low back  . Early cataracts, bilateral   . Enlarged prostate   . Heart murmur    was told at age 66, but has not had any problems  . Hypertension   . MGUS (monoclonal gammopathy of unknown significance) 01/29/2016  . Prostate cancer ([place])   . Renal disorder     Surgical History: Past Surgical History:  Procedure Laterality Date  . BACK SURGERY  2004, 2015  . COLONOSCOPY    . EYE SURGERY     L eye- as a child   . FRACTURE SURGERY Right    thumb  . HEMORROIDECTOMY    . HERNIA REPAIR     umbilical hernia repair  . MASS EXCISION Right 07/11/2016   Procedure: EXCISION 6CM RIGHT SHOULDER MASS;  Surgeon: Vickie Epley, MD;  Location: [facility];  Service: Vascular;  Laterality: Right;  . PROSTATE BIOPSY    . TONSILLECTOMY    . WOUND DEBRIDEMENT N/A 10/25/2018   Procedure: DEBRIDEMENT OF MUSCLE AND FASCIA AT UMBILICUS;  Surgeon: Virl Cagey, MD;  Location: [facility];  Service: General;  Laterality: N/A;    Home Medications:  Allergies as of 08/06/2020      Reactions   Penicillins Swelling, Other (See Comments)   Has patient had a PCN reaction causing immediate rash, facial/tongue/throat swelling, SOB or lightheadedness with hypotension: Yes Has patient had a PCN reaction causing severe rash involving mucus membranes or skin necrosis: No Has patient had a PCN reaction that required hospitalization No Has patient had a  PCN reaction occurring within the last 10 years: No If all of the above answers are "NO", then may proceed with Cephalosporin use.      Medication List       Accurate as of August 06, 2020  3:43 PM. If you have any questions, ask your nurse or doctor.        alfuzosin 10 MG 24 hr tablet Commonly known as: UROXATRAL Take 1 tablet (10 mg total) by mouth in the morning and at bedtime.   ciclopirox 8 % solution Commonly known as: PENLAC Apply topically daily.   doxycycline 100 MG capsule Commonly known as: VIBRAMYCIN Take 1 capsule (100 mg total) by mouth every 12 (twelve) hours.   gabapentin 300 MG capsule Commonly known as: NEURONTIN Take 300 mg by mouth daily as needed (pain/neuropathy).   Januvia 100 MG tablet Generic drug: sitaGLIPtin Take 100 mg by mouth daily.   lisinopril 20 MG tablet Commonly known as: ZESTRIL   olmesartan 40 MG tablet Commonly known as: BENICAR Take 40 mg by mouth daily.   oxyCODONE-acetaminophen 5-325 MG tablet Commonly known as: PERCOCET/ROXICET One tab every 4-6 hours PRN   senna 8.6 MG tablet Commonly known as: SENOKOT Take 1-3 tablets by mouth daily as needed for constipation. Will take up to 3 if needed   sulfamethoxazole-trimethoprim 800-160 MG tablet Commonly known as:  BACTRIM DS Take 1 tablet by mouth 2 (two) times daily.   tolterodine 4 MG 24 hr capsule Commonly known as: Detrol LA Take 1 capsule (4 mg total) by mouth daily.   Toviaz 4 MG Tb24 tablet Generic drug: fesoterodine Take 4 mg by mouth daily.   traZODone 50 MG tablet Commonly known as: DESYREL Take 50 mg by mouth at bedtime as needed for sleep.   valACYclovir 500 MG tablet Commonly known as: VALTREX Take 500 mg by mouth daily as needed (for fever blister/cold sores.).   Vitamin D (Ergocalciferol) 1.25 MG (50000 UNIT) Caps capsule Commonly known as: DRISDOL TAKE 1 CAPSULE BY MOUTH ONCE A WEEK FOR 12 WEEKS. AFTER THE 12TH PILL IS TAKEN THEN PT SHOULD START  OVER THE COUNTER 2000 UNITS OF VITAMIN D   zolpidem 10 MG tablet Commonly known as: AMBIEN Take 5-10 mg by mouth at bedtime.       Allergies:  Allergies  Allergen Reactions  . Penicillins Swelling and Other (See Comments)    Has patient had a PCN reaction causing immediate rash, facial/tongue/throat swelling, SOB or lightheadedness with hypotension: Yes Has patient had a PCN reaction causing severe rash involving mucus membranes or skin necrosis: No Has patient had a PCN reaction that required hospitalization No Has patient had a PCN reaction occurring within the last 10 years: No If all of the above answers are "NO", then may proceed with Cephalosporin use.     Family History: Family History  Problem Relation Age of Onset  . Gallstones Father   . Ulcers Father   . Alcohol abuse Father   . Diabetes Daughter   . Diabetes Brother   . Diabetes Sister   . Breast cancer Sister     Social History:  reports that he has been smoking cigarettes. He has a 25.00 pack-year smoking history. He has never used smokeless tobacco. He reports that he does not drink alcohol and does not use drugs.  ROS: All other review of systems were reviewed and are negative except what is noted above in HPI  Physical Exam: BP (!) 127/91   Pulse 64   Temp 98.6 F (37 C)   Ht 6\' 2"  (1.88 m)   Wt 257 lb (116.6 kg)   BMI 33.00 kg/m   Constitutional:  Alert and oriented, No acute distress. HEENT: [place] AT, moist mucus membranes.  Trachea midline, no masses. Cardiovascular: No clubbing, cyanosis, or edema. Respiratory: Normal respiratory effort, no increased work of breathing. GI: Abdomen is soft, nontender, nondistended, no abdominal masses GU: No CVA tenderness.  Lymph: No cervical or inguinal lymphadenopathy. Skin: No rashes, bruises or suspicious lesions. Neurologic: Grossly intact, no focal deficits, moving all 4 extremities. Psychiatric: Normal mood and affect.  Laboratory Data: Lab Results    Component Value Date   WBC 8.7 05/23/2020   HGB 15.2 05/23/2020   HCT 47.9 05/23/2020   MCV 86.5 05/23/2020   PLT 237 05/23/2020    Lab Results  Component Value Date   CREATININE 1.29 (H) 05/23/2020    No results found for: PSA  No results found for: TESTOSTERONE  Lab Results  Component Value Date   HGBA1C 5.5 10/16/2018    Urinalysis    Component Value Date/Time   COLORURINE AMBER (A) 05/23/2020 1846   APPEARANCEUR Clear 08/06/2020 1501   LABSPEC 1.029 05/23/2020 1846   PHURINE 5.0 05/23/2020 1846   GLUCOSEU Negative 08/06/2020 1501   HGBUR NEGATIVE 05/23/2020 1846   BILIRUBINUR Negative 08/06/2020 1501  KETONESUR NEGATIVE 05/23/2020 1846   PROTEINUR Negative 08/06/2020 1501   PROTEINUR 100 (A) 05/23/2020 1846   UROBILINOGEN 0.2 05/12/2020 0917   UROBILINOGEN 0.2 08/22/2015 1609   NITRITE Negative 08/06/2020 1501   NITRITE NEGATIVE 05/23/2020 1846   LEUKOCYTESUR Negative 08/06/2020 1501   LEUKOCYTESUR NEGATIVE 05/23/2020 1846    Lab Results  Component Value Date   LABMICR Comment 08/06/2020   BACTERIA NONE SEEN 05/23/2020    Pertinent Imaging:  No results found for this or any previous visit.  No results found for this or any previous visit.  No results found for this or any previous visit.  No results found for this or any previous visit.  Results for orders placed during the hospital encounter of 08/22/15  US Renal  Narrative CLINICAL DATA:  Acute kidney injury  EXAM: RENAL / URINARY TRACT ULTRASOUND COMPLETE  COMPARISON:  None.  FINDINGS: Right Kidney:  Length: 15.6 cm. Numerous cysts in the upper and midpole all measuring less than or equal to 5 cm. No hydronephrosis. Normal echogenicity.  Left Kidney:  Length: 15.1 cm. Two lower pole cysts 1 measuring 4.4 cm in the other measuring 3.6 cm. No hydronephrosis. Normal echogenicity.  Bladder:  Appears normal for degree of bladder distention.  IMPRESSION: Bilateral simple  cysts.  No acute abnormalities.   Electronically Signed By: Skipper Cliche M.D. On: 08/24/2015 08:28  No results found for this or any previous visit.  No results found for this or any previous visit.  No results found for this or any previous visit.   Assessment & Plan:    1. Benign prostatic hyperplasia with urinary obstruction -continue uroxtral - Urinalysis, Routine w reflex microscopic  2. OAB (overactive bladder) -We will increase toviaz to 8mg . If this fails to improve his urinary urgency we will proceed with PTNS  3. Nocturia Continue uroxatral 10mg  qhs   No follow-ups on file.  Nicolette Bang, MD  [facility] [place]

## 2020-08-06 NOTE — Patient Instructions (Signed)

## 2020-08-11 DIAGNOSIS — I129 Hypertensive chronic kidney disease with stage 1 through stage 4 chronic kidney disease, or unspecified chronic kidney disease: Secondary | ICD-10-CM | POA: Diagnosis not present

## 2020-08-11 DIAGNOSIS — R809 Proteinuria, unspecified: Secondary | ICD-10-CM | POA: Diagnosis not present

## 2020-08-11 DIAGNOSIS — D631 Anemia in chronic kidney disease: Secondary | ICD-10-CM | POA: Diagnosis not present

## 2020-08-11 DIAGNOSIS — N041 Nephrotic syndrome with focal and segmental glomerular lesions: Secondary | ICD-10-CM | POA: Diagnosis not present

## 2020-08-11 DIAGNOSIS — D472 Monoclonal gammopathy: Secondary | ICD-10-CM | POA: Diagnosis not present

## 2020-08-11 DIAGNOSIS — N182 Chronic kidney disease, stage 2 (mild): Secondary | ICD-10-CM | POA: Diagnosis not present

## 2020-08-11 DIAGNOSIS — C61 Malignant neoplasm of prostate: Secondary | ICD-10-CM | POA: Diagnosis not present

## 2020-08-11 DIAGNOSIS — E1122 Type 2 diabetes mellitus with diabetic chronic kidney disease: Secondary | ICD-10-CM | POA: Diagnosis not present

## 2020-08-11 DIAGNOSIS — Z720 Tobacco use: Secondary | ICD-10-CM | POA: Diagnosis not present

## 2020-08-11 DIAGNOSIS — E669 Obesity, unspecified: Secondary | ICD-10-CM | POA: Diagnosis not present

## 2020-08-12 ENCOUNTER — Ambulatory Visit: Payer: Medicare HMO | Admitting: Urology

## 2020-08-27 ENCOUNTER — Ambulatory Visit (INDEPENDENT_AMBULATORY_CARE_PROVIDER_SITE_OTHER): Payer: Medicare HMO

## 2020-08-27 ENCOUNTER — Other Ambulatory Visit: Payer: Self-pay

## 2020-08-27 ENCOUNTER — Ambulatory Visit: Payer: Medicare HMO

## 2020-08-27 DIAGNOSIS — N3281 Overactive bladder: Secondary | ICD-10-CM | POA: Diagnosis not present

## 2020-08-27 NOTE — Progress Notes (Signed)
PTNS  Session # 1  Health & Social Factors:  Caffeine: 1 cup day Alcohol: none Daytime voids #per day: 8 Night-time voids #per night: several Urgency: frequenct Incontinence Episodes #per day: minimal Ankle used: right Treatment Setting: 6 Feeling/ Response: positive sensation to rt foot/heel Comments:   Performed By: Gibson Ramp RN  Assistant: Hope Cobb, LPM  Follow Up:  Week f/u for #2

## 2020-09-01 ENCOUNTER — Ambulatory Visit (INDEPENDENT_AMBULATORY_CARE_PROVIDER_SITE_OTHER): Payer: Medicare HMO

## 2020-09-01 ENCOUNTER — Other Ambulatory Visit: Payer: Self-pay

## 2020-09-01 DIAGNOSIS — N3281 Overactive bladder: Secondary | ICD-10-CM | POA: Diagnosis not present

## 2020-09-01 NOTE — Progress Notes (Signed)
PTNS  Session # 2 of 12  Health & Social Factors: NA Caffeine: 1 p/d Alcohol: 0 Daytime voids #per day: 2 Night-time voids #per night: 2 Urgency: Tolerable on med Incontinence Episodes #per day: 0 on med Ankle used: Right after trying left Treatment Setting: Toe flexion on 11 meter set on 12 Feeling/ Response: Says doesn"t feel it after hitting button to raise number Comments: Have to go by this pts toe flexion   Performed By: Antionette Char, Ollie Delano,LPN    Follow Up: 1 week for #3 of 12

## 2020-09-08 ENCOUNTER — Ambulatory Visit (INDEPENDENT_AMBULATORY_CARE_PROVIDER_SITE_OTHER): Payer: Medicare HMO

## 2020-09-08 ENCOUNTER — Other Ambulatory Visit: Payer: Self-pay

## 2020-09-08 DIAGNOSIS — N3281 Overactive bladder: Secondary | ICD-10-CM | POA: Diagnosis not present

## 2020-09-08 NOTE — Progress Notes (Signed)
PTNS  Session # 3 of 12  Health & Social Factors: NA Caffeine: 1 Alcohol: 0 Daytime voids #per day: 5 Night-time voids #per night: 3 to 4 Urgency: Strong if doesn't take meds Incontinence Episodes #per day: 0 Ankle used: R (need to use right each time) Treatment Setting: 12 Feeling/ Response: No change Comments: Pt says each time that when you first push button he feels a pulse but then it goes right away.  Performed By: Jorge Ny    Follow Up:  1wk

## 2020-09-14 DIAGNOSIS — Z1331 Encounter for screening for depression: Secondary | ICD-10-CM | POA: Diagnosis not present

## 2020-09-14 DIAGNOSIS — G894 Chronic pain syndrome: Secondary | ICD-10-CM | POA: Diagnosis not present

## 2020-09-14 DIAGNOSIS — I10 Essential (primary) hypertension: Secondary | ICD-10-CM | POA: Diagnosis not present

## 2020-09-14 DIAGNOSIS — M48061 Spinal stenosis, lumbar region without neurogenic claudication: Secondary | ICD-10-CM | POA: Diagnosis not present

## 2020-09-14 DIAGNOSIS — E119 Type 2 diabetes mellitus without complications: Secondary | ICD-10-CM | POA: Diagnosis not present

## 2020-09-14 DIAGNOSIS — Z0000 Encounter for general adult medical examination without abnormal findings: Secondary | ICD-10-CM | POA: Diagnosis not present

## 2020-09-14 DIAGNOSIS — Z1389 Encounter for screening for other disorder: Secondary | ICD-10-CM | POA: Diagnosis not present

## 2020-09-14 DIAGNOSIS — Z6837 Body mass index (BMI) 37.0-37.9, adult: Secondary | ICD-10-CM | POA: Diagnosis not present

## 2020-09-15 ENCOUNTER — Ambulatory Visit: Payer: Medicare HMO

## 2020-09-15 ENCOUNTER — Other Ambulatory Visit: Payer: Self-pay

## 2020-09-15 DIAGNOSIS — N3281 Overactive bladder: Secondary | ICD-10-CM

## 2020-09-15 NOTE — Progress Notes (Signed)
PTNS  Session #4 of 12  Health & Social Factors: NA Caffeine: 1 p/d Alcohol: 0 Daytime voids #per day: 3 or 4   Night-time voids #per night: 6 sometimes Urgency: Strong if not taken meds Incontinence Episodes #per day: 0 Ankle used: R (always) Treatment Setting: 11 (whole foot jumped at 5) (but said sensation goes away until 11 then steady) Feeling/ Response: Feeling sensation more than before Comments:   Performed By: Nakyiah Kuck,LPN    Follow Up:  1 wk

## 2020-09-22 ENCOUNTER — Other Ambulatory Visit: Payer: Self-pay

## 2020-09-22 ENCOUNTER — Ambulatory Visit (INDEPENDENT_AMBULATORY_CARE_PROVIDER_SITE_OTHER): Payer: Medicare HMO

## 2020-09-22 DIAGNOSIS — R300 Dysuria: Secondary | ICD-10-CM

## 2020-09-22 DIAGNOSIS — N3281 Overactive bladder: Secondary | ICD-10-CM

## 2020-09-22 NOTE — Progress Notes (Signed)
PTNS  Session # 5 of 12  Health & Social Factors: NA Caffeine: 1 Alcohol: 0 Daytime voids #per day: 4 to 5 Night-time voids #per night: 2 Urgency: Urgency is ok on meds Incontinence Episodes #per day: 0 Ankle used: Right Treatment Setting: 9 Feeling/ Response: Feels sensation part way up leg Comments: Doesn't"t feel any difference with treatments  Performed By: Jorge Ny    Follow Up: 1 wk

## 2020-09-29 ENCOUNTER — Other Ambulatory Visit: Payer: Self-pay

## 2020-09-29 ENCOUNTER — Ambulatory Visit (INDEPENDENT_AMBULATORY_CARE_PROVIDER_SITE_OTHER): Payer: Medicare HMO

## 2020-09-29 DIAGNOSIS — N3281 Overactive bladder: Secondary | ICD-10-CM | POA: Diagnosis not present

## 2020-09-29 NOTE — Progress Notes (Signed)
PTNS  Session # 6 of 12  Health & Social Factors: N/A Caffeine: 0 Alcohol: 0 Daytime voids #per day: 4 Night-time voids #per night: 5 Urgency: Sometime Incontinence Episodes #per day: 0 Ankle used: Left Treatment Setting: 7 Feeling/ Response: Says he feels it at first then it goes away.  Comments: Wants to know why he feels it then it goes right away. Says it is not working then.  Performed By: Antionette Char, Arieonna Medine,LPN  Follow Up: 1 wk

## 2020-10-04 ENCOUNTER — Ambulatory Visit (INDEPENDENT_AMBULATORY_CARE_PROVIDER_SITE_OTHER): Payer: Medicare HMO

## 2020-10-04 ENCOUNTER — Other Ambulatory Visit: Payer: Self-pay

## 2020-10-04 DIAGNOSIS — R32 Unspecified urinary incontinence: Secondary | ICD-10-CM

## 2020-10-04 NOTE — Progress Notes (Signed)
PTNS  Session # 7 of 12  Health & Social Factors: NA Caffeine: 0 Alcohol: 0 Daytime voids #per day: 4 or more Night-time voids #per night: 5 or more Urgency: Same Incontinence Episodes #per day: 0 Ankle used: Left Treatment Setting: 17 Feeling/ Response: Felt like hornet sting. Leave it there.  Comments: None  Performed By: Antionette Char, Loghan Kurtzman,LPN  Follow Up: 1 wk

## 2020-10-13 ENCOUNTER — Other Ambulatory Visit: Payer: Self-pay

## 2020-10-13 ENCOUNTER — Ambulatory Visit (INDEPENDENT_AMBULATORY_CARE_PROVIDER_SITE_OTHER): Payer: Medicare HMO

## 2020-10-13 DIAGNOSIS — N3281 Overactive bladder: Secondary | ICD-10-CM

## 2020-10-13 NOTE — Progress Notes (Signed)
PTNS  Session # 8 of 12  Health & Social Factors: NA Caffeine: 0 Alcohol: 0 Daytime voids #per day: 3 Night-time voids #per night: 4 Urgency: Yes Incontinence Episodes #per day: 0 Ankle used: left Treatment Setting: 17 Feeling/ Response: Beesting then nothing Comments: N/A  Performed By: Valentina Lucks, LPN   Follow Up:  1 wk

## 2020-10-20 ENCOUNTER — Other Ambulatory Visit: Payer: Self-pay

## 2020-10-20 ENCOUNTER — Ambulatory Visit: Payer: Medicare HMO

## 2020-10-20 DIAGNOSIS — N3281 Overactive bladder: Secondary | ICD-10-CM

## 2020-10-20 NOTE — Progress Notes (Unsigned)
PTNS  Session #  9 of 12  Health & Social Factors: NA Caffeine: 0 Alcohol: 0 Daytime voids #per day: 3 Night-time voids #per night:  4 to 7 Urgency: Yes if don't take med. Incontinence Episodes #per day: 0 Ankle used: Left Treatment Setting: 9 Feeling/ Response: Starts feeling then goes away every time Comments: None  Performed By: Antionette Char, Sya Nestler,LPN    Follow Up: 1 wk

## 2020-10-27 ENCOUNTER — Ambulatory Visit (INDEPENDENT_AMBULATORY_CARE_PROVIDER_SITE_OTHER): Payer: Medicare HMO

## 2020-10-27 ENCOUNTER — Other Ambulatory Visit: Payer: Self-pay

## 2020-10-27 DIAGNOSIS — N3281 Overactive bladder: Secondary | ICD-10-CM | POA: Diagnosis not present

## 2020-10-27 NOTE — Progress Notes (Signed)
PTNS  Session # 10  Health & Social Factors: NA Caffeine: 1 Alcohol:0 Daytime voids #per day: 3 or 4 Night-time voids #per night: 6 sometimes Urgency: strong if doesn't take meds Incontinence Episodes #per day: 0 Ankle used: Left Treatment Setting: 12 Feeling/ Response: Always start feeling then goes away. Comments: NA  Performed By: Antionette Char, Bradin Mcadory,LPN    Follow Up: ! wk

## 2020-11-03 ENCOUNTER — Other Ambulatory Visit: Payer: Self-pay

## 2020-11-03 ENCOUNTER — Ambulatory Visit (INDEPENDENT_AMBULATORY_CARE_PROVIDER_SITE_OTHER): Payer: Medicare HMO

## 2020-11-03 DIAGNOSIS — R32 Unspecified urinary incontinence: Secondary | ICD-10-CM | POA: Diagnosis not present

## 2020-11-03 NOTE — Progress Notes (Signed)
PTNS  Session # 11 of 12  Health & Social Factors: NA Caffeine: 1 Alcohol: 0 Daytime voids #per day: 3 or 4 Night-time voids #per night: 6 sometimes Urgency: Strong if don't take meds Incontinence Episodes #per day: 0 Ankle used: left Treatment Setting: 9 Feeling/ Response: Always the same starts out strong then goes away Comments: none  Performed By: Antionette Char, Emersynn Deatley,LPN    Follow Up:  1 wk

## 2020-11-10 ENCOUNTER — Ambulatory Visit: Payer: Medicare HMO

## 2020-11-17 ENCOUNTER — Other Ambulatory Visit: Payer: Self-pay

## 2020-11-17 ENCOUNTER — Ambulatory Visit (INDEPENDENT_AMBULATORY_CARE_PROVIDER_SITE_OTHER): Payer: Medicare HMO

## 2020-11-17 DIAGNOSIS — R32 Unspecified urinary incontinence: Secondary | ICD-10-CM | POA: Diagnosis not present

## 2020-11-17 NOTE — Progress Notes (Signed)
PTNS  Session #  12 of 12  Health & Social Factors: NA Caffeine: None Alcohol: None Daytime voids #per day: 4  Night-time voids #per night: 5 Urgency: yes Incontinence Episodes #per day: none Ankle used: left Treatment Setting: 10 Feeling/ Response: None Comments: None  Performed By: Sarita Haver, Casanova Schurman,LPN

## 2020-11-24 ENCOUNTER — Telehealth: Payer: Self-pay | Admitting: Urology

## 2020-11-24 ENCOUNTER — Other Ambulatory Visit: Payer: Self-pay

## 2020-11-24 DIAGNOSIS — N3281 Overactive bladder: Secondary | ICD-10-CM

## 2020-11-24 MED ORDER — ALFUZOSIN HCL ER 10 MG PO TB24: 10.0000 mg | ORAL_TABLET | Freq: Two times a day (BID) | ORAL | 3 refills | Status: DC

## 2020-11-24 NOTE — Telephone Encounter (Signed)
Refill sent.

## 2020-11-24 NOTE — Telephone Encounter (Signed)
Pt needs refill on Alfuzosin HCL ER 10mg 

## 2020-12-05 ENCOUNTER — Telehealth: Payer: Self-pay | Admitting: Urology

## 2020-12-06 DIAGNOSIS — N182 Chronic kidney disease, stage 2 (mild): Secondary | ICD-10-CM | POA: Diagnosis not present

## 2020-12-06 NOTE — Telephone Encounter (Signed)
Patient called and LM requesting a refill on his prostate medication.

## 2020-12-07 NOTE — Telephone Encounter (Signed)
Prescription sent and error in transmission (e-prescribing was down).  Gave Rx information over the phone to the [facility] in [place].

## 2020-12-08 ENCOUNTER — Other Ambulatory Visit: Payer: Self-pay | Admitting: Urology

## 2020-12-10 DIAGNOSIS — E119 Type 2 diabetes mellitus without complications: Secondary | ICD-10-CM | POA: Diagnosis not present

## 2020-12-10 DIAGNOSIS — I10 Essential (primary) hypertension: Secondary | ICD-10-CM | POA: Diagnosis not present

## 2020-12-15 ENCOUNTER — Other Ambulatory Visit: Payer: Self-pay | Admitting: Urology

## 2020-12-15 ENCOUNTER — Other Ambulatory Visit: Payer: Medicare HMO

## 2020-12-15 DIAGNOSIS — C61 Malignant neoplasm of prostate: Secondary | ICD-10-CM | POA: Diagnosis not present

## 2020-12-16 LAB — PSA: Prostate Specific Ag, Serum: 0.7 ng/mL (ref 0.0–4.0)

## 2020-12-20 ENCOUNTER — Telehealth: Payer: Self-pay

## 2020-12-20 NOTE — Telephone Encounter (Signed)
-----   Message from Patrick L McKenzie, MD sent at 12/20/2020  9:46 AM EST ----- normal ----- Message ----- From: Salma Walrond, LPN Sent: 12/17/2020   9:09 AM EST To: Patrick L McKenzie, MD  Pls review.  

## 2020-12-20 NOTE — Telephone Encounter (Signed)
Attempted to call pt. Left message for pt to return call.

## 2020-12-21 DIAGNOSIS — N182 Chronic kidney disease, stage 2 (mild): Secondary | ICD-10-CM | POA: Diagnosis not present

## 2020-12-23 ENCOUNTER — Ambulatory Visit: Payer: Medicare HMO | Admitting: Urology

## 2020-12-24 ENCOUNTER — Ambulatory Visit: Payer: Medicare HMO | Admitting: Urology

## 2020-12-25 ENCOUNTER — Emergency Department (HOSPITAL_COMMUNITY)
Admission: EM | Admit: 2020-12-25 | Discharge: 2020-12-25 | Disposition: A | Discharge status: Home / Self Care | Payer: Medicare HMO | Attending: Emergency Medicine | Admitting: Emergency Medicine

## 2020-12-25 ENCOUNTER — Encounter (HOSPITAL_COMMUNITY): Payer: Self-pay | Admitting: Emergency Medicine

## 2020-12-25 ENCOUNTER — Emergency Department (HOSPITAL_COMMUNITY): Payer: Medicare HMO

## 2020-12-25 ENCOUNTER — Other Ambulatory Visit: Payer: Self-pay

## 2020-12-25 DIAGNOSIS — Z23 Encounter for immunization: Secondary | ICD-10-CM | POA: Diagnosis not present

## 2020-12-25 DIAGNOSIS — S8992XA Unspecified injury of left lower leg, initial encounter: Secondary | ICD-10-CM | POA: Diagnosis not present

## 2020-12-25 DIAGNOSIS — I5031 Acute diastolic (congestive) heart failure: Secondary | ICD-10-CM | POA: Diagnosis not present

## 2020-12-25 DIAGNOSIS — Z79899 Other long term (current) drug therapy: Secondary | ICD-10-CM | POA: Insufficient documentation

## 2020-12-25 DIAGNOSIS — S81012A Laceration without foreign body, left knee, initial encounter: Secondary | ICD-10-CM | POA: Insufficient documentation

## 2020-12-25 DIAGNOSIS — F17210 Nicotine dependence, cigarettes, uncomplicated: Secondary | ICD-10-CM | POA: Insufficient documentation

## 2020-12-25 DIAGNOSIS — R69 Illness, unspecified: Secondary | ICD-10-CM | POA: Diagnosis not present

## 2020-12-25 DIAGNOSIS — Z8546 Personal history of malignant neoplasm of prostate: Secondary | ICD-10-CM | POA: Diagnosis not present

## 2020-12-25 DIAGNOSIS — I110 Hypertensive heart disease with heart failure: Secondary | ICD-10-CM | POA: Insufficient documentation

## 2020-12-25 DIAGNOSIS — S81812D Laceration without foreign body, left lower leg, subsequent encounter: Secondary | ICD-10-CM | POA: Diagnosis not present

## 2020-12-25 DIAGNOSIS — I708 Atherosclerosis of other arteries: Secondary | ICD-10-CM | POA: Diagnosis not present

## 2020-12-25 DIAGNOSIS — W312XXA Contact with powered woodworking and forming machines, initial encounter: Secondary | ICD-10-CM | POA: Diagnosis not present

## 2020-12-25 DIAGNOSIS — M1712 Unilateral primary osteoarthritis, left knee: Secondary | ICD-10-CM | POA: Diagnosis not present

## 2020-12-25 MED ORDER — TETANUS-DIPHTH-ACELL PERTUSSIS 5-2.5-18.5 LF-MCG/0.5 IM SUSY
0.5000 mL | PREFILLED_SYRINGE | Freq: Once | INTRAMUSCULAR | Status: AC
  Administered 2020-12-25: 0.5 mL via INTRAMUSCULAR
  Filled 2020-12-25: qty 0.5

## 2020-12-25 MED ORDER — LIDOCAINE HCL (PF) 2 % IJ SOLN: 20.0000 mL | Freq: Once | INTRAMUSCULAR | Status: AC

## 2020-12-25 MED ORDER — HYDROCODONE-ACETAMINOPHEN 5-325 MG PO TABS
1.0000 | ORAL_TABLET | Freq: Once | ORAL | Status: AC
  Administered 2020-12-25: 1 via ORAL
  Filled 2020-12-25: qty 1

## 2020-12-25 MED ORDER — LIDOCAINE HCL (PF) 2 % IJ SOLN
INTRAMUSCULAR | Status: AC
  Administered 2020-12-25: 20 mL via INTRADERMAL
  Filled 2020-12-25: qty 40

## 2020-12-25 MED ORDER — POVIDONE-IODINE 10 % EX SOLN
CUTANEOUS | Status: AC
  Filled 2020-12-25: qty 75

## 2020-12-25 NOTE — ED Triage Notes (Signed)
Pt states about 45 mins ago he was cutting down wood and his chainsaw cut his right leg, below his knee. Bleeding controlled at this time

## 2020-12-25 NOTE — ED Notes (Signed)
Telfa,abd pad, kerlix , and ace dressing applied to left leg sutured wound. Patient tolerated well.

## 2020-12-25 NOTE — ED Provider Notes (Signed)
[facility] EMERGENCY DEPARTMENT Provider Note   CSN: [id number] Arrival date & time: 12/25/20  1125     History Chief Complaint  Patient presents with  . Leg Injury    Gregory A Mutschler Sr. is a 70 y.o. male.  HPI He presents for evaluation of injury to left knee area, when a operating chainsaw, bumped into it.  He denies other injuries.  He was amatory afterwards and presents here with only laceration.  He denies severe pain.  Last tetanus booster, was about 9 years ago.  There are no other known modifying factors.    Past Medical History:  Diagnosis Date  . Abnormal SPEP 09/09/2015  . Anxiety   . Arthritis    DDD, low back  . Early cataracts, bilateral   . Enlarged prostate   . Heart murmur    was told at age 34, but has not had any problems  . Hypertension   . MGUS (monoclonal gammopathy of unknown significance) 01/29/2016  . Prostate cancer ([place])   . Renal disorder     Patient Active Problem List   Diagnosis Date Noted  . Dysuria 06/21/2020  . Nocturia 03/15/2020  . Urinary incontinence 01/28/2020  . Benign prostatic hyperplasia with urinary obstruction 01/28/2020  . OAB (overactive bladder) 01/28/2020  . Acute cystitis without hematuria 01/28/2020  . Chronic wound infection of abdomen   . Suture granuloma 09/25/2018  . Malignant neoplasm of prostate ([place]) 06/01/2018  . MGUS (monoclonal gammopathy of unknown significance) 01/29/2016  . Acute diastolic CHF (congestive heart failure) ([place]) 08/27/2015  . AKI (acute kidney injury) ([place])   . Nephrotic syndrome   . Heart failure with acute decompensation, type unknown ([place]) 08/22/2015  . Acute CHF (congestive heart failure) ([place]) 08/22/2015  . HNP (herniated nucleus pulposus), lumbar 05/31/2015  . Spinal stenosis at L4-L5 level 10/19/2014  . Radiculopathy of leg 10/23/2012  . Hypertension 03/31/2012    Past Surgical History:  Procedure Laterality Date  . BACK SURGERY  2004, 2015  . COLONOSCOPY    . EYE  SURGERY     L eye- as a child   . FRACTURE SURGERY Right    thumb  . HEMORROIDECTOMY    . HERNIA REPAIR     umbilical hernia repair  . MASS EXCISION Right 07/11/2016   Procedure: EXCISION 6CM RIGHT SHOULDER MASS;  Surgeon: Vickie Epley, MD;  Location: [facility];  Service: Vascular;  Laterality: Right;  . PROSTATE BIOPSY    . TONSILLECTOMY    . WOUND DEBRIDEMENT N/A 10/25/2018   Procedure: DEBRIDEMENT OF MUSCLE AND FASCIA AT UMBILICUS;  Surgeon: Virl Cagey, MD;  Location: [facility];  Service: General;  Laterality: N/A;       Family History  Problem Relation Age of Onset  . Gallstones Father   . Ulcers Father   . Alcohol abuse Father   . Diabetes Daughter   . Diabetes Brother   . Diabetes Sister   . Breast cancer Sister     Social History   Tobacco Use  . Smoking status: Current Every Day Smoker    Packs/day: 0.50    Years: 50.00    Pack years: 25.00    Types: Cigarettes  . Smokeless tobacco: Never Used  Vaping Use  . Vaping Use: Never used  Substance Use Topics  . Alcohol use: No  . Drug use: No    Home Medications Prior to Admission medications   Medication Sig Start Date End Date Taking? Authorizing Provider  alfuzosin (UROXATRAL) 10 MG 24 hr tablet Take 1 tablet (10 mg total) by mouth in the morning and at bedtime. 11/24/20   McKenzie, Candee Furbish, MD  ciclopirox (PENLAC) 8 % solution Apply topically daily. 04/26/20   [provider]  doxycycline (VIBRAMYCIN) 100 MG capsule Take 1 capsule (100 mg total) by mouth every 12 (twelve) hours. 05/26/20   McKenzie, Candee Furbish, MD  gabapentin (NEURONTIN) 300 MG capsule Take 300 mg by mouth daily as needed (pain/neuropathy).  01/15/18   [provider]  JANUVIA 100 MG tablet Take 100 mg by mouth daily. 08/07/19   [provider]  lisinopril (ZESTRIL) 20 MG tablet  08/25/19   [provider]  olmesartan (BENICAR) 40 MG tablet Take 40 mg by mouth daily.  10/08/18   [provider]   oxyCODONE-acetaminophen (PERCOCET/ROXICET) 5-325 MG tablet One tab every 4-6 hours PRN 01/09/19   [provider]  senna (SENOKOT) 8.6 MG tablet Take 1-3 tablets by mouth daily as needed for constipation. Will take up to 3 if needed     [provider]  sulfamethoxazole-trimethoprim (BACTRIM DS) 800-160 MG tablet Take 1 tablet by mouth 2 (two) times daily. 05/28/20   McKenzie, Candee Furbish, MD  tolterodine (DETROL LA) 4 MG 24 hr capsule Take 1 capsule (4 mg total) by mouth daily. 06/21/20   McKenzie, Candee Furbish, MD  TOVIAZ 8 MG TB24 tablet Take 1 tablet by mouth once daily 12/08/20   Cleon Gustin, MD  traZODone (DESYREL) 50 MG tablet Take 50 mg by mouth at bedtime as needed for sleep.  03/13/18   [provider]  valACYclovir (VALTREX) 500 MG tablet Take 500 mg by mouth daily as needed (for fever blister/cold sores.).  04/29/18   [provider]  Vitamin D, Ergocalciferol, (DRISDOL) 1.25 MG (50000 UNIT) CAPS capsule TAKE 1 CAPSULE BY MOUTH ONCE A WEEK FOR 12 WEEKS. AFTER THE 12TH PILL IS TAKEN THEN PT SHOULD START OVER THE COUNTER 2000 UNITS OF VITAMIN D 03/11/20   [provider]  zolpidem (AMBIEN) 10 MG tablet Take 5-10 mg by mouth at bedtime. 04/24/20   [provider]    Allergies    Penicillins  Review of Systems   Review of Systems  All other systems reviewed and are negative.   Physical Exam Updated Vital Signs BP 135/86   Pulse 67   Temp 98.3 F (36.8 C)   Resp 17   Ht 6\' 2"  (1.88 m)   Wt 127 kg   SpO2 97%   BMI 35.95 kg/m   Physical Exam Vitals and nursing note reviewed.  Constitutional:      General: He is not in acute distress.    Appearance: He is well-developed and well-nourished. He is not ill-appearing, toxic-appearing or diaphoretic.  HENT:     Head: Normocephalic and atraumatic.     Right Ear: External ear normal.     Left Ear: External ear normal.  Eyes:     Extraocular Movements: EOM normal.      Conjunctiva/sclera: Conjunctivae normal.     Pupils: Pupils are equal, round, and reactive to light.  Neck:     Trachea: Phonation normal.  Cardiovascular:     Rate and Rhythm: Normal rate.  Pulmonary:     Effort: Pulmonary effort is normal.  Chest:     Chest wall: No bony tenderness.  Abdominal:     General: There is no distension.  Musculoskeletal:  General: Normal range of motion.     Cervical back: Normal range of motion and neck supple.     Comments: Large laceration, beneath the left knee, gaping with evident deep tissue injury, likely periosteum at the base.  No visible foreign body.  He is able to extend the left knee and flex somewhat without significant disability.  Skin:    General: Skin is warm, dry and intact.  Neurological:     Mental Status: He is alert and oriented to person, place, and time.     Cranial Nerves: No cranial nerve deficit.     Sensory: No sensory deficit.     Motor: No abnormal muscle tone.     Coordination: Coordination normal.  Psychiatric:        Mood and Affect: Mood and affect and mood normal.        Behavior: Behavior normal.        Thought Content: Thought content normal.        Judgment: Judgment normal.     ED Results / Procedures / Treatments   Labs (all labs ordered are listed, but only abnormal results are displayed) Labs Reviewed - No data to display  EKG None  Radiology DG Knee 2 Views Left  Result Date: 12/25/2020 CLINICAL DATA:  Injury with chain saw EXAM: LEFT KNEE - 1-2 VIEW COMPARISON:  May 13, 2016 FINDINGS: Frontal and lateral views obtained. No fracture or dislocation. No joint effusion. There is moderate narrowing medially with moderate narrowing in the patellofemoral joint. No erosive change. No radiopaque foreign body. There are scattered foci of arterial vascular calcification. IMPRESSION: Areas of osteoarthritic change. No fracture, dislocation, or joint effusion. No radiopaque foreign body or soft tissue air.  Foci of atherosclerotic arterial vascular calcification noted. Electronically Signed   By: Lowella Grip III M.D.   On: 12/25/2020 12:17    Procedures .Marland KitchenLaceration Repair  Date/Time: 12/25/2020 1:35 PM Performed by: Daleen Bo, MD Authorized by: Daleen Bo, MD   Consent:    Consent obtained:  Verbal   Consent given by:  Patient   Risks, benefits, and alternatives were discussed: yes     Risks discussed:  Infection, pain, poor cosmetic result, poor wound healing and need for additional repair Universal protocol:    Immediately prior to procedure, a time out was called: yes     Patient identity confirmed:  Verbally with patient Anesthesia:    Anesthesia method:  Local infiltration   Local anesthetic:  Lidocaine 2% w/o epi Laceration details:    Location:  Leg   Leg location:  L knee   Length (cm):  9.5   Depth (mm):  15 Pre-procedure details:    Preparation:  Patient was prepped and draped in usual sterile fashion Exploration:    Limited defect created (wound extended): no     Hemostasis achieved with:  Direct pressure   Wound exploration: wound explored through full range of motion and entire depth of wound visualized     Wound extent: underlying fracture     Wound extent: no foreign bodies/material noted, no muscle damage noted, no nerve damage noted, no tendon damage noted and no vascular damage noted     Contaminated: no   Treatment:    Area cleansed with:  Povidone-iodine   Amount of cleaning:  Standard   Irrigation solution:  Sterile water   Irrigation volume:  50 cc   Irrigation method:  Syringe   Visualized foreign bodies/material removed: no  Debridement:  None   Undermining:  None   Scar revision: no     Layers/structures repaired:  Deep subcutaneous Deep subcutaneous:    Suture size:  3-0   Suture material:  Vicryl   Suture technique:  Vertical mattress   Number of sutures:  6 Skin repair:    Repair method:  Sutures   Suture size:  3-0    Suture material:  Prolene   Suture technique:  Simple interrupted   Number of sutures:  13 Approximation:    Approximation:  Loose Repair type:    Repair type:  Intermediate Post-procedure details:    Dressing:  Non-adherent dressing, sterile dressing and bulky dressing   Procedure completion:  Tolerated well, no immediate complications Comments:     Wound is essentially horizontal, the more lateral aspect, is deeper, with an approximately 1/2 cm area that extends to visible tibia with small increase in the bone at this point.  No active bleeding or instability about this injury.  No visualized or palpable foreign body.  Wound extends through a portion of the joint fascia which is adherent to the tibia.  There is no significant injury to the extensor mechanism.     Medications Ordered in ED Medications  HYDROcodone-acetaminophen (NORCO/VICODIN) 5-325 MG per tablet 1 tablet (has no administration in time range)  Tdap (BOOSTRIX) injection 0.5 mL (0.5 mLs Intramuscular Given 12/25/20 1340)  lidocaine HCl (PF) (XYLOCAINE) 2 % injection 20 mL (20 mLs Intradermal Given by Other 12/25/20 1339)  povidone-iodine (BETADINE) 10 % external solution (  Given by Other 12/25/20 1340)    ED Course  I have reviewed the triage vital signs and the nursing notes.  Pertinent labs & imaging results that were available during my care of the patient were reviewed by me and considered in my medical decision making (see chart for details).    MDM Rules/Calculators/A&P                           Patient Vitals for the past 24 hrs:  BP Temp Pulse Resp SpO2 Height Weight  12/25/20 1300 135/86 -- 67 17 97 % -- --  12/25/20 1230 123/78 -- 61 16 95 % -- --  12/25/20 1140 115/80 98.3 F (36.8 C) (!) 38 16 96 % -- --  12/25/20 1137 -- -- -- -- -- 6\' 2"  (1.88 m) 127 kg    12:46 PM Reevaluation with update and discussion. After initial assessment and treatment, an updated evaluation reveals he is more comfortable  now and has no further complaints.  Findings discussed and questions answered. Daleen Bo   Medical Decision Making:  This patient is presenting for evaluation of laceration, left knee region, by chainsaw, which does require a range of treatment options, and is a complaint that involves a moderate risk of morbidity and mortality. The differential diagnoses include deep injury including fracture, retained foreign body, skin/soft tissue injury. I decided to review old records, and in summary patient injured with a moving chainsaw blade, isolated single laceration that is deep.  I did not require additional historical information from anyone.   Radiologic Tests Ordered, included x-ray left knee.  I independently Visualized: Radiograph images, which show no evident fracture or foreign body    Critical Interventions-clinical evaluation, radiography, laceration repair by me, observation reassessment  After These Interventions, the Patient was reevaluated and was found stable for discharge.  Large laceration beneath left knee joint sutured by me.  There is likely a small superficial bony injury however this does not compromise the tibial structure.  I do not think that this requires antibiotic treatment at this time.  I was able to irrigate the area extensively and closed the tissue over it.  Wound was closed in a loose fashion.  Tetanus booster given.  Wound care and precautions discussed with patient, by me.  CRITICAL CARE-no Performed by: Daleen Bo  Nursing Notes Reviewed/ Care Coordinated Applicable Imaging Reviewed Interpretation of Laboratory Data incorporated into ED treatment  The patient appears reasonably screened and/or stabilized for discharge and I doubt any other medical condition or other [facility] requiring further screening, evaluation, or treatment in the ED at this time prior to discharge.  Plan: Home Medications-continue usual; Home Treatments-wound care at home; return here  if the recommended treatment, does not improve the symptoms; Recommended follow up-suture removal in 2 weeks, return for signs of infection or other concern     Final Clinical Impression(s) / ED Diagnoses Final diagnoses:  Laceration of left knee, initial encounter    Rx / DC Orders ED Discharge Orders    None       Daleen Bo, MD 12/25/20 1345

## 2020-12-25 NOTE — ED Notes (Signed)
Gone to Xray  

## 2020-12-25 NOTE — Discharge Instructions (Addendum)
Remove the bandage tomorrow, and clean the wound daily with soap and water.  Elevate the left leg above your heart to help decrease pain.  Use Tylenol or Motrin as needed for pain relief.  Have your doctor remove the suture in 2 weeks.  If you are concerned about infection, with symptoms such as pain, drainage or bleeding, return here for evaluation.

## 2020-12-25 NOTE — ED Notes (Addendum)
Laceration cleaned and covered with dressing.

## 2020-12-26 ENCOUNTER — Encounter (HOSPITAL_COMMUNITY): Payer: Self-pay | Admitting: Emergency Medicine

## 2020-12-26 ENCOUNTER — Other Ambulatory Visit: Payer: Self-pay

## 2020-12-26 ENCOUNTER — Emergency Department (HOSPITAL_COMMUNITY)
Admission: EM | Admit: 2020-12-26 | Discharge: 2020-12-26 | Disposition: A | Discharge status: Home / Self Care | Payer: Medicare HMO | Attending: Emergency Medicine | Admitting: Emergency Medicine

## 2020-12-26 DIAGNOSIS — Z79899 Other long term (current) drug therapy: Secondary | ICD-10-CM | POA: Diagnosis not present

## 2020-12-26 DIAGNOSIS — F17210 Nicotine dependence, cigarettes, uncomplicated: Secondary | ICD-10-CM | POA: Insufficient documentation

## 2020-12-26 DIAGNOSIS — S81012A Laceration without foreign body, left knee, initial encounter: Secondary | ICD-10-CM | POA: Diagnosis not present

## 2020-12-26 DIAGNOSIS — S81812D Laceration without foreign body, left lower leg, subsequent encounter: Secondary | ICD-10-CM | POA: Diagnosis not present

## 2020-12-26 DIAGNOSIS — I110 Hypertensive heart disease with heart failure: Secondary | ICD-10-CM | POA: Diagnosis not present

## 2020-12-26 DIAGNOSIS — Z8546 Personal history of malignant neoplasm of prostate: Secondary | ICD-10-CM | POA: Insufficient documentation

## 2020-12-26 DIAGNOSIS — S81012D Laceration without foreign body, left knee, subsequent encounter: Secondary | ICD-10-CM | POA: Insufficient documentation

## 2020-12-26 DIAGNOSIS — R69 Illness, unspecified: Secondary | ICD-10-CM | POA: Diagnosis not present

## 2020-12-26 DIAGNOSIS — X58XXXD Exposure to other specified factors, subsequent encounter: Secondary | ICD-10-CM | POA: Insufficient documentation

## 2020-12-26 DIAGNOSIS — I5031 Acute diastolic (congestive) heart failure: Secondary | ICD-10-CM | POA: Insufficient documentation

## 2020-12-26 MED ORDER — LIDOCAINE HCL (PF) 1 % IJ SOLN
5.0000 mL | Freq: Once | INTRAMUSCULAR | Status: AC
  Administered 2020-12-26: 5 mL via INTRADERMAL
  Filled 2020-12-26: qty 30

## 2020-12-26 NOTE — Discharge Instructions (Signed)
Continue to clean the wound daily, and be careful about bending the knee. Watch for signs of infection. Return here if needed.

## 2020-12-26 NOTE — ED Provider Notes (Signed)
[facility] EMERGENCY DEPARTMENT Provider Note   CSN: [id number] Arrival date & time: 12/26/20  0636     History Chief Complaint  Patient presents with  . Laceration    Gregory A Morino Sr. is a 70 y.o. male.  HPI The patient is here for evaluation of a partially dehisced wound. He states he took the bandage off yesterday to look at it noticed that 2 of the stitches had popped loose. He reports mild pain in the area. There is some bleeding from the wound but no other drainage. He denies severe pain. He is able to ambulate. There are no other known active modifying factors.    Past Medical History:  Diagnosis Date  . Abnormal SPEP 09/09/2015  . Anxiety   . Arthritis    DDD, low back  . Early cataracts, bilateral   . Enlarged prostate   . Heart murmur    was told at age 16, but has not had any problems  . Hypertension   . MGUS (monoclonal gammopathy of unknown significance) 01/29/2016  . Prostate cancer ([place])   . Renal disorder     Patient Active Problem List   Diagnosis Date Noted  . Dysuria 06/21/2020  . Nocturia 03/15/2020  . Urinary incontinence 01/28/2020  . Benign prostatic hyperplasia with urinary obstruction 01/28/2020  . OAB (overactive bladder) 01/28/2020  . Acute cystitis without hematuria 01/28/2020  . Chronic wound infection of abdomen   . Suture granuloma 09/25/2018  . Malignant neoplasm of prostate ([place]) 06/01/2018  . MGUS (monoclonal gammopathy of unknown significance) 01/29/2016  . Acute diastolic CHF (congestive heart failure) ([place]) 08/27/2015  . AKI (acute kidney injury) ([place])   . Nephrotic syndrome   . Heart failure with acute decompensation, type unknown ([place]) 08/22/2015  . Acute CHF (congestive heart failure) ([place]) 08/22/2015  . HNP (herniated nucleus pulposus), lumbar 05/31/2015  . Spinal stenosis at L4-L5 level 10/19/2014  . Radiculopathy of leg 10/23/2012  . Hypertension 03/31/2012    Past Surgical History:  Procedure Laterality Date   . BACK SURGERY  2004, 2015  . COLONOSCOPY    . EYE SURGERY     L eye- as a child   . FRACTURE SURGERY Right    thumb  . HEMORROIDECTOMY    . HERNIA REPAIR     umbilical hernia repair  . MASS EXCISION Right 07/11/2016   Procedure: EXCISION 6CM RIGHT SHOULDER MASS;  Surgeon: Vickie Epley, MD;  Location: [facility];  Service: Vascular;  Laterality: Right;  . PROSTATE BIOPSY    . TONSILLECTOMY    . WOUND DEBRIDEMENT N/A 10/25/2018   Procedure: DEBRIDEMENT OF MUSCLE AND FASCIA AT UMBILICUS;  Surgeon: Virl Cagey, MD;  Location: [facility];  Service: General;  Laterality: N/A;       Family History  Problem Relation Age of Onset  . Gallstones Father   . Ulcers Father   . Alcohol abuse Father   . Diabetes Daughter   . Diabetes Brother   . Diabetes Sister   . Breast cancer Sister     Social History   Tobacco Use  . Smoking status: Current Every Day Smoker    Packs/day: 0.50    Years: 50.00    Pack years: 25.00    Types: Cigarettes  . Smokeless tobacco: Never Used  Vaping Use  . Vaping Use: Never used  Substance Use Topics  . Alcohol use: No  . Drug use: No    Home Medications Prior to Admission medications  Medication Sig Start Date End Date Taking? Authorizing Provider  alfuzosin (UROXATRAL) 10 MG 24 hr tablet Take 1 tablet (10 mg total) by mouth in the morning and at bedtime. 11/24/20   McKenzie, Candee Furbish, MD  ciclopirox (PENLAC) 8 % solution Apply topically daily. 04/26/20   [provider]  doxycycline (VIBRAMYCIN) 100 MG capsule Take 1 capsule (100 mg total) by mouth every 12 (twelve) hours. 05/26/20   McKenzie, Candee Furbish, MD  gabapentin (NEURONTIN) 300 MG capsule Take 300 mg by mouth daily as needed (pain/neuropathy).  01/15/18   [provider]  JANUVIA 100 MG tablet Take 100 mg by mouth daily. 08/07/19   [provider]  lisinopril (ZESTRIL) 20 MG tablet  08/25/19   [provider]  olmesartan (BENICAR) 40 MG tablet Take 40 mg  by mouth daily.  10/08/18   [provider]  oxyCODONE-acetaminophen (PERCOCET/ROXICET) 5-325 MG tablet One tab every 4-6 hours PRN 01/09/19   [provider]  senna (SENOKOT) 8.6 MG tablet Take 1-3 tablets by mouth daily as needed for constipation. Will take up to 3 if needed     [provider]  sulfamethoxazole-trimethoprim (BACTRIM DS) 800-160 MG tablet Take 1 tablet by mouth 2 (two) times daily. 05/28/20   McKenzie, Candee Furbish, MD  tolterodine (DETROL LA) 4 MG 24 hr capsule Take 1 capsule (4 mg total) by mouth daily. 06/21/20   McKenzie, Candee Furbish, MD  TOVIAZ 8 MG TB24 tablet Take 1 tablet by mouth once daily 12/08/20   Cleon Gustin, MD  traZODone (DESYREL) 50 MG tablet Take 50 mg by mouth at bedtime as needed for sleep.  03/13/18   [provider]  valACYclovir (VALTREX) 500 MG tablet Take 500 mg by mouth daily as needed (for fever blister/cold sores.).  04/29/18   [provider]  Vitamin D, Ergocalciferol, (DRISDOL) 1.25 MG (50000 UNIT) CAPS capsule TAKE 1 CAPSULE BY MOUTH ONCE A WEEK FOR 12 WEEKS. AFTER THE 12TH PILL IS TAKEN THEN PT SHOULD START OVER THE COUNTER 2000 UNITS OF VITAMIN D 03/11/20   [provider]  zolpidem (AMBIEN) 10 MG tablet Take 5-10 mg by mouth at bedtime. 04/24/20   [provider]    Allergies    Penicillins  Review of Systems   Review of Systems  All other systems reviewed and are negative.   Physical Exam Updated Vital Signs BP 128/84   Pulse 62   Temp 98.1 F (36.7 C) (Oral)   Resp 17   Ht 6\' 2"  (1.88 m)   Wt 127 kg   SpO2 98%   BMI 35.95 kg/m   Physical Exam Vitals and nursing note reviewed.  Constitutional:      General: He is not in acute distress.    Appearance: He is well-developed and well-nourished. He is not ill-appearing or diaphoretic.  HENT:     Head: Normocephalic and atraumatic.     Right Ear: External ear normal.     Left Ear: External ear normal.  Eyes:      Extraocular Movements: EOM normal.     Conjunctiva/sclera: Conjunctivae normal.     Pupils: Pupils are equal, round, and reactive to light.  Neck:     Trachea: Phonation normal.  Cardiovascular:     Rate and Rhythm: Normal rate.  Pulmonary:     Effort: Pulmonary effort is normal.  Chest:     Chest wall: No bony tenderness.  Musculoskeletal:        General:  Normal range of motion.     Cervical back: Normal range of motion and neck supple.  Skin:    General: Skin is warm, dry and intact.     Comments: Wound just below the left knee joint, medial proximal tibial region, with partial dehiscence of the lateral 2 and half centimeters of the wound. The deep subcutaneous stitches at this region are intact, and preventing deep dehiscence. The skin around the wound edges is irregular secondary to the traumatic injury yesterday. There is mild bleeding that was able to be controlled with light pressure. Discussed pluses and minuses of closure with patient he elected for closure.  Neurological:     Mental Status: He is alert and oriented to person, place, and time.     Cranial Nerves: No cranial nerve deficit.     Sensory: No sensory deficit.     Motor: No abnormal muscle tone.     Coordination: Coordination normal.  Psychiatric:        Mood and Affect: Mood and affect and mood normal.        Behavior: Behavior normal.        Thought Content: Thought content normal.        Judgment: Judgment normal.     ED Results / Procedures / Treatments   Labs (all labs ordered are listed, but only abnormal results are displayed) Labs Reviewed - No data to display  EKG None  Radiology DG Knee 2 Views Left  Result Date: 12/25/2020 CLINICAL DATA:  Injury with chain saw EXAM: LEFT KNEE - 1-2 VIEW COMPARISON:  May 13, 2016 FINDINGS: Frontal and lateral views obtained. No fracture or dislocation. No joint effusion. There is moderate narrowing medially with moderate narrowing in the patellofemoral joint. No  erosive change. No radiopaque foreign body. There are scattered foci of arterial vascular calcification. IMPRESSION: Areas of osteoarthritic change. No fracture, dislocation, or joint effusion. No radiopaque foreign body or soft tissue air. Foci of atherosclerotic arterial vascular calcification noted. Electronically Signed   By: Lowella Grip III M.D.   On: 12/25/2020 12:17    Procedures .Marland KitchenLaceration Repair  Date/Time: 12/26/2020 8:15 AM Performed by: Daleen Bo, MD Authorized by: Daleen Bo, MD   Consent:    Consent obtained:  Verbal   Consent given by:  Patient   Risks discussed:  Infection Universal protocol:    Immediately prior to procedure, a time out was called: yes     Patient identity confirmed:  Verbally with patient Anesthesia:    Anesthesia method:  Local infiltration   Local anesthetic:  Lidocaine 1% w/o epi Laceration details:    Location:  Leg   Leg location:  L knee   Length (cm):  2.5 (Partial dehiscence of the wound sutured yesterday.)   Depth (mm):  5 Pre-procedure details:    Preparation:  Patient was prepped and draped in usual sterile fashion Exploration:    Hemostasis achieved with:  Direct pressure   Wound extent: no fascia violation noted, no foreign bodies/material noted and no muscle damage noted     Contaminated: no   Treatment:    Area cleansed with:  Shur-Clens   Amount of cleaning:  Standard   Irrigation solution:  Sterile water   Irrigation volume:  30 cc   Irrigation method:  Syringe   Visualized foreign bodies/material removed: no     Debridement:  None Skin repair:    Repair method:  Sutures   Suture size:  3-0   Suture material:  Prolene   Suture technique:  Vertical mattress   Number of sutures:  2 Approximation:    Approximation:  Loose Repair type:    Repair type:  Simple Post-procedure details:    Dressing:  Non-adherent dressing, sterile dressing and bulky dressing   Procedure completion:  Tolerated well, no  immediate complications Comments:     Elected to close partially dehisced wound, initially sutured less than 24 hours ago. Area that dehisced was in the deepest portion of the wound, and moderately irregular skin edges due to the traumatic injury from chainsaw. Wound does not appear infected at this time.     Medications Ordered in ED Medications  lidocaine (PF) (XYLOCAINE) 1 % injection 5 mL (5 mLs Intradermal Given 12/26/20 0813)    ED Course  I have reviewed the triage vital signs and the nursing notes.  Pertinent labs & imaging results that were available during my care of the patient were reviewed by me and considered in my medical decision making (see chart for details).    MDM Rules/Calculators/A&P                           Patient Vitals for the past 24 hrs:  BP Temp Temp src Pulse Resp SpO2 Height Weight  12/26/20 0739 -- -- -- 62 17 98 % -- --  12/26/20 0732 -- 98.1 F (36.7 C) Oral -- -- -- 6\' 2"  (1.88 m) 127 kg  12/26/20 0731 128/84 -- -- -- -- -- -- --    8:22 AM Reevaluation with update and discussion. After initial assessment and treatment, an updated evaluation reveals he is comfortable has no further complaints. Findings discussed and questions answered. Daleen Bo   Medical Decision Making:  This patient is presenting for evaluation of partially dehisced wound, which does require a range of treatment options, and is a complaint that involves a moderate risk of morbidity and mortality. The differential diagnoses include wound breakdown secondary to complications versus traction secondary to bending knee. 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  I did not require additional historical information from anyone.   Critical Interventions-clinical evaluation, discussion with patient regarding advantages and disadvantages of reclosure. The wound is less  than 24 hours old. Wound is not currently appearing infected. I reapproximated the wound edges loosely but firmly with vertical mattress suture. There area that had dehisced was the deepest area of the wound.  After These Interventions, the Patient was reevaluated and was found stable for discharge.  CRITICAL CARE-no Performed by: Daleen Bo  Nursing Notes Reviewed/ Care Coordinated Applicable Imaging Reviewed Interpretation of Laboratory Data incorporated into ED treatment  The patient appears reasonably screened and/or stabilized for discharge and I doubt any other medical condition or other [facility] requiring further screening, evaluation, or treatment in the ED at this time prior to discharge.  Plan: Home Medications-continue usual and use OTC medications of choice; Home Treatments-wound care at home. Avoid bending, Ace wrap given to help; return here if the recommended treatment, does not improve the symptoms; Recommended follow up-PCP follow-up in 13 days for suture removal.     Final Clinical Impression(s) / ED Diagnoses Final diagnoses:  Laceration of left lower extremity, subsequent encounter    Rx / DC Orders ED Discharge Orders    None       Daleen Bo,  MD 12/26/20 0825

## 2020-12-26 NOTE — ED Triage Notes (Signed)
Pt states he was at home and two of his stitches on his left knee came out.

## 2020-12-27 ENCOUNTER — Telehealth: Payer: Self-pay

## 2020-12-27 NOTE — Telephone Encounter (Signed)
Left message on voicemail that urine culture was normal.

## 2020-12-27 NOTE — Telephone Encounter (Signed)
-----   Message from Cleon Gustin, MD sent at 12/20/2020  9:46 AM EST ----- normal ----- Message ----- From: Valentina Lucks, LPN Sent: 02/14/9674   9:09 AM EST To: Cleon Gustin, MD  Pls review.

## 2021-01-04 DIAGNOSIS — T148XXD Other injury of unspecified body region, subsequent encounter: Secondary | ICD-10-CM | POA: Diagnosis not present

## 2021-01-04 DIAGNOSIS — S81812D Laceration without foreign body, left lower leg, subsequent encounter: Secondary | ICD-10-CM | POA: Diagnosis not present

## 2021-01-04 DIAGNOSIS — Z6838 Body mass index (BMI) 38.0-38.9, adult: Secondary | ICD-10-CM | POA: Diagnosis not present

## 2021-01-12 ENCOUNTER — Ambulatory Visit: Payer: Medicare HMO | Admitting: Urology

## 2021-01-12 DIAGNOSIS — S81012D Laceration without foreign body, left knee, subsequent encounter: Secondary | ICD-10-CM | POA: Diagnosis not present

## 2021-01-12 DIAGNOSIS — L03119 Cellulitis of unspecified part of limb: Secondary | ICD-10-CM | POA: Diagnosis not present

## 2021-01-12 DIAGNOSIS — Z6839 Body mass index (BMI) 39.0-39.9, adult: Secondary | ICD-10-CM | POA: Diagnosis not present

## 2021-01-13 ENCOUNTER — Other Ambulatory Visit (HOSPITAL_COMMUNITY): Payer: Self-pay | Admitting: Orthopedic Surgery

## 2021-01-13 ENCOUNTER — Ambulatory Visit (HOSPITAL_COMMUNITY)
Admission: RE | Admit: 2021-01-13 | Discharge: 2021-01-13 | Disposition: A | Discharge status: Home / Self Care | Payer: Medicare HMO | Source: Ambulatory Visit | Attending: Cardiovascular Disease | Admitting: Cardiovascular Disease

## 2021-01-13 ENCOUNTER — Other Ambulatory Visit: Payer: Self-pay

## 2021-01-13 DIAGNOSIS — M79662 Pain in left lower leg: Secondary | ICD-10-CM

## 2021-01-13 DIAGNOSIS — M7989 Other specified soft tissue disorders: Secondary | ICD-10-CM

## 2021-01-13 DIAGNOSIS — M79605 Pain in left leg: Secondary | ICD-10-CM | POA: Diagnosis not present

## 2021-01-13 DIAGNOSIS — L03119 Cellulitis of unspecified part of limb: Secondary | ICD-10-CM | POA: Diagnosis not present

## 2021-01-13 DIAGNOSIS — M25562 Pain in left knee: Secondary | ICD-10-CM | POA: Insufficient documentation

## 2021-01-13 DIAGNOSIS — M79604 Pain in right leg: Secondary | ICD-10-CM | POA: Diagnosis not present

## 2021-01-19 ENCOUNTER — Telehealth: Payer: Self-pay | Admitting: Urology

## 2021-01-19 ENCOUNTER — Other Ambulatory Visit: Payer: Self-pay

## 2021-01-19 ENCOUNTER — Encounter: Payer: Self-pay | Admitting: Urology

## 2021-01-19 ENCOUNTER — Ambulatory Visit (INDEPENDENT_AMBULATORY_CARE_PROVIDER_SITE_OTHER): Payer: Medicare HMO | Admitting: Urology

## 2021-01-19 VITALS — BP 136/81 | HR 59

## 2021-01-19 DIAGNOSIS — N3281 Overactive bladder: Secondary | ICD-10-CM

## 2021-01-19 DIAGNOSIS — C61 Malignant neoplasm of prostate: Secondary | ICD-10-CM | POA: Diagnosis not present

## 2021-01-19 LAB — URINALYSIS, ROUTINE W REFLEX MICROSCOPIC
Bilirubin, UA: NEGATIVE
Glucose, UA: NEGATIVE
Ketones, UA: NEGATIVE
Leukocytes,UA: NEGATIVE
Nitrite, UA: NEGATIVE
Protein,UA: NEGATIVE
Specific Gravity, UA: 1.025 (ref 1.005–1.030)
Urobilinogen, Ur: 0.2 mg/dL (ref 0.2–1.0)
pH, UA: 5.5 (ref 5.0–7.5)

## 2021-01-19 LAB — MICROSCOPIC EXAMINATION
Renal Epithel, UA: NONE SEEN /hpf
WBC, UA: NONE SEEN /hpf (ref 0–5)

## 2021-01-19 LAB — BLADDER SCAN AMB NON-IMAGING: Scan Result: 17

## 2021-01-19 MED ORDER — TOVIAZ 8 MG PO TB24: 8.0000 mg | ORAL_TABLET | Freq: Every day | ORAL | 11 refills | Status: DC

## 2021-01-19 MED ORDER — ALFUZOSIN HCL ER 10 MG PO TB24: 10.0000 mg | ORAL_TABLET | Freq: Two times a day (BID) | ORAL | 3 refills | Status: DC

## 2021-01-19 MED ORDER — TOLTERODINE TARTRATE ER 4 MG PO CP24: 4.0000 mg | ORAL_CAPSULE | Freq: Every day | ORAL | 11 refills | Status: DC

## 2021-01-19 NOTE — Progress Notes (Signed)
01/19/2021 3:56 PM   Gregory Matton Scarfo Sr. Nov 30, 1950 [id number]  Referring provider: Sharilyn Sites, MD [address] [place],  [place] [address]  followup OAb and prostate cancer  HPI: Gregory Diaz is a 70yo here for followup for OAB and prostate cancer. He complete 12 sessions of PTNS and noted significant improvement in his OAB symptoms. He is wishing to start maintenance PTNS. He remains on uroxatral and detrol 4mg . His PSa is stable at 0.7   PMH: Past Medical History:  Diagnosis Date  . Abnormal SPEP 09/09/2015  . Anxiety   . Arthritis    DDD, low back  . Early cataracts, bilateral   . Enlarged prostate   . Heart murmur    was told at age 14, but has not had any problems  . Hypertension   . MGUS (monoclonal gammopathy of unknown significance) 01/29/2016  . Prostate cancer ([place])   . Renal disorder     Surgical History: Past Surgical History:  Procedure Laterality Date  . BACK SURGERY  2004, 2015  . COLONOSCOPY    . EYE SURGERY     L eye- as a child   . FRACTURE SURGERY Right    thumb  . HEMORROIDECTOMY    . HERNIA REPAIR     umbilical hernia repair  . MASS EXCISION Right 07/11/2016   Procedure: EXCISION 6CM RIGHT SHOULDER MASS;  Surgeon: Vickie Epley, MD;  Location: [facility];  Service: Vascular;  Laterality: Right;  . PROSTATE BIOPSY    . TONSILLECTOMY    . WOUND DEBRIDEMENT N/A 10/25/2018   Procedure: DEBRIDEMENT OF MUSCLE AND FASCIA AT UMBILICUS;  Surgeon: Virl Cagey, MD;  Location: [facility];  Service: General;  Laterality: N/A;    Home Medications:  Allergies as of 01/19/2021      Reactions   Penicillins Swelling, Other (See Comments)   Has patient had a PCN reaction causing immediate rash, facial/tongue/throat swelling, SOB or lightheadedness with hypotension: Yes Has patient had a PCN reaction causing severe rash involving mucus membranes or skin necrosis: No Has patient had a PCN reaction that required hospitalization No Has patient had a PCN  reaction occurring within the last 10 years: No If all of the above answers are "NO", then may proceed with Cephalosporin use.      Medication List       Accurate as of January 19, 2021  3:56 PM. If you have any questions, ask your nurse or doctor.        STOP taking these medications   ciclopirox 8 % solution Commonly known as: PENLAC Stopped by: Nicolette Bang, MD   lisinopril 20 MG tablet Commonly known as: ZESTRIL Stopped by: Nicolette Bang, MD   senna 8.6 MG tablet Commonly known as: SENOKOT Stopped by: Nicolette Bang, MD   sulfamethoxazole-trimethoprim 800-160 MG tablet Commonly known as: BACTRIM DS Stopped by: Nicolette Bang, MD   Vitamin D (Ergocalciferol) 1.25 MG (50000 UNIT) Caps capsule Commonly known as: DRISDOL Stopped by: Nicolette Bang, MD     TAKE these medications   alfuzosin 10 MG 24 hr tablet Commonly known as: UROXATRAL Take 1 tablet (10 mg total) by mouth in the morning and at bedtime.   doxycycline 100 MG capsule Commonly known as: VIBRAMYCIN Take 1 capsule (100 mg total) by mouth every 12 (twelve) hours.   gabapentin 300 MG capsule Commonly known as: NEURONTIN Take 300 mg by mouth daily as needed (pain/neuropathy).   Januvia 100 MG tablet Generic drug: sitaGLIPtin Take  100 mg by mouth daily.   olmesartan 40 MG tablet Commonly known as: BENICAR Take 40 mg by mouth daily.   oxyCODONE-acetaminophen 5-325 MG tablet Commonly known as: PERCOCET/ROXICET One tab every 4-6 hours PRN   tolterodine 4 MG 24 hr capsule Commonly known as: Detrol LA Take 1 capsule (4 mg total) by mouth daily.   Toviaz 8 MG Tb24 tablet Generic drug: fesoterodine Take 1 tablet by mouth once daily   traZODone 50 MG tablet Commonly known as: DESYREL Take 50 mg by mouth at bedtime as needed for sleep.   valACYclovir 500 MG tablet Commonly known as: VALTREX Take 500 mg by mouth daily as needed (for fever blister/cold sores.).   zolpidem 10 MG  tablet Commonly known as: AMBIEN Take 5-10 mg by mouth at bedtime.       Allergies:  Allergies  Allergen Reactions  . Penicillins Swelling and Other (See Comments)    Has patient had a PCN reaction causing immediate rash, facial/tongue/throat swelling, SOB or lightheadedness with hypotension: Yes Has patient had a PCN reaction causing severe rash involving mucus membranes or skin necrosis: No Has patient had a PCN reaction that required hospitalization No Has patient had a PCN reaction occurring within the last 10 years: No If all of the above answers are "NO", then may proceed with Cephalosporin use.     Family History: Family History  Problem Relation Age of Onset  . Gallstones Father   . Ulcers Father   . Alcohol abuse Father   . Diabetes Daughter   . Diabetes Brother   . Diabetes Sister   . Breast cancer Sister     Social History:  reports that he has been smoking cigarettes. He has a 25.00 pack-year smoking history. He has never used smokeless tobacco. He reports that he does not drink alcohol and does not use drugs.  ROS: All other review of systems were reviewed and are negative except what is noted above in HPI  Physical Exam: BP 136/81   Pulse (!) 59   Constitutional:  Alert and oriented, No acute distress. HEENT: [place] AT, moist mucus membranes.  Trachea midline, no masses. Cardiovascular: No clubbing, cyanosis, or edema. Respiratory: Normal respiratory effort, no increased work of breathing. GI: Abdomen is soft, nontender, nondistended, no abdominal masses GU: No CVA tenderness.  Lymph: No cervical or inguinal lymphadenopathy. Skin: No rashes, bruises or suspicious lesions. Neurologic: Grossly intact, no focal deficits, moving all 4 extremities. Psychiatric: Normal mood and affect.  Laboratory Data: Lab Results  Component Value Date   WBC 8.7 05/23/2020   HGB 15.2 05/23/2020   HCT 47.9 05/23/2020   MCV 86.5 05/23/2020   PLT 237 05/23/2020    Lab  Results  Component Value Date   CREATININE 1.29 (H) 05/23/2020    No results found for: PSA  No results found for: TESTOSTERONE  Lab Results  Component Value Date   HGBA1C 5.5 10/16/2018    Urinalysis    Component Value Date/Time   COLORURINE AMBER (A) 05/23/2020 1846   APPEARANCEUR Clear 08/06/2020 1501   LABSPEC 1.029 05/23/2020 1846   PHURINE 5.0 05/23/2020 1846   GLUCOSEU Negative 08/06/2020 1501   HGBUR NEGATIVE 05/23/2020 1846   BILIRUBINUR Negative 08/06/2020 1501   KETONESUR NEGATIVE 05/23/2020 1846   PROTEINUR Negative 08/06/2020 1501   PROTEINUR 100 (A) 05/23/2020 1846   UROBILINOGEN 0.2 05/12/2020 0917   UROBILINOGEN 0.2 08/22/2015 1609   NITRITE Negative 08/06/2020 1501   NITRITE NEGATIVE 05/23/2020 1846  LEUKOCYTESUR Negative 08/06/2020 1501   LEUKOCYTESUR NEGATIVE 05/23/2020 1846    Lab Results  Component Value Date   LABMICR Comment 08/06/2020   BACTERIA NONE SEEN 05/23/2020    Pertinent Imaging:  No results found for this or any previous visit.  No results found for this or any previous visit.  No results found for this or any previous visit.  No results found for this or any previous visit.  Results for orders placed during the hospital encounter of 08/22/15  US Renal  Narrative CLINICAL DATA:  Acute kidney injury  EXAM: RENAL / URINARY TRACT ULTRASOUND COMPLETE  COMPARISON:  None.  FINDINGS: Right Kidney:  Length: 15.6 cm. Numerous cysts in the upper and midpole all measuring less than or equal to 5 cm. No hydronephrosis. Normal echogenicity.  Left Kidney:  Length: 15.1 cm. Two lower pole cysts 1 measuring 4.4 cm in the other measuring 3.6 cm. No hydronephrosis. Normal echogenicity.  Bladder:  Appears normal for degree of bladder distention.  IMPRESSION: Bilateral simple cysts.  No acute abnormalities.   Electronically Signed By: Skipper Cliche M.D. On: 08/24/2015 08:28  No results found for this or any  previous visit.  No results found for this or any previous visit.  No results found for this or any previous visit.   Assessment & Plan:    1. OAB (overactive bladder) -continue toviaz 8mg   -we will proceed with maintenance PTNS - Urinalysis, Routine w reflex microscopic - Bladder Scan (Post Void Residual) in office  2. Prostate cancer ([place]) -RTC 6 months with PSA   No follow-ups on file.  Nicolette Bang, MD  [facility] [place]

## 2021-01-19 NOTE — Progress Notes (Signed)
Urological Symptom Review  Patient is experiencing the following symptoms: Get up at night to urinate Stream starts and stops Have to strain to urinate Weak stream   Review of Systems  Gastrointestinal (upper)  : Negative for upper GI symptoms  Gastrointestinal (lower) : Negative for lower GI symptoms  Constitutional : Negative for symptoms  Skin: Negative for skin symptoms  Eyes: Negative for eye symptoms  Ear/Nose/Throat : Negative for Ear/Nose/Throat symptoms  Hematologic/Lymphatic: Negative for Hematologic/Lymphatic symptoms  Cardiovascular : Negative for cardiovascular symptoms  Respiratory : Negative for respiratory symptoms  Endocrine: Negative for endocrine symptoms  Musculoskeletal: Back pain Joint pain  Neurological: Headaches  Psychologic: Negative for psychiatric symptoms

## 2021-01-19 NOTE — Telephone Encounter (Signed)
Created in error

## 2021-01-19 NOTE — Patient Instructions (Signed)
Overactive Bladder, Adult  Overactive bladder is a condition in which a person has a sudden and frequent need to urinate. A person might also leak urine if he or she cannot get to the bathroom fast enough (urinary incontinence). Sometimes, symptoms can interfere with work or social activities. What are the causes? Overactive bladder is associated with poor nerve signals between your bladder and your brain. Your bladder may get the signal to empty before it is full. You may also have very sensitive muscles that make your bladder squeeze too soon. This condition may also be caused by other factors, such as:  Medical conditions: ? Urinary tract infection. ? Infection of nearby tissues. ? Prostate enlargement. ? Bladder stones, inflammation, or tumors. ? Diabetes. ? Muscle or nerve weakness, especially from these conditions:  A spinal cord injury.  Stroke.  Multiple sclerosis.  Parkinson's disease.  Other causes: ? Surgery on the uterus or urethra. ? Drinking too much caffeine or alcohol. ? Certain medicines, especially those that eliminate extra fluid in the body (diuretics). ? Constipation. What increases the risk? You may be at greater risk for overactive bladder if you:  Are an older adult.  Smoke.  Are going through menopause.  Have prostate problems.  Have a neurological disease, such as stroke, dementia, Parkinson's disease, or multiple sclerosis (MS).  Eat or drink alcohol, spicy food, caffeine, and other things that irritate the bladder.  Are overweight or obese. What are the signs or symptoms? Symptoms of this condition include a sudden, strong urge to urinate. Other symptoms include:  Leaking urine.  Urinating 8 or more times a day.  Waking up to urinate 2 or more times overnight. How is this diagnosed? This condition may be diagnosed based on:  Your symptoms and medical history.  A physical exam.  Blood or urine tests to check for possible causes,  such as infection. You may also need to see a health care provider who specializes in urinary tract problems. This is called a urologist. How is this treated? Treatment for overactive bladder depends on the cause of your condition and whether it is mild or severe. Treatment may include:  Bladder training, such as: ? Learning to control the urge to urinate by following a schedule to urinate at regular intervals. ? Doing Kegel exercises to strengthen the pelvic floor muscles that support your bladder.  Special devices, such as: ? Biofeedback. This uses sensors to help you become aware of your body's signals. ? Electrical stimulation. This uses electrodes placed inside the body (implanted) or outside the body. These electrodes send gentle pulses of electricity to strengthen the nerves or muscles that control the bladder. ? Women may use a plastic device, called a pessary, that fits into the vagina and supports the bladder.  Medicines, such as: ? Antibiotics to treat bladder infection. ? Antispasmodics to stop the bladder from releasing urine at the wrong time. ? Tricyclic antidepressants to relax bladder muscles. ? Injections of botulinum toxin type A directly into the bladder tissue to relax bladder muscles.  Surgery, such as: ? A device may be implanted to help manage the nerve signals that control urination. ? An electrode may be implanted to stimulate electrical signals in the bladder. ? A procedure may be done to change the shape of the bladder. This is done only in very severe cases. Follow these instructions at home: Eating and drinking  Make diet or lifestyle changes recommended by your health care provider. These may include: ? Drinking fluids   throughout the day and not only with meals. ? Cutting down on caffeine or alcohol. ? Eating a healthy and balanced diet to prevent constipation. This may include:  Choosing foods that are high in fiber, such as beans, whole grains, and  fresh fruits and vegetables.  Limiting foods that are high in fat and processed sugars, such as fried and sweet foods.   Lifestyle  Lose weight if needed.  Do not use any products that contain nicotine or tobacco. These include cigarettes, chewing tobacco, and vaping devices, such as e-cigarettes. If you need help quitting, ask your health care provider.   General instructions  Take over-the-counter and prescription medicines only as told by your health care provider.  If you were prescribed an antibiotic medicine, take it as told by your health care provider. Do not stop taking the antibiotic even if you start to feel better.  Use any implants or pessary as told by your health care provider.  If needed, wear pads to absorb urine leakage.  Keep a log to track how much and when you drink, and when you need to urinate. This will help your health care provider monitor your condition.  Keep all follow-up visits. This is important. Contact a health care provider if:  You have a fever or chills.  Your symptoms do not get better with treatment.  Your pain and discomfort get worse.  You have more frequent urges to urinate. Get help right away if:  You are not able to control your bladder. Summary  Overactive bladder refers to a condition in which a person has a sudden and frequent need to urinate.  Several conditions may lead to an overactive bladder.  Treatment for overactive bladder depends on the cause and severity of your condition.  Making lifestyle changes, doing Kegel exercises, keeping a log, and taking medicines can help with this condition. This information is not intended to replace advice given to you by your health care provider. Make sure you discuss any questions you have with your health care provider. Document Revised: 07/19/2020 Document Reviewed: 07/19/2020 Elsevier Patient Education  2021 Elsevier Inc.  

## 2021-01-24 ENCOUNTER — Other Ambulatory Visit: Payer: Self-pay

## 2021-01-24 ENCOUNTER — Ambulatory Visit (INDEPENDENT_AMBULATORY_CARE_PROVIDER_SITE_OTHER): Payer: Medicare HMO

## 2021-01-24 DIAGNOSIS — N3281 Overactive bladder: Secondary | ICD-10-CM

## 2021-01-24 NOTE — Progress Notes (Signed)
PTNS  Session # Monthly maintenance  Health & Social Factors: None Caffeine:  1 cup of coffee Alcohol: None Daytime voids #per day 3 or 4 Night-time voids #per night: 5 Urgency: Yes Incontinence Episodes #per day: 3 or 4 Ankle used: Right Treatment Setting: 7 Feeling/ Response: Strong sensation Comments: Nothing else  Performed By: Lorin Mercy    Follow Up: 1 month

## 2021-01-31 DIAGNOSIS — M79605 Pain in left leg: Secondary | ICD-10-CM | POA: Diagnosis not present

## 2021-02-01 DIAGNOSIS — R809 Proteinuria, unspecified: Secondary | ICD-10-CM | POA: Diagnosis not present

## 2021-02-01 DIAGNOSIS — I129 Hypertensive chronic kidney disease with stage 1 through stage 4 chronic kidney disease, or unspecified chronic kidney disease: Secondary | ICD-10-CM | POA: Diagnosis not present

## 2021-02-01 DIAGNOSIS — E1122 Type 2 diabetes mellitus with diabetic chronic kidney disease: Secondary | ICD-10-CM | POA: Diagnosis not present

## 2021-02-01 DIAGNOSIS — C61 Malignant neoplasm of prostate: Secondary | ICD-10-CM | POA: Diagnosis not present

## 2021-02-01 DIAGNOSIS — D631 Anemia in chronic kidney disease: Secondary | ICD-10-CM | POA: Diagnosis not present

## 2021-02-01 DIAGNOSIS — N182 Chronic kidney disease, stage 2 (mild): Secondary | ICD-10-CM | POA: Diagnosis not present

## 2021-02-01 DIAGNOSIS — N041 Nephrotic syndrome with focal and segmental glomerular lesions: Secondary | ICD-10-CM | POA: Diagnosis not present

## 2021-02-02 DIAGNOSIS — N182 Chronic kidney disease, stage 2 (mild): Secondary | ICD-10-CM | POA: Diagnosis not present

## 2021-02-24 ENCOUNTER — Ambulatory Visit: Payer: Medicare HMO

## 2021-03-07 ENCOUNTER — Ambulatory Visit (INDEPENDENT_AMBULATORY_CARE_PROVIDER_SITE_OTHER): Payer: Medicare HMO

## 2021-03-07 ENCOUNTER — Other Ambulatory Visit: Payer: Self-pay

## 2021-03-07 DIAGNOSIS — R32 Unspecified urinary incontinence: Secondary | ICD-10-CM | POA: Diagnosis not present

## 2021-03-07 NOTE — Progress Notes (Signed)
PTNS  Session #  Monthly maintenance  Health & Social Factors: None Caffeine: One cup Alcohol: None Daytime voids #per day: 4 to 5 Night-time voids #per night: 6 to 7 Urgency: N/A Incontinence Episodes #per day: If I leak Ankle used: Left Treatment Setting: 10 Feeling/ Response: Feels good there if it lasts Comments: None  Performed By: Valentina Lucks.LPN     Follow Up: 1 month

## 2021-03-09 DIAGNOSIS — N182 Chronic kidney disease, stage 2 (mild): Secondary | ICD-10-CM | POA: Diagnosis not present

## 2021-03-21 ENCOUNTER — Other Ambulatory Visit: Payer: Self-pay

## 2021-03-21 ENCOUNTER — Ambulatory Visit (INDEPENDENT_AMBULATORY_CARE_PROVIDER_SITE_OTHER): Payer: Medicare HMO

## 2021-03-21 DIAGNOSIS — R32 Unspecified urinary incontinence: Secondary | ICD-10-CM | POA: Diagnosis not present

## 2021-03-21 NOTE — Progress Notes (Signed)
PTNS  Session # monthly  Health & Social Factors: Diabetes Caffeine: none Alcohol: none Daytime voids #per day: one Night-time voids #per night: 4 Urgency: yes Incontinence Episodes #per day: sometimes Ankle used: right Treatment Setting: 8 Feeling/ Response: tingling sensation Comments:   Performed By: Lurline Hare  Assistant: none  Follow Up: 1 month NV

## 2021-04-04 DIAGNOSIS — B9689 Other specified bacterial agents as the cause of diseases classified elsewhere: Secondary | ICD-10-CM | POA: Diagnosis not present

## 2021-04-04 DIAGNOSIS — L02229 Furuncle of trunk, unspecified: Secondary | ICD-10-CM | POA: Diagnosis not present

## 2021-04-18 ENCOUNTER — Ambulatory Visit: Payer: Medicare HMO

## 2021-04-19 ENCOUNTER — Ambulatory Visit (INDEPENDENT_AMBULATORY_CARE_PROVIDER_SITE_OTHER): Payer: Medicare HMO

## 2021-04-19 ENCOUNTER — Other Ambulatory Visit: Payer: Self-pay

## 2021-04-19 DIAGNOSIS — N3281 Overactive bladder: Secondary | ICD-10-CM

## 2021-04-19 NOTE — Progress Notes (Signed)
PTNS  Session # monthly  Health & Social Factors: fluid pills Caffeine: none Alcohol: none Daytime voids #per day: 3 Night-time voids #per night: 4-5 Urgency: sometimes Incontinence Episodes #per day: none Ankle used: left Treatment Setting: 19 per patient request Feeling/ Response: positive heel to toe sensation with toe curl Comments: none  Performed By: Durenda Guthrie, LPN  Assistant: Estill Bamberg, RN  Follow Up: Keep next scheduled NV

## 2021-04-19 NOTE — Patient Instructions (Addendum)
Percutaneous Nerve Evaluation for Sacral Nerve Stimulation  Sacral nerve stimulation (SNS) is a treatment that uses an implanted device that sends mild electrical impulses to the sacral nerves. The sacral nerves control several functions in the lower part of the body, including bladder and bowel functions. Sacral nerve stimulation can be used to treat disorders that make it hard to control urination and bowel movements. Before having a sacral nerve stimulator implanted, you may go through a trial known as a percutaneous nerve evaluation. This trial helps determine if SNS will help your condition. For the trial, an outpatient procedure will be done to insert temporary wires into your body. Electric pulses will travel through these wires (electrodes) to an area close to your sacral nerves. The electrodes are connected to the temporary nerve stimulator, which remains outside of your body. The nerve stimulator will send electric pulses during the trial period, which usually lasts 1-2 weeks. Tell a health care provider about:  Any allergies you have.  All medicines you are taking, including vitamins, herbs, eye drops, creams, and over-the-counter medicines.  Any problems you or family members have had with anesthetic medicines.  Any blood disorders you have.  Any surgeries you have had.  Any medical conditions you have.  Whether you are pregnant or may be pregnant. What are the risks? Generally, this is a safe procedure. However, problems may occur, including:  Movement of the electrode away from the place where it was inserted (migration).  Infection.  Bleeding.  Damage to nearby structures or organs, such as nerves near the spine.  Uncomfortable sensations, such as a jolting or shocking feeling.  Allergic reactions to medicines. What happens before the procedure? Medicines Ask your health care provider about:  Changing or stopping your regular medicines. This is especially important  if you are taking diabetes medicines or blood thinners.  Taking medicines such as aspirin and ibuprofen. These medicines can thin your blood. Do not take these medicines unless your health care provider tells you to take them.  Taking over-the-counter medicines, vitamins, herbs, and supplements. General instructions  Follow instructions from your health care provider about eating or drinking restrictions.  Ask your health care provider what steps will be taken to help prevent infection. These steps may include: ? Removing hair at the procedure site. ? Washing skin with a germ-killing soap. ? Taking antibiotic medicine. What happens during the procedure?  You will be given a medicine to numb the area (local anesthetic).  Long needles will be inserted into your lower back.  The needles will be guided to the place where the nerves exit the backbone. Your health care provider may use a type of X-ray (fluoroscopy) to guide the needles to the right spot.  The position of the needles will be tested. If they are in the right spot, your toes or feet may move. If you are awake, you may feel a tingling in your legs.  Electrodes will be inserted through the needles and into your body.  The electrodes will be anchored in place close to your sacral nerves.  The ends of the electrodes outside your body will be connected to a nerve stimulator device.  A bandage (dressing) will be applied to cover the electrodes.  Your health care provider will program the rate at which the nerve stimulator will deliver the electric pulses. The procedure may vary among health care providers and hospitals. What happens after the procedure?  Your blood pressure, heart rate, breathing rate, and blood oxygen   level will be monitored until you leave the hospital or clinic.  You will wear the sacral nerve stimulator for the trial period. You will be taught how to use the device.  You may be given pain medicine as  needed. Summary  Sacral nerve stimulation (SNS) is a treatment that uses an implanted device that sends mild electrical impulses to the sacral nerves.  This procedure will determine if a sacral nerve stimulator will help you. If so, you will need to have a second procedure to have a permanent device implanted.  The sacral nerves control several functions in the lower part of the body, including bladder and bowel functions.  SNS can be used to treat disorders that make it hard to control urination and bowel movements. This information is not intended to replace advice given to you by your health care provider. Make sure you discuss any questions you have with your health care provider. Document Revised: 06/04/2020 Document Reviewed: 06/04/2020 Elsevier Patient Education  2021 Elsevier Inc.  

## 2021-05-11 DIAGNOSIS — N182 Chronic kidney disease, stage 2 (mild): Secondary | ICD-10-CM | POA: Diagnosis not present

## 2021-05-19 ENCOUNTER — Ambulatory Visit: Payer: Medicare HMO

## 2021-05-24 ENCOUNTER — Ambulatory Visit (INDEPENDENT_AMBULATORY_CARE_PROVIDER_SITE_OTHER): Payer: Medicare HMO

## 2021-05-24 ENCOUNTER — Other Ambulatory Visit: Payer: Self-pay

## 2021-05-24 DIAGNOSIS — N3281 Overactive bladder: Secondary | ICD-10-CM | POA: Diagnosis not present

## 2021-05-24 NOTE — Progress Notes (Signed)
PTNS  Session # monthly  Health & Social Factors: fluid pills Caffeine: 0 Alcohol: 0 Daytime voids #per day: 5 or more Night-time voids #per night: 6 or more Urgency: none Incontinence Episodes #per day: ? Ankle used: right Treatment Setting: 4 Feeling/ Response: positive sensation from heel to toe Comments: n/a  Performed By: Durenda Guthrie, LPN  Assistant: none  Follow Up: keep next scheduled

## 2021-06-13 ENCOUNTER — Other Ambulatory Visit: Payer: Self-pay

## 2021-06-13 ENCOUNTER — Ambulatory Visit (INDEPENDENT_AMBULATORY_CARE_PROVIDER_SITE_OTHER): Payer: Medicare HMO

## 2021-06-13 DIAGNOSIS — R32 Unspecified urinary incontinence: Secondary | ICD-10-CM

## 2021-06-13 NOTE — Progress Notes (Signed)
PTNS  Session # monthly  [place] takes fluid pill Caffeine: none Alcohol: none Daytime voids #per day: 3-5 Night-time voids #per night: 3-5 Urgency: yes Incontinence Episodes #per day: 2 Ankle used: right Treatment Setting: 10 Feeling/ Response: heel throbbing Comments: none  Performed By: Estill Bamberg RN  Assistant: none  Follow Up: 1 month PTNS

## 2021-07-11 ENCOUNTER — Ambulatory Visit: Payer: Medicare HMO

## 2021-07-14 ENCOUNTER — Other Ambulatory Visit: Payer: Self-pay

## 2021-07-14 ENCOUNTER — Ambulatory Visit (INDEPENDENT_AMBULATORY_CARE_PROVIDER_SITE_OTHER): Payer: Medicare HMO

## 2021-07-14 DIAGNOSIS — R32 Unspecified urinary incontinence: Secondary | ICD-10-CM

## 2021-07-14 NOTE — Progress Notes (Signed)
PTNS  Session # monthly  Health & Social Factors: none Caffeine: 1 cup Alcohol: none Daytime voids #per day: varies Night-time voids #per night: varies Urgency: yes  Incontinence Episodes #per day: 2 Ankle used: right Treatment Setting: 8 Feeling/ Response: positive  sensation from heel to toe Comments: n/a  Performed By: [facility] LPN  Assistant: none  Follow Up: keep next scheduled NV

## 2021-07-19 ENCOUNTER — Other Ambulatory Visit: Payer: Medicare HMO

## 2021-07-19 ENCOUNTER — Other Ambulatory Visit: Payer: Self-pay

## 2021-07-19 DIAGNOSIS — C61 Malignant neoplasm of prostate: Secondary | ICD-10-CM | POA: Diagnosis not present

## 2021-07-20 LAB — PSA: Prostate Specific Ag, Serum: 0.6 ng/mL (ref 0.0–4.0)

## 2021-07-21 DIAGNOSIS — Z13220 Encounter for screening for lipoid disorders: Secondary | ICD-10-CM | POA: Diagnosis not present

## 2021-07-21 DIAGNOSIS — Z6841 Body Mass Index (BMI) 40.0 and over, adult: Secondary | ICD-10-CM | POA: Diagnosis not present

## 2021-07-21 DIAGNOSIS — G894 Chronic pain syndrome: Secondary | ICD-10-CM | POA: Diagnosis not present

## 2021-07-21 DIAGNOSIS — Z23 Encounter for immunization: Secondary | ICD-10-CM | POA: Diagnosis not present

## 2021-07-21 DIAGNOSIS — I10 Essential (primary) hypertension: Secondary | ICD-10-CM | POA: Diagnosis not present

## 2021-07-21 DIAGNOSIS — E119 Type 2 diabetes mellitus without complications: Secondary | ICD-10-CM | POA: Diagnosis not present

## 2021-07-21 DIAGNOSIS — D472 Monoclonal gammopathy: Secondary | ICD-10-CM | POA: Diagnosis not present

## 2021-07-25 ENCOUNTER — Encounter: Payer: Self-pay | Admitting: Urology

## 2021-07-25 ENCOUNTER — Ambulatory Visit (INDEPENDENT_AMBULATORY_CARE_PROVIDER_SITE_OTHER): Payer: Medicare HMO | Admitting: Urology

## 2021-07-25 ENCOUNTER — Other Ambulatory Visit: Payer: Self-pay

## 2021-07-25 VITALS — BP 116/82 | HR 90

## 2021-07-25 DIAGNOSIS — N138 Other obstructive and reflux uropathy: Secondary | ICD-10-CM | POA: Diagnosis not present

## 2021-07-25 DIAGNOSIS — N401 Enlarged prostate with lower urinary tract symptoms: Secondary | ICD-10-CM

## 2021-07-25 DIAGNOSIS — C61 Malignant neoplasm of prostate: Secondary | ICD-10-CM

## 2021-07-25 DIAGNOSIS — N3281 Overactive bladder: Secondary | ICD-10-CM

## 2021-07-25 LAB — URINALYSIS, ROUTINE W REFLEX MICROSCOPIC
Bilirubin, UA: NEGATIVE
Glucose, UA: NEGATIVE
Ketones, UA: NEGATIVE
Leukocytes,UA: NEGATIVE
Nitrite, UA: NEGATIVE
Protein,UA: NEGATIVE
RBC, UA: NEGATIVE
Specific Gravity, UA: 1.02 (ref 1.005–1.030)
Urobilinogen, Ur: 0.2 mg/dL (ref 0.2–1.0)
pH, UA: 6 (ref 5.0–7.5)

## 2021-07-25 MED ORDER — FESOTERODINE FUMARATE ER 8 MG PO TB24: 8.0000 mg | ORAL_TABLET | Freq: Every day | ORAL | 11 refills | Status: DC

## 2021-07-25 MED ORDER — GEMTESA 75 MG PO TABS: 1.0000 | ORAL_TABLET | Freq: Every day | ORAL | 0 refills | Status: DC

## 2021-07-25 NOTE — Progress Notes (Signed)
07/25/2021 9:12 AM   Gregory Faster Derenzo Sr. 1951-09-18 [id number]  Referring provider: Sharilyn Sites, MD [address] [place],  [place] [address]  Followup OAB   HPI: Mr Pate is a 70yo here for followup for OAB and prostate cancer. PSA stable at 0.6. He is currently on maintenance PTNS which has improved his urinary incontinence. He remains on [place] '8mg'$  daily. He has daily urgency which is bothersome.    PMH: Past Medical History:  Diagnosis Date   Abnormal SPEP 09/09/2015   Anxiety    Arthritis    DDD, low back   Early cataracts, bilateral    Enlarged prostate    Heart murmur    was told at age 21, but has not had any problems   Hypertension    MGUS (monoclonal gammopathy of unknown significance) 01/29/2016   Prostate cancer [facility])    Renal disorder     Surgical History: Past Surgical History:  Procedure Laterality Date   BACK SURGERY  2004, 2015   COLONOSCOPY     EYE SURGERY     L eye- as a child    FRACTURE SURGERY Right    thumb   HEMORROIDECTOMY     HERNIA REPAIR     umbilical hernia repair   MASS EXCISION Right 07/11/2016   Procedure: EXCISION 6CM RIGHT SHOULDER MASS;  Surgeon: Vickie Epley, MD;  Location: [facility];  Service: Vascular;  Laterality: Right;   PROSTATE BIOPSY     TONSILLECTOMY     WOUND DEBRIDEMENT N/A 10/25/2018   Procedure: DEBRIDEMENT OF MUSCLE AND FASCIA AT UMBILICUS;  Surgeon: Virl Cagey, MD;  Location: [facility];  Service: General;  Laterality: N/A;    Home Medications:  Allergies as of 07/25/2021       Reactions   Penicillins Swelling, Other (See Comments)   Has patient had a PCN reaction causing immediate rash, facial/tongue/throat swelling, SOB or lightheadedness with hypotension: Yes Has patient had a PCN reaction causing severe rash involving mucus membranes or skin necrosis: No Has patient had a PCN reaction that required hospitalization No Has patient had a PCN reaction occurring within the last 10 years: No If  all of the above answers are "NO", then may proceed with Cephalosporin use.        Medication List        Accurate as of July 25, 2021  9:12 AM. If you have any questions, ask your nurse or doctor.          alfuzosin 10 MG 24 hr tablet Commonly known as: UROXATRAL Take 1 tablet (10 mg total) by mouth in the morning and at bedtime.   doxycycline 100 MG capsule Commonly known as: VIBRAMYCIN Take 1 capsule (100 mg total) by mouth every 12 (twelve) hours.   gabapentin 300 MG capsule Commonly known as: NEURONTIN Take 300 mg by mouth daily as needed (pain/neuropathy).   Januvia 100 MG tablet Generic drug: sitaGLIPtin Take 100 mg by mouth daily.   olmesartan 40 MG tablet Commonly known as: BENICAR Take 40 mg by mouth daily.   oxyCODONE-acetaminophen 5-325 MG tablet Commonly known as: PERCOCET/ROXICET One tab every 4-6 hours PRN   tolterodine 4 MG 24 hr capsule Commonly known as: Detrol LA Take 1 capsule (4 mg total) by mouth daily.   Toviaz 8 MG Tb24 tablet Generic drug: fesoterodine Take 1 tablet (8 mg total) by mouth daily.   traZODone 50 MG tablet Commonly known as: DESYREL Take 50 mg by mouth at  bedtime as needed for sleep.   valACYclovir 500 MG tablet Commonly known as: VALTREX Take 500 mg by mouth daily as needed (for fever blister/cold sores.).   zolpidem 10 MG tablet Commonly known as: AMBIEN Take 5-10 mg by mouth at bedtime.        Allergies:  Allergies  Allergen Reactions   Penicillins Swelling and Other (See Comments)    Has patient had a PCN reaction causing immediate rash, facial/tongue/throat swelling, SOB or lightheadedness with hypotension: Yes Has patient had a PCN reaction causing severe rash involving mucus membranes or skin necrosis: No Has patient had a PCN reaction that required hospitalization No Has patient had a PCN reaction occurring within the last 10 years: No If all of the above answers are "NO", then may proceed with  Cephalosporin use.     Family History: Family History  Problem Relation Age of Onset   Gallstones Father    Ulcers Father    Alcohol abuse Father    Diabetes Daughter    Diabetes Brother    Diabetes Sister    Breast cancer Sister     Social History:  reports that he has been smoking cigarettes. He has a 25.00 pack-year smoking history. He has never used smokeless tobacco. He reports that he does not drink alcohol and does not use drugs.  ROS: All other review of systems were reviewed and are negative except what is noted above in HPI  Physical Exam: BP 116/82   Pulse 90   Constitutional:  Alert and oriented, No acute distress. HEENT: [place] AT, moist mucus membranes.  Trachea midline, no masses. Cardiovascular: No clubbing, cyanosis, or edema. Respiratory: Normal respiratory effort, no increased work of breathing. GI: Abdomen is soft, nontender, nondistended, no abdominal masses GU: No CVA tenderness.  Lymph: No cervical or inguinal lymphadenopathy. Skin: No rashes, bruises or suspicious lesions. Neurologic: Grossly intact, no focal deficits, moving all 4 extremities. Psychiatric: Normal mood and affect.  Laboratory Data: Lab Results  Component Value Date   WBC 8.7 05/23/2020   HGB 15.2 05/23/2020   HCT 47.9 05/23/2020   MCV 86.5 05/23/2020   PLT 237 05/23/2020    Lab Results  Component Value Date   CREATININE 1.29 (H) 05/23/2020    No results found for: PSA  No results found for: TESTOSTERONE  Lab Results  Component Value Date   HGBA1C 5.5 10/16/2018    Urinalysis    Component Value Date/Time   COLORURINE AMBER (A) 05/23/2020 1846   APPEARANCEUR Clear 01/19/2021 1649   LABSPEC 1.029 05/23/2020 1846   PHURINE 5.0 05/23/2020 1846   GLUCOSEU Negative 01/19/2021 1649   HGBUR NEGATIVE 05/23/2020 1846   BILIRUBINUR Negative 01/19/2021 1649   KETONESUR NEGATIVE 05/23/2020 1846   PROTEINUR Negative 01/19/2021 1649   PROTEINUR 100 (A) 05/23/2020 1846    UROBILINOGEN 0.2 05/12/2020 0917   UROBILINOGEN 0.2 08/22/2015 1609   NITRITE Negative 01/19/2021 1649   NITRITE NEGATIVE 05/23/2020 1846   LEUKOCYTESUR Negative 01/19/2021 1649   LEUKOCYTESUR NEGATIVE 05/23/2020 1846    Lab Results  Component Value Date   LABMICR See below: 01/19/2021   WBCUA None seen 01/19/2021   LABEPIT 0-10 01/19/2021   BACTERIA Few 01/19/2021    Pertinent Imaging:  No results found for this or any previous visit.  No results found for this or any previous visit.  No results found for this or any previous visit.  No results found for this or any previous visit.  Results for orders placed  during the hospital encounter of 08/22/15  US Renal  Narrative CLINICAL DATA:  Acute kidney injury  EXAM: RENAL / URINARY TRACT ULTRASOUND COMPLETE  COMPARISON:  None.  FINDINGS: Right Kidney:  Length: 15.6 cm. Numerous cysts in the upper and midpole all measuring less than or equal to 5 cm. No hydronephrosis. Normal echogenicity.  Left Kidney:  Length: 15.1 cm. Two lower pole cysts 1 measuring 4.4 cm in the other measuring 3.6 cm. No hydronephrosis. Normal echogenicity.  Bladder:  Appears normal for degree of bladder distention.  IMPRESSION: Bilateral simple cysts.  No acute abnormalities.   Electronically Signed By: Skipper Cliche M.D. On: 08/24/2015 08:28  No results found for this or any previous visit.  No results found for this or any previous visit.  No results found for this or any previous visit.   Assessment & Plan:    1. OAB (overactive bladder) -Continue toviaz '8mg'$ , we will add gemtesa '75mg'$  in the morning  2. Prostate cancer ([place]) -RTC 6 months with PSA  3. Benign prostatic hyperplasia with urinary obstruction -Continue toviaz '8mg'$  daily - Urinalysis, Routine w reflex microscopic   No follow-ups on file.  Nicolette Bang, MD  [facility] [place]

## 2021-07-25 NOTE — Progress Notes (Signed)
Urological Symptom Review  Patient is experiencing the following symptoms: Weak stream Penile pain (male only)    Review of Systems  Gastrointestinal (upper)  : Nausea  Gastrointestinal (lower) : Negative for lower GI symptoms  Constitutional : Negative for symptoms  Skin: Negative for skin symptoms  Eyes: Negative for eye symptoms  Ear/Nose/Throat : Negative for Ear/Nose/Throat symptoms  Hematologic/Lymphatic: Negative for Hematologic/Lymphatic symptoms  Cardiovascular : Leg swelling  Respiratory : Negative for respiratory symptoms  Endocrine: Negative for endocrine symptoms  Musculoskeletal: Back pain Joint pain  Neurological: Headaches  Psychologic: Negative for psychiatric symptoms

## 2021-07-25 NOTE — Patient Instructions (Signed)
Prostate Cancer °The prostate is a small gland that helps make semen. It is located below a man's bladder, in front of the rectum. Prostate cancer is when abnormal cells grow in this gland. °What are the causes? °The cause of this condition is not known. °What increases the risk? °Being age 72 or older. °Having a family history of prostate cancer. °Having a family history of cancer of the breasts or ovaries. °Having genes that are passed from parent to child (inherited). °Having Lynch syndrome. °African American men and men of African descent are diagnosed with prostate cancer at higher rates than other men. °What are the signs or symptoms? °Problems peeing (urinating). This may include: °A stream that is weak, or pee that stops and starts. °Trouble starting or stopping your pee. °Trouble emptying all of your pee. °Needing to pee more often, especially at night. °Blood in your pee or semen. °Pain in the: °Lower back. °Lower belly (abdomen). °Hips. °Trouble getting an erection. °Weakness or numbness in the legs or feet. °How is this treated? °Treatment for this condition depends on: °How much the cancer has spread. °Your age. °The kind of treatment you want. °Your health. °Treatments include: °Being watched. This is called observation. You will be tested from time to time, but you will not get treated. Tests are to make sure that the cancer is not growing. °Surgery. This may be done to: °Take out (remove) the prostate. °Freeze and kill cancer cells. °Radiation. This uses a strong beam of energy to kill cancer cells. °Chemotherapy. This uses medicines that stop cancer cells from increasing. This kills cancer cells and healthy cells. °Targeted therapy. This kills cancer cells only. Healthy cells are not affected. °Hormone treatment. This stops the body from making hormones that help the cancer cells grow. °Follow these instructions at home: °Lifestyle °Do not smoke or use any products that contain nicotine or tobacco.  If you need help quitting, ask your doctor. °Eat a healthy diet. °Treatment may affect your ability to have sex. If you have a partner, touch, hold, hug, and caress your partner to have intimate moments. °Get plenty of sleep. °Ask your doctor for help to find a support group for men with prostate cancer. °General instructions °Take over-the-counter and prescription medicines only as told by your doctor. °If you have to go to the hospital, let your cancer doctor (oncologist) know. °Keep all follow-up visits. °Where to find more information °American Cancer Society: www.cancer.org °American Society of Clinical Oncology: www.cancer.net °National Cancer Institute: www.cancer.gov °Contact a doctor if: °You have new or more trouble peeing. °You have new or more blood in your pee. °You have new or more pain in your hips, back, or chest. °Get help right away if: °You have weakness in your legs. °You lose feeling in your legs. °You cannot control your pee or your poop (stool). °You have chills or a fever. °Summary °The prostate is a male gland that helps make semen. °Prostate cancer is when abnormal cells grow in this gland. °Treatment includes doing surgery, using medicines, using strong beams of energy, or watching without treatment. °Ask your doctor for help to find a support group for men with prostate cancer. °Contact a doctor if you have problems peeing or have any new pain that you did not have before. °This information is not intended to replace advice given to you by your health care provider. Make sure you discuss any questions you have with your health care provider. °Document Revised: 01/26/2021 Document Reviewed: 01/26/2021 °Elsevier   Patient Education © 2022 [employer] Inc. ° °

## 2021-08-03 DIAGNOSIS — D472 Monoclonal gammopathy: Secondary | ICD-10-CM | POA: Diagnosis not present

## 2021-08-03 DIAGNOSIS — E785 Hyperlipidemia, unspecified: Secondary | ICD-10-CM | POA: Diagnosis not present

## 2021-08-03 DIAGNOSIS — G894 Chronic pain syndrome: Secondary | ICD-10-CM | POA: Diagnosis not present

## 2021-08-03 DIAGNOSIS — I10 Essential (primary) hypertension: Secondary | ICD-10-CM | POA: Diagnosis not present

## 2021-08-03 DIAGNOSIS — E119 Type 2 diabetes mellitus without complications: Secondary | ICD-10-CM | POA: Diagnosis not present

## 2021-08-08 ENCOUNTER — Other Ambulatory Visit: Payer: Self-pay

## 2021-08-08 ENCOUNTER — Ambulatory Visit (INDEPENDENT_AMBULATORY_CARE_PROVIDER_SITE_OTHER): Payer: Medicare HMO

## 2021-08-08 DIAGNOSIS — N3281 Overactive bladder: Secondary | ICD-10-CM

## 2021-08-08 NOTE — Progress Notes (Signed)
PTNS  Session # 20 fo 45 monthly  Health & Social Factors: yes and no per patient- takes fluid pill Caffeine: no Alcohol: none Daytime voids #per day: once per pt Night-time voids #per night: 3 Urgency: sometimes Incontinence Episodes #per day: 2 times Ankle used: left Treatment Setting: left Feeling/ Response: heel throbbing Comments: none  Performed By: Estill Bamberg RN  Assistant:   Follow Up: 1 month PTNS

## 2021-08-11 DIAGNOSIS — B9689 Other specified bacterial agents as the cause of diseases classified elsewhere: Secondary | ICD-10-CM | POA: Diagnosis not present

## 2021-08-11 DIAGNOSIS — L02224 Furuncle of groin: Secondary | ICD-10-CM | POA: Diagnosis not present

## 2021-09-05 ENCOUNTER — Other Ambulatory Visit: Payer: Self-pay

## 2021-09-05 ENCOUNTER — Ambulatory Visit (INDEPENDENT_AMBULATORY_CARE_PROVIDER_SITE_OTHER): Payer: Medicare HMO

## 2021-09-05 DIAGNOSIS — N3281 Overactive bladder: Secondary | ICD-10-CM

## 2021-09-05 DIAGNOSIS — N182 Chronic kidney disease, stage 2 (mild): Secondary | ICD-10-CM | POA: Diagnosis not present

## 2021-09-05 NOTE — Progress Notes (Signed)
PTNS  Session # 21 of 45  Health & Social Factors: none Caffeine: none Alcohol: none Daytime voids #per day: 3 or 4 Night-time voids #per night: 3-6 Urgency: sometimes Incontinence Episodes #per day: sometimes Ankle used: left Treatment Setting: 13 Feeling/ Response: pulsing per pt Comments: none  Performed By: Estill Bamberg RN  Follow Up: 1 month PTNS

## 2021-09-08 DIAGNOSIS — G894 Chronic pain syndrome: Secondary | ICD-10-CM | POA: Diagnosis not present

## 2021-09-08 DIAGNOSIS — Z0000 Encounter for general adult medical examination without abnormal findings: Secondary | ICD-10-CM | POA: Diagnosis not present

## 2021-09-08 DIAGNOSIS — Z024 Encounter for examination for driving license: Secondary | ICD-10-CM | POA: Diagnosis not present

## 2021-09-08 DIAGNOSIS — E119 Type 2 diabetes mellitus without complications: Secondary | ICD-10-CM | POA: Diagnosis not present

## 2021-09-08 DIAGNOSIS — Z1331 Encounter for screening for depression: Secondary | ICD-10-CM | POA: Diagnosis not present

## 2021-09-08 DIAGNOSIS — I10 Essential (primary) hypertension: Secondary | ICD-10-CM | POA: Diagnosis not present

## 2021-09-08 DIAGNOSIS — E785 Hyperlipidemia, unspecified: Secondary | ICD-10-CM | POA: Diagnosis not present

## 2021-09-08 DIAGNOSIS — Z6841 Body Mass Index (BMI) 40.0 and over, adult: Secondary | ICD-10-CM | POA: Diagnosis not present

## 2021-09-13 DIAGNOSIS — Z833 Family history of diabetes mellitus: Secondary | ICD-10-CM | POA: Diagnosis not present

## 2021-09-13 DIAGNOSIS — F17210 Nicotine dependence, cigarettes, uncomplicated: Secondary | ICD-10-CM | POA: Diagnosis not present

## 2021-09-13 DIAGNOSIS — N529 Male erectile dysfunction, unspecified: Secondary | ICD-10-CM | POA: Diagnosis not present

## 2021-09-13 DIAGNOSIS — N3941 Urge incontinence: Secondary | ICD-10-CM | POA: Diagnosis not present

## 2021-09-13 DIAGNOSIS — I10 Essential (primary) hypertension: Secondary | ICD-10-CM | POA: Diagnosis not present

## 2021-09-13 DIAGNOSIS — Z008 Encounter for other general examination: Secondary | ICD-10-CM | POA: Diagnosis not present

## 2021-09-13 DIAGNOSIS — Z8546 Personal history of malignant neoplasm of prostate: Secondary | ICD-10-CM | POA: Diagnosis not present

## 2021-09-13 DIAGNOSIS — Z6837 Body mass index (BMI) 37.0-37.9, adult: Secondary | ICD-10-CM | POA: Diagnosis not present

## 2021-09-13 DIAGNOSIS — Z880 Allergy status to penicillin: Secondary | ICD-10-CM | POA: Diagnosis not present

## 2021-09-13 DIAGNOSIS — R69 Illness, unspecified: Secondary | ICD-10-CM | POA: Diagnosis not present

## 2021-09-13 DIAGNOSIS — Z8249 Family history of ischemic heart disease and other diseases of the circulatory system: Secondary | ICD-10-CM | POA: Diagnosis not present

## 2021-10-03 ENCOUNTER — Ambulatory Visit: Payer: Medicare HMO

## 2021-10-05 ENCOUNTER — Telehealth: Payer: Self-pay

## 2021-10-05 NOTE — Telephone Encounter (Signed)
Patient had left voicemail of sudden urinary incontinence and requesting stronger medication.  Per patient chart currently on Toviaz 8mg  daily with Gemtesa 75mg  daily. Reviewed with Dr. Alyson Ingles who stated patient needs to come in for PVR/UA check. Returned call to patient. No answer. Left message to return call to office.

## 2021-10-31 ENCOUNTER — Telehealth: Payer: Self-pay

## 2021-10-31 DIAGNOSIS — N182 Chronic kidney disease, stage 2 (mild): Secondary | ICD-10-CM | POA: Diagnosis not present

## 2021-10-31 DIAGNOSIS — N3281 Overactive bladder: Secondary | ICD-10-CM

## 2021-10-31 NOTE — Telephone Encounter (Signed)
Patient dropped off [place] information regarding medication coverage for 2023.  Wanted to know what Dr. Alyson Ingles suggested for him to take.  Left in basket in front office.   Pt also has question about needing an appt sooner than March.   Please advise.    Thanks, Gregory Diaz

## 2021-11-01 NOTE — Telephone Encounter (Signed)
Patient states insurance will no longer cover Toviaz. Insurance suggest alternatives such as Oxybutynin or Myrbetriq. Please advise.

## 2021-11-02 MED ORDER — TROSPIUM CHLORIDE ER 60 MG PO CP24: 60.0000 mg | ORAL_CAPSULE | Freq: Every day | ORAL | 11 refills | Status: DC

## 2021-11-02 NOTE — Telephone Encounter (Signed)
Rx for Trospium sent to pharmacy.  Patient called and made aware.

## 2021-12-12 DIAGNOSIS — N182 Chronic kidney disease, stage 2 (mild): Secondary | ICD-10-CM | POA: Diagnosis not present

## 2022-01-23 ENCOUNTER — Other Ambulatory Visit: Payer: Self-pay

## 2022-01-23 ENCOUNTER — Other Ambulatory Visit: Payer: Medicare HMO

## 2022-01-23 DIAGNOSIS — C61 Malignant neoplasm of prostate: Secondary | ICD-10-CM

## 2022-01-24 LAB — PSA: Prostate Specific Ag, Serum: 0.5 ng/mL (ref 0.0–4.0)

## 2022-01-30 ENCOUNTER — Ambulatory Visit: Payer: Medicare HMO | Admitting: Urology

## 2022-01-31 NOTE — Progress Notes (Signed)
Letter sent.

## 2022-02-01 ENCOUNTER — Encounter: Payer: Self-pay | Admitting: Urology

## 2022-02-01 ENCOUNTER — Ambulatory Visit: Payer: Medicare HMO | Admitting: Urology

## 2022-02-01 ENCOUNTER — Other Ambulatory Visit: Payer: Self-pay

## 2022-02-01 VITALS — BP 122/83 | HR 38 | Ht 74.0 in | Wt 280.0 lb

## 2022-02-01 DIAGNOSIS — C61 Malignant neoplasm of prostate: Secondary | ICD-10-CM | POA: Diagnosis not present

## 2022-02-01 DIAGNOSIS — N3281 Overactive bladder: Secondary | ICD-10-CM

## 2022-02-01 DIAGNOSIS — N138 Other obstructive and reflux uropathy: Secondary | ICD-10-CM

## 2022-02-01 DIAGNOSIS — R351 Nocturia: Secondary | ICD-10-CM | POA: Diagnosis not present

## 2022-02-01 DIAGNOSIS — N401 Enlarged prostate with lower urinary tract symptoms: Secondary | ICD-10-CM | POA: Diagnosis not present

## 2022-02-01 LAB — URINALYSIS, ROUTINE W REFLEX MICROSCOPIC
Bilirubin, UA: NEGATIVE
Glucose, UA: NEGATIVE
Ketones, UA: NEGATIVE
Leukocytes,UA: NEGATIVE
Nitrite, UA: NEGATIVE
Protein,UA: NEGATIVE
Specific Gravity, UA: 1.025 (ref 1.005–1.030)
Urobilinogen, Ur: 0.2 mg/dL (ref 0.2–1.0)
pH, UA: 6 (ref 5.0–7.5)

## 2022-02-01 LAB — MICROSCOPIC EXAMINATION
Bacteria, UA: NONE SEEN
Epithelial Cells (non renal): NONE SEEN /hpf (ref 0–10)
RBC, Urine: NONE SEEN /hpf (ref 0–2)
Renal Epithel, UA: NONE SEEN /hpf
WBC, UA: NONE SEEN /hpf (ref 0–5)

## 2022-02-01 MED ORDER — ALFUZOSIN HCL ER 10 MG PO TB24: 10.0000 mg | ORAL_TABLET | Freq: Two times a day (BID) | ORAL | 3 refills | Status: DC

## 2022-02-01 NOTE — Progress Notes (Signed)
02/01/2022 10:04 AM   Gregory Pu Geerts Sr. Jan 16, 1951 [id number]  Referring provider: Assunta Found, MD [address] [place],  [place] [address]  Followup OAB, BPH, and Prostate cancer   HPI: [name] is a 71yo here for followup for Prostate cancer, BPH and OAB. He is on maintanence PTNS which works but it wears off and his incontinence worsens. He was on [place] (Gregory Diaz) and then it was switched to sanctura due to cost. He has bothersome urinary urgency, urge incontinence and urinary frequency. IPSS 14 QOl 4 on uroxatral 10mg  qhs. Nocturia 2-3x. Urine stream strong. No straining to urinate.    PMH: Past Medical History:  Diagnosis Date   Abnormal SPEP 09/09/2015   Anxiety    Arthritis    DDD, low back   Early cataracts, bilateral    Enlarged prostate    Heart murmur    was told at age 71, but has not had any problems   Hypertension    MGUS (monoclonal gammopathy of unknown significance) 01/29/2016   Prostate cancer [facility])    Renal disorder     Surgical History: Past Surgical History:  Procedure Laterality Date   BACK SURGERY  2004, 2015   COLONOSCOPY     EYE SURGERY     L eye- as a child    FRACTURE SURGERY Right    thumb   HEMORROIDECTOMY     HERNIA REPAIR     umbilical hernia repair   MASS EXCISION Right 07/11/2016   Procedure: EXCISION 6CM RIGHT SHOULDER MASS;  Surgeon: Ancil Linsey, MD;  Location: [facility];  Service: Vascular;  Laterality: Right;   PROSTATE BIOPSY     TONSILLECTOMY     WOUND DEBRIDEMENT N/A 10/25/2018   Procedure: DEBRIDEMENT OF MUSCLE AND FASCIA AT UMBILICUS;  Surgeon: Lucretia Roers, MD;  Location: [facility];  Service: General;  Laterality: N/A;    Home Medications:  Allergies as of 02/01/2022       Reactions   Penicillins Swelling, Other (See Comments)   Has patient had a PCN reaction causing immediate rash, facial/tongue/throat swelling, SOB or lightheadedness with hypotension: Yes Has patient had a PCN reaction causing severe rash  involving mucus membranes or skin necrosis: No Has patient had a PCN reaction that required hospitalization No Has patient had a PCN reaction occurring within the last 10 years: No If all of the above answers are "NO", then may proceed with Cephalosporin use.        Medication List        Accurate as of February 01, 2022 10:04 AM. If you have any questions, ask your nurse or doctor.          alfuzosin 10 MG 24 hr tablet Commonly known as: UROXATRAL Take 1 tablet (10 mg total) by mouth in the morning and at bedtime.   doxycycline 100 MG capsule Commonly known as: VIBRAMYCIN Take 1 capsule (100 mg total) by mouth every 12 (twelve) hours.   gabapentin 300 MG capsule Commonly known as: NEURONTIN Take 300 mg by mouth daily as needed (pain/neuropathy).   Gemtesa 75 MG Tabs Generic drug: Vibegron Take 1 capsule by mouth daily.   Januvia 100 MG tablet Generic drug: sitaGLIPtin Take 100 mg by mouth daily.   olmesartan 40 MG tablet Commonly known as: BENICAR Take 40 mg by mouth daily.   oxyCODONE-acetaminophen 5-325 MG tablet Commonly known as: PERCOCET/ROXICET One tab every 4-6 hours PRN   tolterodine 4 MG 24 hr capsule Commonly known as:  Detrol LA Take 1 capsule (4 mg total) by mouth daily.   traZODone 50 MG tablet Commonly known as: DESYREL Take 50 mg by mouth at bedtime as needed for sleep.   Trospium Chloride 60 MG Cp24 Take 1 capsule (60 mg total) by mouth daily.   valACYclovir 500 MG tablet Commonly known as: VALTREX Take 500 mg by mouth daily as needed (for fever blister/cold sores.).   zolpidem 10 MG tablet Commonly known as: AMBIEN Take 5-10 mg by mouth at bedtime.        Allergies:  Allergies  Allergen Reactions   Penicillins Swelling and Other (See Comments)    Has patient had a PCN reaction causing immediate rash, facial/tongue/throat swelling, SOB or lightheadedness with hypotension: Yes Has patient had a PCN reaction causing severe rash  involving mucus membranes or skin necrosis: No Has patient had a PCN reaction that required hospitalization No Has patient had a PCN reaction occurring within the last 10 years: No If all of the above answers are "NO", then may proceed with Cephalosporin use.     Family History: Family History  Problem Relation Age of Onset   Gallstones Father    Ulcers Father    Alcohol abuse Father    Diabetes Daughter    Diabetes Brother    Diabetes Sister    Breast cancer Sister     Social History:  reports that he has been smoking cigarettes. He has a 25.00 pack-year smoking history. He has never used smokeless tobacco. He reports that he does not drink alcohol and does not use drugs.  ROS: All other review of systems were reviewed and are negative except what is noted above in HPI  Physical Exam: BP 122/83   Pulse (!) 38   Ht 6\' 2"  (1.88 m)   Wt 280 lb (127 kg)   BMI 35.95 kg/m   Constitutional:  Alert and oriented, No acute distress. HEENT: [place] AT, moist mucus membranes.  Trachea midline, no masses. Cardiovascular: No clubbing, cyanosis, or edema. Respiratory: Normal respiratory effort, no increased work of breathing. GI: Abdomen is soft, nontender, nondistended, no abdominal masses GU: No CVA tenderness.  Lymph: No cervical or inguinal lymphadenopathy. Skin: No rashes, bruises or suspicious lesions. Neurologic: Grossly intact, no focal deficits, moving all 4 extremities. Psychiatric: Normal mood and affect.  Laboratory Data: Lab Results  Component Value Date   WBC 8.7 05/23/2020   HGB 15.2 05/23/2020   HCT 47.9 05/23/2020   MCV 86.5 05/23/2020   PLT 237 05/23/2020    Lab Results  Component Value Date   CREATININE 1.29 (H) 05/23/2020    No results found for: PSA  No results found for: TESTOSTERONE  Lab Results  Component Value Date   HGBA1C 5.5 10/16/2018    Urinalysis    Component Value Date/Time   COLORURINE AMBER (A) 05/23/2020 1846   APPEARANCEUR Clear  07/25/2021 0914   LABSPEC 1.029 05/23/2020 1846   PHURINE 5.0 05/23/2020 1846   GLUCOSEU Negative 07/25/2021 0914   HGBUR NEGATIVE 05/23/2020 1846   BILIRUBINUR Negative 07/25/2021 0914   KETONESUR NEGATIVE 05/23/2020 1846   PROTEINUR Negative 07/25/2021 0914   PROTEINUR 100 (A) 05/23/2020 1846   UROBILINOGEN 0.2 05/12/2020 0917   UROBILINOGEN 0.2 08/22/2015 1609   NITRITE Negative 07/25/2021 0914   NITRITE NEGATIVE 05/23/2020 1846   LEUKOCYTESUR Negative 07/25/2021 0914   LEUKOCYTESUR NEGATIVE 05/23/2020 1846    Lab Results  Component Value Date   LABMICR Comment 07/25/2021   WBCUA None  seen 01/19/2021   LABEPIT 0-10 01/19/2021   BACTERIA Few 01/19/2021    Pertinent Imaging:  No results found for this or any previous visit.  No results found for this or any previous visit.  No results found for this or any previous visit.  No results found for this or any previous visit.  Results for orders placed during the hospital encounter of 08/22/15  US Renal  Narrative CLINICAL DATA:  Acute kidney injury  EXAM: RENAL / URINARY TRACT ULTRASOUND COMPLETE  COMPARISON:  None.  FINDINGS: Right Kidney:  Length: 15.6 cm. Numerous cysts in the upper and midpole all measuring less than or equal to 5 cm. No hydronephrosis. Normal echogenicity.  Left Kidney:  Length: 15.1 cm. Two lower pole cysts 1 measuring 4.4 cm in the other measuring 3.6 cm. No hydronephrosis. Normal echogenicity.  Bladder:  Appears normal for degree of bladder distention.  IMPRESSION: Bilateral simple cysts.  No acute abnormalities.   Electronically Signed By: Esperanza Heir M.D. On: 08/24/2015 08:28  No results found for this or any previous visit.  No results found for this or any previous visit.  No results found for this or any previous visit.   Assessment & Plan:    1. OAB (overactive bladder) -We discussed interstim versus intravseical botox and the patient elects for  intravesical botox. Risks/benefits/alternatives discussed - Urinalysis, Routine w reflex microscopic  2. Prostate cancer (HCC) -RTC 6 months with PSA - Urinalysis, Routine w reflex microscopic  3. Benign prostatic hyperplasia with urinary obstruction -continue ruoxatral 10mg  qhs  4. Nocturia -continue sanctura   No follow-ups on file.  Wilkie Aye, MD  [facility] [place]

## 2022-02-01 NOTE — Patient Instructions (Signed)
Botulinum Toxin Bladder Injection ?A botulinum toxin bladder injection is a procedure to treat an overactive bladder. During the procedure, a drug called botulinum toxin is injected into the bladder through a long, thin needle. This drug relaxes the bladder muscles and reduces overactivity. ?You may need this procedure if your medicines are not working or you cannot take them. The procedure may be repeated as needed. The treatment is done once and it usually lasts for 6 months. Your health care provider will monitor you to see how well you respond. ?Tell a health care provider about: ?Any allergies you have. ?All medicines you are taking, including vitamins, herbs, eye drops, creams, and over-the-counter medicines. ?Any problems you or family members have had with anesthetic medicines. ?Any bleeding problems you have. ?Any surgeries you have had. ?Any medical conditions you have. ?Any previous reactions to a botulinum toxin injection. ?Any symptoms of urinary tract infection. These include chills, fever, a burning feeling when passing urine, and needing to pass urine often. ?Whether you are pregnant or may be pregnant. ?What are the risks? ?Generally this is a safe procedure. However, problems may occur, including: ?Not being able to pass urine. If this happens, you may need to have your bladder emptied with a thin tube (urinary catheter). ?Bleeding. ?Urinary tract infection. ?Allergic reaction to the botulinum toxin. ?Pain or burning when passing urine. ?Damage to nearby structures or organs. ?What happens before the procedure? ?When to stop eating and drinking ?Follow instructions from your health care provider about what you may eat and drink before your procedure. These may include: ?8 hours before the procedure ?Stop eating most foods. Do not eat meat, fried foods, or fatty foods. ?Eat only light foods, such as toast or crackers. ?All liquids are okay except energy drinks and alcohol. ?6 hours before the  procedure ?Stop eating. ?Drink only clear liquids, such as water, clear fruit juice, black coffee, plain tea, and sports drinks. ?Do not drink energy drinks or alcohol. ?2 hours before the procedure ?Stop drinking all liquids. ?You may be allowed to take medicines with small sips of water. ?If you do not follow your health care provider's instructions, your procedure may be delayed or canceled. ?Medicines ?Ask your health care provider about: ?Changing or stopping your regular medicines. This is especially important if you are taking diabetes medicines or blood thinners. ?Taking medicines such as aspirin and ibuprofen. These medicines can thin your blood. Do not take these medicines unless your health care provider tells you to take them. ?Taking over-the-counter medicines, vitamins, herbs, and supplements. ?General instructions ?Ask your health care provider what steps will be taken to help prevent infection. These steps may include: ?Removing hair at the procedure site. ?Washing skin with a germ-killing soap. ?Taking antibiotic medicine. ?If you will be going home right after the procedure, plan to have a responsible adult: ?Take you home from the hospital or clinic. You will not be allowed to drive. ?Care for you for the time you are told. ?What happens during the procedure? ? ?You will be asked to empty your bladder. ?An IV will be inserted into one of your veins. ?You will be given one or more of the following: ?A medicine to help you relax (sedative). ?A medicine to numb the area (local anesthetic). ?A medicine to make you fall asleep (general anesthetic). ?A long, thin scope called a cystoscope will be passed into your bladder through the part of the body that carries urine from your bladder (urethra). ?The cystoscope will  be used to fill your bladder with water. ?A long needle will be passed through the cystoscope and into the bladder. ?The botulinum toxin will be injected into your bladder. It may be  injected into multiple areas of your bladder. ?The cystoscope will be removed and your bladder will be emptied with a urinary catheter. ?The procedure may vary among health care providers and hospitals. ?What can I expect after the procedure? ?After your procedure, it is common to have: ?Blood-tinged urine. ?Burning or soreness when you pass urine. ?Follow these instructions at home: ?Medicines ?Take over-the-counter and prescription medicines only as told by your health care provider. ?If you were prescribed an antibiotic medicine, take it as told by your health care provider. Do not stop using the antibiotic even if you start to feel better. ?General instructions ? ?If you were given a sedative during the procedure, it can affect you for several hours. Do not drive or operate machinery until your health care provider says that it is safe. ?Drink enough fluid to keep your urine pale yellow. ?Return to your normal activities as told by your health care provider. Ask your health care provider what activities are safe for you. ?Keep all follow-up visits. ?Contact a health care provider if you have: ?A fever or chills. ?Blood-tinged urine for more than one day after your procedure. ?Worsening pain or burning when you pass urine. ?Pain or burning when passing urine for more than two days after your procedure. ?Trouble emptying your bladder. ?Get help right away if you: ?Have bright red blood in your urine. ?Are unable to pass urine. ?Summary ?A botulinum toxin bladder injection is a procedure to treat an overactive bladder. ?This is generally a safe procedure. However, problems may occur, including not being able to pass urine, bleeding, infection, pain, and an allergic reaction to the botulinum toxin. ?You will be told when to stop eating and drinking, and what medicines to change or stop. Follow instructions carefully. ?After the procedure, it is common to have blood in your urine and to have soreness or burning when  passing urine. ?Contact a health care provider if you have a fever, blood in your urine for more than a few days, or trouble passing urine. Get help right away if you have bright red blood in your urine, or if you are unable to pass urine. ?This information is not intended to replace advice given to you by your health care provider. Make sure you discuss any questions you have with your health care provider. ?Document Revised: 05/06/2021 Document Reviewed: 05/06/2021 ?Elsevier Patient Education ? [place]. ? ?

## 2022-02-07 ENCOUNTER — Telehealth: Payer: Self-pay

## 2022-02-07 NOTE — Telephone Encounter (Signed)
Left message for patient to return call to schedule surgery.  ?

## 2022-02-08 NOTE — Telephone Encounter (Signed)
Surgery scheduled with patient.

## 2022-02-13 DIAGNOSIS — L02224 Furuncle of groin: Secondary | ICD-10-CM | POA: Diagnosis not present

## 2022-02-13 DIAGNOSIS — Z79899 Other long term (current) drug therapy: Secondary | ICD-10-CM | POA: Diagnosis not present

## 2022-02-13 DIAGNOSIS — B9689 Other specified bacterial agents as the cause of diseases classified elsewhere: Secondary | ICD-10-CM | POA: Diagnosis not present

## 2022-03-06 DIAGNOSIS — N182 Chronic kidney disease, stage 2 (mild): Secondary | ICD-10-CM | POA: Diagnosis not present

## 2022-03-07 NOTE — Patient Instructions (Addendum)
? ? ? ? ? ? ? ? ? Gregory A Sentell Sr. ? 03/07/2022  ?  ? '@PREFPERIOPPHARMACY'$ @ ? ? Your procedure is scheduled on  03/13/2022. ? ? Report to [facility] at  0700 A.M. ? ? Call this number if you have problems the morning of surgery: ? [phone] ? ? Remember: ? Do not eat or drink after midnight. ?  ?  ? Take these medicines the morning of surgery with A SIP OF WATER  ? ?                                         None. ?  ? ? Do not wear jewelry, make-up or nail polish. ? Do not wear lotions, powders, or perfumes, or deodorant. ? Do not shave 48 hours prior to surgery.  Men may shave face and neck. ? Do not bring valuables to the hospital. ? [facility] is not responsible for any belongings or valuables. ? ?Contacts, dentures or bridgework may not be worn into surgery.  Leave your suitcase in the car.  After surgery it may be brought to your room. ? ?For patients admitted to the hospital, discharge time will be determined by your treatment team. ? ?Patients discharged the day of surgery will not be allowed to drive home and must have someone with them for 24 hours.  ? ? ?Special instructions:   DO NOT smoke tobacco or vape for 24 hours before your procedure. ? ?Please read over the following fact sheets that you were given. ?Coughing and Deep Breathing, Surgical Site Infection Prevention, Anesthesia Post-op Instructions, and Care and Recovery After Surgery ?  ? ? ? Botulinum Toxin Bladder Injection ? ?A botulinum toxin bladder injection is a procedure to treat an overactive bladder. During the procedure, a drug called botulinum toxin is injected into the bladder through a long, thin needle. This drug relaxes the bladder muscles and reduces overactivity. ?You may need this procedure if your medicines are not working or you cannot take them. The procedure may be repeated as needed. The treatment is done once and it usually lasts for 6 months. Your health care provider will monitor you to see how well you respond. ?Tell a  health care provider about: ?Any allergies you have. ?All medicines you are taking, including vitamins, herbs, eye drops, creams, and over-the-counter medicines. ?Any problems you or family members have had with anesthetic medicines. ?Any bleeding problems you have. ?Any surgeries you have had. ?Any medical conditions you have. ?Any previous reactions to a botulinum toxin injection. ?Any symptoms of urinary tract infection. These include chills, fever, a burning feeling when passing urine, and needing to pass urine often. ?Whether you are pregnant or may be pregnant. ?What are the risks? ?Generally this is a safe procedure. However, problems may occur, including: ?Not being able to pass urine. If this happens, you may need to have your bladder emptied with a thin tube (urinary catheter). ?Bleeding. ?Urinary tract infection. ?Allergic reaction to the botulinum toxin. ?Pain or burning when passing urine. ?Damage to nearby structures or organs. ?What happens before the procedure? ?When to stop eating and drinking ?Follow instructions from your health care provider about what you may eat and drink before your procedure. These may include: ?8 hours before the procedure ?Stop eating most foods. Do not eat meat, fried foods, or fatty foods. ?Eat only light foods, such as  toast or crackers. ?All liquids are okay except energy drinks and alcohol. ?6 hours before the procedure ?Stop eating. ?Drink only clear liquids, such as water, clear fruit juice, black coffee, plain tea, and sports drinks. ?Do not drink energy drinks or alcohol. ?2 hours before the procedure ?Stop drinking all liquids. ?You may be allowed to take medicines with small sips of water. ?If you do not follow your health care provider's instructions, your procedure may be delayed or canceled. ?Medicines ?Ask your health care provider about: ?Changing or stopping your regular medicines. This is especially important if you are taking diabetes medicines or blood  thinners. ?Taking medicines such as aspirin and ibuprofen. These medicines can thin your blood. Do not take these medicines unless your health care provider tells you to take them. ?Taking over-the-counter medicines, vitamins, herbs, and supplements. ?General instructions ?Ask your health care provider what steps will be taken to help prevent infection. These steps may include: ?Removing hair at the procedure site. ?Washing skin with a germ-killing soap. ?Taking antibiotic medicine. ?If you will be going home right after the procedure, plan to have a responsible adult: ?Take you home from the hospital or clinic. You will not be allowed to drive. ?Care for you for the time you are told. ?What happens during the procedure? ? ?You will be asked to empty your bladder. ?An IV will be inserted into one of your veins. ?You will be given one or more of the following: ?A medicine to help you relax (sedative). ?A medicine to numb the area (local anesthetic). ?A medicine to make you fall asleep (general anesthetic). ?A long, thin scope called a cystoscope will be passed into your bladder through the part of the body that carries urine from your bladder (urethra). ?The cystoscope will be used to fill your bladder with water. ?A long needle will be passed through the cystoscope and into the bladder. ?The botulinum toxin will be injected into your bladder. It may be injected into multiple areas of your bladder. ?The cystoscope will be removed and your bladder will be emptied with a urinary catheter. ?The procedure may vary among health care providers and hospitals. ?What can I expect after the procedure? ?After your procedure, it is common to have: ?Blood-tinged urine. ?Burning or soreness when you pass urine. ?Follow these instructions at home: ?Medicines ?Take over-the-counter and prescription medicines only as told by your health care provider. ?If you were prescribed an antibiotic medicine, take it as told by your health care  provider. Do not stop using the antibiotic even if you start to feel better. ?General instructions ? ?If you were given a sedative during the procedure, it can affect you for several hours. Do not drive or operate machinery until your health care provider says that it is safe. ?Drink enough fluid to keep your urine pale yellow. ?Return to your normal activities as told by your health care provider. Ask your health care provider what activities are safe for you. ?Keep all follow-up visits. ?Contact a health care provider if you have: ?A fever or chills. ?Blood-tinged urine for more than one day after your procedure. ?Worsening pain or burning when you pass urine. ?Pain or burning when passing urine for more than two days after your procedure. ?Trouble emptying your bladder. ?Get help right away if you: ?Have bright red blood in your urine. ?Are unable to pass urine. ?Summary ?A botulinum toxin bladder injection is a procedure to treat an overactive bladder. ?This is generally a safe procedure.  However, problems may occur, including not being able to pass urine, bleeding, infection, pain, and an allergic reaction to the botulinum toxin. ?You will be told when to stop eating and drinking, and what medicines to change or stop. Follow instructions carefully. ?After the procedure, it is common to have blood in your urine and to have soreness or burning when passing urine. ?Contact a health care provider if you have a fever, blood in your urine for more than a few days, or trouble passing urine. Get help right away if you have bright red blood in your urine, or if you are unable to pass urine. ?This information is not intended to replace advice given to you by your health care provider. Make sure you discuss any questions you have with your health care provider. ?Document Revised: 05/06/2021 Document Reviewed: 05/06/2021 ?Elsevier Patient Education ? [place]. ? ?General Anesthesia, Adult, Care After ?This sheet  gives you information about how to care for yourself after your procedure. Your health care provider may also give you more specific instructions. If you have problems or questions, contact your health

## 2022-03-08 ENCOUNTER — Encounter (HOSPITAL_COMMUNITY)
Admission: RE | Admit: 2022-03-08 | Discharge: 2022-03-08 | Disposition: A | Discharge status: Home / Self Care | Payer: Medicare HMO | Source: Ambulatory Visit | Attending: Urology | Admitting: Urology

## 2022-03-08 DIAGNOSIS — Z01810 Encounter for preprocedural cardiovascular examination: Secondary | ICD-10-CM | POA: Insufficient documentation

## 2022-03-10 ENCOUNTER — Encounter: Payer: Self-pay | Admitting: Urology

## 2022-03-10 ENCOUNTER — Ambulatory Visit: Payer: Medicare HMO | Admitting: Urology

## 2022-03-10 VITALS — BP 125/85 | HR 83

## 2022-03-10 DIAGNOSIS — N3941 Urge incontinence: Secondary | ICD-10-CM

## 2022-03-10 DIAGNOSIS — N401 Enlarged prostate with lower urinary tract symptoms: Secondary | ICD-10-CM

## 2022-03-10 DIAGNOSIS — C61 Malignant neoplasm of prostate: Secondary | ICD-10-CM | POA: Diagnosis not present

## 2022-03-10 DIAGNOSIS — N138 Other obstructive and reflux uropathy: Secondary | ICD-10-CM

## 2022-03-10 DIAGNOSIS — R32 Unspecified urinary incontinence: Secondary | ICD-10-CM

## 2022-03-10 LAB — MICROSCOPIC EXAMINATION
Bacteria, UA: NONE SEEN
Epithelial Cells (non renal): NONE SEEN /hpf (ref 0–10)
Renal Epithel, UA: NONE SEEN /hpf
WBC, UA: NONE SEEN /hpf (ref 0–5)

## 2022-03-10 LAB — URINALYSIS, ROUTINE W REFLEX MICROSCOPIC
Bilirubin, UA: NEGATIVE
Glucose, UA: NEGATIVE
Ketones, UA: NEGATIVE
Leukocytes,UA: NEGATIVE
Nitrite, UA: NEGATIVE
Protein,UA: NEGATIVE
Specific Gravity, UA: 1.02 (ref 1.005–1.030)
Urobilinogen, Ur: 0.2 mg/dL (ref 0.2–1.0)
pH, UA: 5.5 (ref 5.0–7.5)

## 2022-03-10 MED ORDER — TOLTERODINE TARTRATE ER 4 MG PO CP24: 4.0000 mg | ORAL_CAPSULE | Freq: Every day | ORAL | 2 refills | Status: DC

## 2022-03-10 MED ORDER — GENTAMICIN SULFATE 40 MG/ML IJ SOLN
500.0000 mg | INTRAVENOUS | Status: AC
  Administered 2022-03-13: 500 mg via INTRAVENOUS
  Filled 2022-03-10: qty 12.5

## 2022-03-10 NOTE — Progress Notes (Signed)
? ?03/10/2022 ?9:50 AM  ? ?Gregory Kaneshiro Habig Sr. ?November 01, 1951 ?[id number] ? ?Referring provider: Sharilyn Sites, MD ?[address] ?[place],  [place] [address] ? ?Urge incontinence ? ? ?HPI: ?Gregory Diaz is a 71yo here for followup for urge incontinence. He has failed multiple beta3s and anticholinergics. [name] '8mg'$  worked well for him but insurance will not cover the medication. PTNS failed to improve his urge incontinence. IPSS 8 QOL 4. No other complaints today ? ? ?PMH: ?Past Medical History:  ?Diagnosis Date  ? Abnormal SPEP 09/09/2015  ? Anxiety   ? Arthritis   ? DDD, low back  ? Early cataracts, bilateral   ? Enlarged prostate   ? Heart murmur   ? was told at age 8, but has not had any problems  ? Hypertension   ? MGUS (monoclonal gammopathy of unknown significance) 01/29/2016  ? Prostate cancer ([place])   ? Renal disorder   ? ? ?Surgical History: ?Past Surgical History:  ?Procedure Laterality Date  ? BACK SURGERY  2004, 2015  ? COLONOSCOPY    ? EYE SURGERY    ? L eye- as a child   ? FRACTURE SURGERY Right   ? thumb  ? HEMORROIDECTOMY    ? HERNIA REPAIR    ? umbilical hernia repair  ? MASS EXCISION Right 07/11/2016  ? Procedure: EXCISION 6CM RIGHT SHOULDER MASS;  Surgeon: Vickie Epley, MD;  Location: [facility];  Service: Vascular;  Laterality: Right;  ? PROSTATE BIOPSY    ? TONSILLECTOMY    ? WOUND DEBRIDEMENT N/A 10/25/2018  ? Procedure: DEBRIDEMENT OF MUSCLE AND FASCIA AT UMBILICUS;  Surgeon: Virl Cagey, MD;  Location: [facility];  Service: General;  Laterality: N/A;  ? ? ?Home Medications:  ?Allergies as of 03/10/2022   ? ?   Reactions  ? Penicillins Swelling, Other (See Comments)  ? Has patient had a PCN reaction causing immediate rash, facial/tongue/throat swelling, SOB or lightheadedness with hypotension: Yes ?Has patient had a PCN reaction causing severe rash involving mucus membranes or skin necrosis: No ?Has patient had a PCN reaction that required hospitalization No ?Has patient had a PCN reaction  occurring within the last 10 years: No ?If all of the above answers are "NO", then may proceed with Cephalosporin use.  ? ?  ? ?  ?Medication List  ?  ? ?  ? Accurate as of March 10, 2022  9:50 AM. If you have any questions, ask your nurse or doctor.  ?  ?  ? ?  ? ?acetaminophen 650 MG CR tablet ?Commonly known as: TYLENOL ?Take 650-1,300 mg by mouth every 8 (eight) hours as needed for pain. ?  ?alfuzosin 10 MG 24 hr tablet ?Commonly known as: UROXATRAL ?Take 1 tablet (10 mg total) by mouth in the morning and at bedtime. ?  ?Gemtesa 75 MG Tabs ?Generic drug: Vibegron ?Take 1 capsule by mouth daily. ?  ?lisinopril 20 MG tablet ?Commonly known as: ZESTRIL ?Take 20 mg by mouth daily. ?  ?sulfamethoxazole-trimethoprim 800-160 MG tablet ?Commonly known as: BACTRIM DS ?Take 2 tablets by mouth daily. ?  ?Toviaz 8 MG Tb24 tablet ?Generic drug: fesoterodine ?Take 8 mg by mouth daily. ?  ?Trospium Chloride 60 MG Cp24 ?Take 1 capsule (60 mg total) by mouth daily. ?  ?VITAMIN D3 PO ?Take 1 tablet by mouth daily. ?  ? ?  ? ? ?Allergies:  ?Allergies  ?Allergen Reactions  ? Penicillins Swelling and Other (See Comments)  ?  Has patient had  a PCN reaction causing immediate rash, facial/tongue/throat swelling, SOB or lightheadedness with hypotension: Yes ?Has patient had a PCN reaction causing severe rash involving mucus membranes or skin necrosis: No ?Has patient had a PCN reaction that required hospitalization No ?Has patient had a PCN reaction occurring within the last 10 years: No ?If all of the above answers are "NO", then may proceed with Cephalosporin use. ?  ? ? ?Family History: ?Family History  ?Problem Relation Age of Onset  ? Gallstones Father   ? Ulcers Father   ? Alcohol abuse Father   ? Diabetes Daughter   ? Diabetes Brother   ? Diabetes Sister   ? Breast cancer Sister   ? ? ?Social History:  reports that he has been smoking cigarettes. He has a 25.00 pack-year smoking history. He has never used smokeless tobacco. He  reports that he does not drink alcohol and does not use drugs. ? ?ROS: ?All other review of systems were reviewed and are negative except what is noted above in HPI ? ?Physical Exam: ?BP 125/85   Pulse 83   ?Constitutional:  Alert and oriented, No acute distress. ?HEENT:  AT, moist mucus membranes.  Trachea midline, no masses. ?Cardiovascular: No clubbing, cyanosis, or edema. ?Respiratory: Normal respiratory effort, no increased work of breathing. ?GI: Abdomen is soft, nontender, nondistended, no abdominal masses ?GU: No CVA tenderness.  ?Lymph: No cervical or inguinal lymphadenopathy. ?Skin: No rashes, bruises or suspicious lesions. ?Neurologic: Grossly intact, no focal deficits, moving all 4 extremities. ?Psychiatric: Normal mood and affect. ? ?Laboratory Data: ?Lab Results  ?Component Value Date  ? WBC 8.7 05/23/2020  ? HGB 15.2 05/23/2020  ? HCT 47.9 05/23/2020  ? MCV 86.5 05/23/2020  ? PLT 237 05/23/2020  ? ? ?Lab Results  ?Component Value Date  ? CREATININE 1.29 (H) 05/23/2020  ? ? ?No results found for: PSA ? ?No results found for: TESTOSTERONE ? ?Lab Results  ?Component Value Date  ? HGBA1C 5.5 10/16/2018  ? ? ?Urinalysis ?   ?Component Value Date/Time  ? COLORURINE AMBER (A) 05/23/2020 1846  ? APPEARANCEUR Clear 02/01/2022 1133  ? LABSPEC 1.029 05/23/2020 1846  ? PHURINE 5.0 05/23/2020 1846  ? GLUCOSEU Negative 02/01/2022 1133  ? [place] NEGATIVE 05/23/2020 1846  ? BILIRUBINUR Negative 02/01/2022 1133  ? [place] NEGATIVE 05/23/2020 1846  ? PROTEINUR Negative 02/01/2022 1133  ? PROTEINUR 100 (A) 05/23/2020 1846  ? UROBILINOGEN 0.2 05/12/2020 0917  ? UROBILINOGEN 0.2 08/22/2015 1609  ? NITRITE Negative 02/01/2022 1133  ? NITRITE NEGATIVE 05/23/2020 1846  ? LEUKOCYTESUR Negative 02/01/2022 1133  ? LEUKOCYTESUR NEGATIVE 05/23/2020 1846  ? ? ?Lab Results  ?Component Value Date  ? LABMICR See below: 02/01/2022  ? [place] None seen 02/01/2022  ? LABEPIT None seen 02/01/2022  ? BACTERIA None seen 02/01/2022   ? ? ?Pertinent Imaging: ? ?No results found for this or any previous visit. ? ?No results found for this or any previous visit. ? ?No results found for this or any previous visit. ? ?No results found for this or any previous visit. ? ?Results for orders placed during the hospital encounter of 08/22/15 ? ?US Renal ? ?Narrative ?CLINICAL DATA:  Acute kidney injury ? ?EXAM: ?RENAL / URINARY TRACT ULTRASOUND COMPLETE ? ?COMPARISON:  None. ? ?FINDINGS: ?Right Kidney: ? ?Length: 15.6 cm. Numerous cysts in the upper and midpole all ?measuring less than or equal to 5 cm. No hydronephrosis. Normal ?echogenicity. ? ?Left Kidney: ? ?Length: 15.1 cm. Two lower pole cysts 1  measuring 4.4 cm in the ?other measuring 3.6 cm. No hydronephrosis. Normal echogenicity. ? ?Bladder: ? ?Appears normal for degree of bladder distention. ? ?IMPRESSION: ?Bilateral simple cysts.  No acute abnormalities. ? ? ?Electronically Signed ?By: Skipper Cliche M.D. ?On: 08/24/2015 08:28 ? ?No results found for this or any previous visit. ? ?No results found for this or any previous visit. ? ?No results found for this or any previous visit. ? ? ?Assessment & Plan:   ? ?1. Urge incontinence ?-Patient to proceed with intravesical botox next week. Risks/benefits/alternatives discussed ? ? ?No follow-ups on file. ? ?Nicolette Bang, MD ? ?[facility] ?  ?

## 2022-03-10 NOTE — H&P (View-Only) (Signed)
? ?03/10/2022 ?9:50 AM  ? ?Gregory Freiberger Kirchner Sr. ?Aug 24, 1951 ?[id number] ? ?Referring provider: Sharilyn Sites, MD ?[address] ?[place],  [place] [address] ? ?Urge incontinence ? ? ?HPI: ?Gregory Diaz is a 71yo here for followup for urge incontinence. He has failed multiple beta3s and anticholinergics. Toviaz '8mg'$  worked well for him but insurance will not cover the medication. PTNS failed to improve his urge incontinence. IPSS 8 QOL 4. No other complaints today ? ? ?PMH: ?Past Medical History:  ?Diagnosis Date  ? Abnormal SPEP 09/09/2015  ? Anxiety   ? Arthritis   ? DDD, low back  ? Early cataracts, bilateral   ? Enlarged prostate   ? Heart murmur   ? was told at age 48, but has not had any problems  ? Hypertension   ? MGUS (monoclonal gammopathy of unknown significance) 01/29/2016  ? Prostate cancer ([place])   ? Renal disorder   ? ? ?Surgical History: ?Past Surgical History:  ?Procedure Laterality Date  ? BACK SURGERY  2004, 2015  ? COLONOSCOPY    ? EYE SURGERY    ? L eye- as a child   ? FRACTURE SURGERY Right   ? thumb  ? HEMORROIDECTOMY    ? HERNIA REPAIR    ? umbilical hernia repair  ? MASS EXCISION Right 07/11/2016  ? Procedure: EXCISION 6CM RIGHT SHOULDER MASS;  Surgeon: Vickie Epley, MD;  Location: [facility];  Service: Vascular;  Laterality: Right;  ? PROSTATE BIOPSY    ? TONSILLECTOMY    ? WOUND DEBRIDEMENT N/A 10/25/2018  ? Procedure: DEBRIDEMENT OF MUSCLE AND FASCIA AT UMBILICUS;  Surgeon: Virl Cagey, MD;  Location: [facility];  Service: General;  Laterality: N/A;  ? ? ?Home Medications:  ?Allergies as of 03/10/2022   ? ?   Reactions  ? Penicillins Swelling, Other (See Comments)  ? Has patient had a PCN reaction causing immediate rash, facial/tongue/throat swelling, SOB or lightheadedness with hypotension: Yes ?Has patient had a PCN reaction causing severe rash involving mucus membranes or skin necrosis: No ?Has patient had a PCN reaction that required hospitalization No ?Has patient had a PCN reaction  occurring within the last 10 years: No ?If all of the above answers are "NO", then may proceed with Cephalosporin use.  ? ?  ? ?  ?Medication List  ?  ? ?  ? Accurate as of March 10, 2022  9:50 AM. If you have any questions, ask your nurse or doctor.  ?  ?  ? ?  ? ?acetaminophen 650 MG CR tablet ?Commonly known as: TYLENOL ?Take 650-1,300 mg by mouth every 8 (eight) hours as needed for pain. ?  ?alfuzosin 10 MG 24 hr tablet ?Commonly known as: UROXATRAL ?Take 1 tablet (10 mg total) by mouth in the morning and at bedtime. ?  ?Gemtesa 75 MG Tabs ?Generic drug: Vibegron ?Take 1 capsule by mouth daily. ?  ?lisinopril 20 MG tablet ?Commonly known as: ZESTRIL ?Take 20 mg by mouth daily. ?  ?sulfamethoxazole-trimethoprim 800-160 MG tablet ?Commonly known as: BACTRIM DS ?Take 2 tablets by mouth daily. ?  ?Toviaz 8 MG Tb24 tablet ?Generic drug: fesoterodine ?Take 8 mg by mouth daily. ?  ?Trospium Chloride 60 MG Cp24 ?Take 1 capsule (60 mg total) by mouth daily. ?  ?VITAMIN D3 PO ?Take 1 tablet by mouth daily. ?  ? ?  ? ? ?Allergies:  ?Allergies  ?Allergen Reactions  ? Penicillins Swelling and Other (See Comments)  ?  Has patient had  a PCN reaction causing immediate rash, facial/tongue/throat swelling, SOB or lightheadedness with hypotension: Yes ?Has patient had a PCN reaction causing severe rash involving mucus membranes or skin necrosis: No ?Has patient had a PCN reaction that required hospitalization No ?Has patient had a PCN reaction occurring within the last 10 years: No ?If all of the above answers are "NO", then may proceed with Cephalosporin use. ?  ? ? ?Family History: ?Family History  ?Problem Relation Age of Onset  ? Gallstones Father   ? Ulcers Father   ? Alcohol abuse Father   ? Diabetes Daughter   ? Diabetes Brother   ? Diabetes Sister   ? Breast cancer Sister   ? ? ?Social History:  reports that he has been smoking cigarettes. He has a 25.00 pack-year smoking history. He has never used smokeless tobacco. He  reports that he does not drink alcohol and does not use drugs. ? ?ROS: ?All other review of systems were reviewed and are negative except what is noted above in HPI ? ?Physical Exam: ?BP 125/85   Pulse 83   ?Constitutional:  Alert and oriented, No acute distress. ?HEENT: [place] AT, moist mucus membranes.  Trachea midline, no masses. ?Cardiovascular: No clubbing, cyanosis, or edema. ?Respiratory: Normal respiratory effort, no increased work of breathing. ?GI: Abdomen is soft, nontender, nondistended, no abdominal masses ?GU: No CVA tenderness.  ?Lymph: No cervical or inguinal lymphadenopathy. ?Skin: No rashes, bruises or suspicious lesions. ?Neurologic: Grossly intact, no focal deficits, moving all 4 extremities. ?Psychiatric: Normal mood and affect. ? ?Laboratory Data: ?Lab Results  ?Component Value Date  ? WBC 8.7 05/23/2020  ? HGB 15.2 05/23/2020  ? HCT 47.9 05/23/2020  ? MCV 86.5 05/23/2020  ? PLT 237 05/23/2020  ? ? ?Lab Results  ?Component Value Date  ? CREATININE 1.29 (H) 05/23/2020  ? ? ?No results found for: PSA ? ?No results found for: TESTOSTERONE ? ?Lab Results  ?Component Value Date  ? HGBA1C 5.5 10/16/2018  ? ? ?Urinalysis ?   ?Component Value Date/Time  ? COLORURINE AMBER (A) 05/23/2020 1846  ? APPEARANCEUR Clear 02/01/2022 1133  ? LABSPEC 1.029 05/23/2020 1846  ? PHURINE 5.0 05/23/2020 1846  ? GLUCOSEU Negative 02/01/2022 1133  ? [place] NEGATIVE 05/23/2020 1846  ? BILIRUBINUR Negative 02/01/2022 1133  ? [place] NEGATIVE 05/23/2020 1846  ? PROTEINUR Negative 02/01/2022 1133  ? PROTEINUR 100 (A) 05/23/2020 1846  ? UROBILINOGEN 0.2 05/12/2020 0917  ? UROBILINOGEN 0.2 08/22/2015 1609  ? NITRITE Negative 02/01/2022 1133  ? NITRITE NEGATIVE 05/23/2020 1846  ? LEUKOCYTESUR Negative 02/01/2022 1133  ? LEUKOCYTESUR NEGATIVE 05/23/2020 1846  ? ? ?Lab Results  ?Component Value Date  ? LABMICR See below: 02/01/2022  ? [place] None seen 02/01/2022  ? LABEPIT None seen 02/01/2022  ? BACTERIA None seen 02/01/2022   ? ? ?Pertinent Imaging: ? ?No results found for this or any previous visit. ? ?No results found for this or any previous visit. ? ?No results found for this or any previous visit. ? ?No results found for this or any previous visit. ? ?Results for orders placed during the hospital encounter of 08/22/15 ? ?US Renal ? ?Narrative ?CLINICAL DATA:  Acute kidney injury ? ?EXAM: ?RENAL / URINARY TRACT ULTRASOUND COMPLETE ? ?COMPARISON:  None. ? ?FINDINGS: ?Right Kidney: ? ?Length: 15.6 cm. Numerous cysts in the upper and midpole all ?measuring less than or equal to 5 cm. No hydronephrosis. Normal ?echogenicity. ? ?Left Kidney: ? ?Length: 15.1 cm. Two lower pole cysts 1  measuring 4.4 cm in the ?other measuring 3.6 cm. No hydronephrosis. Normal echogenicity. ? ?Bladder: ? ?Appears normal for degree of bladder distention. ? ?IMPRESSION: ?Bilateral simple cysts.  No acute abnormalities. ? ? ?Electronically Signed ?By: Skipper Cliche M.D. ?On: 08/24/2015 08:28 ? ?No results found for this or any previous visit. ? ?No results found for this or any previous visit. ? ?No results found for this or any previous visit. ? ? ?Assessment & Plan:   ? ?1. Urge incontinence ?-Patient to proceed with intravesical botox next week. Risks/benefits/alternatives discussed ? ? ?No follow-ups on file. ? ?Nicolette Bang, MD ? ?[facility] [place] [facility] ?  ?

## 2022-03-10 NOTE — Patient Instructions (Signed)

## 2022-03-13 ENCOUNTER — Other Ambulatory Visit: Payer: Self-pay

## 2022-03-13 ENCOUNTER — Encounter (HOSPITAL_COMMUNITY)
Admission: RE | Disposition: A | Discharge status: Home / Self Care | Payer: Self-pay | Source: Home / Self Care | Attending: Urology

## 2022-03-13 ENCOUNTER — Encounter (HOSPITAL_COMMUNITY): Payer: Self-pay | Admitting: Urology

## 2022-03-13 ENCOUNTER — Ambulatory Visit (HOSPITAL_BASED_OUTPATIENT_CLINIC_OR_DEPARTMENT_OTHER): Payer: Medicare HMO | Admitting: Anesthesiology

## 2022-03-13 ENCOUNTER — Ambulatory Visit (HOSPITAL_COMMUNITY)
Admission: RE | Admit: 2022-03-13 | Discharge: 2022-03-13 | Disposition: A | Discharge status: Home / Self Care | Payer: Medicare HMO | Attending: Urology | Admitting: Urology

## 2022-03-13 ENCOUNTER — Ambulatory Visit (HOSPITAL_COMMUNITY): Payer: Medicare HMO | Admitting: Anesthesiology

## 2022-03-13 DIAGNOSIS — F17210 Nicotine dependence, cigarettes, uncomplicated: Secondary | ICD-10-CM | POA: Diagnosis not present

## 2022-03-13 DIAGNOSIS — I10 Essential (primary) hypertension: Secondary | ICD-10-CM

## 2022-03-13 DIAGNOSIS — N3281 Overactive bladder: Secondary | ICD-10-CM | POA: Insufficient documentation

## 2022-03-13 DIAGNOSIS — F419 Anxiety disorder, unspecified: Secondary | ICD-10-CM

## 2022-03-13 DIAGNOSIS — G709 Myoneural disorder, unspecified: Secondary | ICD-10-CM

## 2022-03-13 DIAGNOSIS — Z6837 Body mass index (BMI) 37.0-37.9, adult: Secondary | ICD-10-CM | POA: Insufficient documentation

## 2022-03-13 HISTORY — PX: CYSTOSCOPY WITH INJECTION: SHX1424

## 2022-03-13 SURGERY — CYSTOSCOPY, WITH INJECTION OF BLADDER NECK OR BLADDER WALL
Anesthesia: General | Site: Bladder

## 2022-03-13 MED ORDER — FENTANYL CITRATE PF 50 MCG/ML IJ SOSY: 25.0000 ug | PREFILLED_SYRINGE | INTRAMUSCULAR | Status: DC | PRN

## 2022-03-13 MED ORDER — LACTATED RINGERS IV SOLN: INTRAVENOUS | Status: DC

## 2022-03-13 MED ORDER — TRAMADOL HCL 50 MG PO TABS: 50.0000 mg | ORAL_TABLET | Freq: Four times a day (QID) | ORAL | 0 refills | Status: AC | PRN

## 2022-03-13 MED ORDER — SODIUM CHLORIDE (PF) 0.9 % IJ SOLN
INTRAMUSCULAR | Status: DC | PRN
  Administered 2022-03-13: 20 mL

## 2022-03-13 MED ORDER — WATER FOR IRRIGATION, STERILE IR SOLN
Status: DC | PRN
  Administered 2022-03-13: 500 mL

## 2022-03-13 MED ORDER — CHLORHEXIDINE GLUCONATE 0.12 % MT SOLN: 15.0000 mL | Freq: Once | OROMUCOSAL | Status: DC

## 2022-03-13 MED ORDER — PROPOFOL 10 MG/ML IV BOLUS
INTRAVENOUS | Status: DC | PRN
  Administered 2022-03-13: 150 mg via INTRAVENOUS

## 2022-03-13 MED ORDER — ONABOTULINUMTOXINA 100 UNITS IJ SOLR
100.0000 [IU] | Freq: Once | INTRAMUSCULAR | Status: DC
  Filled 2022-03-13: qty 100

## 2022-03-13 MED ORDER — PROPOFOL 10 MG/ML IV BOLUS
INTRAVENOUS | Status: AC
  Filled 2022-03-13: qty 20

## 2022-03-13 MED ORDER — SODIUM CHLORIDE (PF) 0.9 % IJ SOLN
INTRAMUSCULAR | Status: AC
  Filled 2022-03-13: qty 20

## 2022-03-13 MED ORDER — ONABOTULINUMTOXINA 100 UNITS IJ SOLR
INTRAMUSCULAR | Status: DC | PRN
  Administered 2022-03-13: 100 [IU] via INTRAMUSCULAR

## 2022-03-13 MED ORDER — ORAL CARE MOUTH RINSE: 15.0000 mL | Freq: Once | OROMUCOSAL | Status: DC

## 2022-03-13 MED ORDER — FENTANYL CITRATE (PF) 100 MCG/2ML IJ SOLN
INTRAMUSCULAR | Status: DC | PRN
  Administered 2022-03-13 (×2): 25 ug via INTRAVENOUS

## 2022-03-13 MED ORDER — ONDANSETRON HCL 4 MG/2ML IJ SOLN: 4.0000 mg | Freq: Once | INTRAMUSCULAR | Status: DC | PRN

## 2022-03-13 MED ORDER — FENTANYL CITRATE (PF) 100 MCG/2ML IJ SOLN
INTRAMUSCULAR | Status: AC
  Filled 2022-03-13: qty 2

## 2022-03-13 SURGICAL SUPPLY — 26 items
BAG DRAIN URO TABLE W/ADPT NS (BAG) ×3 IMPLANT
BAG DRN 8 ADPR NS SKTRN CSTL (BAG) ×2
BAG HAMPER (MISCELLANEOUS) ×3 IMPLANT
CLOTH BEACON ORANGE TIMEOUT ST (SAFETY) ×3 IMPLANT
GLOVE BIO SURGEON STRL SZ8 (GLOVE) ×3 IMPLANT
GLOVE BIOGEL PI IND STRL 6.5 (GLOVE) ×1 IMPLANT
GLOVE BIOGEL PI IND STRL 7.0 (GLOVE) ×3 IMPLANT
GLOVE BIOGEL PI INDICATOR 6.5 (GLOVE) ×1
GLOVE BIOGEL PI INDICATOR 7.0 (GLOVE) ×1
GLOVE SS BIOGEL STRL SZ 6.5 (GLOVE) ×1 IMPLANT
GLOVE SUPERSENSE BIOGEL SZ 6.5 (GLOVE) ×1
GOWN STRL REUS W/TWL LRG LVL3 (GOWN DISPOSABLE) ×3 IMPLANT
GOWN STRL REUS W/TWL XL LVL3 (GOWN DISPOSABLE) ×3 IMPLANT
KIT TURNOVER CYSTO (KITS) ×3 IMPLANT
MANIFOLD NEPTUNE II (INSTRUMENTS) ×3 IMPLANT
NDL ASPIRATION 22 (NEEDLE) ×1 IMPLANT
NDL HYPO 18GX1.5 BLUNT FILL (NEEDLE) ×1 IMPLANT
NEEDLE ASPIRATION 22 (NEEDLE) ×3 IMPLANT
NEEDLE HYPO 18GX1.5 BLUNT FILL (NEEDLE) ×3 IMPLANT
PACK CYSTO (CUSTOM PROCEDURE TRAY) ×3 IMPLANT
PAD ARMBOARD 7.5X6 YLW CONV (MISCELLANEOUS) ×3 IMPLANT
SYR 30ML LL (SYRINGE) ×3 IMPLANT
SYR CONTROL 10ML LL (SYRINGE) ×3 IMPLANT
TOWEL OR 17X26 4PK STRL BLUE (TOWEL DISPOSABLE) ×3 IMPLANT
WATER STERILE IRR 3000ML UROMA (IV SOLUTION) ×3 IMPLANT
WATER STERILE IRR 500ML POUR (IV SOLUTION) ×3 IMPLANT

## 2022-03-13 NOTE — Op Note (Signed)
Preoperative diagnosis: overactive bladder ? ?Postoperative diagnosis: same ? ?Procedure: 1 cystoscopy ?2. Intravesical botox injection 100 units ? ?Attending: Nicolette Bang ? ?Anesthesia: General ? ?Estimated blood loss: Minimal ? ?Drains: none ? ?Specimens: none ? ?Antibiotics: ancef ? ?Findings:  Ureteral orifices in normal anatomic location.  No masses/lesions in the bladder ? ?Indications: Patient is a 71 year old male with a history of overactive bladder and urinary leakage refractory to anticholinergic therapy.  After discussing treatment options, they decided proceed with intravesical botox injection. ? ?Procedure her in detail: The patient was brought to the operating room and a brief timeout was done to ensure correct patient, correct procedure, correct site.  General anesthesia was administered patient was placed in dorsal lithotomy position.  Their genitalia was then prepped and draped in usual sterile fashion.  A rigid 2 French cystoscope was passed in the urethra and the bladder.  Bladder was inspected and we noted no masses or lesions.  the ureteral orifices were in the normal orthotopic locations. Using a deflux injection needle we proceed to inject 100 units in a grid pattern between the ureteral orifices starting at the trigone to the mid posterior wall. We injected a total of 20 sites with 5 units per injection. The bladder was then drained and this concluded the procedure which was well tolerated by patient. ? ?Complications: None ? ?Condition: Stable, extubated, transferred to PACU ? ?Plan: Patient is to be discharge home. They will followup in 1 month ? ?

## 2022-03-13 NOTE — Anesthesia Procedure Notes (Signed)
Procedure Name: LMA Insertion ?Date/Time: 03/13/2022 8:42 AM ?Performed by: Vista Deck, CRNA ?Pre-anesthesia Checklist: Patient identified, Patient being monitored, Emergency Drugs available, Timeout performed and Suction available ?Patient Re-evaluated:Patient Re-evaluated prior to induction ?Oxygen Delivery Method: Circle System Utilized ?Preoxygenation: Pre-oxygenation with 100% oxygen ?Induction Type: IV induction ?Ventilation: Mask ventilation without difficulty ?LMA: LMA inserted ?LMA Size: 4.0 ?Number of attempts: 1 ?Placement Confirmation: positive ETCO2 and breath sounds checked- equal and bilateral ?Tube secured with: Tape ?Dental Injury: Teeth and Oropharynx as per pre-operative assessment  ? ? ? ? ?

## 2022-03-13 NOTE — Anesthesia Postprocedure Evaluation (Signed)
Anesthesia Post Note ? ?Patient: Gregory Ciancio Jezek Sr. ? ?Procedure(s) Performed: CYSTOSCOPY WITH INJECTION- BOTOX 100 units (Bladder) ? ?Patient location during evaluation: Phase II ?Anesthesia Type: General ?Level of consciousness: awake ?Pain management: pain level controlled ?Vital Signs Assessment: post-procedure vital signs reviewed and stable ?Respiratory status: spontaneous breathing and respiratory function stable ?Cardiovascular status: blood pressure returned to baseline and stable ?Postop Assessment: no headache and no apparent nausea or vomiting ?Anesthetic complications: no ?Comments: Late entry ? ? ?No notable events documented. ? ? ?Last Vitals:  ?Vitals:  ? 03/13/22 0952 03/13/22 0955  ?BP:  111/62  ?Pulse: 84   ?Resp: 15   ?Temp:    ?SpO2: 96%   ?  ?Last Pain:  ?Vitals:  ? 03/13/22 0955  ?TempSrc:   ?PainSc: 0-No pain  ? ? ?  ?  ?  ?  ?  ?  ? ?Louann Sjogren ? ? ? ? ?

## 2022-03-13 NOTE — Transfer of Care (Signed)
Immediate Anesthesia Transfer of Care Note ? ?Patient: Gregory Haven Ambriz Sr. ? ?Procedure(s) Performed: CYSTOSCOPY WITH INJECTION- BOTOX 100 units (Bladder) ?BOTOX INJECTION (Bladder) ? ?Patient Location: PACU ? ?Anesthesia Type:General ? ?Level of Consciousness: awake and patient cooperative ? ?Airway & Oxygen Therapy: Patient Spontanous Breathing and Patient connected to nasal cannula oxygen ? ?Post-op Assessment: Report given to RN and Post -op Vital signs reviewed and stable ? ?Post vital signs: Reviewed and stable ? ?Last Vitals:  ?Vitals Value Taken Time  ?BP 81/61 913  ?Temp 97.6 913  ?Pulse 79 03/13/22 0910  ?Resp 16 03/13/22 0911  ?SpO2 97 % 03/13/22 0910  ?Vitals shown include unvalidated device data. ? ?Last Pain:  ?Vitals:  ? 03/13/22 0718  ?TempSrc: Oral  ?PainSc: 0-No pain  ?   ? ?  ? ?Complications: No notable events documented. ?

## 2022-03-13 NOTE — Interval H&P Note (Signed)
History and Physical Interval Note: ? ?03/13/2022 ?8:10 AM ? ?Gregory A Legler Sr.  has presented today for surgery, with the diagnosis of OAB.  The various methods of treatment have been discussed with the patient and family. After consideration of risks, benefits and other options for treatment, the patient has consented to  Procedure(s): ?CYSTOSCOPY WITH INJECTION- BOTOX 100 units (N/A) ?BOTOX INJECTION (N/A) as a surgical intervention.  The patient's history has been reviewed, patient examined, no change in status, stable for surgery.  I have reviewed the patient's chart and labs.  Questions were answered to the patient's satisfaction.   ? ? ?Gregory Diaz ? ? ?

## 2022-03-13 NOTE — Anesthesia Preprocedure Evaluation (Signed)
Anesthesia Evaluation  ?Patient identified by MRN, date of birth, ID band ?Patient awake ? ? ? ?Reviewed: ?Allergy & Precautions, H&P , NPO status , Patient's Chart, lab work & pertinent test results, reviewed documented beta blocker date and time  ? ?Airway ?Mallampati: II ? ?TM Distance: >3 FB ?Neck ROM: full ? ? ? Dental ?no notable dental hx. ? ?  ?Pulmonary ?neg pulmonary ROS, Current Smoker,  ?  ?Pulmonary exam normal ?breath sounds clear to auscultation ? ? ? ? ? ? Cardiovascular ?Exercise Tolerance: Good ?hypertension, negative cardio ROS ? ? ?Rhythm:regular Rate:Normal ? ? ?  ?Neuro/Psych ?Anxiety  Neuromuscular disease negative psych ROS  ? GI/Hepatic ?negative GI ROS, Neg liver ROS,   ?Endo/Other  ?Morbid obesity ? Renal/GU ?negative Renal ROS  ?negative genitourinary ?  ?Musculoskeletal ? ? Abdominal ?  ?Peds ? Hematology ?negative hematology ROS ?(+)   ?Anesthesia Other Findings ? ? Reproductive/Obstetrics ?negative OB ROS ? ?  ? ? ? ? ? ? ? ? ? ? ? ? ? ?  ?  ? ? ? ? ? ? ? ? ?Anesthesia Physical ?Anesthesia Plan ? ?ASA: 3 ? ?Anesthesia Plan: General and General LMA  ? ?Post-op Pain Management:   ? ?Induction:  ? ?PONV Risk Score and Plan: Ondansetron ? ?Airway Management Planned:  ? ?Additional Equipment:  ? ?Intra-op Plan:  ? ?Post-operative Plan:  ? ?Informed Consent: I have reviewed the patients History and Physical, chart, labs and discussed the procedure including the risks, benefits and alternatives for the proposed anesthesia with the patient or authorized representative who has indicated his/her understanding and acceptance.  ? ? ? ?Dental Advisory Given ? ?Plan Discussed with: CRNA ? ?Anesthesia Plan Comments:   ? ? ? ? ? ? ?Anesthesia Quick Evaluation ? ?

## 2022-03-15 ENCOUNTER — Encounter (HOSPITAL_COMMUNITY): Payer: Self-pay | Admitting: Urology

## 2022-03-28 ENCOUNTER — Ambulatory Visit: Payer: Medicare HMO | Admitting: Physician Assistant

## 2022-03-28 DIAGNOSIS — N138 Other obstructive and reflux uropathy: Secondary | ICD-10-CM

## 2022-04-17 ENCOUNTER — Encounter (INDEPENDENT_AMBULATORY_CARE_PROVIDER_SITE_OTHER): Payer: Self-pay | Admitting: *Deleted

## 2022-04-26 DIAGNOSIS — N182 Chronic kidney disease, stage 2 (mild): Secondary | ICD-10-CM | POA: Diagnosis not present

## 2022-05-12 DIAGNOSIS — E785 Hyperlipidemia, unspecified: Secondary | ICD-10-CM | POA: Diagnosis not present

## 2022-05-12 DIAGNOSIS — E119 Type 2 diabetes mellitus without complications: Secondary | ICD-10-CM | POA: Diagnosis not present

## 2022-05-12 DIAGNOSIS — I10 Essential (primary) hypertension: Secondary | ICD-10-CM | POA: Diagnosis not present

## 2022-05-18 ENCOUNTER — Encounter (INDEPENDENT_AMBULATORY_CARE_PROVIDER_SITE_OTHER): Payer: Self-pay | Admitting: *Deleted

## 2022-05-18 ENCOUNTER — Encounter (INDEPENDENT_AMBULATORY_CARE_PROVIDER_SITE_OTHER): Payer: Self-pay

## 2022-05-18 ENCOUNTER — Telehealth (INDEPENDENT_AMBULATORY_CARE_PROVIDER_SITE_OTHER): Payer: Self-pay

## 2022-05-18 ENCOUNTER — Other Ambulatory Visit (INDEPENDENT_AMBULATORY_CARE_PROVIDER_SITE_OTHER): Payer: Self-pay

## 2022-05-18 DIAGNOSIS — Z1211 Encounter for screening for malignant neoplasm of colon: Secondary | ICD-10-CM

## 2022-05-18 MED ORDER — PEG 3350-KCL-NA BICARB-NACL 420 G PO SOLR
4000.0000 mL | ORAL | 0 refills | Status: DC
Start: 2022-05-18 — End: 2023-11-01

## 2022-05-18 NOTE — Telephone Encounter (Signed)
Ok to schedule.  Thanks,  Maven Rosander Castaneda Mayorga, MD Gastroenterology and Hepatology [facility]  

## 2022-05-18 NOTE — Telephone Encounter (Signed)
Referring MD/PCP: Hilma Favors   Procedure: Tcs  Reason/Indication:  Screening   Has patient had this procedure before?  Yes   If so, when, by whom and where?    Is there a family history of colon cancer?  No   Who?  What age when diagnosed?    Is patient diabetic? If yes, Type 1 or Type 2   no       Does patient have prosthetic heart valve or mechanical valve?  No   Do you have a pacemaker/defibrillator?  No   Has patient ever had endocarditis/atrial fibrillation? No   Does patient use oxygen? No   Has patient had joint replacement within last 12 months?  No   Is patient constipated or do they take laxatives? Yes   Does patient have a history of alcohol/drug use?  No   Have you had a stroke/heart attack last 6 mths? No   Do you take medicine for weight loss?  no  For male patients,: have you had a hysterectomy  n/a                       are you post menopausal n/a                      do you still have your menstrual cycle n/a  Is patient on blood thinner such as Coumadin, Plavix and/or Aspirin? No   Medications: Lisinopril 20 mg daily, alfuzosin hcl 10 mg bid , toterodine tartrate 4 mg er 1 daily  Allergies: PCN   Medication Adjustment per Dr Jenetta Downer none   Procedure date & time: 06/05/22 at 1:15

## 2022-05-18 NOTE — Telephone Encounter (Signed)
Destiny Hagin Ann Zakar Brosch, CMA  ?

## 2022-05-24 ENCOUNTER — Ambulatory Visit (INDEPENDENT_AMBULATORY_CARE_PROVIDER_SITE_OTHER): Payer: Medicare HMO | Admitting: Physician Assistant

## 2022-05-24 VITALS — BP 108/69 | HR 62

## 2022-05-24 DIAGNOSIS — N3281 Overactive bladder: Secondary | ICD-10-CM | POA: Diagnosis not present

## 2022-05-24 DIAGNOSIS — R32 Unspecified urinary incontinence: Secondary | ICD-10-CM

## 2022-05-24 DIAGNOSIS — R351 Nocturia: Secondary | ICD-10-CM

## 2022-05-24 LAB — URINALYSIS, ROUTINE W REFLEX MICROSCOPIC
Bilirubin, UA: NEGATIVE
Glucose, UA: NEGATIVE
Leukocytes,UA: NEGATIVE
Nitrite, UA: NEGATIVE
Protein,UA: NEGATIVE
RBC, UA: NEGATIVE
Specific Gravity, UA: 1.025 (ref 1.005–1.030)
Urobilinogen, Ur: 0.2 mg/dL (ref 0.2–1.0)
pH, UA: 5.5 (ref 5.0–7.5)

## 2022-05-24 LAB — BLADDER SCAN AMB NON-IMAGING: Scan Result: 23

## 2022-05-24 MED ORDER — FESOTERODINE FUMARATE ER 8 MG PO TB24: 8.0000 mg | ORAL_TABLET | Freq: Every day | ORAL | 11 refills | Status: DC

## 2022-05-24 NOTE — Progress Notes (Signed)
Assessment: 1. Urinary incontinence, unspecified type - BLADDER SCAN AMB NON-IMAGING - Urinalysis, Routine w reflex microscopic  2. Nocturia    Plan: Will Continue Uroxatrol and resume Vesicare that he feels has worked well in the past. DC Detrol LA at this time. Discussed OSA at length with the pt and requested that he see his PCP to have sleep study ordered. FU with Dr. Alyson Ingles as previously scheduled in 2 months.  Chief Complaint: No chief complaint on file.   HPI: Gregory Mainer Rathbone Sr. is a 71 y.o. male with h/o OAB who presents for evaluation of urinary frequency and nocturia.. Pt underwent intravesical botox injection on 03/13/22 and states his sxs of incontinence and frequency improved significantly until return of nocturia 6-7 x per night with voiding large amounts of urine each time he goes. Daytime frequency and urgency have also returned. No burning, dysuria, gross hematuria. No fever, chills, flank pain. Pt states Vesicare worked well in the past and he wants to try this again even though insurance didn't cover the med. Currently on Uroxatrol and Detrol LA. Pt admits to h/o snoring, but has no h/o OSA.  UA=clear PVR=35m  03/10/22 Gregory Diaz is a 89yohere for followup for urge incontinence. He has failed multiple beta3s and anticholinergics. Toviaz '8mg'$  worked well for him but insurance will not cover the medication. PTNS failed to improve his urge incontinence. IPSS 8 QOL 4. No other complaints today  Portions of the above documentation were copied from a prior visit for review purposes only.  Allergies: Allergies  Allergen Reactions   Penicillins Swelling and Other (See Comments)    Has patient had a PCN reaction causing immediate rash, facial/tongue/throat swelling, SOB or lightheadedness with hypotension: Yes Has patient had a PCN reaction causing severe rash involving mucus membranes or skin necrosis: No Has patient had a PCN reaction that required hospitalization  No Has patient had a PCN reaction occurring within the last 10 years: No If all of the above answers are "NO", then may proceed with Cephalosporin use.     PMH: Past Medical History:  Diagnosis Date   Abnormal SPEP 09/09/2015   Anxiety    Arthritis    DDD, low back   Early cataracts, bilateral    Enlarged prostate    Heart murmur    was told at age 71 but has not had any problems   Hypertension    MGUS (monoclonal gammopathy of unknown significance) 01/29/2016   Prostate cancer ([facility]    Renal disorder     PSH: Past Surgical History:  Procedure Laterality Date   BACK SURGERY  2004, 2015   COLONOSCOPY     CYSTOSCOPY WITH INJECTION N/A 03/13/2022   Procedure: CYSTOSCOPY WITH INJECTION- BOTOX 100 units;  Surgeon: MCleon Gustin MD;  Location: [facility];  Service: Urology;  Laterality: N/A;   EYE SURGERY     L eye- as a child    FRACTURE SURGERY Right    thumb   HEMORROIDECTOMY     HERNIA REPAIR     umbilical hernia repair   MASS EXCISION Right 07/11/2016   Procedure: EXCISION 6CM RIGHT SHOULDER MASS;  Surgeon: JVickie Epley MD;  Location: [facility];  Service: Vascular;  Laterality: Right;   PROSTATE BIOPSY     TONSILLECTOMY     WOUND DEBRIDEMENT N/A 10/25/2018   Procedure: DEBRIDEMENT OF MUSCLE AND FASCIA AT UMBILICUS;  Surgeon: BVirl Cagey MD;  Location: [facility];  Service: General;  Laterality:  N/A;    SH: Social History   Tobacco Use   Smoking status: Every Day    Packs/day: 0.50    Years: 50.00    Total pack years: 25.00    Types: Cigarettes   Smokeless tobacco: Never  Vaping Use   Vaping Use: Never used  Substance Use Topics   Alcohol use: No   Drug use: No    ROS: All other review of systems were reviewed and are negative except what is noted above in HPI  PE: BP 108/69   Pulse 62  GENERAL APPEARANCE:  Well appearing, well developed, well nourished, NAD HEENT:  Atraumatic, normocephalic NECK:  Supple. Trachea midline ABDOMEN:  Soft,  non-tender, no masses EXTREMITIES:  Moves all extremities well NEUROLOGIC:  Alert and oriented x 3, normal gait MENTAL STATUS:  appropriate BACK:  Non-tender to palpation, No CVAT SKIN:  Warm, dry, and intact  Results: Laboratory Data: Lab Results  Component Value Date   WBC 8.7 05/23/2020   HGB 15.2 05/23/2020   HCT 47.9 05/23/2020   MCV 86.5 05/23/2020   PLT 237 05/23/2020    Lab Results  Component Value Date   CREATININE 1.29 (H) 05/23/2020     Lab Results  Component Value Date   HGBA1C 5.5 10/16/2018    Urinalysis    Component Value Date/Time   COLORURINE AMBER (A) 05/23/2020 1846   APPEARANCEUR Clear 03/10/2022 1119   LABSPEC 1.029 05/23/2020 1846   PHURINE 5.0 05/23/2020 1846   GLUCOSEU Negative 03/10/2022 1119   HGBUR NEGATIVE 05/23/2020 1846   BILIRUBINUR Negative 03/10/2022 1119   KETONESUR NEGATIVE 05/23/2020 1846   PROTEINUR Negative 03/10/2022 1119   PROTEINUR 100 (A) 05/23/2020 1846   UROBILINOGEN 0.2 05/12/2020 0917   UROBILINOGEN 0.2 08/22/2015 1609   NITRITE Negative 03/10/2022 1119   NITRITE NEGATIVE 05/23/2020 1846   LEUKOCYTESUR Negative 03/10/2022 1119   LEUKOCYTESUR NEGATIVE 05/23/2020 1846    Lab Results  Component Value Date   LABMICR See below: 03/10/2022   WBCUA None seen 03/10/2022   LABEPIT None seen 03/10/2022   MUCUS Present 03/10/2022   BACTERIA None seen 03/10/2022    Pertinent Imaging: No results found for this or any previous visit.  No results found for this or any previous visit.  No results found for this or any previous visit.  No results found for this or any previous visit.  Results for orders placed during the hospital encounter of 08/22/15  US Renal  Narrative CLINICAL DATA:  Acute kidney injury  EXAM: RENAL / URINARY TRACT ULTRASOUND COMPLETE  COMPARISON:  None.  FINDINGS: Right Kidney:  Length: 15.6 cm. Numerous cysts in the upper and midpole all measuring less than or equal to 5 cm. No  hydronephrosis. Normal echogenicity.  Left Kidney:  Length: 15.1 cm. Two lower pole cysts 1 measuring 4.4 cm in the other measuring 3.6 cm. No hydronephrosis. Normal echogenicity.  Bladder:  Appears normal for degree of bladder distention.  IMPRESSION: Bilateral simple cysts.  No acute abnormalities.   Electronically Signed By: Skipper Cliche M.D. On: 08/24/2015 08:28  No results found for this or any previous visit.  No results found for this or any previous visit.  No results found for this or any previous visit.  No results found for this or any previous visit (from the past 24 hour(s)).

## 2022-05-29 DIAGNOSIS — L720 Epidermal cyst: Secondary | ICD-10-CM | POA: Diagnosis not present

## 2022-05-31 ENCOUNTER — Encounter (HOSPITAL_COMMUNITY)
Admission: RE | Admit: 2022-05-31 | Discharge: 2022-05-31 | Disposition: A | Discharge status: Home / Self Care | Payer: Medicare HMO | Source: Ambulatory Visit | Attending: Gastroenterology | Admitting: Gastroenterology

## 2022-06-05 ENCOUNTER — Encounter (INDEPENDENT_AMBULATORY_CARE_PROVIDER_SITE_OTHER): Payer: Self-pay | Admitting: *Deleted

## 2022-06-05 ENCOUNTER — Ambulatory Visit (HOSPITAL_BASED_OUTPATIENT_CLINIC_OR_DEPARTMENT_OTHER): Payer: Medicare HMO | Admitting: Anesthesiology

## 2022-06-05 ENCOUNTER — Ambulatory Visit (HOSPITAL_COMMUNITY)
Admission: RE | Admit: 2022-06-05 | Discharge: 2022-06-05 | Disposition: A | Discharge status: Home / Self Care | Payer: Medicare HMO | Attending: Gastroenterology | Admitting: Gastroenterology

## 2022-06-05 ENCOUNTER — Encounter (HOSPITAL_COMMUNITY): Payer: Self-pay | Admitting: Gastroenterology

## 2022-06-05 ENCOUNTER — Ambulatory Visit (HOSPITAL_COMMUNITY): Payer: Medicare HMO | Admitting: Anesthesiology

## 2022-06-05 ENCOUNTER — Encounter (HOSPITAL_COMMUNITY)
Admission: RE | Disposition: A | Discharge status: Home / Self Care | Payer: Self-pay | Source: Home / Self Care | Attending: Gastroenterology

## 2022-06-05 DIAGNOSIS — D124 Benign neoplasm of descending colon: Secondary | ICD-10-CM | POA: Diagnosis not present

## 2022-06-05 DIAGNOSIS — K6389 Other specified diseases of intestine: Secondary | ICD-10-CM | POA: Diagnosis not present

## 2022-06-05 DIAGNOSIS — F17210 Nicotine dependence, cigarettes, uncomplicated: Secondary | ICD-10-CM | POA: Diagnosis not present

## 2022-06-05 DIAGNOSIS — Z1211 Encounter for screening for malignant neoplasm of colon: Secondary | ICD-10-CM

## 2022-06-05 DIAGNOSIS — K644 Residual hemorrhoidal skin tags: Secondary | ICD-10-CM | POA: Diagnosis not present

## 2022-06-05 DIAGNOSIS — D123 Benign neoplasm of transverse colon: Secondary | ICD-10-CM | POA: Diagnosis not present

## 2022-06-05 DIAGNOSIS — Z8546 Personal history of malignant neoplasm of prostate: Secondary | ICD-10-CM | POA: Diagnosis not present

## 2022-06-05 DIAGNOSIS — Z6836 Body mass index (BMI) 36.0-36.9, adult: Secondary | ICD-10-CM | POA: Insufficient documentation

## 2022-06-05 DIAGNOSIS — K5730 Diverticulosis of large intestine without perforation or abscess without bleeding: Secondary | ICD-10-CM

## 2022-06-05 DIAGNOSIS — M1990 Unspecified osteoarthritis, unspecified site: Secondary | ICD-10-CM | POA: Diagnosis not present

## 2022-06-05 DIAGNOSIS — D122 Benign neoplasm of ascending colon: Secondary | ICD-10-CM | POA: Insufficient documentation

## 2022-06-05 DIAGNOSIS — D120 Benign neoplasm of cecum: Secondary | ICD-10-CM | POA: Insufficient documentation

## 2022-06-05 DIAGNOSIS — K648 Other hemorrhoids: Secondary | ICD-10-CM | POA: Diagnosis not present

## 2022-06-05 DIAGNOSIS — I10 Essential (primary) hypertension: Secondary | ICD-10-CM | POA: Diagnosis not present

## 2022-06-05 DIAGNOSIS — K635 Polyp of colon: Secondary | ICD-10-CM | POA: Diagnosis not present

## 2022-06-05 DIAGNOSIS — G709 Myoneural disorder, unspecified: Secondary | ICD-10-CM | POA: Diagnosis not present

## 2022-06-05 HISTORY — PX: COLONOSCOPY WITH PROPOFOL: SHX5780

## 2022-06-05 HISTORY — PX: POLYPECTOMY: SHX5525

## 2022-06-05 HISTORY — PX: BIOPSY: SHX5522

## 2022-06-05 LAB — HM COLONOSCOPY

## 2022-06-05 SURGERY — COLONOSCOPY WITH PROPOFOL
Anesthesia: General

## 2022-06-05 MED ORDER — PROPOFOL 10 MG/ML IV BOLUS
INTRAVENOUS | Status: DC | PRN
  Administered 2022-06-05: 50 mg via INTRAVENOUS

## 2022-06-05 MED ORDER — LIDOCAINE HCL (CARDIAC) PF 100 MG/5ML IV SOSY
PREFILLED_SYRINGE | INTRAVENOUS | Status: DC | PRN
  Administered 2022-06-05: 50 mg via INTRAVENOUS

## 2022-06-05 MED ORDER — PROPOFOL 500 MG/50ML IV EMUL
INTRAVENOUS | Status: DC | PRN
  Administered 2022-06-05: 150 ug/kg/min via INTRAVENOUS
  Administered 2022-06-05: 80 ug/kg/min via INTRAVENOUS

## 2022-06-05 MED ORDER — LACTATED RINGERS IV SOLN: INTRAVENOUS | Status: DC | PRN

## 2022-06-05 MED ORDER — LACTATED RINGERS IV SOLN: INTRAVENOUS | Status: DC

## 2022-06-05 NOTE — Anesthesia Preprocedure Evaluation (Signed)
Anesthesia Evaluation  Patient identified by MRN, date of birth, ID band Patient awake    Reviewed: Allergy & Precautions, H&P , NPO status , Patient's Chart, lab work & pertinent test results, reviewed documented beta blocker date and time   Airway Mallampati: II  TM Distance: >3 FB Neck ROM: full    Dental no notable dental hx.    Pulmonary neg pulmonary ROS, Current Smoker and Patient abstained from smoking.,    Pulmonary exam normal breath sounds clear to auscultation       Cardiovascular Exercise Tolerance: Good hypertension, negative cardio ROS   Rhythm:regular Rate:Normal     Neuro/Psych Anxiety  Neuromuscular disease negative psych ROS   GI/Hepatic negative GI ROS, Neg liver ROS,   Endo/Other  Morbid obesity  Renal/GU negative Renal ROS  negative genitourinary   Musculoskeletal   Abdominal   Peds  Hematology negative hematology ROS (+)   Anesthesia Other Findings   Reproductive/Obstetrics negative OB ROS                             Anesthesia Physical  Anesthesia Plan  ASA: 3  Anesthesia Plan: General   Post-op Pain Management:    Induction:   PONV Risk Score and Plan: Propofol infusion  Airway Management Planned:   Additional Equipment:   Intra-op Plan:   Post-operative Plan:   Informed Consent: I have reviewed the patients History and Physical, chart, labs and discussed the procedure including the risks, benefits and alternatives for the proposed anesthesia with the patient or authorized representative who has indicated his/her understanding and acceptance.     Dental Advisory Given  Plan Discussed with: CRNA  Anesthesia Plan Comments:         Anesthesia Quick Evaluation

## 2022-06-05 NOTE — Op Note (Signed)
[facility] Patient Name: Gregory Diaz Procedure Date: 06/05/2022 12:05 PM MRN: [id number] Date of Birth: 11/30/1950 Attending MD: Maylon Peppers ,  CSN: [id number] Age: 71 Admit Type: Outpatient Procedure:                Colonoscopy Indications:              Screening for colorectal malignant neoplasm Providers:                Maylon Peppers, Charlsie Quest. Theda Sers RN, RN,                            Everardo Pacific, Randa Spike, Technician Referring MD:              Medicines:                Monitored Anesthesia Care Complications:            No immediate complications. Estimated Blood Loss:     Estimated blood loss: none. Procedure:                Pre-Anesthesia Assessment:                           - Prior to the procedure, a History and Physical                            was performed, and patient medications, allergies                            and sensitivities were reviewed. The patient's                            tolerance of previous anesthesia was reviewed.                           - The risks and benefits of the procedure and the                            sedation options and risks were discussed with the                            patient. All questions were answered and informed                            consent was obtained.                           - ASA Grade Assessment: III - A patient with severe                            systemic disease.                           After obtaining informed consent, the colonoscope                            was passed under direct vision. Throughout the  procedure, the patient's blood pressure, pulse, and                            oxygen saturations were monitored continuously. The                            [id number]-HQ190L ([id number]) scope was introduced through                            the anus and advanced to the the cecum, identified                            by appendiceal orifice and  ileocecal valve. The                            colonoscopy was performed without difficulty. The                            patient tolerated the procedure well. The quality                            of the bowel preparation was adequate. Scope In: 12:26:36 PM Scope Out: 1:20:43 PM Scope Withdrawal Time: 0 hours 34 minutes 21 seconds  Total Procedure Duration: 0 hours 54 minutes 7 seconds  Findings:      Hemorrhoids were found on perianal exam.      Nine sessile polyps were found in the transverse colon, ascending colon       and cecum. The polyps were 3 to 8 mm in size. These polyps were removed       with a cold snare. Resection and retrieval were complete.      A 2 mm polyp was found in the ascending colon. The polyp was sessile.       The polyp was removed with a cold biopsy forceps. Resection and       retrieval were complete.      Two sessile polyps were found in the descending colon. The polyps were 3       to 5 mm in size. These polyps were removed with a cold snare. Resection       and retrieval were complete.      A few small-mouthed diverticula were found in the sigmoid colon and       descending colon.      Non-bleeding external and internal hemorrhoids were found during       retroflexion. The hemorrhoids were medium-sized. Impression:               - Hemorrhoids found on perianal exam.                           - Nine 3 to 8 mm polyps in the transverse colon, in                            the ascending colon and in the cecum, removed with                            a  cold snare. Resected and retrieved.                           - One 2 mm polyp in the ascending colon, removed                            with a cold biopsy forceps. Resected and retrieved.                           - Two 3 to 5 mm polyps in the descending colon,                            removed with a cold snare. Resected and retrieved.                           - Diverticulosis in the sigmoid colon and  in the                            descending colon.                           - Non-bleeding external and internal hemorrhoids. Moderate Sedation:      Per Anesthesia Care Recommendation:           - Discharge patient to home (ambulatory).                           - Resume previous diet.                           - Await pathology results.                           - Repeat colonoscopy for surveillance based on                            pathology results. Procedure Code(s):        --- Professional ---                           [phone], Colonoscopy, flexible; with removal of                            tumor(s), polyp(s), or other lesion(s) by snare                            technique                           75916, 17, Colonoscopy, flexible; with biopsy,                            single or multiple Diagnosis Code(s):        --- Professional ---  K63.5, Polyp of colon                           Z12.11, Encounter for screening for malignant                            neoplasm of colon                           K64.8, Other hemorrhoids                           K57.30, Diverticulosis of large intestine without                            perforation or abscess without bleeding CPT copyright 2019 [employer] Association. All rights reserved. The codes documented in this report are preliminary and upon coder review may  be revised to meet current compliance requirements. Maylon Peppers, MD Maylon Peppers,  06/05/2022 1:27:03 PM This report has been signed electronically. Number of Addenda: 0

## 2022-06-05 NOTE — Transfer of Care (Signed)
Immediate Anesthesia Transfer of Care Note  Patient: Gregory A Ortman Sr.  Procedure(s) Performed: COLONOSCOPY WITH PROPOFOL POLYPECTOMY BIOPSY  Patient Location: Short Stay  Anesthesia Type:MAC  Level of Consciousness: awake and drowsy  Airway & Oxygen Therapy: Patient Spontanous Breathing and Patient connected to nasal cannula oxygen  Post-op Assessment: Report given to RN and Post -op Vital signs reviewed and stable  Post vital signs: Reviewed and stable  Last Vitals:  Vitals Value Taken Time  BP 87/51   Temp 97.5   Pulse 82   Resp 13   SpO2 94     Last Pain:  Vitals:   06/05/22 1224  TempSrc:   PainSc: 0-No pain         Complications: No notable events documented.

## 2022-06-05 NOTE — Discharge Instructions (Signed)
You are being discharged to home.  Resume your previous diet.  We are waiting for your pathology results.  Your physician has recommended a repeat colonoscopy for surveillance based on pathology results.  

## 2022-06-05 NOTE — H&P (Signed)
Gregory A Schimpf Sr. is an 71 y.o. male.   Chief Complaint: Screening colonoscopy HPI: 71 year old male with past medical history of anxiety, arthritis, MGUS, prostate cancer, hypertension,  coming for screening colonoscopy.  Last colonoscopy was performed in 2010 which was normal.  The patient denies having any complaints such as melena, hematochezia, abdominal pain or distention, change in her bowel movement consistency or frequency, no changes in her weight recently.  No family history of colorectal cancer.   Past Medical History:  Diagnosis Date   Abnormal SPEP 09/09/2015   Anxiety    Arthritis    DDD, low back   Early cataracts, bilateral    Enlarged prostate    Heart murmur    was told at age 20, but has not had any problems   Hypertension    MGUS (monoclonal gammopathy of unknown significance) 01/29/2016   Prostate cancer [facility])    Renal disorder     Past Surgical History:  Procedure Laterality Date   BACK SURGERY  2004, 2015   COLONOSCOPY     CYSTOSCOPY WITH INJECTION N/A 03/13/2022   Procedure: CYSTOSCOPY WITH INJECTION- BOTOX 100 units;  Surgeon: Cleon Gustin, MD;  Location: [facility];  Service: Urology;  Laterality: N/A;   EYE SURGERY     L eye- as a child    FRACTURE SURGERY Right    thumb   HEMORROIDECTOMY     HERNIA REPAIR     umbilical hernia repair   MASS EXCISION Right 07/11/2016   Procedure: EXCISION 6CM RIGHT SHOULDER MASS;  Surgeon: Vickie Epley, MD;  Location: [facility];  Service: Vascular;  Laterality: Right;   PROSTATE BIOPSY     TONSILLECTOMY     WOUND DEBRIDEMENT N/A 10/25/2018   Procedure: DEBRIDEMENT OF MUSCLE AND FASCIA AT UMBILICUS;  Surgeon: Virl Cagey, MD;  Location: [facility];  Service: General;  Laterality: N/A;    Family History  Problem Relation Age of Onset   Gallstones Father    Ulcers Father    Alcohol abuse Father    Diabetes Daughter    Diabetes Brother    Diabetes Sister    Breast cancer Sister    Social History:   reports that he has been smoking cigarettes. He has a 25.00 pack-year smoking history. He has never used smokeless tobacco. He reports that he does not drink alcohol and does not use drugs.  Allergies:  Allergies  Allergen Reactions   Penicillins Swelling and Other (See Comments)    Has patient had a PCN reaction causing immediate rash, facial/tongue/throat swelling, SOB or lightheadedness with hypotension: Yes Has patient had a PCN reaction causing severe rash involving mucus membranes or skin necrosis: No Has patient had a PCN reaction that required hospitalization No Has patient had a PCN reaction occurring within the last 10 years: No If all of the above answers are "NO", then may proceed with Cephalosporin use.     Medications Prior to Admission  Medication Sig Dispense Refill   acetaminophen (TYLENOL) 650 MG CR tablet Take 650-1,300 mg by mouth every 8 (eight) hours as needed for pain.     Cholecalciferol (VITAMIN D3 PO) Take 1 tablet by mouth daily.     fesoterodine (TOVIAZ) 8 MG TB24 tablet Take 1 tablet (8 mg total) by mouth daily. 30 tablet 11   lisinopril (ZESTRIL) 20 MG tablet Take 20 mg by mouth daily.     sulfamethoxazole-trimethoprim (BACTRIM DS) 800-160 MG tablet Take 1 tablet by mouth 2 (two)  times daily as needed (lipoma).     alfuzosin (UROXATRAL) 10 MG 24 hr tablet Take 1 tablet (10 mg total) by mouth in the morning and at bedtime. (Patient not taking: Reported on 05/29/2022) 180 tablet 3   polyethylene glycol-electrolytes (TRILYTE) 420 g solution Take 4,000 mLs by mouth as directed. 4000 mL 0   traMADol (ULTRAM) 50 MG tablet Take 1 tablet (50 mg total) by mouth every 6 (six) hours as needed. (Patient not taking: Reported on 05/29/2022) 15 tablet 0    No results found for this or any previous visit (from the past 48 hour(s)). No results found.  Review of Systems  All other systems reviewed and are negative.   Blood pressure (!) 117/104, pulse 63, temperature 98.4 F  (36.9 C), temperature source Oral, resp. rate (!) 27, height '6\' 2"'$  (1.88 m), weight 128.8 kg, SpO2 96 %. Physical Exam  GENERAL: The patient is AO x3, in no acute distress. HEENT: Head is normocephalic and atraumatic. EOMI are intact. Mouth is well hydrated and without lesions. NECK: Supple. No masses LUNGS: Clear to auscultation. No presence of rhonchi/wheezing/rales. Adequate chest expansion HEART: RRR, normal s1 and s2. ABDOMEN: Soft, nontender, no guarding, no peritoneal signs, and nondistended. BS +. No masses. EXTREMITIES: Without any cyanosis, clubbing, rash, lesions or edema. NEUROLOGIC: AOx3, no focal motor deficit. SKIN: no jaundice, no rashes  Assessment/Plan  71 year old male with past medical history of anxiety, arthritis, MGUS, prostate cancer, hypertension,  coming for screening colonoscopy. The patient is at average risk for colorectal cancer.  We will proceed with colonoscopy today.   Harvel Quale, MD 06/05/2022, 12:22 PM

## 2022-06-06 LAB — SURGICAL PATHOLOGY

## 2022-06-07 NOTE — Anesthesia Postprocedure Evaluation (Signed)
Anesthesia Post Note  Patient: Gregory A Woodcox Sr.  Procedure(s) Performed: COLONOSCOPY WITH PROPOFOL POLYPECTOMY BIOPSY  Patient location during evaluation: Phase II Anesthesia Type: General Level of consciousness: awake Pain management: pain level controlled Vital Signs Assessment: post-procedure vital signs reviewed and stable Respiratory status: spontaneous breathing and respiratory function stable Cardiovascular status: blood pressure returned to baseline and stable Postop Assessment: no headache and no apparent nausea or vomiting Anesthetic complications: no Comments: Late entry   No notable events documented.   Last Vitals:  Vitals:   06/05/22 1114 06/05/22 1325  BP: (!) 117/104 110/66  Pulse:  75  Resp:  18  Temp:  (!) 36.4 C  SpO2:  93%    Last Pain:  Vitals:   06/06/22 0854  TempSrc:   PainSc: 0-No pain                 Louann Sjogren

## 2022-06-08 ENCOUNTER — Encounter (HOSPITAL_COMMUNITY): Payer: Self-pay | Admitting: Gastroenterology

## 2022-06-15 DIAGNOSIS — Z6838 Body mass index (BMI) 38.0-38.9, adult: Secondary | ICD-10-CM | POA: Diagnosis not present

## 2022-06-15 DIAGNOSIS — N182 Chronic kidney disease, stage 2 (mild): Secondary | ICD-10-CM | POA: Diagnosis not present

## 2022-06-15 DIAGNOSIS — E6609 Other obesity due to excess calories: Secondary | ICD-10-CM | POA: Diagnosis not present

## 2022-06-15 DIAGNOSIS — R7309 Other abnormal glucose: Secondary | ICD-10-CM | POA: Diagnosis not present

## 2022-06-15 DIAGNOSIS — I10 Essential (primary) hypertension: Secondary | ICD-10-CM | POA: Diagnosis not present

## 2022-06-15 DIAGNOSIS — E785 Hyperlipidemia, unspecified: Secondary | ICD-10-CM | POA: Diagnosis not present

## 2022-06-15 DIAGNOSIS — G4700 Insomnia, unspecified: Secondary | ICD-10-CM | POA: Diagnosis not present

## 2022-06-19 DIAGNOSIS — L304 Erythema intertrigo: Secondary | ICD-10-CM | POA: Diagnosis not present

## 2022-06-22 DIAGNOSIS — Z6837 Body mass index (BMI) 37.0-37.9, adult: Secondary | ICD-10-CM | POA: Diagnosis not present

## 2022-06-22 DIAGNOSIS — E6609 Other obesity due to excess calories: Secondary | ICD-10-CM | POA: Diagnosis not present

## 2022-06-22 DIAGNOSIS — F419 Anxiety disorder, unspecified: Secondary | ICD-10-CM | POA: Diagnosis not present

## 2022-06-22 DIAGNOSIS — M542 Cervicalgia: Secondary | ICD-10-CM | POA: Diagnosis not present

## 2022-07-14 DIAGNOSIS — Z6836 Body mass index (BMI) 36.0-36.9, adult: Secondary | ICD-10-CM | POA: Diagnosis not present

## 2022-07-14 DIAGNOSIS — F419 Anxiety disorder, unspecified: Secondary | ICD-10-CM | POA: Diagnosis not present

## 2022-07-14 DIAGNOSIS — E6609 Other obesity due to excess calories: Secondary | ICD-10-CM | POA: Diagnosis not present

## 2022-07-14 DIAGNOSIS — E039 Hypothyroidism, unspecified: Secondary | ICD-10-CM | POA: Diagnosis not present

## 2022-07-14 DIAGNOSIS — E559 Vitamin D deficiency, unspecified: Secondary | ICD-10-CM | POA: Diagnosis not present

## 2022-07-14 DIAGNOSIS — E538 Deficiency of other specified B group vitamins: Secondary | ICD-10-CM | POA: Diagnosis not present

## 2022-07-29 DIAGNOSIS — G4733 Obstructive sleep apnea (adult) (pediatric): Secondary | ICD-10-CM | POA: Diagnosis not present

## 2022-07-31 ENCOUNTER — Other Ambulatory Visit: Payer: Medicare HMO

## 2022-07-31 ENCOUNTER — Telehealth: Payer: Self-pay

## 2022-07-31 DIAGNOSIS — R32 Unspecified urinary incontinence: Secondary | ICD-10-CM | POA: Diagnosis not present

## 2022-07-31 LAB — URINALYSIS, ROUTINE W REFLEX MICROSCOPIC
Bilirubin, UA: NEGATIVE
Glucose, UA: NEGATIVE
Ketones, UA: NEGATIVE
Leukocytes,UA: NEGATIVE
Nitrite, UA: NEGATIVE
Protein,UA: NEGATIVE
RBC, UA: NEGATIVE
Specific Gravity, UA: 1.01 (ref 1.005–1.030)
Urobilinogen, Ur: 0.2 mg/dL (ref 0.2–1.0)
pH, UA: 5.5 (ref 5.0–7.5)

## 2022-07-31 NOTE — Telephone Encounter (Signed)
Patient called advising he was having dysuria and wanted to know if he could come into office and drop off urine due to providers schedule being full.

## 2022-07-31 NOTE — Telephone Encounter (Signed)
FYI-see below- 

## 2022-07-31 NOTE — Telephone Encounter (Signed)
Urine results reviewed with Dr. Junious Silk. Urine noted to be negative. Spoke with wife who was advised for patient to increase fluids and f/u if he continues to have discomfort. Wife voiced understanding.

## 2022-08-04 ENCOUNTER — Other Ambulatory Visit: Payer: Self-pay

## 2022-08-04 DIAGNOSIS — C61 Malignant neoplasm of prostate: Secondary | ICD-10-CM | POA: Diagnosis not present

## 2022-08-05 LAB — PSA: Prostate Specific Ag, Serum: 0.4 ng/mL (ref 0.0–4.0)

## 2022-08-08 DIAGNOSIS — E1122 Type 2 diabetes mellitus with diabetic chronic kidney disease: Secondary | ICD-10-CM | POA: Diagnosis not present

## 2022-08-08 DIAGNOSIS — N041 Nephrotic syndrome with focal and segmental glomerular lesions: Secondary | ICD-10-CM | POA: Diagnosis not present

## 2022-08-08 DIAGNOSIS — D472 Monoclonal gammopathy: Secondary | ICD-10-CM | POA: Diagnosis not present

## 2022-08-08 DIAGNOSIS — R809 Proteinuria, unspecified: Secondary | ICD-10-CM | POA: Diagnosis not present

## 2022-08-08 DIAGNOSIS — N182 Chronic kidney disease, stage 2 (mild): Secondary | ICD-10-CM | POA: Diagnosis not present

## 2022-08-08 DIAGNOSIS — Z720 Tobacco use: Secondary | ICD-10-CM | POA: Diagnosis not present

## 2022-08-08 DIAGNOSIS — I129 Hypertensive chronic kidney disease with stage 1 through stage 4 chronic kidney disease, or unspecified chronic kidney disease: Secondary | ICD-10-CM | POA: Diagnosis not present

## 2022-08-08 DIAGNOSIS — C61 Malignant neoplasm of prostate: Secondary | ICD-10-CM | POA: Diagnosis not present

## 2022-08-08 DIAGNOSIS — D631 Anemia in chronic kidney disease: Secondary | ICD-10-CM | POA: Diagnosis not present

## 2022-08-10 DIAGNOSIS — Z6836 Body mass index (BMI) 36.0-36.9, adult: Secondary | ICD-10-CM | POA: Diagnosis not present

## 2022-08-10 DIAGNOSIS — E6609 Other obesity due to excess calories: Secondary | ICD-10-CM | POA: Diagnosis not present

## 2022-08-10 DIAGNOSIS — G894 Chronic pain syndrome: Secondary | ICD-10-CM | POA: Diagnosis not present

## 2022-08-11 ENCOUNTER — Ambulatory Visit (INDEPENDENT_AMBULATORY_CARE_PROVIDER_SITE_OTHER): Payer: Medicare HMO | Admitting: Urology

## 2022-08-11 ENCOUNTER — Encounter: Payer: Self-pay | Admitting: Urology

## 2022-08-11 VITALS — BP 133/64 | HR 87

## 2022-08-11 DIAGNOSIS — C61 Malignant neoplasm of prostate: Secondary | ICD-10-CM

## 2022-08-11 DIAGNOSIS — N3281 Overactive bladder: Secondary | ICD-10-CM

## 2022-08-11 DIAGNOSIS — R351 Nocturia: Secondary | ICD-10-CM | POA: Diagnosis not present

## 2022-08-11 DIAGNOSIS — N138 Other obstructive and reflux uropathy: Secondary | ICD-10-CM

## 2022-08-11 DIAGNOSIS — N401 Enlarged prostate with lower urinary tract symptoms: Secondary | ICD-10-CM

## 2022-08-11 MED ORDER — ALFUZOSIN HCL ER 10 MG PO TB24: 10.0000 mg | ORAL_TABLET | Freq: Two times a day (BID) | ORAL | 3 refills | Status: DC

## 2022-08-11 MED ORDER — TROSPIUM CHLORIDE ER 60 MG PO CP24
1.0000 | ORAL_CAPSULE | Freq: Every day | ORAL | 11 refills | Status: DC
Start: 2022-08-11 — End: 2022-08-30

## 2022-08-11 NOTE — Progress Notes (Signed)
post void residual=51

## 2022-08-11 NOTE — Progress Notes (Signed)
08/11/2022 8:52 AM   Gregory Faster Decaire Sr. 1951/04/23 [id number]  Referring provider: Sharilyn Sites, MD [address] [place],  [place] [address]  Followup prostate cancer, OAB and BPH   HPI: Gregory Diaz is a 71yo here for followup for prostate cancer, BPH and OAB. PSA decreased to 0.4 from 0.5. He cannot afford [name] but it works well for him for his BPH. He has a weaker stream for the past 5 days since he stopped his uroxatral. IPSS 15 QOL 3. Nocturia 3x. He has intermittently straining to urinate.    PMH: Past Medical History:  Diagnosis Date   Abnormal SPEP 09/09/2015   Anxiety    Arthritis    DDD, low back   Early cataracts, bilateral    Enlarged prostate    Heart murmur    was told at age 59, but has not had any problems   Hypertension    MGUS (monoclonal gammopathy of unknown significance) 01/29/2016   Prostate cancer [facility])    Renal disorder     Surgical History: Past Surgical History:  Procedure Laterality Date   BACK SURGERY  2004, 2015   BIOPSY  06/05/2022   Procedure: BIOPSY;  Surgeon: Harvel Quale, MD;  [facility];  Service: Gastroenterology;;   COLONOSCOPY     COLONOSCOPY WITH PROPOFOL N/A 06/05/2022   Procedure: COLONOSCOPY WITH PROPOFOL;  Surgeon: Harvel Quale, MD;  [facility];  Service: Gastroenterology;  Laterality: N/A;  115 ASA 2   CYSTOSCOPY WITH INJECTION N/A 03/13/2022   Procedure: CYSTOSCOPY WITH INJECTION- BOTOX 100 units;  Surgeon: Cleon Gustin, MD;  Location: [facility];  Service: Urology;  Laterality: N/A;   EYE SURGERY     L eye- as a child    FRACTURE SURGERY Right    thumb   HEMORROIDECTOMY     HERNIA REPAIR     umbilical hernia repair   MASS EXCISION Right 07/11/2016   Procedure: EXCISION 6CM RIGHT SHOULDER MASS;  Surgeon: Vickie Epley, MD;  Location: [facility];  Service: Vascular;  Laterality: Right;   POLYPECTOMY  06/05/2022   Procedure: POLYPECTOMY;  Surgeon: Harvel Quale, MD;  [facility];  Service: Gastroenterology;;   PROSTATE BIOPSY     TONSILLECTOMY     WOUND DEBRIDEMENT N/A 10/25/2018   Procedure: DEBRIDEMENT OF MUSCLE AND FASCIA AT UMBILICUS;  Surgeon: Virl Cagey, MD;  Location: [facility];  Service: General;  Laterality: N/A;    Home Medications:  Allergies as of 08/11/2022       Reactions   Penicillins Swelling, Other (See Comments)   Has patient had a PCN reaction causing immediate rash, facial/tongue/throat swelling, SOB or lightheadedness with hypotension: Yes Has patient had a PCN reaction causing severe rash involving mucus membranes or skin necrosis: No Has patient had a PCN reaction that required hospitalization No Has patient had a PCN reaction occurring within the last 10 years: No If all of the above answers are "NO", then may proceed with Cephalosporin use.        Medication List        Accurate as of August 11, 2022  8:52 AM. If you have any questions, ask your nurse or doctor.          acetaminophen 650 MG CR tablet Commonly known as: TYLENOL Take 650-1,300 mg by mouth every 8 (eight) hours as needed for pain.   alfuzosin 10 MG 24 hr tablet Commonly known as: UROXATRAL Take  1 tablet (10 mg total) by mouth in the morning and at bedtime.   fesoterodine 8 MG Tb24 tablet Commonly known as: TOVIAZ Take 1 tablet (8 mg total) by mouth daily.   lisinopril 20 MG tablet Commonly known as: ZESTRIL Take 20 mg by mouth daily.   polyethylene glycol-electrolytes 420 g solution Commonly known as: TriLyte Take 4,000 mLs by mouth as directed.   sulfamethoxazole-trimethoprim 800-160 MG tablet Commonly known as: BACTRIM DS Take 1 tablet by mouth 2 (two) times daily as needed (lipoma).   traMADol 50 MG tablet Commonly known as: Ultram Take 1 tablet (50 mg total) by mouth every 6 (six) hours as needed.   VITAMIN D3 PO Take 1 tablet by mouth daily.        Allergies:  Allergies  Allergen  Reactions   Penicillins Swelling and Other (See Comments)    Has patient had a PCN reaction causing immediate rash, facial/tongue/throat swelling, SOB or lightheadedness with hypotension: Yes Has patient had a PCN reaction causing severe rash involving mucus membranes or skin necrosis: No Has patient had a PCN reaction that required hospitalization No Has patient had a PCN reaction occurring within the last 10 years: No If all of the above answers are "NO", then may proceed with Cephalosporin use.     Family History: Family History  Problem Relation Age of Onset   Gallstones Father    Ulcers Father    Alcohol abuse Father    Diabetes Daughter    Diabetes Brother    Diabetes Sister    Breast cancer Sister     Social History:  reports that he has been smoking cigarettes. He has a 25.00 pack-year smoking history. He has never used smokeless tobacco. He reports that he does not drink alcohol and does not use drugs.  ROS: All other review of systems were reviewed and are negative except what is noted above in HPI  Physical Exam: BP 133/64   Pulse 87   Constitutional:  Alert and oriented, No acute distress. HEENT: [place] AT, moist mucus membranes.  Trachea midline, no masses. Cardiovascular: No clubbing, cyanosis, or edema. Respiratory: Normal respiratory effort, no increased work of breathing. GI: Abdomen is soft, nontender, nondistended, no abdominal masses GU: No CVA tenderness.  Lymph: No cervical or inguinal lymphadenopathy. Skin: No rashes, bruises or suspicious lesions. Neurologic: Grossly intact, no focal deficits, moving all 4 extremities. Psychiatric: Normal mood and affect.  Laboratory Data: Lab Results  Component Value Date   WBC 8.7 05/23/2020   HGB 15.2 05/23/2020   HCT 47.9 05/23/2020   MCV 86.5 05/23/2020   PLT 237 05/23/2020    Lab Results  Component Value Date   CREATININE 1.29 (H) 05/23/2020    No results found for: "PSA"  No results found for:  "TESTOSTERONE"  Lab Results  Component Value Date   HGBA1C 5.5 10/16/2018    Urinalysis    Component Value Date/Time   COLORURINE AMBER (A) 05/23/2020 1846   APPEARANCEUR Clear 07/31/2022 1352   LABSPEC 1.029 05/23/2020 1846   PHURINE 5.0 05/23/2020 1846   GLUCOSEU Negative 07/31/2022 1352   HGBUR NEGATIVE 05/23/2020 1846   BILIRUBINUR Negative 07/31/2022 1352   KETONESUR NEGATIVE 05/23/2020 1846   PROTEINUR Negative 07/31/2022 1352   PROTEINUR 100 (A) 05/23/2020 1846   UROBILINOGEN 0.2 05/12/2020 0917   UROBILINOGEN 0.2 08/22/2015 1609   NITRITE Negative 07/31/2022 1352   NITRITE NEGATIVE 05/23/2020 1846   LEUKOCYTESUR Negative 07/31/2022 1352   LEUKOCYTESUR NEGATIVE 05/23/2020 1846  Lab Results  Component Value Date   LABMICR Comment 07/31/2022   WBCUA None seen 03/10/2022   LABEPIT None seen 03/10/2022   MUCUS Present 03/10/2022   BACTERIA None seen 03/10/2022    Pertinent Imaging:  No results found for this or any previous visit.  No results found for this or any previous visit.  No results found for this or any previous visit.  No results found for this or any previous visit.  Results for orders placed during the hospital encounter of 08/22/15  US Renal  Narrative CLINICAL DATA:  Acute kidney injury  EXAM: RENAL / URINARY TRACT ULTRASOUND COMPLETE  COMPARISON:  None.  FINDINGS: Right Kidney:  Length: 15.6 cm. Numerous cysts in the upper and midpole all measuring less than or equal to 5 cm. No hydronephrosis. Normal echogenicity.  Left Kidney:  Length: 15.1 cm. Two lower pole cysts 1 measuring 4.4 cm in the other measuring 3.6 cm. No hydronephrosis. Normal echogenicity.  Bladder:  Appears normal for degree of bladder distention.  IMPRESSION: Bilateral simple cysts.  No acute abnormalities.   Electronically Signed By: Skipper Cliche M.D. On: 08/24/2015 08:28  No valid procedures specified. No results found for this or any  previous visit.  No results found for this or any previous visit.   Assessment & Plan:    1. OAB (overactive bladder) -We will trial sanctura '60mg'$  - POCT urinalysis dipstick  2. Benign prostatic hyperplasia with urinary obstruction -Restart uroxatral '10mg'$  BID  3. Nocturia -Uroxatral '10mg'$  BID  4. Prostate cancer ([place]) -RTC 6 months with PSA   No follow-ups on file.  Nicolette Bang, MD  [facility] [place]

## 2022-08-11 NOTE — Patient Instructions (Signed)

## 2022-08-14 LAB — POCT URINALYSIS DIPSTICK
Bilirubin, UA: NEGATIVE
Blood, UA: NEGATIVE
Glucose, UA: NEGATIVE
Ketones, UA: NEGATIVE
Leukocytes, UA: NEGATIVE
Nitrite, UA: NEGATIVE
Protein, UA: POSITIVE — AB
Spec Grav, UA: 1.02 (ref 1.010–1.025)
Urobilinogen, UA: 0.2 E.U./dL
pH, UA: 5 (ref 5.0–8.0)

## 2022-08-14 NOTE — Addendum Note (Signed)
Addended by: Iris Pert on: 08/14/2022 09:03 AM   Modules accepted: Orders

## 2022-08-21 ENCOUNTER — Telehealth: Payer: Self-pay

## 2022-08-21 NOTE — Telephone Encounter (Signed)
Patient called and left vm 10/6 after hours. He advised he is having issues with his prostate and wanted to know what he should do. He advised he would like a recommendation on what to do.

## 2022-08-21 NOTE — Telephone Encounter (Signed)
Returned call to patient. No answer. Message left to return call to office.

## 2022-08-23 ENCOUNTER — Ambulatory Visit (INDEPENDENT_AMBULATORY_CARE_PROVIDER_SITE_OTHER): Payer: Medicare HMO | Admitting: Physician Assistant

## 2022-08-23 ENCOUNTER — Other Ambulatory Visit: Payer: Self-pay

## 2022-08-23 VITALS — BP 151/77 | HR 68

## 2022-08-23 DIAGNOSIS — R32 Unspecified urinary incontinence: Secondary | ICD-10-CM | POA: Diagnosis not present

## 2022-08-23 DIAGNOSIS — N3281 Overactive bladder: Secondary | ICD-10-CM

## 2022-08-23 DIAGNOSIS — I509 Heart failure, unspecified: Secondary | ICD-10-CM | POA: Diagnosis not present

## 2022-08-23 LAB — BLADDER SCAN AMB NON-IMAGING: Scan Result: 11

## 2022-08-23 MED ORDER — FESOTERODINE FUMARATE ER 8 MG PO TB24: 8.0000 mg | ORAL_TABLET | Freq: Every day | ORAL | 6 refills | Status: AC

## 2022-08-23 NOTE — Telephone Encounter (Signed)
Patient in office to see to PA  today.

## 2022-08-23 NOTE — Patient Instructions (Addendum)
Call [employer] at [phone] for [place] assistance.  [employer] patient assistance: [phone]

## 2022-08-23 NOTE — Progress Notes (Signed)
Insurance called to request Gregory Diaz rx be filled as the generic since that is covered under his plan.  Spoke to pharmacy and they will fill rx as generic.  Resent rx to [facility] and patient informed.

## 2022-08-23 NOTE — Progress Notes (Signed)
post void residual=11

## 2022-08-23 NOTE — Progress Notes (Signed)
Assessment: 1. OAB (overactive bladder)  2. Urinary incontinence, unspecified type - BLADDER SCAN AMB NON-IMAGING - Urinalysis, Routine w reflex microscopic    Plan: At the patient's request, he will discontinue trospium and resume Toviaz 8 mg.  And a prescription is sent to his pharmacy.  During his office visit, I researched the patient assistance programs through [employer] and [employer] and the patient was provided with this information.  He will keep follow-up as already scheduled.     Chief Complaint: No chief complaint on file.   HPI: Gregory Diaz Sr. is a 71 y.o. male who presents for continued evaluation of OAB.  Since the patient's visit last week, he continues to have issues with incontinence and is adamant that Gregory Diaz is the only medication that has been helpful for him.  He has had a difficult time affording this prescription and is requesting any resources we have to help him with patient assistance.  States he is unhappy with Gregory Diaz which he recently tried.  He continues Uroxatrol.  He denies dysuria, burning, or change in frequency.  UA= 6-10 WBCs, no bacteria PVR= 11 ml IPSS=           QOL=  Portions of the above documentation were copied from a prior visit for review purposes only.  Allergies: Allergies  Allergen Reactions   Penicillins Swelling and Other (See Comments)    Has patient had a PCN reaction causing immediate rash, facial/tongue/throat swelling, SOB or lightheadedness with hypotension: Yes Has patient had a PCN reaction causing severe rash involving mucus membranes or skin necrosis: No Has patient had a PCN reaction that required hospitalization No Has patient had a PCN reaction occurring within the last 10 years: No If all of the above answers are "NO", then may proceed with Cephalosporin use.     PMH: Past Medical History:  Diagnosis Date   Abnormal SPEP 09/09/2015   Anxiety    Arthritis    DDD, low back   Early cataracts,  bilateral    Enlarged prostate    Heart murmur    was told at age 48, but has not had any problems   Hypertension    MGUS (monoclonal gammopathy of unknown significance) 01/29/2016   Prostate cancer [facility])    Renal disorder     PSH: Past Surgical History:  Procedure Laterality Date   BACK SURGERY  2004, 2015   BIOPSY  06/05/2022   Procedure: BIOPSY;  Surgeon: Harvel Quale, MD;  [facility];  Service: Gastroenterology;;   COLONOSCOPY     COLONOSCOPY WITH PROPOFOL N/A 06/05/2022   Procedure: COLONOSCOPY WITH PROPOFOL;  Surgeon: Harvel Quale, MD;  [facility];  Service: Gastroenterology;  Laterality: N/A;  115 ASA 2   CYSTOSCOPY WITH INJECTION N/A 03/13/2022   Procedure: CYSTOSCOPY WITH INJECTION- BOTOX 100 units;  Surgeon: Cleon Gustin, MD;  Location: [facility];  Service: Urology;  Laterality: N/A;   EYE SURGERY     L eye- as a child    FRACTURE SURGERY Right    thumb   HEMORROIDECTOMY     HERNIA REPAIR     umbilical hernia repair   MASS EXCISION Right 07/11/2016   Procedure: EXCISION 6CM RIGHT SHOULDER MASS;  Surgeon: Vickie Epley, MD;  Location: [facility];  Service: Vascular;  Laterality: Right;   POLYPECTOMY  06/05/2022   Procedure: POLYPECTOMY;  Surgeon: Harvel Quale, MD;  [facility];  Service: Gastroenterology;;  PROSTATE BIOPSY     TONSILLECTOMY     WOUND DEBRIDEMENT N/A 10/25/2018   Procedure: DEBRIDEMENT OF MUSCLE AND FASCIA AT UMBILICUS;  Surgeon: Virl Cagey, MD;  Location: [facility];  Service: General;  Laterality: N/A;    SH: Social History   Tobacco Use   Smoking status: Every Day    Packs/day: 0.50    Years: 50.00    Total pack years: 25.00    Types: Cigarettes   Smokeless tobacco: Never  Vaping Use   Vaping Use: Never used  Substance Use Topics   Alcohol use: No   Drug use: No    ROS: All other review of systems were reviewed and are negative except what is noted above in  HPI  PE: There were no vitals taken for this visit. GENERAL APPEARANCE:  Well appearing, well developed, well nourished, NAD HEENT:  Atraumatic, normocephalic NECK:  Supple. Trachea midline ABDOMEN:  Soft, non-tender, no masses EXTREMITIES:  Moves all extremities well, without clubbing, cyanosis, or edema NEUROLOGIC:  Alert and oriented x 3, normal gait, CN II-XII grossly intact MENTAL STATUS:  appropriate BACK:  Non-tender to palpation, No CVAT SKIN:  Warm, dry, and intact   Results: Laboratory Data: Lab Results  Component Value Date   WBC 8.7 05/23/2020   HGB 15.2 05/23/2020   HCT 47.9 05/23/2020   MCV 86.5 05/23/2020   PLT 237 05/23/2020    Lab Results  Component Value Date   CREATININE 1.29 (H) 05/23/2020     Lab Results  Component Value Date   HGBA1C 5.5 10/16/2018    Urinalysis    Component Value Date/Time   COLORURINE AMBER (A) 05/23/2020 1846   APPEARANCEUR Clear 07/31/2022 1352   LABSPEC 1.029 05/23/2020 1846   PHURINE 5.0 05/23/2020 1846   GLUCOSEU Negative 07/31/2022 1352   HGBUR NEGATIVE 05/23/2020 1846   BILIRUBINUR neg 08/14/2022 0901   BILIRUBINUR Negative 07/31/2022 1352   KETONESUR NEGATIVE 05/23/2020 1846   PROTEINUR Positive (A) 08/14/2022 0901   PROTEINUR Negative 07/31/2022 1352   PROTEINUR 100 (A) 05/23/2020 1846   UROBILINOGEN 0.2 08/14/2022 0901   UROBILINOGEN 0.2 08/22/2015 1609   NITRITE neg 08/14/2022 0901   NITRITE Negative 07/31/2022 1352   NITRITE NEGATIVE 05/23/2020 1846   LEUKOCYTESUR Negative 08/14/2022 0901   LEUKOCYTESUR Negative 07/31/2022 1352   LEUKOCYTESUR NEGATIVE 05/23/2020 1846    Lab Results  Component Value Date   LABMICR Comment 07/31/2022   WBCUA None seen 03/10/2022   LABEPIT None seen 03/10/2022   MUCUS Present 03/10/2022   BACTERIA None seen 03/10/2022    Pertinent Imaging: No results found for this or any previous visit.  No results found for this or any previous visit.  No results found for  this or any previous visit.  No results found for this or any previous visit.  Results for orders placed during the hospital encounter of 08/22/15  US Renal  Narrative CLINICAL DATA:  Acute kidney injury  EXAM: RENAL / URINARY TRACT ULTRASOUND COMPLETE  COMPARISON:  None.  FINDINGS: Right Kidney:  Length: 15.6 cm. Numerous cysts in the upper and midpole all measuring less than or equal to 5 cm. No hydronephrosis. Normal echogenicity.  Left Kidney:  Length: 15.1 cm. Two lower pole cysts 1 measuring 4.4 cm in the other measuring 3.6 cm. No hydronephrosis. Normal echogenicity.  Bladder:  Appears normal for degree of bladder distention.  IMPRESSION: Bilateral simple cysts.  No acute abnormalities.   Electronically Signed By: Skipper Cliche  M.D. On: 08/24/2015 08:28  No valid procedures specified. No results found for this or any previous visit.  No results found for this or any previous visit.  No results found for this or any previous visit (from the past 24 hour(s)).

## 2022-08-24 LAB — URINALYSIS, ROUTINE W REFLEX MICROSCOPIC
Bilirubin, UA: NEGATIVE
Glucose, UA: NEGATIVE
Leukocytes,UA: NEGATIVE
Nitrite, UA: NEGATIVE
Specific Gravity, UA: 1.02 (ref 1.005–1.030)
Urobilinogen, Ur: 0.2 mg/dL (ref 0.2–1.0)
pH, UA: 5 (ref 5.0–7.5)

## 2022-08-24 LAB — MICROSCOPIC EXAMINATION: Bacteria, UA: NONE SEEN

## 2022-09-07 DIAGNOSIS — L720 Epidermal cyst: Secondary | ICD-10-CM | POA: Diagnosis not present

## 2022-09-12 ENCOUNTER — Telehealth: Payer: Self-pay

## 2022-09-12 NOTE — Telephone Encounter (Signed)
Patient's daughter Anis Degidio calling to check on status of FMLA paperwork that was dropped off (Oct. 17?)to be completed.    Call back:  [phone]

## 2022-09-15 NOTE — Telephone Encounter (Signed)
Returned call to daughter to inquire about FMLA paperwork that is reported she left at office.  I do not have FMLA paperwork- I left message for daughter to return call and to inquire what FMLA is needed for.

## 2022-09-18 NOTE — Telephone Encounter (Signed)
I spoke with daughter, Myna Bright, in reference to [employer]. Discussed with daughter the need for FMLA and she wasn't sure she just wanted to request FMLA. I have discussed with Dr. Alyson Ingles and he reports no medical reason for daughter to have FMLA for patient. Informed daughter and voiced understanding.

## 2022-09-21 DIAGNOSIS — E782 Mixed hyperlipidemia: Secondary | ICD-10-CM | POA: Diagnosis not present

## 2022-09-21 DIAGNOSIS — E119 Type 2 diabetes mellitus without complications: Secondary | ICD-10-CM | POA: Diagnosis not present

## 2022-09-21 DIAGNOSIS — E538 Deficiency of other specified B group vitamins: Secondary | ICD-10-CM | POA: Diagnosis not present

## 2022-09-21 DIAGNOSIS — G4700 Insomnia, unspecified: Secondary | ICD-10-CM | POA: Diagnosis not present

## 2022-09-21 DIAGNOSIS — Z0001 Encounter for general adult medical examination with abnormal findings: Secondary | ICD-10-CM | POA: Diagnosis not present

## 2022-09-21 DIAGNOSIS — D472 Monoclonal gammopathy: Secondary | ICD-10-CM | POA: Diagnosis not present

## 2022-09-21 DIAGNOSIS — Z1331 Encounter for screening for depression: Secondary | ICD-10-CM | POA: Diagnosis not present

## 2022-09-21 DIAGNOSIS — Z6836 Body mass index (BMI) 36.0-36.9, adult: Secondary | ICD-10-CM | POA: Diagnosis not present

## 2022-09-21 DIAGNOSIS — E559 Vitamin D deficiency, unspecified: Secondary | ICD-10-CM | POA: Diagnosis not present

## 2022-11-22 ENCOUNTER — Ambulatory Visit (HOSPITAL_COMMUNITY)
Admission: RE | Admit: 2022-11-22 | Discharge: 2022-11-22 | Disposition: A | Discharge status: Home / Self Care | Payer: Medicare HMO | Source: Ambulatory Visit | Attending: Family Medicine | Admitting: Family Medicine

## 2022-11-22 ENCOUNTER — Other Ambulatory Visit (HOSPITAL_COMMUNITY): Payer: Self-pay | Admitting: Family Medicine

## 2022-11-22 DIAGNOSIS — M7989 Other specified soft tissue disorders: Secondary | ICD-10-CM

## 2022-11-22 DIAGNOSIS — I82412 Acute embolism and thrombosis of left femoral vein: Secondary | ICD-10-CM | POA: Diagnosis not present

## 2022-11-22 DIAGNOSIS — E6609 Other obesity due to excess calories: Secondary | ICD-10-CM | POA: Diagnosis not present

## 2022-11-22 DIAGNOSIS — Z6836 Body mass index (BMI) 36.0-36.9, adult: Secondary | ICD-10-CM | POA: Diagnosis not present

## 2022-11-22 DIAGNOSIS — I82432 Acute embolism and thrombosis of left popliteal vein: Secondary | ICD-10-CM | POA: Diagnosis not present

## 2022-11-22 DIAGNOSIS — I82442 Acute embolism and thrombosis of left tibial vein: Secondary | ICD-10-CM | POA: Diagnosis not present

## 2022-12-13 DIAGNOSIS — E6609 Other obesity due to excess calories: Secondary | ICD-10-CM | POA: Diagnosis not present

## 2022-12-13 DIAGNOSIS — Z6835 Body mass index (BMI) 35.0-35.9, adult: Secondary | ICD-10-CM | POA: Diagnosis not present

## 2022-12-13 DIAGNOSIS — I82409 Acute embolism and thrombosis of unspecified deep veins of unspecified lower extremity: Secondary | ICD-10-CM | POA: Diagnosis not present

## 2023-01-01 DIAGNOSIS — N182 Chronic kidney disease, stage 2 (mild): Secondary | ICD-10-CM | POA: Diagnosis not present

## 2023-02-05 DIAGNOSIS — R809 Proteinuria, unspecified: Secondary | ICD-10-CM | POA: Diagnosis not present

## 2023-02-05 DIAGNOSIS — E669 Obesity, unspecified: Secondary | ICD-10-CM | POA: Diagnosis not present

## 2023-02-05 DIAGNOSIS — N041 Nephrotic syndrome with focal and segmental glomerular lesions: Secondary | ICD-10-CM | POA: Diagnosis not present

## 2023-02-05 DIAGNOSIS — R319 Hematuria, unspecified: Secondary | ICD-10-CM | POA: Diagnosis not present

## 2023-02-05 DIAGNOSIS — I10 Essential (primary) hypertension: Secondary | ICD-10-CM | POA: Diagnosis not present

## 2023-02-05 DIAGNOSIS — C61 Malignant neoplasm of prostate: Secondary | ICD-10-CM | POA: Diagnosis not present

## 2023-02-07 ENCOUNTER — Ambulatory Visit (INDEPENDENT_AMBULATORY_CARE_PROVIDER_SITE_OTHER): Payer: Medicare HMO | Admitting: Urology

## 2023-02-07 ENCOUNTER — Encounter: Payer: Self-pay | Admitting: Urology

## 2023-02-07 VITALS — BP 119/87 | HR 89

## 2023-02-07 DIAGNOSIS — C61 Malignant neoplasm of prostate: Secondary | ICD-10-CM

## 2023-02-07 DIAGNOSIS — N138 Other obstructive and reflux uropathy: Secondary | ICD-10-CM

## 2023-02-07 DIAGNOSIS — R351 Nocturia: Secondary | ICD-10-CM | POA: Diagnosis not present

## 2023-02-07 DIAGNOSIS — N401 Enlarged prostate with lower urinary tract symptoms: Secondary | ICD-10-CM

## 2023-02-07 DIAGNOSIS — I509 Heart failure, unspecified: Secondary | ICD-10-CM | POA: Diagnosis not present

## 2023-02-07 DIAGNOSIS — N3281 Overactive bladder: Secondary | ICD-10-CM | POA: Diagnosis not present

## 2023-02-07 DIAGNOSIS — R310 Gross hematuria: Secondary | ICD-10-CM | POA: Diagnosis not present

## 2023-02-07 NOTE — Progress Notes (Unsigned)
02/07/2023 2:58 PM   Gregory Daddio Safran Sr. 1950-12-12 [id number]  Referring provider: Sharilyn Sites, [address] [employer],  [place] [address]  Gross hematuria    HPI: Gregory Diaz is a 72yo here for followup for prostate cancer and BPH. He had a DVT in Jan 2024 and was started on eliquis. He has had 3 episodes of gross hematuria since starting the eliquis. He has also passed multiple clots.  NO recent abdominal imaging. IPSS 14 QOL 2 on uroxatral 10mg  and toviaz 8mg  daily. He has rare urge incontinence. No hx of UTI. He has stable urinary frequency and urgency on toviaz 8mg    PMH: Past Medical History:  Diagnosis Date   Abnormal SPEP 09/09/2015   Anxiety    Arthritis    DDD, low back   Early cataracts, bilateral    Enlarged prostate    Heart murmur    was told at age 89, but has not had any problems   Hypertension    MGUS (monoclonal gammopathy of unknown significance) 01/29/2016   Prostate cancer [facility])    Renal disorder     Surgical History: Past Surgical History:  Procedure Laterality Date   BACK SURGERY  2004, 2015   BIOPSY  06/05/2022   Procedure: BIOPSY;  Surgeon: Harvel Quale, MD;  Location: [facility];  Service: Gastroenterology;;   COLONOSCOPY     COLONOSCOPY WITH PROPOFOL N/A 06/05/2022   Procedure: COLONOSCOPY WITH PROPOFOL;  Surgeon: Harvel Quale, MD;  Location: [facility];  Service: Gastroenterology;  Laterality: N/A;  115 ASA 2   CYSTOSCOPY WITH INJECTION N/A 03/13/2022   Procedure: CYSTOSCOPY WITH INJECTION- BOTOX 100 units;  Surgeon: Cleon Gustin, MD;  Location: [facility];  Service: Urology;  Laterality: N/A;   EYE SURGERY     L eye- as a child    FRACTURE SURGERY Right    thumb   HEMORROIDECTOMY     HERNIA REPAIR     umbilical hernia repair   MASS EXCISION Right 07/11/2016   Procedure: EXCISION 6CM RIGHT SHOULDER MASS;  Surgeon: Vickie Epley, MD;  Location: [facility];  Service: Vascular;  Laterality: Right;    POLYPECTOMY  06/05/2022   Procedure: POLYPECTOMY;  Surgeon: Harvel Quale, MD;  Location: [facility];  Service: Gastroenterology;;   PROSTATE BIOPSY     TONSILLECTOMY     WOUND DEBRIDEMENT N/A 10/25/2018   Procedure: DEBRIDEMENT OF MUSCLE AND FASCIA AT UMBILICUS;  Surgeon: Virl Cagey, MD;  Location: [facility];  Service: General;  Laterality: N/A;    Home Medications:  Allergies as of 02/07/2023       Reactions   Penicillins Swelling, Other (See Comments)   Has patient had a PCN reaction causing immediate rash, facial/tongue/throat swelling, SOB or lightheadedness with hypotension: Yes Has patient had a PCN reaction causing severe rash involving mucus membranes or skin necrosis: No Has patient had a PCN reaction that required hospitalization No Has patient had a PCN reaction occurring within the last 10 years: No If all of the above answers are "NO", then may proceed with Cephalosporin use.        Medication List        Accurate as of February 07, 2023  2:58 PM. If you have any questions, ask your nurse or doctor.          acetaminophen 650 MG CR tablet Commonly known as: TYLENOL Take 650-1,300 mg by mouth every 8 (eight) hours as needed for  pain.   alfuzosin 10 MG 24 hr tablet Commonly known as: UROXATRAL Take 1 tablet (10 mg total) by mouth in the morning and at bedtime.   fesoterodine 8 MG Tb24 tablet Commonly known as: TOVIAZ Take 1 tablet (8 mg total) by mouth daily.   lisinopril 20 MG tablet Commonly known as: ZESTRIL Take 20 mg by mouth daily.   polyethylene glycol-electrolytes 420 g solution Commonly known as: TriLyte Take 4,000 mLs by mouth as directed.   sulfamethoxazole-trimethoprim 800-160 MG tablet Commonly known as: BACTRIM DS Take 1 tablet by mouth 2 (two) times daily as needed (lipoma).   traMADol 50 MG tablet Commonly known as: Ultram Take 1 tablet (50 mg total) by mouth every 6 (six) hours as needed.   VITAMIN D3 PO Take 1  tablet by mouth daily.        Allergies:  Allergies  Allergen Reactions   Penicillins Swelling and Other (See Comments)    Has patient had a PCN reaction causing immediate rash, facial/tongue/throat swelling, SOB or lightheadedness with hypotension: Yes Has patient had a PCN reaction causing severe rash involving mucus membranes or skin necrosis: No Has patient had a PCN reaction that required hospitalization No Has patient had a PCN reaction occurring within the last 10 years: No If all of the above answers are "NO", then may proceed with Cephalosporin use.     Family History: Family History  Problem Relation Age of Onset   Gallstones Father    Ulcers Father    Alcohol abuse Father    Diabetes Daughter    Diabetes Brother    Diabetes Sister    Breast cancer Sister     Social History:  reports that he has been smoking cigarettes. He has a 25.00 pack-year smoking history. He has never used smokeless tobacco. He reports that he does not drink alcohol and does not use drugs.  ROS: All other review of systems were reviewed and are negative except what is noted above in HPI  Physical Exam: BP 119/87   Pulse 89   Constitutional:  Alert and oriented, No acute distress. HEENT: [place] AT, moist mucus membranes.  Trachea midline, no masses. Cardiovascular: No clubbing, cyanosis, or edema. Respiratory: Normal respiratory effort, no increased work of breathing. GI: Abdomen is soft, nontender, nondistended, no abdominal masses GU: No CVA tenderness.  Lymph: No cervical or inguinal lymphadenopathy. Skin: No rashes, bruises or suspicious lesions. Neurologic: Grossly intact, no focal deficits, moving all 4 extremities. Psychiatric: Normal mood and affect.  Laboratory Data: Lab Results  Component Value Date   WBC 8.7 05/23/2020   HGB 15.2 05/23/2020   HCT 47.9 05/23/2020   MCV 86.5 05/23/2020   PLT 237 05/23/2020    Lab Results  Component Value Date   CREATININE 1.29 (H)  05/23/2020    No results found for: "PSA"  No results found for: "TESTOSTERONE"  Lab Results  Component Value Date   HGBA1C 5.5 10/16/2018    Urinalysis    Component Value Date/Time   COLORURINE AMBER (A) 05/23/2020 1846   APPEARANCEUR Hazy (A) 08/23/2022 1406   LABSPEC 1.029 05/23/2020 1846   PHURINE 5.0 05/23/2020 1846   GLUCOSEU Negative 08/23/2022 1406   HGBUR NEGATIVE 05/23/2020 1846   BILIRUBINUR Negative 08/23/2022 1406   KETONESUR NEGATIVE 05/23/2020 1846   PROTEINUR Trace (A) 08/23/2022 1406   PROTEINUR 100 (A) 05/23/2020 1846   UROBILINOGEN 0.2 08/14/2022 0901   UROBILINOGEN 0.2 08/22/2015 1609   NITRITE Negative 08/23/2022 1406  NITRITE NEGATIVE 05/23/2020 1846   LEUKOCYTESUR Negative 08/23/2022 1406   LEUKOCYTESUR NEGATIVE 05/23/2020 1846    Lab Results  Component Value Date   LABMICR See below: 08/23/2022   WBCUA 6-10 (A) 08/23/2022   LABEPIT 0-10 08/23/2022   MUCUS Present 03/10/2022   BACTERIA None seen 08/23/2022    Pertinent Imaging: *** No results found for this or any previous visit.  No results found for this or any previous visit.  No results found for this or any previous visit.  No results found for this or any previous visit.  Results for orders placed during the hospital encounter of 08/22/15  US Renal  Narrative CLINICAL DATA:  Acute kidney injury  EXAM: RENAL / URINARY TRACT ULTRASOUND COMPLETE  COMPARISON:  None.  FINDINGS: Right Kidney:  Length: 15.6 cm. Numerous cysts in the upper and midpole all measuring less than or equal to 5 cm. No hydronephrosis. Normal echogenicity.  Left Kidney:  Length: 15.1 cm. Two lower pole cysts 1 measuring 4.4 cm in the other measuring 3.6 cm. No hydronephrosis. Normal echogenicity.  Bladder:  Appears normal for degree of bladder distention.  IMPRESSION: Bilateral simple cysts.  No acute abnormalities.   Electronically Signed By: Skipper Cliche M.D. On: 08/24/2015  08:28  No valid procedures specified. No results found for this or any previous visit.  No results found for this or any previous visit.   Assessment & Plan:    1. OAB (overactive bladder) ***  2. Prostate cancer (HCC) *** - PSA  3. Nocturia ***  4. Benign prostatic hyperplasia with urinary obstruction ***  5. Gross hematuria ***   No follow-ups on file.  Nicolette Bang, MD  [facility] [place]

## 2023-02-07 NOTE — Patient Instructions (Signed)

## 2023-02-08 MED ORDER — ALFUZOSIN HCL ER 10 MG PO TB24: 10.0000 mg | ORAL_TABLET | Freq: Two times a day (BID) | ORAL | 3 refills | Status: DC

## 2023-02-09 LAB — BASIC METABOLIC PANEL
BUN/Creatinine Ratio: 14 (ref 10–24)
BUN: 21 mg/dL (ref 8–27)
CO2: 19 mmol/L — ABNORMAL LOW (ref 20–29)
Calcium: 9.3 mg/dL (ref 8.6–10.2)
Chloride: 98 mmol/L (ref 96–106)
Creatinine, Ser: 1.51 mg/dL — ABNORMAL HIGH (ref 0.76–1.27)
Glucose: 94 mg/dL (ref 70–99)
Potassium: 4.1 mmol/L (ref 3.5–5.2)
Sodium: 134 mmol/L (ref 134–144)
eGFR: 48 mL/min/{1.73_m2} — ABNORMAL LOW (ref >59)

## 2023-02-09 LAB — PSA: Prostate Specific Ag, Serum: 0.4 ng/mL (ref 0.0–4.0)

## 2023-02-19 DIAGNOSIS — R319 Hematuria, unspecified: Secondary | ICD-10-CM | POA: Diagnosis not present

## 2023-02-19 DIAGNOSIS — Z6835 Body mass index (BMI) 35.0-35.9, adult: Secondary | ICD-10-CM | POA: Diagnosis not present

## 2023-02-19 DIAGNOSIS — E6609 Other obesity due to excess calories: Secondary | ICD-10-CM | POA: Diagnosis not present

## 2023-02-19 DIAGNOSIS — D472 Monoclonal gammopathy: Secondary | ICD-10-CM | POA: Diagnosis not present

## 2023-02-20 ENCOUNTER — Telehealth: Payer: Self-pay

## 2023-02-20 NOTE — Telephone Encounter (Signed)
-----   Message from Malen Gauze, MD sent at 02/20/2023  9:39 AM EDT ----- PSa normal. Creatinien mildly elevated. He needs to contact his PCP ----- Message ----- From: Gustavus Messing, LPN Sent: 01/26/4007   8:37 PM EDT To: Malen Gauze, MD  Please review

## 2023-02-20 NOTE — Telephone Encounter (Signed)
Patient called and made aware. Patient voiced understanding. 

## 2023-02-28 ENCOUNTER — Encounter (HOSPITAL_COMMUNITY): Payer: Self-pay

## 2023-02-28 ENCOUNTER — Ambulatory Visit (HOSPITAL_COMMUNITY)
Admission: RE | Admit: 2023-02-28 | Discharge: 2023-02-28 | Disposition: A | Discharge status: Home / Self Care | Payer: Medicare HMO | Source: Ambulatory Visit | Attending: Urology | Admitting: Urology

## 2023-03-12 ENCOUNTER — Ambulatory Visit (HOSPITAL_COMMUNITY)
Admission: RE | Admit: 2023-03-12 | Discharge: 2023-03-12 | Disposition: A | Discharge status: Home / Self Care | Payer: Medicare HMO | Source: Ambulatory Visit | Attending: Urology | Admitting: Urology

## 2023-03-12 DIAGNOSIS — R310 Gross hematuria: Secondary | ICD-10-CM | POA: Diagnosis not present

## 2023-03-12 DIAGNOSIS — K8020 Calculus of gallbladder without cholecystitis without obstruction: Secondary | ICD-10-CM | POA: Diagnosis not present

## 2023-03-12 DIAGNOSIS — K5730 Diverticulosis of large intestine without perforation or abscess without bleeding: Secondary | ICD-10-CM | POA: Diagnosis not present

## 2023-03-12 MED ORDER — IOHEXOL 300 MG/ML  SOLN
100.0000 mL | Freq: Once | INTRAMUSCULAR | Status: AC | PRN
  Administered 2023-03-12: 100 mL via INTRAVENOUS

## 2023-03-28 ENCOUNTER — Ambulatory Visit: Payer: Medicare HMO | Admitting: Urology

## 2023-03-28 VITALS — BP 112/77 | HR 87

## 2023-03-28 DIAGNOSIS — N400 Enlarged prostate without lower urinary tract symptoms: Secondary | ICD-10-CM

## 2023-03-28 DIAGNOSIS — R310 Gross hematuria: Secondary | ICD-10-CM | POA: Diagnosis not present

## 2023-03-28 DIAGNOSIS — C61 Malignant neoplasm of prostate: Secondary | ICD-10-CM

## 2023-03-28 LAB — MICROSCOPIC EXAMINATION
Bacteria, UA: NONE SEEN
RBC, Urine: >30 /hpf — AB (ref 0–2)

## 2023-03-28 LAB — URINALYSIS, ROUTINE W REFLEX MICROSCOPIC
Bilirubin, UA: NEGATIVE
Glucose, UA: NEGATIVE
Ketones, UA: NEGATIVE
Nitrite, UA: NEGATIVE
Specific Gravity, UA: 1.025 (ref 1.005–1.030)
Urobilinogen, Ur: 1 mg/dL (ref 0.2–1.0)
pH, UA: 5.5 (ref 5.0–7.5)

## 2023-03-28 MED ORDER — CIPROFLOXACIN HCL 500 MG PO TABS
500.0000 mg | ORAL_TABLET | Freq: Once | ORAL | Status: AC
  Administered 2023-03-28: 500 mg via ORAL

## 2023-03-28 MED ORDER — FINASTERIDE 5 MG PO TABS: 5.0000 mg | ORAL_TABLET | Freq: Every day | ORAL | 3 refills | Status: DC

## 2023-03-29 ENCOUNTER — Telehealth: Payer: Self-pay

## 2023-03-29 NOTE — Telephone Encounter (Signed)
Received voicemail and triage message that patient had questions about cipro medication.  Patient given 1 cipro tablet while in office after cystoscopy procedure. Discussed with wife that is standard and would only receive 1 post procedure. Wife voiced understanding and would relay message.

## 2023-04-03 ENCOUNTER — Encounter: Payer: Self-pay | Admitting: Urology

## 2023-04-03 NOTE — Patient Instructions (Signed)

## 2023-04-03 NOTE — Progress Notes (Signed)
   03/28/2023  CC: gross hemturia   HPI: Mr Menor is a 72yo here for cystoscopy for gross hematuria Blood pressure 112/77, pulse 87. NED. A&Ox3.   No respiratory distress   Abd soft, NT, ND Normal phallus with bilateral descended testicles  Cystoscopy Procedure Note  Patient identification was confirmed, informed consent was obtained, and patient was prepped using Betadine solution.  Lidocaine jelly was administered per urethral meatus.     Pre-Procedure: - Inspection reveals a normal caliber ureteral meatus.  Procedure: The flexible cystoscope was introduced without difficulty - No urethral strictures/lesions are present. - Enlarged prostate  - Normal bladder neck - Bilateral ureteral orifices identified - Bladder mucosa  reveals no ulcers, tumors, or lesions - No bladder stones - No trabeculation     Post-Procedure: - Patient tolerated the procedure well  Assessment/ Plan: Followup 3 months with PSA  No follow-ups on file.  Wilkie Aye, MD

## 2023-05-31 DIAGNOSIS — R7303 Prediabetes: Secondary | ICD-10-CM | POA: Diagnosis not present

## 2023-05-31 DIAGNOSIS — E6609 Other obesity due to excess calories: Secondary | ICD-10-CM | POA: Diagnosis not present

## 2023-05-31 DIAGNOSIS — D472 Monoclonal gammopathy: Secondary | ICD-10-CM | POA: Diagnosis not present

## 2023-05-31 DIAGNOSIS — I82409 Acute embolism and thrombosis of unspecified deep veins of unspecified lower extremity: Secondary | ICD-10-CM | POA: Diagnosis not present

## 2023-05-31 DIAGNOSIS — Z6834 Body mass index (BMI) 34.0-34.9, adult: Secondary | ICD-10-CM | POA: Diagnosis not present

## 2023-05-31 DIAGNOSIS — M1712 Unilateral primary osteoarthritis, left knee: Secondary | ICD-10-CM | POA: Diagnosis not present

## 2023-05-31 DIAGNOSIS — M79605 Pain in left leg: Secondary | ICD-10-CM | POA: Diagnosis not present

## 2023-06-07 DIAGNOSIS — G894 Chronic pain syndrome: Secondary | ICD-10-CM | POA: Diagnosis not present

## 2023-06-07 DIAGNOSIS — E6609 Other obesity due to excess calories: Secondary | ICD-10-CM | POA: Diagnosis not present

## 2023-06-07 DIAGNOSIS — M542 Cervicalgia: Secondary | ICD-10-CM | POA: Diagnosis not present

## 2023-06-07 DIAGNOSIS — M79605 Pain in left leg: Secondary | ICD-10-CM | POA: Diagnosis not present

## 2023-06-07 DIAGNOSIS — M1712 Unilateral primary osteoarthritis, left knee: Secondary | ICD-10-CM | POA: Diagnosis not present

## 2023-06-07 DIAGNOSIS — Z6833 Body mass index (BMI) 33.0-33.9, adult: Secondary | ICD-10-CM | POA: Diagnosis not present

## 2023-06-13 ENCOUNTER — Other Ambulatory Visit (HOSPITAL_COMMUNITY): Payer: Self-pay | Admitting: Family Medicine

## 2023-06-13 DIAGNOSIS — M79605 Pain in left leg: Secondary | ICD-10-CM

## 2023-06-20 ENCOUNTER — Ambulatory Visit (HOSPITAL_COMMUNITY)
Admission: RE | Admit: 2023-06-20 | Discharge: 2023-06-20 | Disposition: A | Discharge status: Home / Self Care | Payer: Medicare HMO | Source: Ambulatory Visit | Attending: Family Medicine | Admitting: Family Medicine

## 2023-06-20 DIAGNOSIS — M79605 Pain in left leg: Secondary | ICD-10-CM | POA: Insufficient documentation

## 2023-06-20 DIAGNOSIS — I82432 Acute embolism and thrombosis of left popliteal vein: Secondary | ICD-10-CM | POA: Diagnosis not present

## 2023-06-25 DIAGNOSIS — I82409 Acute embolism and thrombosis of unspecified deep veins of unspecified lower extremity: Secondary | ICD-10-CM | POA: Diagnosis not present

## 2023-06-25 DIAGNOSIS — G894 Chronic pain syndrome: Secondary | ICD-10-CM | POA: Diagnosis not present

## 2023-06-25 DIAGNOSIS — E6609 Other obesity due to excess calories: Secondary | ICD-10-CM | POA: Diagnosis not present

## 2023-06-25 DIAGNOSIS — Z6832 Body mass index (BMI) 32.0-32.9, adult: Secondary | ICD-10-CM | POA: Diagnosis not present

## 2023-06-25 DIAGNOSIS — M79605 Pain in left leg: Secondary | ICD-10-CM | POA: Diagnosis not present

## 2023-07-06 ENCOUNTER — Encounter: Payer: Self-pay | Admitting: Vascular Surgery

## 2023-07-06 ENCOUNTER — Ambulatory Visit: Payer: Medicare HMO | Admitting: Vascular Surgery

## 2023-07-06 VITALS — BP 106/74 | HR 85 | Temp 98.0°F | Resp 20 | Ht 74.0 in | Wt 234.0 lb

## 2023-07-06 DIAGNOSIS — I82402 Acute embolism and thrombosis of unspecified deep veins of left lower extremity: Secondary | ICD-10-CM

## 2023-07-06 NOTE — Progress Notes (Signed)
ASSESSMENT & PLAN   LEFT LOWER EXTREMITY DVT: This patient had an unprovoked left lower extremity DVT in January of this year.  This involved his left femoral vein, popliteal, vein, peroneal and posterior tibial veins.  His follow-up duplex in August showed partial recanalization of the veins with some residual thrombus.  He may remains on Eliquis which she has now been on for 7 months.  He is having symptoms I think are related to venous hypertension.  I have encouraged him to avoid prolonged sitting and standing.  We have discussed the importance of exercise.  I have recommended a knee-high compression stocking with a gradient of 20 to 30 mmHg.  We have discussed the importance of daily leg elevation and the proper positioning for this.  We also discussed the importance of maintaining a healthy weight.  Given that he is having some persistent symptoms on the left I would continue his Eliquis for a full year.  I will bring him back in January 2025 for a follow-up duplex scan.  At that time we can decide if we should discontinue his Eliquis.  In addition his as he does have significant varicose veins I have ordered a reflux test in addition to his DVT study.  We will make further recommendations pending these results.  I have explained that I will be retiring so he will be seen on the PA schedule at that time.  REASON FOR CONSULT:    Nonocclusive DVT.  The consult is requested by Dr. Assunta Found.  HPI:   Gregory Vanzante Torbert Sr. is a 72 y.o. male who was referred for follow-up of a left lower extremity DVT.  I have reviewed the records from the referring office.  He was found to have a left lower extremity DVT back in January of this year.  He was started on Eliquis at that time.  He had a follow-up study in August which showed partial recanalization of the veins but some residual thrombus.  He sent over because he is having some persistent symptoms in the left leg.  He describes some aching pain and  heaviness in the left leg which is aggravated by standing and relieved somewhat with elevation.  He also states his symptoms are worse when he is constipated.  With respect to the original DVT, his only risk factor is tobacco use.  He denies any recent long travel, injury to the leg, recent surgery, or history of cancer.  He has no family history of DVTs or clotting disorders.  Past Medical History:  Diagnosis Date   Abnormal SPEP 09/09/2015   Anxiety    Arthritis    DDD, low back   Early cataracts, bilateral    Enlarged prostate    Heart murmur    was told at age 46, but has not had any problems   Hypertension    MGUS (monoclonal gammopathy of unknown significance) 01/29/2016   Prostate cancer (HCC)    Renal disorder     Family History  Problem Relation Age of Onset   Gallstones Father    Ulcers Father    Alcohol abuse Father    Diabetes Daughter    Diabetes Brother    Diabetes Sister    Breast cancer Sister     SOCIAL HISTORY: Social History   Tobacco Use   Smoking status: Every Day    Current packs/day: 0.50    Average packs/day: 0.5 packs/day for 50.0 years (25.0 ttl pk-yrs)    Types: Cigarettes  Smokeless tobacco: Never  Substance Use Topics   Alcohol use: No    Allergies  Allergen Reactions   Penicillins Swelling and Other (See Comments)    Has patient had a PCN reaction causing immediate rash, facial/tongue/throat swelling, SOB or lightheadedness with hypotension: Yes Has patient had a PCN reaction causing severe rash involving mucus membranes or skin necrosis: No Has patient had a PCN reaction that required hospitalization No Has patient had a PCN reaction occurring within the last 10 years: No If all of the above answers are "NO", then may proceed with Cephalosporin use.     Current Outpatient Medications  Medication Sig Dispense Refill   acetaminophen (TYLENOL) 650 MG CR tablet Take 650-1,300 mg by mouth every 8 (eight) hours as needed for pain.      alfuzosin (UROXATRAL) 10 MG 24 hr tablet Take 1 tablet (10 mg total) by mouth in the morning and at bedtime. 180 tablet 3   Cholecalciferol (VITAMIN D3 PO) Take 1 tablet by mouth daily.     fesoterodine (TOVIAZ) 8 MG TB24 tablet Take 1 tablet (8 mg total) by mouth daily. 30 tablet 6   finasteride (PROSCAR) 5 MG tablet Take 1 tablet (5 mg total) by mouth daily. 90 tablet 3   lisinopril (ZESTRIL) 20 MG tablet Take 20 mg by mouth daily.     polyethylene glycol-electrolytes (TRILYTE) 420 g solution Take 4,000 mLs by mouth as directed. 4000 mL 0   sulfamethoxazole-trimethoprim (BACTRIM DS) 800-160 MG tablet Take 1 tablet by mouth 2 (two) times daily as needed (lipoma).     No current facility-administered medications for this visit.   Facility-Administered Medications Ordered in Other Visits  Medication Dose Route Frequency Provider Last Rate Last Admin   Chlorhexidine Gluconate Cloth 2 % PADS 6 each  6 each Topical Once Ancil Linsey, MD       And   Chlorhexidine Gluconate Cloth 2 % PADS 6 each  6 each Topical Once Ancil Linsey, MD        REVIEW OF SYSTEMS:  [X]  denotes positive finding, [ ]  denotes negative finding Cardiac  Comments:  Chest pain or chest pressure:    Shortness of breath upon exertion:    Short of breath when lying flat:    Irregular heart rhythm:        Vascular    Pain in calf, thigh, or hip brought on by ambulation:    Pain in feet at night that wakes you up from your sleep:     Blood clot in your veins:    Leg swelling:  x       Pulmonary    Oxygen at home:    Productive cough:     Wheezing:         Neurologic    Sudden weakness in arms or legs:     Sudden numbness in arms or legs:     Sudden onset of difficulty speaking or slurred speech:    Temporary loss of vision in one eye:     Problems with dizziness:         Gastrointestinal    Blood in stool:     Vomited blood:         Genitourinary    Burning when urinating:     Blood in urine:         Psychiatric    Major depression:         Hematologic    Bleeding problems:  Problems with blood clotting too easily:        Skin    Rashes or ulcers:        Constitutional    Fever or chills:    -  PHYSICAL EXAM:   Vitals:   07/06/23 1342  BP: 106/74  Pulse: 85  Resp: 20  Temp: 98 F (36.7 C)  SpO2: 96%  Weight: 234 lb (106.1 kg)  Height: 6\' 2"  (1.88 m)   Body mass index is 30.04 kg/m. GENERAL: The patient is a well-nourished male, in no acute distress. The vital signs are documented above. CARDIAC: There is a regular rate and rhythm.  VASCULAR: I do not detect carotid bruits. On the right side he has a palpable femoral pulse.  I cannot palpate popliteal or pedal pulses.  He does have a biphasic anterior tibial and posterior tibial signal on the right. On the left side he has a palpable femoral pulse and palpable dorsalis pedis pulse.  He has a biphasic posterior tibial signal with the Doppler. He has varicose veins in the left leg and also on the right.  He has mild bilateral lower extremity swelling. PULMONARY: There is good air exchange bilaterally without wheezing or rales. ABDOMEN: Soft and non-tender with normal pitched bowel sounds.  MUSCULOSKELETAL: There are no major deformities. NEUROLOGIC: No focal weakness or paresthesias are detected. SKIN: There are no ulcers or rashes noted. PSYCHIATRIC: The patient has a normal affect.  DATA:    VENOUS DUPLEX: I reviewed the left lower extremity venous duplex scan that was done on 11/22/2022.  This showed occlusive thrombus in the mid femoral vein extending into the popliteal vein, posterior tibial veins and peroneal veins.  FOLLOW-UP DUPLEX: I reviewed the follow-up duplex scan that was done on 06/20/2023.  This showed interval partial recanalization of the left femoral popliteal and calf veins.  There was some residual nonocclusive mural thrombus seen in the femoral vein and popliteal vein.    Waverly Ferrari [facility]

## 2023-07-12 ENCOUNTER — Other Ambulatory Visit: Payer: Self-pay

## 2023-07-12 ENCOUNTER — Emergency Department (HOSPITAL_COMMUNITY)
Admission: EM | Admit: 2023-07-12 | Discharge: 2023-07-12 | Disposition: A | Discharge status: Home / Self Care | Payer: Medicare HMO | Attending: Emergency Medicine | Admitting: Emergency Medicine

## 2023-07-12 ENCOUNTER — Encounter: Payer: Medicare HMO | Admitting: Vascular Surgery

## 2023-07-12 DIAGNOSIS — R6889 Other general symptoms and signs: Secondary | ICD-10-CM | POA: Diagnosis not present

## 2023-07-12 DIAGNOSIS — K5903 Drug induced constipation: Secondary | ICD-10-CM | POA: Insufficient documentation

## 2023-07-12 DIAGNOSIS — M79605 Pain in left leg: Secondary | ICD-10-CM | POA: Insufficient documentation

## 2023-07-12 DIAGNOSIS — E6609 Other obesity due to excess calories: Secondary | ICD-10-CM | POA: Diagnosis not present

## 2023-07-12 DIAGNOSIS — Z6832 Body mass index (BMI) 32.0-32.9, adult: Secondary | ICD-10-CM | POA: Diagnosis not present

## 2023-07-12 DIAGNOSIS — Z20828 Contact with and (suspected) exposure to other viral communicable diseases: Secondary | ICD-10-CM | POA: Diagnosis not present

## 2023-07-12 DIAGNOSIS — R0981 Nasal congestion: Secondary | ICD-10-CM | POA: Diagnosis not present

## 2023-07-12 DIAGNOSIS — Z7901 Long term (current) use of anticoagulants: Secondary | ICD-10-CM | POA: Diagnosis not present

## 2023-07-12 MED ORDER — LIDOCAINE 5 % EX PTCH
1.0000 | MEDICATED_PATCH | CUTANEOUS | Status: DC
  Administered 2023-07-12: 1 via TRANSDERMAL
  Filled 2023-07-12: qty 1

## 2023-07-12 MED ORDER — LIDOCAINE 5 % EX PTCH: 1.0000 | MEDICATED_PATCH | CUTANEOUS | 0 refills | Status: AC

## 2023-07-12 MED ORDER — DICLOFENAC SODIUM 1 % EX GEL: 4.0000 g | Freq: Four times a day (QID) | CUTANEOUS | 0 refills | Status: DC

## 2023-07-12 MED ORDER — POLYETHYLENE GLYCOL 3350 17 GM/SCOOP PO POWD: 17.0000 g | Freq: Every day | ORAL | 0 refills | Status: AC

## 2023-07-12 NOTE — ED Triage Notes (Signed)
Pt presents to ED with complaints of left leg pain, pt states he has a history of blood clots, states he feels left leg has been causing him pain. Was seen at [facility] the other day. Presented back to ED for continued pain, states pain is no worse than it has been.

## 2023-07-12 NOTE — ED Provider Notes (Signed)
[place] EMERGENCY DEPARTMENT AT [name] Provider Note   CSN: [id number] Arrival date & time: 07/12/23  4098     History  Chief Complaint  Patient presents with   Leg Pain    Gregory Fus A Ozaki Sr. is a 72 y.o. male.  He has history of DVT in the left leg and is on Eliquis, follows with vascular clinic.  He has been having chronic pain related to this as well as varicose veins.  He had an appointment on 07/06/2023 for this.  Most recent ultrasound shows some recanalization with small amount of residual clot.  He is continuing on the Eliquis and they recommended he get compression stockings to help with his discomfort but they feels likely due to venous hypertension.  He is on oxycodone for the pain but states it does not help very much and is causing him to have constipation.  He is able to have bowel movements but has used laxatives to do so.  Denies abdominal pain, no fevers or chills.  No missed doses of Eliquis, no leg swelling, no numbness or tingling.  No back pain.   Leg Pain      Home Medications Prior to Admission medications   Medication Sig Start Date End Date Taking? Authorizing Provider  diclofenac Sodium (VOLTAREN) 1 % GEL Apply 4 g topically 4 (four) times daily. 07/12/23  Yes Tennyson Wacha A, PA-C  lidocaine (LIDODERM) 5 % Place 1 patch onto the skin daily. Remove & Discard patch within 12 hours or as directed by MD 07/12/23  Yes Cristi Loron, Cyd Hostler A, PA-C  polyethylene glycol powder (GLYCOLAX/MIRALAX) 17 GM/SCOOP powder Take 17 g by mouth daily. 07/12/23  Yes Muhsin Doris A, PA-C  acetaminophen (TYLENOL) 650 MG CR tablet Take 650-1,300 mg by mouth every 8 (eight) hours as needed for pain.    [provider]  alfuzosin (UROXATRAL) 10 MG 24 hr tablet Take 1 tablet (10 mg total) by mouth in the morning and at bedtime. 02/08/23   McKenzie, Mardene Dicy Smigel, MD  Cholecalciferol (VITAMIN D3 PO) Take 1 tablet by mouth daily.    [provider]   fesoterodine (TOVIAZ) 8 MG TB24 tablet Take 1 tablet (8 mg total) by mouth daily. 08/23/22   Summerlin, Regan Rakers, PA-C  finasteride (PROSCAR) 5 MG tablet Take 1 tablet (5 mg total) by mouth daily. 03/28/23   McKenzie, Mardene Kaleen Rochette, MD  lisinopril (ZESTRIL) 20 MG tablet Take 20 mg by mouth daily.    [provider]  polyethylene glycol-electrolytes (TRILYTE) 420 g solution Take 4,000 mLs by mouth as directed. 05/18/22   Dolores Frame, MD  sulfamethoxazole-trimethoprim (BACTRIM DS) 800-160 MG tablet Take 1 tablet by mouth 2 (two) times daily as needed (lipoma).    [provider]      Allergies    Penicillins    Review of Systems   Review of Systems  Physical Exam Updated Vital Signs BP (!) 132/96 (BP Location: Right Arm)   Pulse (!) 101   Temp 98.5 F (36.9 C) (Oral)   Resp 18   Ht 6\' 1"  (1.854 m)   Wt 108.9 kg   SpO2 98%   BMI 31.66 kg/m  Physical Exam Vitals and nursing note reviewed.  Constitutional:      General: He is not in acute distress.    Appearance: He is well-developed.  HENT:     Head: Normocephalic and atraumatic.     Mouth/Throat:     Mouth: Mucous membranes are  moist.  Eyes:     Conjunctiva/sclera: Conjunctivae normal.  Cardiovascular:     Rate and Rhythm: Normal rate and regular rhythm.     Heart sounds: No murmur heard. Pulmonary:     Effort: Pulmonary effort is normal. No respiratory distress.     Breath sounds: Normal breath sounds.  Abdominal:     Palpations: Abdomen is soft.     Tenderness: There is no abdominal tenderness.  Musculoskeletal:        General: No swelling. Normal range of motion.     Cervical back: Neck supple.     Comments: Mild left calf tenderness, varicose veins noted.  Skin:    General: Skin is warm and dry.     Capillary Refill: Capillary refill takes less than 2 seconds.  Neurological:     General: No focal deficit present.     Mental Status: He is alert and oriented to person, place, and  time.  Psychiatric:        Mood and Affect: Mood normal.        Behavior: Behavior normal.     ED Results / Procedures / Treatments   Labs (all labs ordered are listed, but only abnormal results are displayed) Labs Reviewed - No data to display  EKG None  Radiology No results found.  Procedures Procedures    Medications Ordered in ED Medications  lidocaine (LIDODERM) 5 % 1 patch (1 patch Transdermal Patch Applied 07/12/23 1020)    ED Course/ Medical Decision Making/ A&P Clinical Course as of 07/12/23 1050  Thu Jul 12, 2023  1013 Patient is here for chronic left leg pain related to DVT and venous hypertension, follows with vascular clinic.  He is recommended to use compression stocking and this has been ordered but has not come in yet.  He has no numbness or tingling, normal range of motion, no straight leg raise or back pain to suggest radiculopathy as source of the pain.  No joint swelling or pain.  There is no cellulitis.  Is his chronic pain, he is already on oxycodone, would not use chronic NSAIDs due to his anticoagulation.  Will provide with topical Lidoderm patches, he is continuing to elevate his leg and has already ordered his compression stocking.  Also concerned with his constipation due to the oxycodone, he is having bowel movements has no abdominal pain nausea or vomiting.  Will prescribe MiraLAX to start taking daily and advise he drink enough water and include fiber in his diet.  Do not feel he needs repeat ultrasound or other further workup at this time but will manage his symptoms [CB]    Clinical Course User Index [CB] Ma Rings, PA-C                                 Medical Decision Making DDx: DVT, varicose veins, muscle strain, radiculopathy, contusion, arthritis, other  ED course: Patient has chronic left leg pain related to DVT/varicose veins, follows with pain clinic, they have prescribed a compression stocking which is ordered and he has been  using elevation without relief.  He already takes oxycodone daily she states is making him constipated.  Prescribed MiraLAX for the constipation and instructed on fluid intake.  He has no abdominal pain vomiting or other concerning signs related to the constipation he is having bowel movements, just had to take laxatives to have a bowel movement.  Advised to follow-up with  his primary care doctor as he may need need referral for GI if it continues-though he had colonoscopy 1 year ago He did prescribed Lidoderm and Voltaren gel, discussed he may not get full relief with this may help, may need pain management if not improving with conservative measures.  He had no trauma, no increase in pain, no cellulitis or swelling, no joint pain.  Do not feel he needs a further workup at this time.  Patient's daughter is at bedside as well and aware of recommendations  Amount and/or Complexity of Data Reviewed External Data Reviewed: radiology and notes.           Final Clinical Impression(s) / ED Diagnoses Final diagnoses:  Left leg pain  Drug-induced constipation    Rx / DC Orders ED Discharge Orders          Ordered    lidocaine (LIDODERM) 5 %  Every 24 hours        07/12/23 1020    diclofenac Sodium (VOLTAREN) 1 % GEL  4 times daily        07/12/23 1020    polyethylene glycol powder (GLYCOLAX/MIRALAX) 17 GM/SCOOP powder  Daily        07/12/23 [address], PA-C 07/12/23 1050    Cathren Laine, MD 07/18/23 1931

## 2023-07-12 NOTE — Discharge Instructions (Signed)
Pleasure taking care of you today.  We saw you for your continued left leg pain.  We prescribed topical lidocaine patches and diclofenac gel which may help in conjunction with the compression stocking elevation.  If you are not getting relief please follow back up with your primary care doctor and/or a vein specialist.  You may ultimately need referral to pain management if you are not getting relief with these medications.  You also been having constipation, which may be related to the oxycodone you are taking.  I have prescribed MiraLAX powder.  Take 1 capful daily, you can take it twice a day if needed until you start having regular bowel movements.  Make sure you drink at least a full glass of water with a.  Increase your fiber intake.  Follow-up with your primary care doctor regarding the constipation as well. Come back to the ER if you have new or worsening symptoms.

## 2023-07-14 DIAGNOSIS — I82409 Acute embolism and thrombosis of unspecified deep veins of unspecified lower extremity: Secondary | ICD-10-CM | POA: Diagnosis not present

## 2023-07-14 DIAGNOSIS — M79605 Pain in left leg: Secondary | ICD-10-CM | POA: Diagnosis not present

## 2023-07-14 DIAGNOSIS — I10 Essential (primary) hypertension: Secondary | ICD-10-CM | POA: Diagnosis not present

## 2023-07-14 DIAGNOSIS — E782 Mixed hyperlipidemia: Secondary | ICD-10-CM | POA: Diagnosis not present

## 2023-07-14 DIAGNOSIS — G894 Chronic pain syndrome: Secondary | ICD-10-CM | POA: Diagnosis not present

## 2023-07-18 ENCOUNTER — Emergency Department (HOSPITAL_COMMUNITY)
Admission: EM | Admit: 2023-07-18 | Discharge: 2023-07-18 | Disposition: A | Discharge status: Home / Self Care | Payer: Medicare HMO | Attending: Emergency Medicine | Admitting: Emergency Medicine

## 2023-07-18 ENCOUNTER — Encounter (HOSPITAL_COMMUNITY): Payer: Self-pay

## 2023-07-18 ENCOUNTER — Other Ambulatory Visit: Payer: Self-pay

## 2023-07-18 DIAGNOSIS — I509 Heart failure, unspecified: Secondary | ICD-10-CM | POA: Insufficient documentation

## 2023-07-18 DIAGNOSIS — I110 Hypertensive heart disease with heart failure: Secondary | ICD-10-CM | POA: Insufficient documentation

## 2023-07-18 DIAGNOSIS — M79605 Pain in left leg: Secondary | ICD-10-CM | POA: Insufficient documentation

## 2023-07-18 DIAGNOSIS — M5432 Sciatica, left side: Secondary | ICD-10-CM | POA: Insufficient documentation

## 2023-07-18 DIAGNOSIS — Z79899 Other long term (current) drug therapy: Secondary | ICD-10-CM | POA: Diagnosis not present

## 2023-07-18 DIAGNOSIS — M5442 Lumbago with sciatica, left side: Secondary | ICD-10-CM | POA: Diagnosis not present

## 2023-07-18 MED ORDER — HYDROMORPHONE HCL 1 MG/ML IJ SOLN
1.0000 mg | Freq: Once | INTRAMUSCULAR | Status: AC
  Administered 2023-07-18: 1 mg via INTRAMUSCULAR
  Filled 2023-07-18: qty 1

## 2023-07-18 NOTE — ED Triage Notes (Signed)
C/o numbness/soreness to lef leg that is worse with ambulation.  Hx of DVT and seen for same on 8/29.

## 2023-07-18 NOTE — Discharge Instructions (Addendum)
Be sure to keep your appointment with your primary care provider for tomorrow.  Elevate your left leg when possible.  Use a walker or cane to help with balance.  Return to the emergency department for any new or worsening symptoms.

## 2023-07-18 NOTE — ED Provider Notes (Signed)
[place] EMERGENCY DEPARTMENT AT [facility] Provider Note   CSN: [id number] Arrival date & time: 07/18/23  1624     History  Chief Complaint  Patient presents with   Leg Pain    Gregory Fus A Hutsell Sr. is a 72 y.o. male.   Leg Pain Associated symptoms: back pain   Associated symptoms: no fever and no neck pain        Gregory A Moscoso Sr. is a 72 y.o. male with past medical history of radiculopathy, hypertension, CHF, who presents to the Emergency Department complaining of burning pain of left lower back radiating into his left leg.  States the pain radiates to the level of his foot.  History of same.  Was seen here on 07/12/2023 for similar symptoms.  No relief with previous treatment.  Has appointment with PCP tomorrow, here for pain control this evening.  Denies any abdominal pain, urine or bowel changes, fever or chills.  States he is currently on a blood thinner for a DVT of his left leg.  Home Medications Prior to Admission medications   Medication Sig Start Date End Date Taking? Authorizing Provider  acetaminophen (TYLENOL) 650 MG CR tablet Take 650-1,300 mg by mouth every 8 (eight) hours as needed for pain.    [provider]  alfuzosin (UROXATRAL) 10 MG 24 hr tablet Take 1 tablet (10 mg total) by mouth in the morning and at bedtime. 02/08/23   McKenzie, Mardene Celeste, MD  Cholecalciferol (VITAMIN D3 PO) Take 1 tablet by mouth daily.    [provider]  diclofenac Sodium (VOLTAREN) 1 % GEL Apply 4 g topically 4 (four) times daily. 07/12/23   Carmel Sacramento A, PA-C  fesoterodine (TOVIAZ) 8 MG TB24 tablet Take 1 tablet (8 mg total) by mouth daily. 08/23/22   Summerlin, Regan Rakers, PA-C  finasteride (PROSCAR) 5 MG tablet Take 1 tablet (5 mg total) by mouth daily. 03/28/23   McKenzie, Mardene Celeste, MD  lidocaine (LIDODERM) 5 % Place 1 patch onto the skin daily. Remove & Discard patch within 12 hours or as directed by MD 07/12/23   Carmel Sacramento A, PA-C   lisinopril (ZESTRIL) 20 MG tablet Take 20 mg by mouth daily.    [provider]  polyethylene glycol powder (GLYCOLAX/MIRALAX) 17 GM/SCOOP powder Take 17 g by mouth daily. 07/12/23   Carmel Sacramento A, PA-C  polyethylene glycol-electrolytes (TRILYTE) 420 g solution Take 4,000 mLs by mouth as directed. 05/18/22   Dolores Frame, MD  sulfamethoxazole-trimethoprim (BACTRIM DS) 800-160 MG tablet Take 1 tablet by mouth 2 (two) times daily as needed (lipoma).    [provider]      Allergies    Penicillins    Review of Systems   Review of Systems  Constitutional:  Negative for appetite change, chills and fever.  Respiratory:  Negative for shortness of breath.   Cardiovascular:  Negative for chest pain and leg swelling.  Gastrointestinal:  Negative for abdominal pain, nausea and vomiting.  Musculoskeletal:  Positive for back pain. Negative for neck pain and neck stiffness.  Skin:  Negative for color change.  Neurological:  Negative for weakness and numbness.    Physical Exam Updated Vital Signs BP 128/79   Pulse 79   Temp 98 F (36.7 C)   Resp 18   Ht 6\' 1"  (1.854 m)   Wt 106.6 kg   SpO2 92%   BMI 31.00 kg/m  Physical Exam Vitals and nursing note reviewed.  Constitutional:  General: He is not in acute distress.    Appearance: Normal appearance. He is not ill-appearing or toxic-appearing.  Cardiovascular:     Rate and Rhythm: Normal rate and regular rhythm.     Pulses: Normal pulses.     Comments: Palpable dorsalis pedis and posterior tibial pulses bilaterally Pulmonary:     Effort: Pulmonary effort is normal.  Abdominal:     Palpations: Abdomen is soft.     Tenderness: There is no abdominal tenderness. There is no right CVA tenderness or left CVA tenderness.  Musculoskeletal:     Lumbar back: Tenderness present. No swelling or bony tenderness. Decreased range of motion. Negative right straight leg raise test and negative left straight leg raise  test.     Comments: Tender to palpation left lumbar paraspinal muscles.  Left SI joint space.  Has full range of motion of the left hip  Skin:    General: Skin is warm.     Capillary Refill: Capillary refill takes less than 2 seconds.  Neurological:     General: No focal deficit present.     Mental Status: He is alert.     Sensory: No sensory deficit.     Motor: No weakness.     ED Results / Procedures / Treatments   Labs (all labs ordered are listed, but only abnormal results are displayed) Labs Reviewed - No data to display  EKG None  Radiology No results found.  Procedures Procedures    Medications Ordered in ED Medications - No data to display  ED Course/ Medical Decision Making/ A&P                                 Medical Decision Making Patient here for left low back and left leg pain, history of same.  Recently seen here for same.  No improvement with previous treatment has appointment with PCP tomorrow, here this evening for pain control.  Denies any new or worsening symptoms.   Symptoms likely related to sciatica, impingement, musculoskeletal injury, pain secondary to his recent DVT diagnosis, equina all considered.  Cauda equina is felt less likely as patient without neurologic symptoms, saddle anesthesias urine or bowel changes.  Amount and/or Complexity of Data Reviewed Discussion of management or test interpretation with external provider(s): Patient pain addressed here with IM medication.  On recheck, he is feeling better and states he is ready for discharge home.  He will follow-up with his PCP tomorrow.  No new or worsening symptoms or reported trauma to indicate need for imaging this evening. No red flags on exam, doubt emergent process  Risk Prescription drug management.           Final Clinical Impression(s) / ED Diagnoses Final diagnoses:  Left leg pain  Sciatica of left side    Rx / DC Orders ED Discharge Orders     None          Rosey Bath 07/18/23 2147    Terrilee Files, MD 07/19/23 1054

## 2023-07-19 DIAGNOSIS — M79605 Pain in left leg: Secondary | ICD-10-CM | POA: Diagnosis not present

## 2023-07-19 DIAGNOSIS — I82409 Acute embolism and thrombosis of unspecified deep veins of unspecified lower extremity: Secondary | ICD-10-CM | POA: Diagnosis not present

## 2023-07-19 DIAGNOSIS — Z6832 Body mass index (BMI) 32.0-32.9, adult: Secondary | ICD-10-CM | POA: Diagnosis not present

## 2023-07-19 DIAGNOSIS — E6609 Other obesity due to excess calories: Secondary | ICD-10-CM | POA: Diagnosis not present

## 2023-07-24 ENCOUNTER — Other Ambulatory Visit: Payer: Self-pay

## 2023-07-24 DIAGNOSIS — I82402 Acute embolism and thrombosis of unspecified deep veins of left lower extremity: Secondary | ICD-10-CM

## 2023-07-26 ENCOUNTER — Emergency Department (HOSPITAL_COMMUNITY)
Admission: EM | Admit: 2023-07-26 | Discharge: 2023-07-26 | Disposition: A | Discharge status: Home / Self Care | Payer: Medicare HMO | Attending: Emergency Medicine | Admitting: Emergency Medicine

## 2023-07-26 ENCOUNTER — Telehealth: Payer: Self-pay | Admitting: *Deleted

## 2023-07-26 ENCOUNTER — Other Ambulatory Visit: Payer: Self-pay

## 2023-07-26 ENCOUNTER — Encounter (HOSPITAL_COMMUNITY): Payer: Self-pay

## 2023-07-26 DIAGNOSIS — M79605 Pain in left leg: Secondary | ICD-10-CM | POA: Insufficient documentation

## 2023-07-26 DIAGNOSIS — G8929 Other chronic pain: Secondary | ICD-10-CM | POA: Diagnosis not present

## 2023-07-26 DIAGNOSIS — M5442 Lumbago with sciatica, left side: Secondary | ICD-10-CM | POA: Diagnosis not present

## 2023-07-26 DIAGNOSIS — M545 Low back pain, unspecified: Secondary | ICD-10-CM | POA: Diagnosis present

## 2023-07-26 DIAGNOSIS — Z79899 Other long term (current) drug therapy: Secondary | ICD-10-CM | POA: Insufficient documentation

## 2023-07-26 DIAGNOSIS — I110 Hypertensive heart disease with heart failure: Secondary | ICD-10-CM | POA: Diagnosis not present

## 2023-07-26 DIAGNOSIS — I509 Heart failure, unspecified: Secondary | ICD-10-CM | POA: Insufficient documentation

## 2023-07-26 LAB — COMPREHENSIVE METABOLIC PANEL
ALT: 36 U/L (ref 0–44)
AST: 40 U/L (ref 15–41)
Albumin: 3.2 g/dL — ABNORMAL LOW (ref 3.5–5.0)
Alkaline Phosphatase: 76 U/L (ref 38–126)
Anion gap: 12 (ref 5–15)
BUN: 27 mg/dL — ABNORMAL HIGH (ref 8–23)
CO2: 20 mmol/L — ABNORMAL LOW (ref 22–32)
Calcium: 10.8 mg/dL — ABNORMAL HIGH (ref 8.9–10.3)
Chloride: 101 mmol/L (ref 98–111)
Creatinine, Ser: 1.24 mg/dL (ref 0.61–1.24)
GFR, Estimated: >60 mL/min (ref >60)
Glucose, Bld: 131 mg/dL — ABNORMAL HIGH (ref 70–99)
Potassium: 3.9 mmol/L (ref 3.5–5.1)
Sodium: 133 mmol/L — ABNORMAL LOW (ref 135–145)
Total Bilirubin: 0.7 mg/dL (ref 0.3–1.2)
Total Protein: 6.9 g/dL (ref 6.5–8.1)

## 2023-07-26 LAB — URINALYSIS, ROUTINE W REFLEX MICROSCOPIC
Bilirubin Urine: NEGATIVE
Glucose, UA: NEGATIVE mg/dL
Ketones, ur: NEGATIVE mg/dL
Leukocytes,Ua: NEGATIVE
Nitrite: NEGATIVE
Protein, ur: NEGATIVE mg/dL
Specific Gravity, Urine: 1.019 (ref 1.005–1.030)
pH: 5 (ref 5.0–8.0)

## 2023-07-26 LAB — CBC
HCT: 46.5 % (ref 39.0–52.0)
Hemoglobin: 14.9 g/dL (ref 13.0–17.0)
MCH: 26.9 pg (ref 26.0–34.0)
MCHC: 32 g/dL (ref 30.0–36.0)
MCV: 84.1 fL (ref 80.0–100.0)
Platelets: 363 10*3/uL (ref 150–400)
RBC: 5.53 MIL/uL (ref 4.22–5.81)
RDW: 12.7 % (ref 11.5–15.5)
WBC: 6.2 10*3/uL (ref 4.0–10.5)
nRBC: 0 % (ref 0.0–0.2)

## 2023-07-26 LAB — LIPASE, BLOOD: Lipase: 26 U/L (ref 11–51)

## 2023-07-26 LAB — I-STAT CG4 LACTIC ACID, ED: Lactic Acid, Venous: 2.5 mmol/L (ref 0.5–1.9)

## 2023-07-26 MED ORDER — METHYLPREDNISOLONE 4 MG PO TBPK: ORAL_TABLET | ORAL | 0 refills | Status: DC

## 2023-07-26 MED ORDER — DEXAMETHASONE SODIUM PHOSPHATE 10 MG/ML IJ SOLN
10.0000 mg | Freq: Once | INTRAMUSCULAR | Status: AC
  Administered 2023-07-26: 10 mg via INTRAMUSCULAR
  Filled 2023-07-26: qty 1

## 2023-07-26 NOTE — Telephone Encounter (Signed)
Received call back from pt. Pt complaint of burning pain from the hip to the leg. Pt recently went to the ED and in the note stated sciatica back pain. Recommended pt to call PCP for potential referral for neurology.Instructed patient to contact primary care MD and Instructed to call back if needed.

## 2023-07-26 NOTE — ED Notes (Signed)
Patient reports intermittent left leg pain, reports pain is random in areas that pain starts. Reports pain can start in hip, or calf, or foot. Ambulation increases pain.

## 2023-07-26 NOTE — ED Provider Notes (Signed)
[place] EMERGENCY DEPARTMENT AT [facility] Provider Note   CSN: [id number] Arrival date & time: 07/26/23  1759     History  Chief Complaint  Patient presents with   Leg Pain    Gregory Fus A Chou Sr. is a 72 y.o. male.   Leg Pain Associated symptoms: back pain   Associated symptoms: no fever and no neck pain        Gregory A Auletta Sr. is a 72 y.o. male with past medical history of radiculopathy, hypertension, CHF, who presents to the Emergency Department complaining of burning pain of left lower back radiating into his left leg.  States the pain radiates to the level of his foot.  History of same.  Was seen here on 07/12/2023 for similar symptoms.  No relief with previous treatment.  Has appointment with PCP tomorrow, here for pain control this evening.  Denies any abdominal pain, urine or bowel changes, fever or chills.  States he is currently on a blood thinner for a DVT of his left leg.  Home Medications Prior to Admission medications   Medication Sig Start Date End Date Taking? Authorizing Provider  methylPREDNISolone (MEDROL DOSEPAK) 4 MG TBPK tablet Take as directed per package instructions 07/26/23  Yes Darrick Grinder, PA-C  acetaminophen (TYLENOL) 650 MG CR tablet Take 650-1,300 mg by mouth every 8 (eight) hours as needed for pain.    [provider]  alfuzosin (UROXATRAL) 10 MG 24 hr tablet Take 1 tablet (10 mg total) by mouth in the morning and at bedtime. 02/08/23   McKenzie, Mardene Celeste, MD  Cholecalciferol (VITAMIN D3 PO) Take 1 tablet by mouth daily.    [provider]  diclofenac Sodium (VOLTAREN) 1 % GEL Apply 4 g topically 4 (four) times daily. 07/12/23   Carmel Sacramento A, PA-C  fesoterodine (TOVIAZ) 8 MG TB24 tablet Take 1 tablet (8 mg total) by mouth daily. 08/23/22   Summerlin, Regan Rakers, PA-C  finasteride (PROSCAR) 5 MG tablet Take 1 tablet (5 mg total) by mouth daily. 03/28/23   McKenzie, Mardene Celeste, MD  lidocaine (LIDODERM) 5  % Place 1 patch onto the skin daily. Remove & Discard patch within 12 hours or as directed by MD 07/12/23   Carmel Sacramento A, PA-C  lisinopril (ZESTRIL) 20 MG tablet Take 20 mg by mouth daily.    [provider]  polyethylene glycol powder (GLYCOLAX/MIRALAX) 17 GM/SCOOP powder Take 17 g by mouth daily. 07/12/23   Carmel Sacramento A, PA-C  polyethylene glycol-electrolytes (TRILYTE) 420 g solution Take 4,000 mLs by mouth as directed. 05/18/22   Dolores Frame, MD  sulfamethoxazole-trimethoprim (BACTRIM DS) 800-160 MG tablet Take 1 tablet by mouth 2 (two) times daily as needed (lipoma).    [provider]      Allergies    Penicillins    Review of Systems   Review of Systems  Constitutional:  Negative for appetite change, chills and fever.  Respiratory:  Negative for shortness of breath.   Cardiovascular:  Negative for chest pain and leg swelling.  Gastrointestinal:  Negative for abdominal pain, nausea and vomiting.  Musculoskeletal:  Positive for back pain. Negative for neck pain and neck stiffness.  Skin:  Negative for color change.  Neurological:  Negative for weakness and numbness.    Physical Exam Updated Vital Signs BP 109/87 (BP Location: Left Arm)   Pulse 90   Temp 98.6 F (37 C) (Oral)   Resp 13   Ht 6\' 1"  (1.854  m)   Wt 59.9 kg   SpO2 98%   BMI 17.42 kg/m  Physical Exam Vitals and nursing note reviewed.  Constitutional:      General: He is not in acute distress.    Appearance: Normal appearance. He is not ill-appearing or toxic-appearing.  Cardiovascular:     Rate and Rhythm: Normal rate and regular rhythm.     Pulses: Normal pulses.     Comments: Palpable dorsalis pedis and posterior tibial pulses bilaterally Pulmonary:     Effort: Pulmonary effort is normal.  Abdominal:     Palpations: Abdomen is soft.     Tenderness: There is no abdominal tenderness. There is no right CVA tenderness or left CVA tenderness.  Musculoskeletal:     Lumbar  back: Tenderness present. No swelling or bony tenderness. Decreased range of motion. Negative right straight leg raise test and negative left straight leg raise test.     Comments: Tender to palpation left lumbar paraspinal muscles.  Left SI joint space.  Has full range of motion of the left hip  Skin:    General: Skin is warm.     Capillary Refill: Capillary refill takes less than 2 seconds.  Neurological:     General: No focal deficit present.     Mental Status: He is alert.     Sensory: No sensory deficit.     Motor: No weakness.     ED Results / Procedures / Treatments   Labs (all labs ordered are listed, but only abnormal results are displayed) Labs Reviewed  COMPREHENSIVE METABOLIC PANEL - Abnormal; Notable for the following components:      Result Value   Sodium 133 (*)    CO2 20 (*)    Glucose, Bld 131 (*)    BUN 27 (*)    Calcium 10.8 (*)    Albumin 3.2 (*)    All other components within normal limits  URINALYSIS, ROUTINE W REFLEX MICROSCOPIC - Abnormal; Notable for the following components:   APPearance HAZY (*)    Hgb urine dipstick MODERATE (*)    Bacteria, UA RARE (*)    All other components within normal limits  I-STAT CG4 LACTIC ACID, ED - Abnormal; Notable for the following components:   Lactic Acid, Venous 2.5 (*)    All other components within normal limits  LIPASE, BLOOD  CBC  I-STAT CG4 LACTIC ACID, ED    EKG None  Radiology No results found.  Procedures Procedures    Medications Ordered in ED Medications  dexamethasone (DECADRON) injection 10 mg (has no administration in time range)    ED Course/ Medical Decision Making/ A&P                                 Medical Decision Making Amount and/or Complexity of Data Reviewed Labs: ordered.  Risk Prescription drug management.   This patient presents to the ED for concern of low back pain with radiculopathy, this involves an extensive number of treatment options, and is a complaint that  carries with it a high risk of complications and morbidity.  The differential diagnosis includes fracture, dislocation, sciatica, cauda equina, others   Co morbidities that complicate the patient evaluation  History of radiculopathy   Additional history obtained:  Additional history obtained from family at bedside External records from outside source obtained and reviewed including primary care notes   Lab Tests:  I Ordered, and personally interpreted  labs.  The pertinent results include:  I-stat lactic acid 2.5, ordered from triage. Patient with no fever, no sign of infection, no leukocytosis   Problem List / ED Course / Critical interventions / Medication management   I ordered medication including decadron for inflammation  Reevaluation of the patient after these medicines showed that the patient improved I have reviewed the patients home medicines and have made adjustments as needed   Test / Admission - Considered:  Patient's symptoms and presentation consistent with lumbar radiculopathy. No trauma to suggest fracture, dislocation. The majority of pain is described as being "burning" in nature, in the leg. No red flags to necessitate imaging. Doubt fracture or dislocation. I considered antiinflammatory medication but the patient is currently on Eliquis. Plan to discharge home at this time with prescription for medrol dose pak, recommendations for continued follow up with primary care. Return precautions provided.          Final Clinical Impression(s) / ED Diagnoses Final diagnoses:  Chronic left-sided low back pain with left-sided sciatica    Rx / DC Orders ED Discharge Orders          Ordered    methylPREDNISolone (MEDROL DOSEPAK) 4 MG TBPK tablet        07/26/23 2234               Pamala Duffel 07/26/23 2316    Mesner, Barbara Cower, MD 07/26/23 [phone]

## 2023-07-26 NOTE — Discharge Instructions (Signed)
You were seen tonight with symptoms consistent with sciatica. Please take the prescribed steroids and follow up with your primary team. If you develop any life threatening symptoms please return to the emergency department.

## 2023-07-26 NOTE — Telephone Encounter (Signed)
Returned phone call. Left voice mail to return call in regards to getting a earlier appointment due to increase pain.

## 2023-07-26 NOTE — ED Triage Notes (Signed)
Pt  c/o L flank pain x 3 weeks, worse today; denies N/V, denies fevers, denies urinary issues; sent by PCP for further evaluation

## 2023-08-02 ENCOUNTER — Other Ambulatory Visit (HOSPITAL_COMMUNITY): Payer: Self-pay | Admitting: Family Medicine

## 2023-08-02 DIAGNOSIS — R000 Tachycardia, unspecified: Secondary | ICD-10-CM | POA: Diagnosis not present

## 2023-08-02 DIAGNOSIS — R5383 Other fatigue: Secondary | ICD-10-CM | POA: Diagnosis not present

## 2023-08-02 DIAGNOSIS — Z6830 Body mass index (BMI) 30.0-30.9, adult: Secondary | ICD-10-CM | POA: Diagnosis not present

## 2023-08-02 DIAGNOSIS — E6609 Other obesity due to excess calories: Secondary | ICD-10-CM | POA: Diagnosis not present

## 2023-08-02 DIAGNOSIS — R2689 Other abnormalities of gait and mobility: Secondary | ICD-10-CM | POA: Diagnosis not present

## 2023-08-02 DIAGNOSIS — R292 Abnormal reflex: Secondary | ICD-10-CM

## 2023-08-03 DIAGNOSIS — C61 Malignant neoplasm of prostate: Secondary | ICD-10-CM | POA: Diagnosis not present

## 2023-08-03 DIAGNOSIS — M2550 Pain in unspecified joint: Secondary | ICD-10-CM | POA: Diagnosis not present

## 2023-08-03 DIAGNOSIS — M79605 Pain in left leg: Secondary | ICD-10-CM | POA: Diagnosis not present

## 2023-08-03 DIAGNOSIS — R2689 Other abnormalities of gait and mobility: Secondary | ICD-10-CM | POA: Diagnosis not present

## 2023-08-03 DIAGNOSIS — R700 Elevated erythrocyte sedimentation rate: Secondary | ICD-10-CM | POA: Diagnosis not present

## 2023-08-03 DIAGNOSIS — Z87441 Personal history of nephrotic syndrome: Secondary | ICD-10-CM | POA: Diagnosis not present

## 2023-08-03 DIAGNOSIS — R319 Hematuria, unspecified: Secondary | ICD-10-CM | POA: Diagnosis not present

## 2023-08-03 DIAGNOSIS — E20811 Secondary hypoparathyroidism in diseases classified elsewhere: Secondary | ICD-10-CM | POA: Diagnosis not present

## 2023-08-03 DIAGNOSIS — R5383 Other fatigue: Secondary | ICD-10-CM | POA: Diagnosis not present

## 2023-08-05 ENCOUNTER — Ambulatory Visit (HOSPITAL_COMMUNITY)
Admission: RE | Admit: 2023-08-05 | Discharge: 2023-08-05 | Disposition: A | Discharge status: Home / Self Care | Payer: Medicare HMO | Source: Ambulatory Visit | Attending: Family Medicine | Admitting: Family Medicine

## 2023-08-05 DIAGNOSIS — R2689 Other abnormalities of gait and mobility: Secondary | ICD-10-CM

## 2023-08-05 DIAGNOSIS — M545 Low back pain, unspecified: Secondary | ICD-10-CM | POA: Diagnosis not present

## 2023-08-05 DIAGNOSIS — R292 Abnormal reflex: Secondary | ICD-10-CM | POA: Diagnosis not present

## 2023-08-05 DIAGNOSIS — M47816 Spondylosis without myelopathy or radiculopathy, lumbar region: Secondary | ICD-10-CM | POA: Diagnosis not present

## 2023-08-05 DIAGNOSIS — R200 Anesthesia of skin: Secondary | ICD-10-CM | POA: Diagnosis not present

## 2023-08-05 DIAGNOSIS — M48061 Spinal stenosis, lumbar region without neurogenic claudication: Secondary | ICD-10-CM | POA: Diagnosis not present

## 2023-08-10 ENCOUNTER — Other Ambulatory Visit: Payer: Self-pay

## 2023-08-10 ENCOUNTER — Emergency Department (HOSPITAL_COMMUNITY)
Admission: EM | Admit: 2023-08-10 | Discharge: 2023-08-10 | Disposition: A | Discharge status: Home / Self Care | Payer: Medicare HMO

## 2023-08-10 ENCOUNTER — Emergency Department (HOSPITAL_COMMUNITY): Payer: Medicare HMO

## 2023-08-10 ENCOUNTER — Encounter (HOSPITAL_COMMUNITY): Payer: Self-pay

## 2023-08-10 DIAGNOSIS — M1712 Unilateral primary osteoarthritis, left knee: Secondary | ICD-10-CM | POA: Diagnosis not present

## 2023-08-10 DIAGNOSIS — Z79899 Other long term (current) drug therapy: Secondary | ICD-10-CM | POA: Insufficient documentation

## 2023-08-10 DIAGNOSIS — M79652 Pain in left thigh: Secondary | ICD-10-CM | POA: Diagnosis not present

## 2023-08-10 DIAGNOSIS — M5416 Radiculopathy, lumbar region: Secondary | ICD-10-CM | POA: Diagnosis not present

## 2023-08-10 DIAGNOSIS — R000 Tachycardia, unspecified: Secondary | ICD-10-CM | POA: Diagnosis not present

## 2023-08-10 DIAGNOSIS — G8929 Other chronic pain: Secondary | ICD-10-CM | POA: Diagnosis not present

## 2023-08-10 DIAGNOSIS — M1612 Unilateral primary osteoarthritis, left hip: Secondary | ICD-10-CM | POA: Diagnosis not present

## 2023-08-10 DIAGNOSIS — M79605 Pain in left leg: Secondary | ICD-10-CM | POA: Diagnosis not present

## 2023-08-10 MED ORDER — LACTATED RINGERS IV BOLUS
1000.0000 mL | Freq: Once | INTRAVENOUS | Status: AC
  Administered 2023-08-10: 1000 mL via INTRAVENOUS

## 2023-08-10 MED ORDER — ACETAMINOPHEN 500 MG PO TABS
1000.0000 mg | ORAL_TABLET | Freq: Once | ORAL | Status: AC
  Administered 2023-08-10: 1000 mg via ORAL
  Filled 2023-08-10: qty 2

## 2023-08-10 MED ORDER — HYDROMORPHONE HCL 1 MG/ML IJ SOLN
0.5000 mg | Freq: Once | INTRAMUSCULAR | Status: AC
  Administered 2023-08-10: 0.5 mg via INTRAVENOUS
  Filled 2023-08-10: qty 0.5

## 2023-08-10 NOTE — ED Provider Notes (Signed)
[place] EMERGENCY DEPARTMENT AT [facility] Provider Note   CSN: [id number] Arrival date & time: 08/10/23  1255     History  Chief Complaint  Patient presents with   Leg Pain    Gregory Fus A Tutterow Sr. is a 72 y.o. male with a history of spinal stenosis, left leg radiculopathy, presenting with concern for left leg pain that has been ongoing for 2 months.  States that the pain starts in his lower back and shots down his left leg to about his left knee.  He reports being on gabapentin and oxycodone from her his primary care provider for this pain which does help.  There has been no acute worsening of the pain. Denies any numbness or tingling, loss of bowel or bladder control, fever or chills, saddle anesthesia, calf pain or swelling.   Leg Pain      Home Medications Prior to Admission medications   Medication Sig Start Date End Date Taking? Authorizing Provider  acetaminophen (TYLENOL) 650 MG CR tablet Take 650-1,300 mg by mouth every 8 (eight) hours as needed for pain.    [provider]  alfuzosin (UROXATRAL) 10 MG 24 hr tablet Take 1 tablet (10 mg total) by mouth in the morning and at bedtime. 02/08/23   McKenzie, Mardene Celeste, MD  Cholecalciferol (VITAMIN D3 PO) Take 1 tablet by mouth daily.    [provider]  diclofenac Sodium (VOLTAREN) 1 % GEL Apply 4 g topically 4 (four) times daily. 07/12/23   Carmel Sacramento A, PA-C  fesoterodine (TOVIAZ) 8 MG TB24 tablet Take 1 tablet (8 mg total) by mouth daily. 08/23/22   Summerlin, Regan Rakers, PA-C  finasteride (PROSCAR) 5 MG tablet Take 1 tablet (5 mg total) by mouth daily. 03/28/23   McKenzie, Mardene Celeste, MD  lidocaine (LIDODERM) 5 % Place 1 patch onto the skin daily. Remove & Discard patch within 12 hours or as directed by MD 07/12/23   Carmel Sacramento A, PA-C  lisinopril (ZESTRIL) 20 MG tablet Take 20 mg by mouth daily.    [provider]  methylPREDNISolone (MEDROL DOSEPAK) 4 MG TBPK tablet Take as  directed per package instructions 07/26/23   Barrie Dunker B, PA-C  polyethylene glycol powder (GLYCOLAX/MIRALAX) 17 GM/SCOOP powder Take 17 g by mouth daily. 07/12/23   Carmel Sacramento A, PA-C  polyethylene glycol-electrolytes (TRILYTE) 420 g solution Take 4,000 mLs by mouth as directed. 05/18/22   Dolores Frame, MD  sulfamethoxazole-trimethoprim (BACTRIM DS) 800-160 MG tablet Take 1 tablet by mouth 2 (two) times daily as needed (lipoma).    [provider]      Allergies    Penicillins    Review of Systems   Review of Systems  Musculoskeletal:        Left leg pain    Physical Exam Updated Vital Signs BP (!) 117/96   Pulse 80   Temp 98.5 F (36.9 C) (Oral)   Resp 16   Ht 6\' 1"  (1.854 m)   Wt 60 kg   SpO2 97%   BMI 17.45 kg/m  Physical Exam Vitals and nursing note reviewed.  Constitutional:      Appearance: Normal appearance.  HENT:     Head: Atraumatic.  Cardiovascular:     Rate and Rhythm: Normal rate and regular rhythm.     Comments: 2+ dorsalis pedis pulses Pulmonary:     Effort: Pulmonary effort is normal.  Musculoskeletal:     Right lower leg: No edema.  Left lower leg: No edema.     Comments: No deformity, erythema, edema of the left lower extremity.  Left lower extremity appears symmetric with the right lower extremity.  No calf swelling or calf tenderness to palpation No tenderness to palpation of the left knee diffusely, no tenderness palpation along the left thigh diffusely Able to flex and extend the left knee without difficulty  Neurological:     General: No focal deficit present.     Mental Status: He is alert.     Comments: 5/5 strength of the left lower extremity with dorsiflexion, plantarflexion, knee extension, hip flexion Sensation intact in the lower extremity bilaterally  Psychiatric:        Mood and Affect: Mood normal.        Behavior: Behavior normal.     ED Results / Procedures / Treatments   Labs (all labs  ordered are listed, but only abnormal results are displayed) Labs Reviewed - No data to display  EKG None  Radiology DG Femur Min 2 Views Left  Result Date: 08/10/2023 CLINICAL DATA:  Left thigh pain. EXAM: LEFT FEMUR 2 VIEWS COMPARISON:  None Available. FINDINGS: There is no evidence of fracture or other focal bone lesions. No erosion or periostitis. Mild to moderate osteoarthritis of the knee. Minor osteoarthritis of the hip. Scattered vascular calcifications. Soft tissues are otherwise unremarkable. IMPRESSION: No findings to explain back pain. Mild to moderate osteoarthritis of the knee. Minor osteoarthritis of the hip. Electronically Signed   By: Narda Rutherford M.D.   On: 08/10/2023 16:08    Procedures Procedures    Medications Ordered in ED Medications  acetaminophen (TYLENOL) tablet 1,000 mg (1,000 mg Oral Given 08/10/23 1342)  lactated ringers bolus 1,000 mL (1,000 mLs Intravenous New Bag/Given 08/10/23 1411)  HYDROmorphone (DILAUDID) injection 0.5 mg (0.5 mg Intravenous Given 08/10/23 1442)    ED Course/ Medical Decision Making/ A&P                                 Medical Decision Making Amount and/or Complexity of Data Reviewed Radiology: ordered.  Risk OTC drugs. Prescription drug management.   72 y.o. male with pertinent past medical history of spinal stenosis, left leg radiculopathy,  presents to the ED for concern of left leg pain ongoing for 2 months, no acute worsening  Differential diagnosis includes but is not limited to fracture, dislocation, osteo arthritis, radiculopathy, herniated nucleus pulposus  ED Course:  Patient overall well-appearing with no obvious deformity of the left lower leg, no tenderness to palpation, full range of motion.  Reports he did not take his home gabapentin and oxycodone since last night, and this morning he was in severe pain which prompted his family to bring him into the ER.  His pain has been unchanged for the past 2 months,  no acute worsening today.  No loss of bowel or bladder control, saddle anesthesia, fever or chills, no concern for cauda equina at this time.  The description of the pain starting the lower back and shooting down the back of his leg seems consistent with history of radiculopathy.  However, x-ray of the left femur was obtained to rule out any acute abnormality.  X-ray without any signs of acute abnormality.  Patient was given 1 L lactated ringer and 0.5 mg Dilaudid for pain.  Patient was ambulated with a walker, he was able to ambulate without difficulty and at his baseline per  patient and the daughter in the room. Patient just got oxycodone filled and has about 25 tablets. No further refill needed at this time Patient appropriate for discharge at this time with follow-up with his PCP to review the MRI results.   Impression: Left lower extremity radiculopathy  Disposition:  The patient was discharged home with instructions to take home gabapentin, oxycodone, Tylenol for pain.  Follow-up with PCP to review MRI results. Return precautions given.  Imaging Studies ordered: I ordered imaging studies including x-ray left femur I independently visualized the imaging with scope of interpretation limited to determining acute life threatening conditions related to emergency care. Imaging showed no acute osseous abnormalities I agree with the radiologist interpretation   Cardiac Monitoring: / EKG: The patient was maintained on a cardiac monitor.  I personally viewed and interpreted the cardiac monitored which showed an underlying rhythm of: Normal sinus rhythm  External records from outside source obtained and reviewed including ER note from 9/12,  9/4, and 8/29 where he was seen for the same complaint.  Reviewed imaging which reveals he had an MRI lumbar spine completed on 08/05/23 with his primary provider            Final Clinical Impression(s) / ED Diagnoses Final diagnoses:  Chronic  left-sided lumbar radiculopathy    Rx / DC Orders ED Discharge Orders     None         Arabella Merles, PA-C 08/10/23 1618    Coral Spikes, DO 08/13/23 1648

## 2023-08-10 NOTE — ED Notes (Signed)
Ambulated pt with walker Pt uses walker at home Pt stated that he is "walking normal just slow" for himself Complains of "lightening pain" down both legs PA witness ambulation  Reattached to partial monitor  Vitals listed.

## 2023-08-10 NOTE — ED Notes (Signed)
Vitals assessed IV access established Labs drawn Fluids started Yellow socks given

## 2023-08-10 NOTE — ED Triage Notes (Signed)
Pt brought from home via EMS Complains of LEFT leg pain Pain behind knee radiates up to LEFT hip No pain at rest Pain 10/10 and unable to walk when standing Pain started at 4am

## 2023-08-10 NOTE — Discharge Instructions (Addendum)
Your pain is likely due to a radiculopathy (nerve irritation).  Today you were given Tylenol and an IV medication similar to your home oxycodone.  Please take the gabapentin as prescribed for pain.  You may take up to 1000mg  of tylenol every 6 hours as needed for pain.  Do not take more then 4g per day.  If pain is uncontrolled with gabapentin and Tylenol, you may use the oxycodone as prescribed by your primary care provider for breakthrough pain.  Please follow-up with your PCP within the next 2 weeks to follow-up on your MRI results and for further refills if needed of the oxycodone.  Return to the ER if you urinate or have a bowel movement on yourself that is due to loss of control (not just inability to get to the bathroom in time), any unexplained fever or chills, groin numbness, any other new or concerning symptoms.

## 2023-08-12 DIAGNOSIS — C8330 Diffuse large B-cell lymphoma, unspecified site: Secondary | ICD-10-CM | POA: Diagnosis not present

## 2023-08-12 DIAGNOSIS — F39 Unspecified mood [affective] disorder: Secondary | ICD-10-CM | POA: Diagnosis not present

## 2023-08-12 DIAGNOSIS — I82502 Chronic embolism and thrombosis of unspecified deep veins of left lower extremity: Secondary | ICD-10-CM | POA: Diagnosis not present

## 2023-08-12 DIAGNOSIS — C8389 Other non-follicular lymphoma, extranodal and solid organ sites: Secondary | ICD-10-CM | POA: Diagnosis not present

## 2023-08-12 DIAGNOSIS — G9341 Metabolic encephalopathy: Secondary | ICD-10-CM | POA: Diagnosis not present

## 2023-08-12 DIAGNOSIS — G834 Cauda equina syndrome: Secondary | ICD-10-CM | POA: Diagnosis not present

## 2023-08-12 DIAGNOSIS — R4182 Altered mental status, unspecified: Secondary | ICD-10-CM | POA: Diagnosis not present

## 2023-08-12 DIAGNOSIS — N3289 Other specified disorders of bladder: Secondary | ICD-10-CM | POA: Diagnosis not present

## 2023-08-12 DIAGNOSIS — R918 Other nonspecific abnormal finding of lung field: Secondary | ICD-10-CM | POA: Diagnosis not present

## 2023-08-12 DIAGNOSIS — G9589 Other specified diseases of spinal cord: Secondary | ICD-10-CM | POA: Diagnosis not present

## 2023-08-12 DIAGNOSIS — I491 Atrial premature depolarization: Secondary | ICD-10-CM | POA: Diagnosis not present

## 2023-08-12 DIAGNOSIS — C8338 Diffuse large B-cell lymphoma, lymph nodes of multiple sites: Secondary | ICD-10-CM | POA: Diagnosis not present

## 2023-08-12 DIAGNOSIS — E1151 Type 2 diabetes mellitus with diabetic peripheral angiopathy without gangrene: Secondary | ICD-10-CM | POA: Diagnosis not present

## 2023-08-12 DIAGNOSIS — I825Y2 Chronic embolism and thrombosis of unspecified deep veins of left proximal lower extremity: Secondary | ICD-10-CM | POA: Diagnosis not present

## 2023-08-12 DIAGNOSIS — Z8546 Personal history of malignant neoplasm of prostate: Secondary | ICD-10-CM | POA: Diagnosis not present

## 2023-08-12 DIAGNOSIS — C61 Malignant neoplasm of prostate: Secondary | ICD-10-CM | POA: Diagnosis not present

## 2023-08-12 DIAGNOSIS — C8518 Unspecified B-cell lymphoma, lymph nodes of multiple sites: Secondary | ICD-10-CM | POA: Diagnosis not present

## 2023-08-12 DIAGNOSIS — I440 Atrioventricular block, first degree: Secondary | ICD-10-CM | POA: Diagnosis not present

## 2023-08-12 DIAGNOSIS — Z981 Arthrodesis status: Secondary | ICD-10-CM | POA: Diagnosis not present

## 2023-08-12 DIAGNOSIS — N179 Acute kidney failure, unspecified: Secondary | ICD-10-CM | POA: Diagnosis not present

## 2023-08-12 DIAGNOSIS — M7989 Other specified soft tissue disorders: Secondary | ICD-10-CM | POA: Diagnosis not present

## 2023-08-12 DIAGNOSIS — G9389 Other specified disorders of brain: Secondary | ICD-10-CM | POA: Diagnosis not present

## 2023-08-12 DIAGNOSIS — I10 Essential (primary) hypertension: Secondary | ICD-10-CM | POA: Diagnosis not present

## 2023-08-12 DIAGNOSIS — C8589 Other specified types of non-Hodgkin lymphoma, extranodal and solid organ sites: Secondary | ICD-10-CM | POA: Diagnosis not present

## 2023-08-12 DIAGNOSIS — R3915 Urgency of urination: Secondary | ICD-10-CM | POA: Diagnosis not present

## 2023-08-12 DIAGNOSIS — I517 Cardiomegaly: Secondary | ICD-10-CM | POA: Diagnosis not present

## 2023-08-12 DIAGNOSIS — D472 Monoclonal gammopathy: Secondary | ICD-10-CM | POA: Diagnosis not present

## 2023-08-12 DIAGNOSIS — C7951 Secondary malignant neoplasm of bone: Secondary | ICD-10-CM | POA: Diagnosis not present

## 2023-08-12 DIAGNOSIS — E440 Moderate protein-calorie malnutrition: Secondary | ICD-10-CM | POA: Diagnosis not present

## 2023-08-12 DIAGNOSIS — M79605 Pain in left leg: Secondary | ICD-10-CM | POA: Diagnosis not present

## 2023-08-12 DIAGNOSIS — M545 Low back pain, unspecified: Secondary | ICD-10-CM | POA: Diagnosis not present

## 2023-08-13 DIAGNOSIS — I825Y2 Chronic embolism and thrombosis of unspecified deep veins of left proximal lower extremity: Secondary | ICD-10-CM | POA: Diagnosis not present

## 2023-08-13 DIAGNOSIS — C61 Malignant neoplasm of prostate: Secondary | ICD-10-CM | POA: Diagnosis not present

## 2023-08-13 DIAGNOSIS — R918 Other nonspecific abnormal finding of lung field: Secondary | ICD-10-CM | POA: Diagnosis not present

## 2023-08-13 DIAGNOSIS — G9341 Metabolic encephalopathy: Secondary | ICD-10-CM | POA: Diagnosis not present

## 2023-08-13 DIAGNOSIS — R3915 Urgency of urination: Secondary | ICD-10-CM | POA: Diagnosis not present

## 2023-08-13 DIAGNOSIS — N3289 Other specified disorders of bladder: Secondary | ICD-10-CM | POA: Diagnosis not present

## 2023-08-13 DIAGNOSIS — D472 Monoclonal gammopathy: Secondary | ICD-10-CM | POA: Diagnosis not present

## 2023-08-13 DIAGNOSIS — M7989 Other specified soft tissue disorders: Secondary | ICD-10-CM | POA: Diagnosis not present

## 2023-08-13 DIAGNOSIS — Z8546 Personal history of malignant neoplasm of prostate: Secondary | ICD-10-CM | POA: Diagnosis not present

## 2023-08-13 DIAGNOSIS — G9589 Other specified diseases of spinal cord: Secondary | ICD-10-CM | POA: Diagnosis not present

## 2023-08-14 DIAGNOSIS — C7951 Secondary malignant neoplasm of bone: Secondary | ICD-10-CM | POA: Diagnosis not present

## 2023-08-14 DIAGNOSIS — G9589 Other specified diseases of spinal cord: Secondary | ICD-10-CM | POA: Diagnosis not present

## 2023-08-15 DIAGNOSIS — M7989 Other specified soft tissue disorders: Secondary | ICD-10-CM | POA: Diagnosis not present

## 2023-08-15 DIAGNOSIS — G9589 Other specified diseases of spinal cord: Secondary | ICD-10-CM | POA: Diagnosis not present

## 2023-08-16 DIAGNOSIS — G9589 Other specified diseases of spinal cord: Secondary | ICD-10-CM | POA: Diagnosis not present

## 2023-08-17 DIAGNOSIS — Z981 Arthrodesis status: Secondary | ICD-10-CM | POA: Diagnosis not present

## 2023-08-17 DIAGNOSIS — M545 Low back pain, unspecified: Secondary | ICD-10-CM | POA: Diagnosis not present

## 2023-08-17 DIAGNOSIS — G9589 Other specified diseases of spinal cord: Secondary | ICD-10-CM | POA: Diagnosis not present

## 2023-08-18 DIAGNOSIS — M79605 Pain in left leg: Secondary | ICD-10-CM | POA: Diagnosis not present

## 2023-08-18 DIAGNOSIS — G9589 Other specified diseases of spinal cord: Secondary | ICD-10-CM | POA: Diagnosis not present

## 2023-08-19 DIAGNOSIS — G9589 Other specified diseases of spinal cord: Secondary | ICD-10-CM | POA: Diagnosis not present

## 2023-08-20 DIAGNOSIS — G9589 Other specified diseases of spinal cord: Secondary | ICD-10-CM | POA: Diagnosis not present

## 2023-08-21 DIAGNOSIS — G9589 Other specified diseases of spinal cord: Secondary | ICD-10-CM | POA: Diagnosis not present

## 2023-08-22 DIAGNOSIS — M79605 Pain in left leg: Secondary | ICD-10-CM | POA: Diagnosis not present

## 2023-08-23 DIAGNOSIS — C8330 Diffuse large B-cell lymphoma, unspecified site: Secondary | ICD-10-CM | POA: Diagnosis not present

## 2023-08-23 DIAGNOSIS — M79605 Pain in left leg: Secondary | ICD-10-CM | POA: Diagnosis not present

## 2023-08-24 DIAGNOSIS — M79605 Pain in left leg: Secondary | ICD-10-CM | POA: Diagnosis not present

## 2023-08-25 DIAGNOSIS — M79605 Pain in left leg: Secondary | ICD-10-CM | POA: Diagnosis not present

## 2023-08-26 DIAGNOSIS — M79605 Pain in left leg: Secondary | ICD-10-CM | POA: Diagnosis not present

## 2023-08-28 DIAGNOSIS — M47814 Spondylosis without myelopathy or radiculopathy, thoracic region: Secondary | ICD-10-CM | POA: Diagnosis not present

## 2023-08-28 DIAGNOSIS — G9529 Other cord compression: Secondary | ICD-10-CM | POA: Diagnosis not present

## 2023-08-28 DIAGNOSIS — R109 Unspecified abdominal pain: Secondary | ICD-10-CM | POA: Diagnosis not present

## 2023-08-28 DIAGNOSIS — M4805 Spinal stenosis, thoracolumbar region: Secondary | ICD-10-CM | POA: Diagnosis not present

## 2023-08-28 DIAGNOSIS — R310 Gross hematuria: Secondary | ICD-10-CM | POA: Diagnosis not present

## 2023-08-28 DIAGNOSIS — R410 Disorientation, unspecified: Secondary | ICD-10-CM | POA: Diagnosis not present

## 2023-08-28 DIAGNOSIS — Z7409 Other reduced mobility: Secondary | ICD-10-CM | POA: Diagnosis not present

## 2023-08-28 DIAGNOSIS — M79605 Pain in left leg: Secondary | ICD-10-CM | POA: Diagnosis not present

## 2023-08-28 DIAGNOSIS — G9341 Metabolic encephalopathy: Secondary | ICD-10-CM | POA: Diagnosis not present

## 2023-08-28 DIAGNOSIS — M47816 Spondylosis without myelopathy or radiculopathy, lumbar region: Secondary | ICD-10-CM | POA: Diagnosis not present

## 2023-08-28 DIAGNOSIS — C8330 Diffuse large B-cell lymphoma, unspecified site: Secondary | ICD-10-CM | POA: Diagnosis not present

## 2023-08-28 DIAGNOSIS — G9519 Other vascular myelopathies: Secondary | ICD-10-CM | POA: Diagnosis not present

## 2023-08-28 DIAGNOSIS — Z981 Arthrodesis status: Secondary | ICD-10-CM | POA: Diagnosis not present

## 2023-08-28 DIAGNOSIS — M48061 Spinal stenosis, lumbar region without neurogenic claudication: Secondary | ICD-10-CM | POA: Diagnosis not present

## 2023-08-28 DIAGNOSIS — E876 Hypokalemia: Secondary | ICD-10-CM | POA: Diagnosis not present

## 2023-08-28 DIAGNOSIS — M4808 Spinal stenosis, sacral and sacrococcygeal region: Secondary | ICD-10-CM | POA: Diagnosis not present

## 2023-08-28 DIAGNOSIS — Z789 Other specified health status: Secondary | ICD-10-CM | POA: Diagnosis not present

## 2023-09-07 DIAGNOSIS — I10 Essential (primary) hypertension: Secondary | ICD-10-CM | POA: Diagnosis not present

## 2023-09-07 DIAGNOSIS — G9589 Other specified diseases of spinal cord: Secondary | ICD-10-CM | POA: Diagnosis not present

## 2023-09-07 DIAGNOSIS — E119 Type 2 diabetes mellitus without complications: Secondary | ICD-10-CM | POA: Diagnosis not present

## 2023-09-07 DIAGNOSIS — I82509 Chronic embolism and thrombosis of unspecified deep veins of unspecified lower extremity: Secondary | ICD-10-CM | POA: Diagnosis not present

## 2023-09-07 DIAGNOSIS — D472 Monoclonal gammopathy: Secondary | ICD-10-CM | POA: Diagnosis not present

## 2023-09-07 DIAGNOSIS — F39 Unspecified mood [affective] disorder: Secondary | ICD-10-CM | POA: Diagnosis not present

## 2023-09-07 DIAGNOSIS — C8510 Unspecified B-cell lymphoma, unspecified site: Secondary | ICD-10-CM | POA: Diagnosis not present

## 2023-09-07 DIAGNOSIS — C61 Malignant neoplasm of prostate: Secondary | ICD-10-CM | POA: Diagnosis not present

## 2023-09-07 DIAGNOSIS — N400 Enlarged prostate without lower urinary tract symptoms: Secondary | ICD-10-CM | POA: Diagnosis not present

## 2023-09-10 DIAGNOSIS — E119 Type 2 diabetes mellitus without complications: Secondary | ICD-10-CM | POA: Diagnosis not present

## 2023-09-10 DIAGNOSIS — C8510 Unspecified B-cell lymphoma, unspecified site: Secondary | ICD-10-CM | POA: Diagnosis not present

## 2023-09-10 DIAGNOSIS — C61 Malignant neoplasm of prostate: Secondary | ICD-10-CM | POA: Diagnosis not present

## 2023-09-10 DIAGNOSIS — F39 Unspecified mood [affective] disorder: Secondary | ICD-10-CM | POA: Diagnosis not present

## 2023-09-10 DIAGNOSIS — G9589 Other specified diseases of spinal cord: Secondary | ICD-10-CM | POA: Diagnosis not present

## 2023-09-10 DIAGNOSIS — D472 Monoclonal gammopathy: Secondary | ICD-10-CM | POA: Diagnosis not present

## 2023-09-10 DIAGNOSIS — I10 Essential (primary) hypertension: Secondary | ICD-10-CM | POA: Diagnosis not present

## 2023-09-10 DIAGNOSIS — N400 Enlarged prostate without lower urinary tract symptoms: Secondary | ICD-10-CM | POA: Diagnosis not present

## 2023-09-10 DIAGNOSIS — I82509 Chronic embolism and thrombosis of unspecified deep veins of unspecified lower extremity: Secondary | ICD-10-CM | POA: Diagnosis not present

## 2023-09-11 DIAGNOSIS — E119 Type 2 diabetes mellitus without complications: Secondary | ICD-10-CM | POA: Diagnosis not present

## 2023-09-11 DIAGNOSIS — C61 Malignant neoplasm of prostate: Secondary | ICD-10-CM | POA: Diagnosis not present

## 2023-09-11 DIAGNOSIS — C8510 Unspecified B-cell lymphoma, unspecified site: Secondary | ICD-10-CM | POA: Diagnosis not present

## 2023-09-11 DIAGNOSIS — I10 Essential (primary) hypertension: Secondary | ICD-10-CM | POA: Diagnosis not present

## 2023-09-11 DIAGNOSIS — N400 Enlarged prostate without lower urinary tract symptoms: Secondary | ICD-10-CM | POA: Diagnosis not present

## 2023-09-11 DIAGNOSIS — I82509 Chronic embolism and thrombosis of unspecified deep veins of unspecified lower extremity: Secondary | ICD-10-CM | POA: Diagnosis not present

## 2023-09-11 DIAGNOSIS — F39 Unspecified mood [affective] disorder: Secondary | ICD-10-CM | POA: Diagnosis not present

## 2023-09-11 DIAGNOSIS — G9589 Other specified diseases of spinal cord: Secondary | ICD-10-CM | POA: Diagnosis not present

## 2023-09-11 DIAGNOSIS — D472 Monoclonal gammopathy: Secondary | ICD-10-CM | POA: Diagnosis not present

## 2023-09-12 DIAGNOSIS — I82502 Chronic embolism and thrombosis of unspecified deep veins of left lower extremity: Secondary | ICD-10-CM | POA: Diagnosis not present

## 2023-09-12 DIAGNOSIS — R4189 Other symptoms and signs involving cognitive functions and awareness: Secondary | ICD-10-CM | POA: Diagnosis not present

## 2023-09-12 DIAGNOSIS — C8330 Diffuse large B-cell lymphoma, unspecified site: Secondary | ICD-10-CM | POA: Diagnosis not present

## 2023-09-12 DIAGNOSIS — N3289 Other specified disorders of bladder: Secondary | ICD-10-CM | POA: Diagnosis not present

## 2023-09-12 DIAGNOSIS — F39 Unspecified mood [affective] disorder: Secondary | ICD-10-CM | POA: Diagnosis not present

## 2023-09-12 DIAGNOSIS — M5410 Radiculopathy, site unspecified: Secondary | ICD-10-CM | POA: Diagnosis not present

## 2023-09-12 DIAGNOSIS — N400 Enlarged prostate without lower urinary tract symptoms: Secondary | ICD-10-CM | POA: Diagnosis not present

## 2023-09-12 DIAGNOSIS — D472 Monoclonal gammopathy: Secondary | ICD-10-CM | POA: Diagnosis not present

## 2023-09-12 DIAGNOSIS — R410 Disorientation, unspecified: Secondary | ICD-10-CM | POA: Diagnosis not present

## 2023-09-12 DIAGNOSIS — E876 Hypokalemia: Secondary | ICD-10-CM | POA: Diagnosis not present

## 2023-09-13 ENCOUNTER — Encounter (INDEPENDENT_AMBULATORY_CARE_PROVIDER_SITE_OTHER): Payer: Medicare HMO | Admitting: Gastroenterology

## 2023-09-14 DIAGNOSIS — E6609 Other obesity due to excess calories: Secondary | ICD-10-CM | POA: Diagnosis not present

## 2023-09-14 DIAGNOSIS — G9589 Other specified diseases of spinal cord: Secondary | ICD-10-CM | POA: Diagnosis not present

## 2023-09-14 DIAGNOSIS — D472 Monoclonal gammopathy: Secondary | ICD-10-CM | POA: Diagnosis not present

## 2023-09-14 DIAGNOSIS — E119 Type 2 diabetes mellitus without complications: Secondary | ICD-10-CM | POA: Diagnosis not present

## 2023-09-14 DIAGNOSIS — R2689 Other abnormalities of gait and mobility: Secondary | ICD-10-CM | POA: Diagnosis not present

## 2023-09-14 DIAGNOSIS — I82509 Chronic embolism and thrombosis of unspecified deep veins of unspecified lower extremity: Secondary | ICD-10-CM | POA: Diagnosis not present

## 2023-09-14 DIAGNOSIS — C61 Malignant neoplasm of prostate: Secondary | ICD-10-CM | POA: Diagnosis not present

## 2023-09-14 DIAGNOSIS — F39 Unspecified mood [affective] disorder: Secondary | ICD-10-CM | POA: Diagnosis not present

## 2023-09-14 DIAGNOSIS — R7309 Other abnormal glucose: Secondary | ICD-10-CM | POA: Diagnosis not present

## 2023-09-14 DIAGNOSIS — C8510 Unspecified B-cell lymphoma, unspecified site: Secondary | ICD-10-CM | POA: Diagnosis not present

## 2023-09-14 DIAGNOSIS — E20811 Secondary hypoparathyroidism in diseases classified elsewhere: Secondary | ICD-10-CM | POA: Diagnosis not present

## 2023-09-14 DIAGNOSIS — N401 Enlarged prostate with lower urinary tract symptoms: Secondary | ICD-10-CM | POA: Diagnosis not present

## 2023-09-14 DIAGNOSIS — Z8546 Personal history of malignant neoplasm of prostate: Secondary | ICD-10-CM | POA: Diagnosis not present

## 2023-09-14 DIAGNOSIS — N400 Enlarged prostate without lower urinary tract symptoms: Secondary | ICD-10-CM | POA: Diagnosis not present

## 2023-09-14 DIAGNOSIS — Z6832 Body mass index (BMI) 32.0-32.9, adult: Secondary | ICD-10-CM | POA: Diagnosis not present

## 2023-09-14 DIAGNOSIS — I10 Essential (primary) hypertension: Secondary | ICD-10-CM | POA: Diagnosis not present

## 2023-09-17 DIAGNOSIS — F39 Unspecified mood [affective] disorder: Secondary | ICD-10-CM | POA: Diagnosis not present

## 2023-09-17 DIAGNOSIS — C61 Malignant neoplasm of prostate: Secondary | ICD-10-CM | POA: Diagnosis not present

## 2023-09-17 DIAGNOSIS — E119 Type 2 diabetes mellitus without complications: Secondary | ICD-10-CM | POA: Diagnosis not present

## 2023-09-17 DIAGNOSIS — C8510 Unspecified B-cell lymphoma, unspecified site: Secondary | ICD-10-CM | POA: Diagnosis not present

## 2023-09-17 DIAGNOSIS — I10 Essential (primary) hypertension: Secondary | ICD-10-CM | POA: Diagnosis not present

## 2023-09-17 DIAGNOSIS — I82509 Chronic embolism and thrombosis of unspecified deep veins of unspecified lower extremity: Secondary | ICD-10-CM | POA: Diagnosis not present

## 2023-09-17 DIAGNOSIS — N400 Enlarged prostate without lower urinary tract symptoms: Secondary | ICD-10-CM | POA: Diagnosis not present

## 2023-09-17 DIAGNOSIS — D472 Monoclonal gammopathy: Secondary | ICD-10-CM | POA: Diagnosis not present

## 2023-09-17 DIAGNOSIS — G9589 Other specified diseases of spinal cord: Secondary | ICD-10-CM | POA: Diagnosis not present

## 2023-09-18 DIAGNOSIS — I10 Essential (primary) hypertension: Secondary | ICD-10-CM | POA: Diagnosis not present

## 2023-09-18 DIAGNOSIS — I82509 Chronic embolism and thrombosis of unspecified deep veins of unspecified lower extremity: Secondary | ICD-10-CM | POA: Diagnosis not present

## 2023-09-18 DIAGNOSIS — N400 Enlarged prostate without lower urinary tract symptoms: Secondary | ICD-10-CM | POA: Diagnosis not present

## 2023-09-18 DIAGNOSIS — D472 Monoclonal gammopathy: Secondary | ICD-10-CM | POA: Diagnosis not present

## 2023-09-18 DIAGNOSIS — C61 Malignant neoplasm of prostate: Secondary | ICD-10-CM | POA: Diagnosis not present

## 2023-09-18 DIAGNOSIS — G9589 Other specified diseases of spinal cord: Secondary | ICD-10-CM | POA: Diagnosis not present

## 2023-09-18 DIAGNOSIS — E119 Type 2 diabetes mellitus without complications: Secondary | ICD-10-CM | POA: Diagnosis not present

## 2023-09-18 DIAGNOSIS — C8510 Unspecified B-cell lymphoma, unspecified site: Secondary | ICD-10-CM | POA: Diagnosis not present

## 2023-09-18 DIAGNOSIS — F39 Unspecified mood [affective] disorder: Secondary | ICD-10-CM | POA: Diagnosis not present

## 2023-09-20 DIAGNOSIS — E119 Type 2 diabetes mellitus without complications: Secondary | ICD-10-CM | POA: Diagnosis not present

## 2023-09-20 DIAGNOSIS — N400 Enlarged prostate without lower urinary tract symptoms: Secondary | ICD-10-CM | POA: Diagnosis not present

## 2023-09-20 DIAGNOSIS — G9589 Other specified diseases of spinal cord: Secondary | ICD-10-CM | POA: Diagnosis not present

## 2023-09-20 DIAGNOSIS — I10 Essential (primary) hypertension: Secondary | ICD-10-CM | POA: Diagnosis not present

## 2023-09-20 DIAGNOSIS — D472 Monoclonal gammopathy: Secondary | ICD-10-CM | POA: Diagnosis not present

## 2023-09-20 DIAGNOSIS — F39 Unspecified mood [affective] disorder: Secondary | ICD-10-CM | POA: Diagnosis not present

## 2023-09-20 DIAGNOSIS — I82509 Chronic embolism and thrombosis of unspecified deep veins of unspecified lower extremity: Secondary | ICD-10-CM | POA: Diagnosis not present

## 2023-09-20 DIAGNOSIS — C8510 Unspecified B-cell lymphoma, unspecified site: Secondary | ICD-10-CM | POA: Diagnosis not present

## 2023-09-20 DIAGNOSIS — C61 Malignant neoplasm of prostate: Secondary | ICD-10-CM | POA: Diagnosis not present

## 2023-09-24 DIAGNOSIS — E119 Type 2 diabetes mellitus without complications: Secondary | ICD-10-CM | POA: Diagnosis not present

## 2023-09-24 DIAGNOSIS — I82509 Chronic embolism and thrombosis of unspecified deep veins of unspecified lower extremity: Secondary | ICD-10-CM | POA: Diagnosis not present

## 2023-09-24 DIAGNOSIS — C61 Malignant neoplasm of prostate: Secondary | ICD-10-CM | POA: Diagnosis not present

## 2023-09-24 DIAGNOSIS — N400 Enlarged prostate without lower urinary tract symptoms: Secondary | ICD-10-CM | POA: Diagnosis not present

## 2023-09-24 DIAGNOSIS — I10 Essential (primary) hypertension: Secondary | ICD-10-CM | POA: Diagnosis not present

## 2023-09-24 DIAGNOSIS — G9589 Other specified diseases of spinal cord: Secondary | ICD-10-CM | POA: Diagnosis not present

## 2023-09-24 DIAGNOSIS — F39 Unspecified mood [affective] disorder: Secondary | ICD-10-CM | POA: Diagnosis not present

## 2023-09-24 DIAGNOSIS — D472 Monoclonal gammopathy: Secondary | ICD-10-CM | POA: Diagnosis not present

## 2023-09-24 DIAGNOSIS — C8510 Unspecified B-cell lymphoma, unspecified site: Secondary | ICD-10-CM | POA: Diagnosis not present

## 2023-09-25 DIAGNOSIS — C8510 Unspecified B-cell lymphoma, unspecified site: Secondary | ICD-10-CM | POA: Diagnosis not present

## 2023-09-25 DIAGNOSIS — I82509 Chronic embolism and thrombosis of unspecified deep veins of unspecified lower extremity: Secondary | ICD-10-CM | POA: Diagnosis not present

## 2023-09-25 DIAGNOSIS — F39 Unspecified mood [affective] disorder: Secondary | ICD-10-CM | POA: Diagnosis not present

## 2023-09-25 DIAGNOSIS — N400 Enlarged prostate without lower urinary tract symptoms: Secondary | ICD-10-CM | POA: Diagnosis not present

## 2023-09-25 DIAGNOSIS — E119 Type 2 diabetes mellitus without complications: Secondary | ICD-10-CM | POA: Diagnosis not present

## 2023-09-25 DIAGNOSIS — C61 Malignant neoplasm of prostate: Secondary | ICD-10-CM | POA: Diagnosis not present

## 2023-09-25 DIAGNOSIS — G9589 Other specified diseases of spinal cord: Secondary | ICD-10-CM | POA: Diagnosis not present

## 2023-09-25 DIAGNOSIS — D472 Monoclonal gammopathy: Secondary | ICD-10-CM | POA: Diagnosis not present

## 2023-09-25 DIAGNOSIS — I10 Essential (primary) hypertension: Secondary | ICD-10-CM | POA: Diagnosis not present

## 2023-09-26 DIAGNOSIS — N179 Acute kidney failure, unspecified: Secondary | ICD-10-CM | POA: Diagnosis not present

## 2023-09-26 DIAGNOSIS — C8330 Diffuse large B-cell lymphoma, unspecified site: Secondary | ICD-10-CM | POA: Diagnosis not present

## 2023-09-26 DIAGNOSIS — R296 Repeated falls: Secondary | ICD-10-CM | POA: Diagnosis not present

## 2023-09-26 DIAGNOSIS — I10 Essential (primary) hypertension: Secondary | ICD-10-CM | POA: Diagnosis not present

## 2023-09-26 DIAGNOSIS — B370 Candidal stomatitis: Secondary | ICD-10-CM | POA: Diagnosis not present

## 2023-09-26 DIAGNOSIS — C61 Malignant neoplasm of prostate: Secondary | ICD-10-CM | POA: Diagnosis not present

## 2023-09-26 DIAGNOSIS — N178 Other acute kidney failure: Secondary | ICD-10-CM | POA: Diagnosis not present

## 2023-09-26 DIAGNOSIS — F17210 Nicotine dependence, cigarettes, uncomplicated: Secondary | ICD-10-CM | POA: Diagnosis not present

## 2023-09-26 DIAGNOSIS — F39 Unspecified mood [affective] disorder: Secondary | ICD-10-CM | POA: Diagnosis not present

## 2023-09-26 DIAGNOSIS — Z5111 Encounter for antineoplastic chemotherapy: Secondary | ICD-10-CM | POA: Diagnosis not present

## 2023-09-26 DIAGNOSIS — T451X5A Adverse effect of antineoplastic and immunosuppressive drugs, initial encounter: Secondary | ICD-10-CM | POA: Diagnosis not present

## 2023-09-26 DIAGNOSIS — E119 Type 2 diabetes mellitus without complications: Secondary | ICD-10-CM | POA: Diagnosis not present

## 2023-09-26 DIAGNOSIS — C8510 Unspecified B-cell lymphoma, unspecified site: Secondary | ICD-10-CM | POA: Diagnosis not present

## 2023-09-26 DIAGNOSIS — G9589 Other specified diseases of spinal cord: Secondary | ICD-10-CM | POA: Diagnosis not present

## 2023-09-26 DIAGNOSIS — E8809 Other disorders of plasma-protein metabolism, not elsewhere classified: Secondary | ICD-10-CM | POA: Diagnosis not present

## 2023-09-27 ENCOUNTER — Encounter (INDEPENDENT_AMBULATORY_CARE_PROVIDER_SITE_OTHER): Payer: Medicare HMO | Admitting: Gastroenterology

## 2023-09-28 DIAGNOSIS — C61 Malignant neoplasm of prostate: Secondary | ICD-10-CM | POA: Diagnosis not present

## 2023-09-28 DIAGNOSIS — G9589 Other specified diseases of spinal cord: Secondary | ICD-10-CM | POA: Diagnosis not present

## 2023-09-28 DIAGNOSIS — C8510 Unspecified B-cell lymphoma, unspecified site: Secondary | ICD-10-CM | POA: Diagnosis not present

## 2023-09-28 DIAGNOSIS — F39 Unspecified mood [affective] disorder: Secondary | ICD-10-CM | POA: Diagnosis not present

## 2023-10-02 ENCOUNTER — Other Ambulatory Visit: Payer: Medicare HMO

## 2023-10-04 DIAGNOSIS — D472 Monoclonal gammopathy: Secondary | ICD-10-CM | POA: Diagnosis not present

## 2023-10-04 DIAGNOSIS — I10 Essential (primary) hypertension: Secondary | ICD-10-CM | POA: Diagnosis not present

## 2023-10-04 DIAGNOSIS — I82509 Chronic embolism and thrombosis of unspecified deep veins of unspecified lower extremity: Secondary | ICD-10-CM | POA: Diagnosis not present

## 2023-10-04 DIAGNOSIS — C61 Malignant neoplasm of prostate: Secondary | ICD-10-CM | POA: Diagnosis not present

## 2023-10-04 DIAGNOSIS — G9589 Other specified diseases of spinal cord: Secondary | ICD-10-CM | POA: Diagnosis not present

## 2023-10-04 DIAGNOSIS — E119 Type 2 diabetes mellitus without complications: Secondary | ICD-10-CM | POA: Diagnosis not present

## 2023-10-04 DIAGNOSIS — F39 Unspecified mood [affective] disorder: Secondary | ICD-10-CM | POA: Diagnosis not present

## 2023-10-04 DIAGNOSIS — C8510 Unspecified B-cell lymphoma, unspecified site: Secondary | ICD-10-CM | POA: Diagnosis not present

## 2023-10-04 DIAGNOSIS — N400 Enlarged prostate without lower urinary tract symptoms: Secondary | ICD-10-CM | POA: Diagnosis not present

## 2023-10-08 DIAGNOSIS — G9589 Other specified diseases of spinal cord: Secondary | ICD-10-CM | POA: Diagnosis not present

## 2023-10-08 DIAGNOSIS — I10 Essential (primary) hypertension: Secondary | ICD-10-CM | POA: Diagnosis not present

## 2023-10-08 DIAGNOSIS — N400 Enlarged prostate without lower urinary tract symptoms: Secondary | ICD-10-CM | POA: Diagnosis not present

## 2023-10-08 DIAGNOSIS — E119 Type 2 diabetes mellitus without complications: Secondary | ICD-10-CM | POA: Diagnosis not present

## 2023-10-08 DIAGNOSIS — I82509 Chronic embolism and thrombosis of unspecified deep veins of unspecified lower extremity: Secondary | ICD-10-CM | POA: Diagnosis not present

## 2023-10-08 DIAGNOSIS — C8510 Unspecified B-cell lymphoma, unspecified site: Secondary | ICD-10-CM | POA: Diagnosis not present

## 2023-10-08 DIAGNOSIS — C61 Malignant neoplasm of prostate: Secondary | ICD-10-CM | POA: Diagnosis not present

## 2023-10-08 DIAGNOSIS — D472 Monoclonal gammopathy: Secondary | ICD-10-CM | POA: Diagnosis not present

## 2023-10-08 DIAGNOSIS — F39 Unspecified mood [affective] disorder: Secondary | ICD-10-CM | POA: Diagnosis not present

## 2023-10-09 DIAGNOSIS — M5416 Radiculopathy, lumbar region: Secondary | ICD-10-CM | POA: Diagnosis not present

## 2023-10-09 DIAGNOSIS — G9589 Other specified diseases of spinal cord: Secondary | ICD-10-CM | POA: Diagnosis not present

## 2023-10-10 ENCOUNTER — Ambulatory Visit: Payer: Medicare HMO | Admitting: Urology

## 2023-10-10 DIAGNOSIS — N179 Acute kidney failure, unspecified: Secondary | ICD-10-CM | POA: Diagnosis not present

## 2023-10-10 DIAGNOSIS — D472 Monoclonal gammopathy: Secondary | ICD-10-CM | POA: Diagnosis not present

## 2023-10-10 DIAGNOSIS — I10 Essential (primary) hypertension: Secondary | ICD-10-CM | POA: Diagnosis not present

## 2023-10-10 DIAGNOSIS — G9589 Other specified diseases of spinal cord: Secondary | ICD-10-CM | POA: Diagnosis not present

## 2023-10-10 DIAGNOSIS — E119 Type 2 diabetes mellitus without complications: Secondary | ICD-10-CM | POA: Diagnosis not present

## 2023-10-10 DIAGNOSIS — C8330 Diffuse large B-cell lymphoma, unspecified site: Secondary | ICD-10-CM | POA: Diagnosis not present

## 2023-10-10 DIAGNOSIS — N400 Enlarged prostate without lower urinary tract symptoms: Secondary | ICD-10-CM | POA: Diagnosis not present

## 2023-10-10 DIAGNOSIS — I82509 Chronic embolism and thrombosis of unspecified deep veins of unspecified lower extremity: Secondary | ICD-10-CM | POA: Diagnosis not present

## 2023-10-10 DIAGNOSIS — C61 Malignant neoplasm of prostate: Secondary | ICD-10-CM | POA: Diagnosis not present

## 2023-10-10 DIAGNOSIS — F39 Unspecified mood [affective] disorder: Secondary | ICD-10-CM | POA: Diagnosis not present

## 2023-10-10 DIAGNOSIS — C8510 Unspecified B-cell lymphoma, unspecified site: Secondary | ICD-10-CM | POA: Diagnosis not present

## 2023-10-16 DIAGNOSIS — C83398 Diffuse large b-cell lymphoma of other extranodal and solid organ sites: Secondary | ICD-10-CM | POA: Diagnosis not present

## 2023-10-16 DIAGNOSIS — Z5111 Encounter for antineoplastic chemotherapy: Secondary | ICD-10-CM | POA: Diagnosis not present

## 2023-10-16 DIAGNOSIS — Z5112 Encounter for antineoplastic immunotherapy: Secondary | ICD-10-CM | POA: Diagnosis not present

## 2023-10-16 DIAGNOSIS — C8330 Diffuse large B-cell lymphoma, unspecified site: Secondary | ICD-10-CM | POA: Diagnosis not present

## 2023-10-16 DIAGNOSIS — D472 Monoclonal gammopathy: Secondary | ICD-10-CM | POA: Diagnosis not present

## 2023-10-31 DIAGNOSIS — M7989 Other specified soft tissue disorders: Secondary | ICD-10-CM | POA: Diagnosis not present

## 2023-10-31 DIAGNOSIS — N281 Cyst of kidney, acquired: Secondary | ICD-10-CM | POA: Diagnosis not present

## 2023-10-31 DIAGNOSIS — C8338 Diffuse large B-cell lymphoma, lymph nodes of multiple sites: Secondary | ICD-10-CM | POA: Diagnosis not present

## 2023-10-31 DIAGNOSIS — Z79899 Other long term (current) drug therapy: Secondary | ICD-10-CM | POA: Diagnosis not present

## 2023-10-31 DIAGNOSIS — C8330 Diffuse large B-cell lymphoma, unspecified site: Secondary | ICD-10-CM | POA: Diagnosis not present

## 2023-11-01 ENCOUNTER — Encounter (INDEPENDENT_AMBULATORY_CARE_PROVIDER_SITE_OTHER): Payer: Self-pay | Admitting: Gastroenterology

## 2023-11-01 ENCOUNTER — Ambulatory Visit (INDEPENDENT_AMBULATORY_CARE_PROVIDER_SITE_OTHER): Payer: Medicare HMO | Admitting: Gastroenterology

## 2023-11-01 VITALS — BP 152/92 | HR 69 | Temp 97.6°F | Ht 73.0 in | Wt 250.3 lb

## 2023-11-01 DIAGNOSIS — Z8719 Personal history of other diseases of the digestive system: Secondary | ICD-10-CM | POA: Diagnosis not present

## 2023-11-01 DIAGNOSIS — K625 Hemorrhage of anus and rectum: Secondary | ICD-10-CM | POA: Diagnosis not present

## 2023-11-01 DIAGNOSIS — K5900 Constipation, unspecified: Secondary | ICD-10-CM | POA: Diagnosis not present

## 2023-11-01 DIAGNOSIS — D649 Anemia, unspecified: Secondary | ICD-10-CM

## 2023-11-01 DIAGNOSIS — K5903 Drug induced constipation: Secondary | ICD-10-CM

## 2023-11-01 NOTE — Progress Notes (Signed)
Referring Provider: Assunta Found, MD Primary Care Physician:  Assunta Found, MD Primary GI Physician: Dr. Gildardo Pounds  Chief Complaint  Patient presents with   Constipation    Patient arrives with daughter. Follow up on constipation. Takes miralax every other day. Has some rectal bleeding he states if he gets constipated.    HPI:   Gregory Pollack Sivils Sr. is a 72 y.o. male with past medical history of anxiety, arthritis, enlarged prostate, HTN, non hodgkins lymphoma, prostate cancer   Patient presenting today for constipation and rectal bleeding   Last labs on 11/20 with hgb 8.9   Patient states that he started having some constipation that he thinks was due to the medication he is taking. Some of his medication has been stopped therefore constipation seems somewhat improved. He is taking miralax every other day, notes that he is still having to strain some. He has tried miralax daily but this gave him diarrhea.  He has seen intermittent rectal bleeding, sometimes mixed in with stools and sometimes just on toilet paper. Notes that he was straining initially when constipation started though not as much now. Denies any rectal pain, itching, burning. He tries to drink plenty of water though not sure if it is enough, gets close to 1/2 gallon per day on most days. He has no abdominal pain, melena, nausea, vomiting or early satiety. No weight loss. Does as many fruits and veggies in his diet as he can. He notes that he has felt some of his hemorrhoids protruding out, he uses preparation H with some improvement. He notes he may have 1-2 BMs per day or can go up to 2 days without a BM.   He is currently on chemotherapy for his Lymphoma. Has been told he has anemia secondary to his chemotherapy. Has not required any blood transfusions. Notes some occasional dizziness if he stands up too quick.   Last Colonoscopy:05/2022- Hemorrhoids found on perianal exam. - Nine 3 to 8 mm polyps in the transverse colon,  in the ascending colon and in the cecum, removed with  a cold snare. Resected and retrieved. - One 2 mm polyp in the ascending colon, removed   with a cold biopsy forceps. Resected and retrieved. - Two 3 to 5 mm polyps in the descending colon,  removed with a cold snare. Resected and retrieved. - Diverticulosis in the sigmoid colon and in the descending colon.  - Non-bleeding external and internal hemorrhoids. 12- TAs  Repeat Colonoscopy in 3 years   Past Medical History:  Diagnosis Date   Abnormal SPEP 09/09/2015   Anxiety    Arthritis    DDD, low back   Early cataracts, bilateral    Enlarged prostate    Heart murmur    was told at age 22, but has not had any problems   Hypertension    MGUS (monoclonal gammopathy of unknown significance) 01/29/2016   Prostate cancer [facility])    Renal disorder     Past Surgical History:  Procedure Laterality Date   BACK SURGERY  2004, 2015   BIOPSY  06/05/2022   Procedure: BIOPSY;  Surgeon: Dolores Frame, MD;  Location: [facility];  Service: Gastroenterology;;   COLONOSCOPY     COLONOSCOPY WITH PROPOFOL N/A 06/05/2022   Procedure: COLONOSCOPY WITH PROPOFOL;  Surgeon: Dolores Frame, MD;  Location: [facility];  Service: Gastroenterology;  Laterality: N/A;  115 ASA 2   CYSTOSCOPY WITH INJECTION N/A 03/13/2022   Procedure: CYSTOSCOPY WITH INJECTION- BOTOX  100 units;  Surgeon: Malen Gauze, MD;  Location: [facility] ORS;  Service: Urology;  Laterality: N/A;   EYE SURGERY     L eye- as a child    FRACTURE SURGERY Right    thumb   HEMORROIDECTOMY     HERNIA REPAIR     umbilical hernia repair   MASS EXCISION Right 07/11/2016   Procedure: EXCISION 6CM RIGHT SHOULDER MASS;  Surgeon: Ancil Linsey, MD;  Location: [facility] OR[facility];  Service: Vascular;  Laterality: Right;   POLYPECTOMY  06/05/2022   Procedure: POLYPECTOMY;  Surgeon: Dolores Frame, MD;  Location: [facility];  Service: Gastroenterology;;   PROSTATE BIOPSY      TONSILLECTOMY     WOUND DEBRIDEMENT N/A 10/25/2018   Procedure: DEBRIDEMENT OF MUSCLE AND FASCIA AT UMBILICUS;  Surgeon: Lucretia Roers, MD;  Location: [facility] ORS;  Service: General;  Laterality: N/A;    Current Outpatient Medications  Medication Sig Dispense Refill   acetaminophen (TYLENOL) 650 MG CR tablet Take 650-1,300 mg by mouth every 8 (eight) hours as needed for pain.     alfuzosin (UROXATRAL) 10 MG 24 hr tablet Take 1 tablet (10 mg total) by mouth in the morning and at bedtime. 180 tablet 3   apixaban (ELIQUIS) 5 MG TABS tablet Take 5 mg by mouth 2 (two) times daily.     Cholecalciferol (VITAMIN D3 PO) Take 1 tablet by mouth daily.     diclofenac Sodium (VOLTAREN) 1 % GEL Apply 4 g topically 4 (four) times daily. 100 g 0   escitalopram (LEXAPRO) 20 MG tablet Take 20 mg by mouth daily.     fesoterodine (TOVIAZ) 8 MG TB24 tablet Take 1 tablet (8 mg total) by mouth daily. 30 tablet 6   finasteride (PROSCAR) 5 MG tablet Take 1 tablet (5 mg total) by mouth daily. 90 tablet 3   gabapentin (NEURONTIN) 300 MG capsule Take 300 mg by mouth. Tid as needed     lidocaine (LIDODERM) 5 % Place 1 patch onto the skin daily. Remove & Discard patch within 12 hours or as directed by MD 30 patch 0   lisinopril (ZESTRIL) 20 MG tablet Take 20 mg by mouth daily.     polyethylene glycol powder (GLYCOLAX/MIRALAX) 17 GM/SCOOP powder Take 17 g by mouth daily. 255 g 0   predniSONE (DELTASONE) 50 MG tablet Take 50 mg by mouth daily with breakfast. Take 2 tablets on days 1 -5 days of chemo.     PRESCRIPTION MEDICATION Magic mouthwash as needed     PRESCRIPTION MEDICATION Chemo at [facility].     sulfamethoxazole-trimethoprim (BACTRIM DS) 800-160 MG tablet Take 1 tablet by mouth 2 (two) times daily as needed (lipoma).     No current facility-administered medications for this visit.   Facility-Administered Medications Ordered in Other Visits  Medication Dose Route Frequency Provider Last Rate Last Admin    Chlorhexidine Gluconate Cloth 2 % PADS 6 each  6 each Topical Once Ancil Linsey, MD       And   Chlorhexidine Gluconate Cloth 2 % PADS 6 each  6 each Topical Once Ancil Linsey, MD        Allergies as of 11/01/2023 - Review Complete 11/01/2023  Allergen Reaction Noted   Penicillins Other (See Comments) and Swelling 03/31/2012    Family History  Problem Relation Age of Onset   Gallstones Father    Ulcers Father    Alcohol abuse Father    Diabetes Daughter  Diabetes Brother    Diabetes Sister    Breast cancer Sister     Social History   Socioeconomic History   Marital status: Married    Spouse name: Not on file   Number of children: Not on file   Years of education: Not on file   Highest education level: Not on file  Occupational History   Occupation: retire    Comment: 2014; truck driver  Tobacco Use   Smoking status: Former    Current packs/day: 0.50    Average packs/day: 0.5 packs/day for 50.0 years (25.0 ttl pk-yrs)    Types: Cigarettes    Passive exposure: Past   Smokeless tobacco: Never  Vaping Use   Vaping status: Never Used  Substance and Sexual Activity   Alcohol use: No   Drug use: No   Sexual activity: Yes    Birth control/protection: None  Other Topics Concern   Not on file  Social History Narrative   Not on file   Social Drivers of Health   Financial Resource Strain: Not on file  Food Insecurity: Low Risk  (09/26/2023)   Received from [facility]   Hunger Vital Sign    Worried About Running Out of Food in the Last Year: Never true    Ran Out of Food in the Last Year: Never true  Transportation Needs: No Transportation Needs (09/26/2023)   Received from [facility]    In the past 12 months, has lack of reliable transportation kept you from medical appointments, meetings, work or from getting things needed for daily living? : No  Physical Activity: Not on file  Stress: Not on file  Social Connections: Not on  file   Review of systems General: negative for malaise, night sweats, fever, chills, weight loss Neck: Negative for lumps, goiter, pain and significant neck swelling Resp: Negative for cough, wheezing, dyspnea at rest CV: Negative for chest pain, leg swelling, palpitations, orthopnea GI: denies melena, nausea, vomiting, diarrhea, dysphagia, odyonophagia, early satiety or unintentional weight loss. +constipation +rectal bleeding  MSK: Negative for joint pain or swelling, back pain, and muscle pain. Derm: Negative for itching or rash Psych: Denies depression, anxiety, memory loss, confusion. No homicidal or suicidal ideation.  Heme: Negative for prolonged bleeding, bruising easily, and swollen nodes. Endocrine: Negative for cold or heat intolerance, polyuria, polydipsia and goiter. Neuro: negative for tremor, gait imbalance, syncope and seizures. The remainder of the review of systems is noncontributory.  Physical Exam: BP (!) 152/92   Pulse 69   Temp 97.6 F (36.4 C)   Ht 6\' 1"  (1.854 m)   Wt 250 lb 4.8 oz (113.5 kg)   BMI 33.02 kg/m  General:   Alert and oriented. No distress noted. Pleasant and cooperative.  Head:  Normocephalic and atraumatic. Eyes:  Conjuctiva clear without scleral icterus. Mouth:  Oral mucosa pink and moist. Good dentition. No lesions. Heart: Normal rate and rhythm, s1 and s2 heart sounds present.  Lungs: Clear lung sounds in all lobes. Respirations equal and unlabored. Abdomen:  +BS, soft, non-tender and non-distended. No rebound or guarding. No HSM or masses noted. Rectal: wendy richards present as witness, no obvious lesions, masses or external hemorrhoids, sphincter tone WNL.  Derm: No palmar erythema or jaundice Msk:  Symmetrical without gross deformities. Normal posture. Extremities:  Without edema. Neurologic:  Alert and  oriented x4 Psych:  Alert and cooperative. Normal mood and affect.  Invalid input(s): "6 MONTHS"   ASSESSMENT: Kenzi A Nawrot  Sr. is a 72 y.o. male presenting today for constipation and rectal bleeding.  Rectal bleeding onset after patient began after he became constipated, thought due in part to some medications he was on his constipation has improved with those medications being stopped.  He is only seeing blood when he has a harder stool or has to strain a lot.  Recent colonoscopy in 2023 with noted hemorrhoids.   Notably does have some anemia with intermittent pancytopenia, that began after initiation of chemotherapy for lymphoma currently. He denies any abdominal pain, weight loss, changes in appetite, no melena or early satiety.  As above he has had recent colonoscopy, for now we will hold off on upper endoscopy as I suspect the anemia is secondary to his chemotherapy however if he begins to develop any upper GI symptoms will need to discuss pursuing upper endoscopy to rule out any upper GI concerns contributing to his anemia.   PLAN:  Continue MiraLAX daily Add stool softener once daily Continue good water intake Avoid straining and limit long periods of time in the toilet Continue with diet high in fruits, veggies, whole grains Patient to make me aware of worsening symptoms.  All questions were answered, patient verbalized understanding and is in agreement with plan as outlined above.   Follow Up: 3 months   Sadrac Zeoli L. Jeanmarie Hubert, MSN, APRN, AGNP-C Adult-Gerontology Nurse Practitioner [facility]

## 2023-11-01 NOTE — Patient Instructions (Addendum)
Continue with miralax daily and you can try adding in a stool softener once daily such as colace to help soften the stools I suspect that rectal bleeding Is secondary to hemorrhoids that were noted on your recent colonoscopy Continue with plenty of water Avoid straining and limit sitting on the toilet for long periods of time If you have any worsening of symptoms, please let me know   Follow up 3 months  It was a pleasure to see you today. I want to create trusting relationships with patients and provide genuine, compassionate, and quality care. I truly value your feedback! please be on the lookout for a survey regarding your visit with me today. I appreciate your input about our visit and your time in completing this!    Ethelreda Sukhu L. Jeanmarie Hubert, MSN, APRN, AGNP-C Adult-Gerontology Nurse Practitioner [facility] at [facility]

## 2023-11-02 DIAGNOSIS — C61 Malignant neoplasm of prostate: Secondary | ICD-10-CM | POA: Diagnosis not present

## 2023-11-02 DIAGNOSIS — I82509 Chronic embolism and thrombosis of unspecified deep veins of unspecified lower extremity: Secondary | ICD-10-CM | POA: Diagnosis not present

## 2023-11-02 DIAGNOSIS — F39 Unspecified mood [affective] disorder: Secondary | ICD-10-CM | POA: Diagnosis not present

## 2023-11-02 DIAGNOSIS — C8510 Unspecified B-cell lymphoma, unspecified site: Secondary | ICD-10-CM | POA: Diagnosis not present

## 2023-11-02 DIAGNOSIS — D472 Monoclonal gammopathy: Secondary | ICD-10-CM | POA: Diagnosis not present

## 2023-11-02 DIAGNOSIS — G9589 Other specified diseases of spinal cord: Secondary | ICD-10-CM | POA: Diagnosis not present

## 2023-11-02 DIAGNOSIS — E119 Type 2 diabetes mellitus without complications: Secondary | ICD-10-CM | POA: Diagnosis not present

## 2023-11-02 DIAGNOSIS — I10 Essential (primary) hypertension: Secondary | ICD-10-CM | POA: Diagnosis not present

## 2023-11-02 DIAGNOSIS — N400 Enlarged prostate without lower urinary tract symptoms: Secondary | ICD-10-CM | POA: Diagnosis not present

## 2023-11-05 DIAGNOSIS — I82532 Chronic embolism and thrombosis of left popliteal vein: Secondary | ICD-10-CM | POA: Diagnosis not present

## 2023-11-05 DIAGNOSIS — Z5112 Encounter for antineoplastic immunotherapy: Secondary | ICD-10-CM | POA: Diagnosis not present

## 2023-11-05 DIAGNOSIS — C8330 Diffuse large B-cell lymphoma, unspecified site: Secondary | ICD-10-CM | POA: Diagnosis not present

## 2023-11-05 DIAGNOSIS — C83398 Diffuse large b-cell lymphoma of other extranodal and solid organ sites: Secondary | ICD-10-CM | POA: Diagnosis not present

## 2023-11-05 DIAGNOSIS — Z7901 Long term (current) use of anticoagulants: Secondary | ICD-10-CM | POA: Diagnosis not present

## 2023-11-05 DIAGNOSIS — I825Y2 Chronic embolism and thrombosis of unspecified deep veins of left proximal lower extremity: Secondary | ICD-10-CM | POA: Diagnosis not present

## 2023-11-05 DIAGNOSIS — D472 Monoclonal gammopathy: Secondary | ICD-10-CM | POA: Diagnosis not present

## 2023-11-05 DIAGNOSIS — Z5111 Encounter for antineoplastic chemotherapy: Secondary | ICD-10-CM | POA: Diagnosis not present

## 2023-11-05 DIAGNOSIS — I82512 Chronic embolism and thrombosis of left femoral vein: Secondary | ICD-10-CM | POA: Diagnosis not present

## 2023-11-12 DIAGNOSIS — D649 Anemia, unspecified: Secondary | ICD-10-CM | POA: Insufficient documentation

## 2023-11-12 DIAGNOSIS — K5903 Drug induced constipation: Secondary | ICD-10-CM | POA: Insufficient documentation

## 2023-11-13 ENCOUNTER — Other Ambulatory Visit: Payer: Medicare HMO

## 2023-11-13 DIAGNOSIS — C61 Malignant neoplasm of prostate: Secondary | ICD-10-CM | POA: Diagnosis not present

## 2023-11-14 LAB — PSA: Prostate Specific Ag, Serum: <0.1 ng/mL (ref 0.0–4.0)

## 2023-11-16 ENCOUNTER — Ambulatory Visit: Payer: Medicare HMO | Admitting: Urology

## 2023-11-16 ENCOUNTER — Encounter: Payer: Self-pay | Admitting: Urology

## 2023-11-16 VITALS — BP 128/89 | HR 87

## 2023-11-16 DIAGNOSIS — R351 Nocturia: Secondary | ICD-10-CM | POA: Diagnosis not present

## 2023-11-16 DIAGNOSIS — C61 Malignant neoplasm of prostate: Secondary | ICD-10-CM | POA: Diagnosis not present

## 2023-11-16 DIAGNOSIS — N138 Other obstructive and reflux uropathy: Secondary | ICD-10-CM | POA: Diagnosis not present

## 2023-11-16 DIAGNOSIS — N401 Enlarged prostate with lower urinary tract symptoms: Secondary | ICD-10-CM | POA: Diagnosis not present

## 2023-11-16 DIAGNOSIS — N3281 Overactive bladder: Secondary | ICD-10-CM

## 2023-11-16 LAB — MICROSCOPIC EXAMINATION: Bacteria, UA: NONE SEEN

## 2023-11-16 LAB — URINALYSIS, ROUTINE W REFLEX MICROSCOPIC
Bilirubin, UA: NEGATIVE
Glucose, UA: NEGATIVE
Ketones, UA: NEGATIVE
Nitrite, UA: NEGATIVE
RBC, UA: NEGATIVE
Specific Gravity, UA: 1.02 (ref 1.005–1.030)
Urobilinogen, Ur: 1 mg/dL (ref 0.2–1.0)
pH, UA: 7 (ref 5.0–7.5)

## 2023-11-16 MED ORDER — ALFUZOSIN HCL ER 10 MG PO TB24: 10.0000 mg | ORAL_TABLET | Freq: Two times a day (BID) | ORAL | 3 refills | Status: DC

## 2023-11-16 MED ORDER — FINASTERIDE 5 MG PO TABS: 5.0000 mg | ORAL_TABLET | Freq: Every day | ORAL | 3 refills | Status: DC

## 2023-11-16 NOTE — Progress Notes (Signed)
 11/16/2023 10:28 AM   Gregory LABOR Jorgenson Sr. 1951-05-19 [id number]  Referring provider: Marvine Rush, MD [address] [place],  [place] [address]  Followup prostate cancer and OAB   HPI: Gregory Diaz is a 72yo here for followup for prostate cancer, BPh and OAB. PSA undetectable. He was diagnosed with lymphoma in 07/2023 and is currently on treatment at [facility]. IPSS 10 QOL 2. Urine stream strong. No straining to urinate. He had hematuria with cyclophasphamide which has since resolved.    PMH: Past Medical History:  Diagnosis Date   Abnormal SPEP 09/09/2015   Anxiety    Arthritis    DDD, low back   Early cataracts, bilateral    Enlarged prostate    Heart murmur    was told at age 18, but has not had any problems   Hypertension    MGUS (monoclonal gammopathy of unknown significance) 01/29/2016   Prostate cancer [facility])    Renal disorder     Surgical History: Past Surgical History:  Procedure Laterality Date   BACK SURGERY  2004, 2015   BIOPSY  06/05/2022   Procedure: BIOPSY;  Surgeon: Eartha Angelia Sieving, MD;  Location: [facility];  Service: Gastroenterology;;   COLONOSCOPY     COLONOSCOPY WITH PROPOFOL  N/A 06/05/2022   Procedure: COLONOSCOPY WITH PROPOFOL ;  Surgeon: Eartha Angelia Sieving, MD;  Location: [facility];  Service: Gastroenterology;  Laterality: N/A;  115 ASA 2   CYSTOSCOPY WITH INJECTION N/A 03/13/2022   Procedure: CYSTOSCOPY WITH INJECTION- BOTOX  100 units;  Surgeon: Sherrilee Belvie CROME, MD;  Location: [facility];  Service: Urology;  Laterality: N/A;   EYE SURGERY     L eye- as a child    FRACTURE SURGERY Right    thumb   HEMORROIDECTOMY     HERNIA REPAIR     umbilical hernia repair   MASS EXCISION Right 07/11/2016   Procedure: EXCISION 6CM RIGHT SHOULDER MASS;  Surgeon: Selinda Artist Moats, MD;  Location: [facility];  Service: Vascular;  Laterality: Right;   POLYPECTOMY  06/05/2022   Procedure: POLYPECTOMY;  Surgeon: Eartha Angelia Sieving, MD;   Location: [facility];  Service: Gastroenterology;;   PROSTATE BIOPSY     TONSILLECTOMY     WOUND DEBRIDEMENT N/A 10/25/2018   Procedure: DEBRIDEMENT OF MUSCLE AND FASCIA AT UMBILICUS;  Surgeon: Kallie Manuelita BROCKS, MD;  Location: [facility];  Service: General;  Laterality: N/A;    Home Medications:  Allergies as of 11/16/2023       Reactions   Penicillins Other (See Comments), Swelling   Has patient had a PCN reaction causing immediate rash, facial/tongue/throat swelling, SOB or lightheadedness with hypotension: Yes Has patient had a PCN reaction causing severe rash involving mucus membranes or skin necrosis: No Has patient had a PCN reaction that required hospitalization No Has patient had a PCN reaction occurring within the last 10 years: No If all of the above answers are NO, then may proceed with Cephalosporin use.        Medication List        Accurate as of November 16, 2023 10:28 AM. If you have any questions, ask your nurse or doctor.          acetaminophen  650 MG CR tablet Commonly known as: TYLENOL  Take 650-1,300 mg by mouth every 8 (eight) hours as needed for pain.   alfuzosin  10 MG 24 hr tablet Commonly known as: UROXATRAL  Take 1 tablet (10 mg total) by mouth in the morning and  at bedtime.   diclofenac  Sodium 1 % Gel Commonly known as: Voltaren  Apply 4 g topically 4 (four) times daily.   Eliquis 5 MG Tabs tablet Generic drug: apixaban Take 5 mg by mouth 2 (two) times daily.   escitalopram 20 MG tablet Commonly known as: LEXAPRO Take 20 mg by mouth daily.   fesoterodine  8 MG Tb24 tablet Commonly known as: TOVIAZ  Take 1 tablet (8 mg total) by mouth daily.   finasteride  5 MG tablet Commonly known as: PROSCAR  Take 1 tablet (5 mg total) by mouth daily.   gabapentin 300 MG capsule Commonly known as: NEURONTIN Take 300 mg by mouth. Tid as needed   lidocaine  5 % Commonly known as: Lidoderm  Place 1 patch onto the skin daily. Remove & Discard patch  within 12 hours or as directed by MD   lisinopril  20 MG tablet Commonly known as: ZESTRIL  Take 20 mg by mouth daily.   polyethylene glycol powder 17 GM/SCOOP powder Commonly known as: GLYCOLAX /MIRALAX  Take 17 g by mouth daily.   predniSONE  50 MG tablet Commonly known as: DELTASONE  Take 50 mg by mouth daily with breakfast. Take 2 tablets on days 1 -5 days of chemo.   PRESCRIPTION MEDICATION Magic mouthwash as needed   PRESCRIPTION MEDICATION Chemo at [facility].   sulfamethoxazole -trimethoprim  800-160 MG tablet Commonly known as: BACTRIM  DS Take 1 tablet by mouth 2 (two) times daily as needed (lipoma).   VITAMIN D3 PO Take 1 tablet by mouth daily.        Allergies:  Allergies  Allergen Reactions   Penicillins Other (See Comments) and Swelling    Has patient had a PCN reaction causing immediate rash, facial/tongue/throat swelling, SOB or lightheadedness with hypotension: Yes  Has patient had a PCN reaction causing severe rash involving mucus membranes or skin necrosis: No  Has patient had a PCN reaction that required hospitalization No  Has patient had a PCN reaction occurring within the last 10 years: No  If all of the above answers are NO, then may proceed with Cephalosporin use.    Family History: Family History  Problem Relation Age of Onset   Gallstones Father    Ulcers Father    Alcohol abuse Father    Diabetes Daughter    Diabetes Brother    Diabetes Sister    Breast cancer Sister     Social History:  reports that he has quit smoking. His smoking use included cigarettes. He has a 25 pack-year smoking history. He has been exposed to tobacco smoke. He has never used smokeless tobacco. He reports that he does not drink alcohol and does not use drugs.  ROS: All other review of systems were reviewed and are negative except what is noted above in HPI  Physical Exam: BP 128/89   Pulse 87   Constitutional:  Alert and oriented, No acute distress. HEENT:  [place] AT, moist mucus membranes.  Trachea midline, no masses. Cardiovascular: No clubbing, cyanosis, or edema. Respiratory: Normal respiratory effort, no increased work of breathing. GI: Abdomen is soft, nontender, nondistended, no abdominal masses GU: No CVA tenderness.  Lymph: No cervical or inguinal lymphadenopathy. Skin: No rashes, bruises or suspicious lesions. Neurologic: Grossly intact, no focal deficits, moving all 4 extremities. Psychiatric: Normal mood and affect.  Laboratory Data: Lab Results  Component Value Date   WBC 6.2 07/26/2023   HGB 14.9 07/26/2023   HCT 46.5 07/26/2023   MCV 84.1 07/26/2023   PLT 363 07/26/2023    Lab Results  Component Value  Date   CREATININE 1.24 07/26/2023    No results found for: PSA  No results found for: TESTOSTERONE   Lab Results  Component Value Date   HGBA1C 5.5 10/16/2018    Urinalysis    Component Value Date/Time   COLORURINE YELLOW 07/26/2023 2018   APPEARANCEUR HAZY (A) 07/26/2023 2018   APPEARANCEUR Clear 03/28/2023 1401   LABSPEC 1.019 07/26/2023 2018   PHURINE 5.0 07/26/2023 2018   GLUCOSEU NEGATIVE 07/26/2023 2018   HGBUR MODERATE (A) 07/26/2023 2018   BILIRUBINUR NEGATIVE 07/26/2023 2018   BILIRUBINUR Negative 03/28/2023 1401   KETONESUR NEGATIVE 07/26/2023 2018   PROTEINUR NEGATIVE 07/26/2023 2018   UROBILINOGEN 0.2 08/14/2022 0901   UROBILINOGEN 0.2 08/22/2015 1609   NITRITE NEGATIVE 07/26/2023 2018   LEUKOCYTESUR NEGATIVE 07/26/2023 2018    Lab Results  Component Value Date   LABMICR See below: 03/28/2023   WBCUA 6-10 (A) 03/28/2023   LABEPIT 0-10 03/28/2023   MUCUS Present 03/10/2022   BACTERIA RARE (A) 07/26/2023    Pertinent Imaging:  No results found for this or any previous visit.  No results found for this or any previous visit.  No results found for this or any previous visit.  No results found for this or any previous visit.  Results for orders placed during the hospital  encounter of 08/22/15  US  Renal  Narrative CLINICAL DATA:  Acute kidney injury  EXAM: RENAL / URINARY TRACT ULTRASOUND COMPLETE  COMPARISON:  None.  FINDINGS: Right Kidney:  Length: 15.6 cm. Numerous cysts in the upper and midpole all measuring less than or equal to 5 cm. No hydronephrosis. Normal echogenicity.  Left Kidney:  Length: 15.1 cm. Two lower pole cysts 1 measuring 4.4 cm in the other measuring 3.6 cm. No hydronephrosis. Normal echogenicity.  Bladder:  Appears normal for degree of bladder distention.  IMPRESSION: Bilateral simple cysts.  No acute abnormalities.   Electronically Signed By: Quintin Blizzard M.D. On: 08/24/2015 08:28  No results found for this or any previous visit.  Results for orders placed in visit on 02/07/23  CT HEMATURIA WORKUP  Narrative CLINICAL DATA:  Gross hematuria. Prior history of prostate carcinoma. * Tracking Code: BO *  EXAM: CT ABDOMEN AND PELVIS WITHOUT AND WITH CONTRAST  TECHNIQUE: Multidetector CT imaging of the abdomen and pelvis was performed following the standard protocol before and following the bolus administration of intravenous contrast.  RADIATION DOSE REDUCTION: This exam was performed according to the departmental dose-optimization program which includes automated exposure control, adjustment of the mA and/or kV according to patient size and/or use of iterative reconstruction technique.  CONTRAST:  100mL OMNIPAQUE  IOHEXOL  300 MG/ML  SOLN  COMPARISON:  None Available.  FINDINGS: Lower Chest: New pleural-based nodule in the posterior left lower lobe measuring 7 mm on image 13/9. New pleural-based nodule also seen in the medial right lower lobe measuring 2.8 x 2.0 cm on image 2/2. Two masses are seen in the right posterior and medial extrapleural spaces, largest measuring 3.0 x 2.1 cm on image 1/2.  Hepatobiliary: A few tiny hepatic cysts remains stable. No hepatic masses identified. Gallstones  are seen, however there is no evidence of cholecystitis or biliary dilatation.  Pancreas:  No mass or inflammatory changes.  Spleen: Within normal limits in size and appearance.  Adrenals/Urinary Tract: Stable 2.7 cm low-attenuation left adrenal mass, consistent with benign adenoma. Benign Bosniak category 1 cysts are again seen in both kidneys, also without significant change (no followup imaging is recommended). No suspicious renal  masses identified. No evidence of ureteral calculi or hydronephrosis. Chronic diffuse bladder wall thickening again noted, which could be due to chronic cystitis or chronic bladder outlet obstruction.  Stomach/Bowel: No evidence of obstruction, inflammatory process or abnormal fluid collections. Normal appendix visualized. Diffuse colonic diverticulosis is again noted, without evidence of diverticulitis.  Vascular/Lymphatic: No pathologically enlarged lymph nodes. No acute vascular findings. Aortic atherosclerotic calcification incidentally noted.  Reproductive: Stable mildly enlarged prostate gland with fiducial markers.  Other: A soft tissue mass is seen in the midline subcutaneous tissues of the posterior abdominal wall adjacent to the spinous process of T11-12, measuring 6.5 x 3.7 cm on image 12/2, compared to 2.8 x 2.0 cm previously. Increased size of multiple other soft tissue nodules are seen in the upper left buttock near the level of the iliac crest, with index lesion measuring 2.5 x 2.1 cm on image 46/2.  Musculoskeletal: No suspicious bone lesions identified. Lumbar spine fusion hardware seen from levels of L1 through L5.  IMPRESSION: Increased size of multiple soft tissue masses in the right posterior extrapleural space, posterior abdominal wall subcutaneous tissues, and upper left buttock, and new bilateral lower lobe pulmonary nodules. Metastatic disease cannot be excluded. Tissue sampling and/or PET-CT should be  considered.  Stable mildly enlarged prostate gland.  Stable diffuse bladder wall thickening, which could be due to chronic cystitis or chronic bladder outlet obstruction.  Colonic diverticulosis, without radiographic evidence of diverticulitis.  Cholelithiasis. No radiographic evidence of cholecystitis.  Stable benign left adrenal adenoma (no followup imaging is recommended) .  Aortic Atherosclerosis (ICD10-I70.0).   Electronically Signed By: Norleen DELENA Kil M.D. On: 03/14/2023 18:52  No results found for this or any previous visit.   Assessment & Plan:    1. Prostate cancer (HCC) (Primary) Followup 1 year with PSA - Urinalysis, Routine w reflex microscopic  2. OAB (overactive bladder) -resolved  3. Benign prostatic hyperplasia with urinary obstruction -continue uroxatral  10mg  BID and finasteride   4. Nocturia Continue uroxatral  10mg  BID   No follow-ups on file.  Belvie Clara, MD  [facility] [place]

## 2023-11-16 NOTE — Patient Instructions (Signed)

## 2023-11-27 DIAGNOSIS — D472 Monoclonal gammopathy: Secondary | ICD-10-CM | POA: Diagnosis not present

## 2023-11-27 DIAGNOSIS — Z5111 Encounter for antineoplastic chemotherapy: Secondary | ICD-10-CM | POA: Diagnosis not present

## 2023-11-27 DIAGNOSIS — C8330 Diffuse large B-cell lymphoma, unspecified site: Secondary | ICD-10-CM | POA: Diagnosis not present

## 2023-11-27 DIAGNOSIS — Z5112 Encounter for antineoplastic immunotherapy: Secondary | ICD-10-CM | POA: Diagnosis not present

## 2023-12-10 ENCOUNTER — Ambulatory Visit (INDEPENDENT_AMBULATORY_CARE_PROVIDER_SITE_OTHER): Payer: Medicare HMO | Admitting: Physician Assistant

## 2023-12-10 ENCOUNTER — Ambulatory Visit (HOSPITAL_COMMUNITY)
Admission: RE | Admit: 2023-12-10 | Discharge: 2023-12-10 | Disposition: A | Discharge status: Home / Self Care | Payer: Medicare HMO | Source: Ambulatory Visit | Attending: Vascular Surgery | Admitting: Vascular Surgery

## 2023-12-10 ENCOUNTER — Ambulatory Visit: Payer: Medicare HMO

## 2023-12-10 ENCOUNTER — Encounter (HOSPITAL_COMMUNITY): Payer: Medicare HMO

## 2023-12-10 VITALS — BP 127/88 | HR 81 | Temp 98.5°F | Resp 20 | Ht 73.0 in | Wt 252.5 lb

## 2023-12-10 DIAGNOSIS — I82402 Acute embolism and thrombosis of unspecified deep veins of left lower extremity: Secondary | ICD-10-CM

## 2023-12-10 DIAGNOSIS — M7989 Other specified soft tissue disorders: Secondary | ICD-10-CM | POA: Diagnosis not present

## 2023-12-10 NOTE — Progress Notes (Signed)
[facility]           OF   History and Physical   Gregory A Vanengen Sr. is a 73 y.o. male who presents for follow up.  Pt was initially seen by Dr. Edilia Bo 07/06/2023.  Pt was found to have a LLE DVT in January 2024 and was started on Eliquis.  He had a follow up study in August, which showed partial recanalization of the veins but some residual thrombus.  He was continuing to have persistent aching pain and heaviness that was aggravated by standing and relieved somewhat with elevation.  The sx were worse with constipation.  His only risk factor at the time was tobacco use.  He did not have any long travel, injury to the leg or recent surgery or cancer.  There was no family hx of DVT or clotting disorders.   He does have hx of prostate cancer and was seen by urology earlier this month and his PSA was undetectable.  Upon review of his chart, he was diagnosed with lymphoma in September 2024.  Oncologist note felt DVT likely provoked by malignancy.    Dr. Edilia Bo discussed the importance of exercise, wearing compression and daily leg elevation and maintaining a healthy weight.  Given he had persistent symptoms, he scheduled him for follow up duplex scan to determine if his Eliquis can be discontinued.  Given he had significant varicose veins, he was also scheduled for a reflux study.    He comes in today with his daughter.  He is concerned about his left leg swelling.  He states that it does improve with elevation.  He is currently undergoing chemotherapy for his lymphoma.  He has not been wearing his compression socks.  He does a lot of sitting because it is cold outside.  He does move about his house.  He states he is not smoking but his daughter endorses that he is.  He is still on his Eliquis.     The pt is not on a statin for cholesterol management.  The pt is not on a daily aspirin.   Other AC:  Eliquis The pt is on ACEI for hypertension.   The pt is not  on medication for diabetes.   Tobacco hx:  former  Pt does not have family hx of AAA.  Past Medical History:  Diagnosis Date   Abnormal SPEP 09/09/2015   Anxiety    Arthritis    DDD, low back   Early cataracts, bilateral    Enlarged prostate    Heart murmur    was told at age 30, but has not had any problems   Hypertension    MGUS (monoclonal gammopathy of unknown significance) 01/29/2016   Prostate cancer [facility])    Renal disorder     Past Surgical History:  Procedure Laterality Date   BACK SURGERY  2004, 2015   BIOPSY  06/05/2022   Procedure: BIOPSY;  Surgeon: Dolores Frame, MD;  Location: [facility];  Service: Gastroenterology;;   COLONOSCOPY     COLONOSCOPY WITH PROPOFOL N/A 06/05/2022   Procedure: COLONOSCOPY WITH PROPOFOL;  Surgeon: Dolores Frame, MD;  Location: [facility];  Service: Gastroenterology;  Laterality: N/A;  115 ASA 2   CYSTOSCOPY WITH INJECTION N/A 03/13/2022   Procedure: CYSTOSCOPY WITH INJECTION- BOTOX 100 units;  Surgeon: Malen Gauze, MD;  Location: [facility];  Service: Urology;  Laterality: N/A;   EYE SURGERY  L eye- as a child    FRACTURE SURGERY Right    thumb   HEMORROIDECTOMY     HERNIA REPAIR     umbilical hernia repair   MASS EXCISION Right 07/11/2016   Procedure: EXCISION 6CM RIGHT SHOULDER MASS;  Surgeon: Ancil Linsey, MD;  Location: [facility];  Service: Vascular;  Laterality: Right;   POLYPECTOMY  06/05/2022   Procedure: POLYPECTOMY;  Surgeon: Dolores Frame, MD;  Location: [facility];  Service: Gastroenterology;;   PROSTATE BIOPSY     TONSILLECTOMY     WOUND DEBRIDEMENT N/A 10/25/2018   Procedure: DEBRIDEMENT OF MUSCLE AND FASCIA AT UMBILICUS;  Surgeon: Lucretia Roers, MD;  Location: [facility];  Service: General;  Laterality: N/A;    Social History   Socioeconomic History   Marital status: Married    Spouse name: Not on file   Number of children: Not on file   Years of education: Not on  file   Highest education level: Not on file  Occupational History   Occupation: retire    Comment: 2014; truck driver  Tobacco Use   Smoking status: Former    Current packs/day: 0.50    Average packs/day: 0.5 packs/day for 50.0 years (25.0 ttl pk-yrs)    Types: Cigarettes    Passive exposure: Past   Smokeless tobacco: Never  Vaping Use   Vaping status: Never Used  Substance and Sexual Activity   Alcohol use: No   Drug use: No   Sexual activity: Yes    Birth control/protection: None  Other Topics Concern   Not on file  Social History Narrative   Not on file   Social Drivers of Health   Financial Resource Strain: Not on file  Food Insecurity: Low Risk  (09/26/2023)   Received from [facility]   Hunger Vital Sign    Worried About Running Out of Food in the Last Year: Never true    Ran Out of Food in the Last Year: Never true  Transportation Needs: No Transportation Needs (09/26/2023)   Received from [facility]    In the past 12 months, has lack of reliable transportation kept you from medical appointments, meetings, work or from getting things needed for daily living? : No  Physical Activity: Not on file  Stress: Not on file  Social Connections: Not on file  Intimate Partner Violence: Not on file     Family History  Problem Relation Age of Onset   Gallstones Father    Ulcers Father    Alcohol abuse Father    Diabetes Daughter    Diabetes Brother    Diabetes Sister    Breast cancer Sister     Current Outpatient Medications  Medication Sig Dispense Refill   acetaminophen (TYLENOL) 650 MG CR tablet Take 650-1,300 mg by mouth every 8 (eight) hours as needed for pain.     alfuzosin (UROXATRAL) 10 MG 24 hr tablet Take 1 tablet (10 mg total) by mouth in the morning and at bedtime. 180 tablet 3   apixaban (ELIQUIS) 5 MG TABS tablet Take 5 mg by mouth 2 (two) times daily.     Cholecalciferol (VITAMIN D3 PO) Take 1 tablet by mouth daily.      diclofenac Sodium (VOLTAREN) 1 % GEL Apply 4 g topically 4 (four) times daily. 100 g 0   escitalopram (LEXAPRO) 20 MG tablet Take 20 mg by mouth daily.     fesoterodine (TOVIAZ) 8 MG TB24 tablet Take  1 tablet (8 mg total) by mouth daily. 30 tablet 6   finasteride (PROSCAR) 5 MG tablet Take 1 tablet (5 mg total) by mouth daily. 90 tablet 3   gabapentin (NEURONTIN) 300 MG capsule Take 300 mg by mouth. Tid as needed     lidocaine (LIDODERM) 5 % Place 1 patch onto the skin daily. Remove & Discard patch within 12 hours or as directed by MD 30 patch 0   lisinopril (ZESTRIL) 20 MG tablet Take 20 mg by mouth daily.     polyethylene glycol powder (GLYCOLAX/MIRALAX) 17 GM/SCOOP powder Take 17 g by mouth daily. 255 g 0   predniSONE (DELTASONE) 50 MG tablet Take 50 mg by mouth daily with breakfast. Take 2 tablets on days 1 -5 days of chemo.     PRESCRIPTION MEDICATION Magic mouthwash as needed     PRESCRIPTION MEDICATION Chemo at [facility].     sulfamethoxazole-trimethoprim (BACTRIM DS) 800-160 MG tablet Take 1 tablet by mouth 2 (two) times daily as needed (lipoma).     No current facility-administered medications for this visit.   Facility-Administered Medications Ordered in Other Visits  Medication Dose Route Frequency Provider Last Rate Last Admin   Chlorhexidine Gluconate Cloth 2 % PADS 6 each  6 each Topical Once Ancil Linsey, MD       And   Chlorhexidine Gluconate Cloth 2 % PADS 6 each  6 each Topical Once Ancil Linsey, MD        Allergies  Allergen Reactions   Penicillins Other (See Comments) and Swelling    Has patient had a PCN reaction causing immediate rash, facial/tongue/throat swelling, SOB or lightheadedness with hypotension: Yes  Has patient had a PCN reaction causing severe rash involving mucus membranes or skin necrosis: No  Has patient had a PCN reaction that required hospitalization No  Has patient had a PCN reaction occurring within the last 10 years: No  If all of  the above answers are "NO", then may proceed with Cephalosporin use.    REVIEW OF SYSTEMS:   [X]  denotes positive finding, [ ]  denotes negative finding Cardiac  Comments:  Chest pain or chest pressure:    Shortness of breath upon exertion:    Short of breath when lying flat:    Irregular heart rhythm:        Vascular    Pain in calf, thigh, or hip brought on by ambulation:    Pain in feet at night that wakes you up from your sleep:     Blood clot in your veins: x   Leg swelling:  x       Pulmonary    Oxygen at home:    Productive cough:     Wheezing:         Neurologic    Sudden weakness in arms or legs:     Sudden numbness in arms or legs:     Sudden onset of difficulty speaking or slurred speech:    Temporary loss of vision in one eye:     Problems with dizziness:         Gastrointestinal    Blood in stool:     Vomited blood:         Genitourinary    Burning when urinating:     Blood in urine:        Psychiatric    Major depression:         Hematologic    Bleeding problems:    Problems  with blood clotting too easily:        Skin    Rashes or ulcers:        Constitutional    Fever or chills:      PHYSICAL EXAMINATION:  Today's Vitals   12/10/23 1357  BP: 127/88  Pulse: 81  Resp: 20  Temp: 98.5 F (36.9 C)  TempSrc: Temporal  SpO2: 98%  Weight: 252 lb 8 oz (114.5 kg)  Height: 6\' 1"  (1.854 m)   Body mass index is 33.31 kg/m.   General:  WDWN in NAD; vital signs documented above Gait: Not observed HENT: WNL, normocephalic Pulmonary: normal non-labored breathing without wheezing Cardiac: regular HR; without carotid bruits Abdomen: soft, NT, aortic pulse is not palpable Skin: without rashes Vascular Exam/Pulses:  Right Left  Radial 2+ (normal) 2+ (normal)  DP 2+ (normal) 2+ (normal)   Extremities: BLE swelling with right ankle swelling and LLE swelling  Neurologic: A&O X 3;  moving all extremities equally Psychiatric:  The pt has Normal  affect.   Non-Invasive Vascular Imaging:   Venous duplex on 12/10/2023: +------------------+---------+------+-----------+------------+-------------  ----+LEFT             Reflux NoRefluxReflux TimeDiameter cmsComments                                  Yes                                       +------------------+---------+------+-----------+------------+-------------  CFV              no                                                  +------------------+---------+------+-----------+------------+-------------  FV dist                                                  chronic thrombus   +------------------+---------+------+-----------+------------+-------------  Popliteal                                               Partial Chronic thrombus      +------------------+---------+------+-----------+------------+-------------   GSV at SFJ                         >500 ms     0.554                  +------------------+---------+------+-----------+------------+-------------  GSV prox thigh    no                           0.375                  +------------------+---------+------+-----------+------------+-------------  GSV mid thigh     no  0.397                  +------------------+---------+------+-----------+------------+-------------  GSV dist thigh                                 0.228                  +------------------+---------+------+-----------+------------+-------------  GSV at knee       no                            0.3                   +------------------+---------+------+-----------+------------+-------------   SSV Pop Fossa     no                            0.51                  +------------------+---------+------+-----------+------------+-------------   SSV prox calf     no                            0.41                   +------------------+---------+------+-----------+------------+-------------  SSV mid calf      no                            0.42                  +------------------+---------+------+-----------+------------+-------------  PTV distal                                              patent        +------------------+---------+------+-----------+------------+-------------  Peroneal vein                                            Partial Chronic distal thrombus in one of the paired veins.        +------------------+---------+------+-----------+------------+-------------   Summary:  Right:  - No evidence of superficial venous thrombosis in the right lower extremity.  - Chronic partial thrombus in the distal FV, popliteal vein, one of two peroneal veins.  - No evidence of deep vein reflux.  - Superficial vein reflux in the SFJ.  - Interstitial fluid in the calf     Gregory A Duffus Sr. is a 73 y.o. male who presents with: LLE swelling with hx of DVT left leg probably provoked by cancer  -pt has easily palpable DP  pedal pulses bilaterally -pt does  have evidence of chronic thrombus in the right leg.  Pt does have venous reflux on in the left GSV at the [facility] -discussed with pt about wearing knee high 15-20 mmHg compression stockings and pt was measured for these today.   Discussed putting these on before getting out of bed and taking them off at night.   -discussed the importance of leg elevation and how to elevate properly - pt is advised to elevate their legs and  a diagram is given to them to demonstrate for pt to lay flat on their back with knees elevated and slightly bent with their feet higher than their knees, which puts their feet higher than their heart for 15 minutes per day.  If pt cannot lay flat, advised to lay as flat as possible.  -pt is advised to continue as much walking as possible and avoid sitting or standing for long periods of time.  -discussed importance of  weight loss and exercise and that water aerobics would also be beneficial.  His daughter has been trying to get him to go to water exercise.   -handout with recommendations given -pt will f/u as needed. -will defer to hem/onc on how long pt will need to be on Eliquis given he is still undergoing treatment for lymphoma.  -discussed the importance of not smoking   Doreatha Massed, [facility] [phone]  Clinic MD:  Myra Gianotti

## 2023-12-14 DIAGNOSIS — C8330 Diffuse large B-cell lymphoma, unspecified site: Secondary | ICD-10-CM | POA: Diagnosis not present

## 2023-12-14 DIAGNOSIS — Z86718 Personal history of other venous thrombosis and embolism: Secondary | ICD-10-CM | POA: Diagnosis not present

## 2023-12-14 DIAGNOSIS — Z923 Personal history of irradiation: Secondary | ICD-10-CM | POA: Diagnosis not present

## 2023-12-14 DIAGNOSIS — C83398 Diffuse large b-cell lymphoma of other extranodal and solid organ sites: Secondary | ICD-10-CM | POA: Diagnosis not present

## 2023-12-14 DIAGNOSIS — C61 Malignant neoplasm of prostate: Secondary | ICD-10-CM | POA: Diagnosis not present

## 2023-12-14 DIAGNOSIS — Z79620 Long term (current) use of immunosuppressive biologic: Secondary | ICD-10-CM | POA: Diagnosis not present

## 2023-12-14 DIAGNOSIS — Z7901 Long term (current) use of anticoagulants: Secondary | ICD-10-CM | POA: Diagnosis not present

## 2023-12-14 DIAGNOSIS — Z79633 Long term (current) use of mitotic inhibitor: Secondary | ICD-10-CM | POA: Diagnosis not present

## 2023-12-14 DIAGNOSIS — Z7952 Long term (current) use of systemic steroids: Secondary | ICD-10-CM | POA: Diagnosis not present

## 2023-12-14 DIAGNOSIS — Z79630 Long term (current) use of alkylating agent: Secondary | ICD-10-CM | POA: Diagnosis not present

## 2023-12-19 DIAGNOSIS — D472 Monoclonal gammopathy: Secondary | ICD-10-CM | POA: Diagnosis not present

## 2023-12-19 DIAGNOSIS — Z5111 Encounter for antineoplastic chemotherapy: Secondary | ICD-10-CM | POA: Diagnosis not present

## 2023-12-19 DIAGNOSIS — C8338 Diffuse large B-cell lymphoma, lymph nodes of multiple sites: Secondary | ICD-10-CM | POA: Diagnosis not present

## 2023-12-19 DIAGNOSIS — Z5112 Encounter for antineoplastic immunotherapy: Secondary | ICD-10-CM | POA: Diagnosis not present

## 2023-12-19 DIAGNOSIS — C8330 Diffuse large B-cell lymphoma, unspecified site: Secondary | ICD-10-CM | POA: Diagnosis not present

## 2023-12-28 DIAGNOSIS — C8330 Diffuse large B-cell lymphoma, unspecified site: Secondary | ICD-10-CM | POA: Diagnosis not present

## 2024-01-01 DIAGNOSIS — F39 Unspecified mood [affective] disorder: Secondary | ICD-10-CM | POA: Diagnosis not present

## 2024-01-01 DIAGNOSIS — F0631 Mood disorder due to known physiological condition with depressive features: Secondary | ICD-10-CM | POA: Diagnosis not present

## 2024-01-01 DIAGNOSIS — C8330 Diffuse large B-cell lymphoma, unspecified site: Secondary | ICD-10-CM | POA: Diagnosis not present

## 2024-01-01 DIAGNOSIS — I82402 Acute embolism and thrombosis of unspecified deep veins of left lower extremity: Secondary | ICD-10-CM | POA: Diagnosis not present

## 2024-01-01 DIAGNOSIS — E1140 Type 2 diabetes mellitus with diabetic neuropathy, unspecified: Secondary | ICD-10-CM | POA: Diagnosis not present

## 2024-01-01 DIAGNOSIS — Z66 Do not resuscitate: Secondary | ICD-10-CM | POA: Diagnosis not present

## 2024-01-01 DIAGNOSIS — N179 Acute kidney failure, unspecified: Secondary | ICD-10-CM | POA: Diagnosis not present

## 2024-01-01 DIAGNOSIS — Z7901 Long term (current) use of anticoagulants: Secondary | ICD-10-CM | POA: Diagnosis not present

## 2024-01-01 DIAGNOSIS — C61 Malignant neoplasm of prostate: Secondary | ICD-10-CM | POA: Diagnosis not present

## 2024-01-01 DIAGNOSIS — Z5111 Encounter for antineoplastic chemotherapy: Secondary | ICD-10-CM | POA: Diagnosis not present

## 2024-01-01 DIAGNOSIS — C83398 Diffuse large b-cell lymphoma of other extranodal and solid organ sites: Secondary | ICD-10-CM | POA: Diagnosis not present

## 2024-01-01 DIAGNOSIS — D6481 Anemia due to antineoplastic chemotherapy: Secondary | ICD-10-CM | POA: Diagnosis not present

## 2024-01-01 DIAGNOSIS — T451X5A Adverse effect of antineoplastic and immunosuppressive drugs, initial encounter: Secondary | ICD-10-CM | POA: Diagnosis not present

## 2024-01-16 DIAGNOSIS — F17210 Nicotine dependence, cigarettes, uncomplicated: Secondary | ICD-10-CM | POA: Diagnosis not present

## 2024-01-16 DIAGNOSIS — Z923 Personal history of irradiation: Secondary | ICD-10-CM | POA: Diagnosis not present

## 2024-01-16 DIAGNOSIS — C83398 Diffuse large b-cell lymphoma of other extranodal and solid organ sites: Secondary | ICD-10-CM | POA: Diagnosis not present

## 2024-01-16 DIAGNOSIS — Z7901 Long term (current) use of anticoagulants: Secondary | ICD-10-CM | POA: Diagnosis not present

## 2024-01-16 DIAGNOSIS — Z08 Encounter for follow-up examination after completed treatment for malignant neoplasm: Secondary | ICD-10-CM | POA: Diagnosis not present

## 2024-01-16 DIAGNOSIS — C8338 Diffuse large B-cell lymphoma, lymph nodes of multiple sites: Secondary | ICD-10-CM | POA: Diagnosis not present

## 2024-01-16 DIAGNOSIS — Z8546 Personal history of malignant neoplasm of prostate: Secondary | ICD-10-CM | POA: Diagnosis not present

## 2024-01-16 DIAGNOSIS — I10 Essential (primary) hypertension: Secondary | ICD-10-CM | POA: Diagnosis not present

## 2024-01-16 DIAGNOSIS — R229 Localized swelling, mass and lump, unspecified: Secondary | ICD-10-CM | POA: Diagnosis not present

## 2024-01-16 DIAGNOSIS — C8330 Diffuse large B-cell lymphoma, unspecified site: Secondary | ICD-10-CM | POA: Diagnosis not present

## 2024-01-21 DIAGNOSIS — C8338 Diffuse large B-cell lymphoma, lymph nodes of multiple sites: Secondary | ICD-10-CM | POA: Diagnosis not present

## 2024-01-21 DIAGNOSIS — Z510 Encounter for antineoplastic radiation therapy: Secondary | ICD-10-CM | POA: Diagnosis not present

## 2024-01-21 DIAGNOSIS — I824Z2 Acute embolism and thrombosis of unspecified deep veins of left distal lower extremity: Secondary | ICD-10-CM | POA: Diagnosis not present

## 2024-01-21 DIAGNOSIS — M5416 Radiculopathy, lumbar region: Secondary | ICD-10-CM | POA: Diagnosis not present

## 2024-01-23 ENCOUNTER — Telehealth: Payer: Self-pay | Admitting: Radiation Oncology

## 2024-01-23 NOTE — Telephone Encounter (Signed)
 Received a urgent medical record request from [facility]. Request forwarded to dosimetry.

## 2024-01-24 ENCOUNTER — Telehealth: Payer: Self-pay

## 2024-01-24 DIAGNOSIS — H6121 Impacted cerumen, right ear: Secondary | ICD-10-CM | POA: Diagnosis not present

## 2024-01-24 NOTE — Telephone Encounter (Signed)
 3/13 Received call from [facility]) needing DOSIM records email sent to Alveria Apley and copied Lisa Roca, so they are aware.

## 2024-01-25 DIAGNOSIS — H903 Sensorineural hearing loss, bilateral: Secondary | ICD-10-CM | POA: Diagnosis not present

## 2024-01-28 ENCOUNTER — Encounter (INDEPENDENT_AMBULATORY_CARE_PROVIDER_SITE_OTHER): Payer: Self-pay | Admitting: Gastroenterology

## 2024-01-28 ENCOUNTER — Ambulatory Visit (INDEPENDENT_AMBULATORY_CARE_PROVIDER_SITE_OTHER): Payer: Medicare HMO | Admitting: Gastroenterology

## 2024-01-28 VITALS — BP 133/87 | HR 101 | Temp 98.0°F | Ht 73.0 in | Wt 251.8 lb

## 2024-01-28 DIAGNOSIS — C8338 Diffuse large B-cell lymphoma, lymph nodes of multiple sites: Secondary | ICD-10-CM | POA: Diagnosis not present

## 2024-01-28 DIAGNOSIS — Z510 Encounter for antineoplastic radiation therapy: Secondary | ICD-10-CM | POA: Diagnosis not present

## 2024-01-28 DIAGNOSIS — M5416 Radiculopathy, lumbar region: Secondary | ICD-10-CM | POA: Diagnosis not present

## 2024-01-28 DIAGNOSIS — K5909 Other constipation: Secondary | ICD-10-CM | POA: Diagnosis not present

## 2024-01-28 DIAGNOSIS — I824Z2 Acute embolism and thrombosis of unspecified deep veins of left distal lower extremity: Secondary | ICD-10-CM | POA: Diagnosis not present

## 2024-01-28 DIAGNOSIS — K5903 Drug induced constipation: Secondary | ICD-10-CM

## 2024-01-28 NOTE — Patient Instructions (Signed)
-  Increase water intake, aim for atleast 64 oz per day -Increase fruits, veggies and whole grains, kiwi and prunes are especially good for constipation -continue prune laxative/miralax as needed  -avoid straining and limit toilet time -repeat colonoscopy July 2026  Follow up 1 year  It was a pleasure to see you today. I want to create trusting relationships with patients and provide genuine, compassionate, and quality care. I truly value your feedback! please be on the lookout for a survey regarding your visit with me today. I appreciate your input about our visit and your time in completing this!    Gregory Diaz L. Jeanmarie Hubert, MSN, APRN, AGNP-C Adult-Gerontology Nurse Practitioner [facility]astro[facility]ology at [facility]

## 2024-01-28 NOTE — Progress Notes (Addendum)
 Referring Provider: Assunta Found, MD Primary Care Physician:  Assunta Found, MD Primary GI Physician: Dr. Levon Hedger   Chief Complaint  Patient presents with   Follow-up    Patient here today for a follow up on constipation. He says he is still having issues with constipation as he is getting treatment for cancer. He says the oncologist told him he would have issues with constipation while on treatment for this. He is taking Miralax 17 g prn.    HPI:   Gregory Slemmer Sousa Sr. is a 73 y.o. male with past medical history of anxiety, arthritis, enlarged prostate, HTN, non hodgkins lymphoma, prostate cancer   Patient presenting today for:  Follow up of constipation   Last seen December 2024, at that time patient reported he started having some constipation after starting a new cancer treatment.  Taking MiraLAX every other day.  Reports some intermittent rectal bleeding.  Started drinking more water.  Report feeling symptoms hemorrhoids protruding out, and Preparation H was improvement.  Patient recommended to continue MiraLAX daily, add stool softener once daily, continue good water intake and avoid straining and limit long periods of time on toilet, continue with diet high in fruits, vegetables whole grains  Present: Denies abdominal pain. States he has some intermittent constipation which has been chronic for almost 15 years, he has been told in the past this was related to medications he is on. He states that he is taking miralax or prune laxative maybe 1-2 times per months. Note miralax gave him diarrhea even with just 1 capful so he uses this only if he absolutely needs it. He notes that stools can become really hard and he will have to strain to go, which is when he may see some blood in his stools. He has used preparation H in the past for hemorrhoids though recently not using anything as he has not felt that he needs to. He is trying to drink a good amount of water. Trying to do plenty of  fruits and veggies in his diet. He denies nausea or vomiting. He has actually gained some weight.    Last Colonoscopy:05/2022- Hemorrhoids found on perianal exam. - Nine 3 to 8 mm polyps in the transverse colon, in the ascending colon and in the cecum, removed with  a cold snare. Resected and retrieved. - One 2 mm polyp in the ascending colon, removed   with a cold biopsy forceps. Resected and retrieved. - Two 3 to 5 mm polyps in the descending colon,  removed with a cold snare. Resected and retrieved. - Diverticulosis in the sigmoid colon and in the descending colon.  - Non-bleeding external and internal hemorrhoids. 12- TAs   Repeat Colonoscopy in 3 years   Filed Weights   01/28/24 1408  Weight: 251 lb 12.8 oz (114.2 kg)     Past Medical History:  Diagnosis Date   Abnormal SPEP 09/09/2015   Anxiety    Arthritis    DDD, low back   Early cataracts, bilateral    Enlarged prostate    Heart murmur    was told at age 54, but has not had any problems   Hypertension    MGUS (monoclonal gammopathy of unknown significance) 01/29/2016   Peripheral vascular disease (HCC)    Prostate cancer [facility])    Renal disorder     Past Surgical History:  Procedure Laterality Date   BACK SURGERY  2004, 2015   BIOPSY  06/05/2022   Procedure: BIOPSY;  Surgeon: Levon Hedger  Alisia Ferrari, MD;  Location: [facility];  Service: Gastroenterology;;   COLONOSCOPY     COLONOSCOPY WITH PROPOFOL N/A 06/05/2022   Procedure: COLONOSCOPY WITH PROPOFOL;  Surgeon: Dolores Frame, MD;  Location: [facility];  Service: Gastroenterology;  Laterality: N/A;  115 ASA 2   CYSTOSCOPY WITH INJECTION N/A 03/13/2022   Procedure: CYSTOSCOPY WITH INJECTION- BOTOX 100 units;  Surgeon: Malen Gauze, MD;  Location: [facility];  Service: Urology;  Laterality: N/A;   EYE SURGERY     L eye- as a child    FRACTURE SURGERY Right    thumb   HEMORROIDECTOMY     HERNIA REPAIR     umbilical hernia repair   MASS  EXCISION Right 07/11/2016   Procedure: EXCISION 6CM RIGHT SHOULDER MASS;  Surgeon: Ancil Linsey, MD;  Location: [facility];  Service: Vascular;  Laterality: Right;   POLYPECTOMY  06/05/2022   Procedure: POLYPECTOMY;  Surgeon: Dolores Frame, MD;  Location: [facility];  Service: Gastroenterology;;   PROSTATE BIOPSY     TONSILLECTOMY     WOUND DEBRIDEMENT N/A 10/25/2018   Procedure: DEBRIDEMENT OF MUSCLE AND FASCIA AT UMBILICUS;  Surgeon: Lucretia Roers, MD;  Location: [facility];  Service: General;  Laterality: N/A;    Current Outpatient Medications  Medication Sig Dispense Refill   acetaminophen (TYLENOL) 650 MG CR tablet Take 650-1,300 mg by mouth every 8 (eight) hours as needed for pain.     apixaban (ELIQUIS) 5 MG TABS tablet Take 5 mg by mouth 2 (two) times daily.     Cholecalciferol (VITAMIN D3 PO) Take 1 tablet by mouth daily.     diclofenac Sodium (VOLTAREN) 1 % GEL Apply 4 g topically 4 (four) times daily. 100 g 0   escitalopram (LEXAPRO) 20 MG tablet Take 20 mg by mouth daily.     gabapentin (NEURONTIN) 300 MG capsule Take 300 mg by mouth. Tid as needed     lidocaine (LIDODERM) 5 % Place 1 patch onto the skin daily. Remove & Discard patch within 12 hours or as directed by MD 30 patch 0   lisinopril (ZESTRIL) 20 MG tablet Take 20 mg by mouth daily.     polyethylene glycol powder (GLYCOLAX/MIRALAX) 17 GM/SCOOP powder Take 17 g by mouth daily. 255 g 0   PRESCRIPTION MEDICATION Magic mouthwash as needed     PRESCRIPTION MEDICATION Chemo at [facility].     alfuzosin (UROXATRAL) 10 MG 24 hr tablet Take 1 tablet (10 mg total) by mouth in the morning and at bedtime. (Patient not taking: Reported on 01/28/2024) 180 tablet 3   fesoterodine (TOVIAZ) 8 MG TB24 tablet Take 1 tablet (8 mg total) by mouth daily. (Patient not taking: Reported on 01/28/2024) 30 tablet 6   finasteride (PROSCAR) 5 MG tablet Take 1 tablet (5 mg total) by mouth daily. 90 tablet 3   predniSONE (DELTASONE) 50 MG  tablet Take 50 mg by mouth daily with breakfast. Take 2 tablets on days 1 -5 days of chemo. (Patient not taking: Reported on 01/28/2024)     No current facility-administered medications for this visit.   Facility-Administered Medications Ordered in Other Visits  Medication Dose Route Frequency Provider Last Rate Last Admin   Chlorhexidine Gluconate Cloth 2 % PADS 6 each  6 each Topical Once Ancil Linsey, MD       And   Chlorhexidine Gluconate Cloth 2 % PADS 6 each  6 each Topical Once Ancil Linsey, MD  Allergies as of 01/28/2024 - Review Complete 01/28/2024  Allergen Reaction Noted   Penicillins Other (See Comments) and Swelling 03/31/2012    Social History   Socioeconomic History   Marital status: Married    Spouse name: Not on file   Number of children: Not on file   Years of education: Not on file   Highest education level: Not on file  Occupational History   Occupation: retire    Comment: 2014; truck driver  Tobacco Use   Smoking status: Former    Current packs/day: 0.50    Average packs/day: 0.5 packs/day for 50.0 years (25.0 ttl pk-yrs)    Types: Cigarettes    Passive exposure: Past   Smokeless tobacco: Never  Vaping Use   Vaping status: Never Used  Substance and Sexual Activity   Alcohol use: No   Drug use: No   Sexual activity: Yes    Birth control/protection: None  Other Topics Concern   Not on file  Social History Narrative   Not on file   Social Drivers of Health   Financial Resource Strain: Not on file  Food Insecurity: Low Risk  (01/01/2024)   Received from [facility]   Hunger Vital Sign    Worried About Running Out of Food in the Last Year: Never true    Ran Out of Food in the Last Year: Never true  Transportation Needs: No Transportation Needs (01/01/2024)   Received from [facility]    In the past 12 months, has lack of reliable transportation kept you from medical appointments, meetings, work or from getting  things needed for daily living? : No  Physical Activity: Not on file  Stress: Not on file  Social Connections: Not on file    Review of systems General: negative for malaise, night sweats, fever, chills, weight loss Neck: Negative for lumps, goiter, pain and significant neck swelling Resp: Negative for cough, wheezing, dyspnea at rest CV: Negative for chest pain, leg swelling, palpitations, orthopnea GI: denies melena, nausea, vomiting, diarrhea, dysphagia, odyonophagia, early satiety or unintentional weight loss. +rectal bleeding +constipation  The remainder of the review of systems is noncontributory.  Physical Exam: BP 133/87 (BP Location: Right Arm, Patient Position: Sitting, Cuff Size: Large)   Pulse (!) 101   Temp 98 F (36.7 C) (Temporal)   Ht 6\' 1"  (1.854 m)   Wt 251 lb 12.8 oz (114.2 kg)   BMI 33.22 kg/m  General:   Alert and oriented. No distress noted. Pleasant and cooperative.  Head:  Normocephalic and atraumatic. Eyes:  Conjuctiva clear without scleral icterus. Mouth:  Oral mucosa pink and moist. Good dentition. No lesions. Heart: Normal rate and rhythm, s1 and s2 heart sounds present.  Lungs: Clear lung sounds in all lobes. Respirations equal and unlabored. Abdomen:  +BS, soft, non-tender and non-distended. No rebound or guarding. No HSM or masses noted. Neurologic:  Alert and  oriented x4 Psych:  Alert and cooperative. Normal mood and affect.  Invalid input(s): "6 MONTHS"   ASSESSMENT: Gregory A Shinall Sr. is a 73 y.o. male presenting today for follow up of constipation   Constipation: has been chronic for many years, thought secondary to some of his medications. occasional rectal bleeding only at times of straining or having harder stools.  Relatively recent colonoscopy in 2023 with polyps and presence of hemorrhoids.  Continue mild anemia, intermittent pancytopenia that began after initiation of chemotherapy.  He reports that he generally has 1-2 bowel movements  per  day, only needs something for constipation to be months twice a month, has good results with prune laxative, currently takes MiraLAX assistance given diarrhea.  He denies any abdominal pain, weight loss, changes in appetite, nausea, vomiting, melena.  At this time would recommend continue with current regimen, should ensure water intake is good, diet is high in fruits, veggies, whole grains and should limit over time and avoid straining.  Will plan for repeat colonoscopy again in July 2026 unless he has new or worsening GI symptoms prior to this   PLAN:  -Increase water intake, aim for atleast 64 oz per day Increase fruits, veggies and whole grains, kiwi and prunes are especially good for constipation -continue prune laxative/miralax PRN -avoid straining and limit toilet time -repeat colonoscopy next year  All questions were answered, patient verbalized understanding and is in agreement with plan as outlined above.   Follow Up: 1 year   Gregory Grey L. Jeanmarie Hubert, MSN, APRN, AGNP-C Adult-Gerontology Nurse Practitioner [facility]  I have reviewed the note and agree with the APP's assessment as described in this progress note  Katrinka Blazing, MD Gastroenterology and Hepatology [facility]

## 2024-01-29 DIAGNOSIS — C8338 Diffuse large B-cell lymphoma, lymph nodes of multiple sites: Secondary | ICD-10-CM | POA: Diagnosis not present

## 2024-01-29 DIAGNOSIS — Z510 Encounter for antineoplastic radiation therapy: Secondary | ICD-10-CM | POA: Diagnosis not present

## 2024-01-29 DIAGNOSIS — I824Z2 Acute embolism and thrombosis of unspecified deep veins of left distal lower extremity: Secondary | ICD-10-CM | POA: Diagnosis not present

## 2024-01-29 DIAGNOSIS — M5416 Radiculopathy, lumbar region: Secondary | ICD-10-CM | POA: Diagnosis not present

## 2024-01-30 DIAGNOSIS — C8338 Diffuse large B-cell lymphoma, lymph nodes of multiple sites: Secondary | ICD-10-CM | POA: Diagnosis not present

## 2024-01-30 DIAGNOSIS — I824Z2 Acute embolism and thrombosis of unspecified deep veins of left distal lower extremity: Secondary | ICD-10-CM | POA: Diagnosis not present

## 2024-01-30 DIAGNOSIS — M5416 Radiculopathy, lumbar region: Secondary | ICD-10-CM | POA: Diagnosis not present

## 2024-01-30 DIAGNOSIS — Z510 Encounter for antineoplastic radiation therapy: Secondary | ICD-10-CM | POA: Diagnosis not present

## 2024-01-31 DIAGNOSIS — C8338 Diffuse large B-cell lymphoma, lymph nodes of multiple sites: Secondary | ICD-10-CM | POA: Diagnosis not present

## 2024-01-31 DIAGNOSIS — M5416 Radiculopathy, lumbar region: Secondary | ICD-10-CM | POA: Diagnosis not present

## 2024-01-31 DIAGNOSIS — I824Z2 Acute embolism and thrombosis of unspecified deep veins of left distal lower extremity: Secondary | ICD-10-CM | POA: Diagnosis not present

## 2024-01-31 DIAGNOSIS — Z510 Encounter for antineoplastic radiation therapy: Secondary | ICD-10-CM | POA: Diagnosis not present

## 2024-02-01 DIAGNOSIS — M5416 Radiculopathy, lumbar region: Secondary | ICD-10-CM | POA: Diagnosis not present

## 2024-02-01 DIAGNOSIS — C8338 Diffuse large B-cell lymphoma, lymph nodes of multiple sites: Secondary | ICD-10-CM | POA: Diagnosis not present

## 2024-02-01 DIAGNOSIS — Z510 Encounter for antineoplastic radiation therapy: Secondary | ICD-10-CM | POA: Diagnosis not present

## 2024-02-01 DIAGNOSIS — I824Z2 Acute embolism and thrombosis of unspecified deep veins of left distal lower extremity: Secondary | ICD-10-CM | POA: Diagnosis not present

## 2024-02-04 DIAGNOSIS — Z510 Encounter for antineoplastic radiation therapy: Secondary | ICD-10-CM | POA: Diagnosis not present

## 2024-02-04 DIAGNOSIS — M5416 Radiculopathy, lumbar region: Secondary | ICD-10-CM | POA: Diagnosis not present

## 2024-02-04 DIAGNOSIS — C8338 Diffuse large B-cell lymphoma, lymph nodes of multiple sites: Secondary | ICD-10-CM | POA: Diagnosis not present

## 2024-02-04 DIAGNOSIS — I824Z2 Acute embolism and thrombosis of unspecified deep veins of left distal lower extremity: Secondary | ICD-10-CM | POA: Diagnosis not present

## 2024-02-05 DIAGNOSIS — C8338 Diffuse large B-cell lymphoma, lymph nodes of multiple sites: Secondary | ICD-10-CM | POA: Diagnosis not present

## 2024-02-05 DIAGNOSIS — M5416 Radiculopathy, lumbar region: Secondary | ICD-10-CM | POA: Diagnosis not present

## 2024-02-05 DIAGNOSIS — Z510 Encounter for antineoplastic radiation therapy: Secondary | ICD-10-CM | POA: Diagnosis not present

## 2024-02-05 DIAGNOSIS — I824Z2 Acute embolism and thrombosis of unspecified deep veins of left distal lower extremity: Secondary | ICD-10-CM | POA: Diagnosis not present

## 2024-02-06 DIAGNOSIS — Z510 Encounter for antineoplastic radiation therapy: Secondary | ICD-10-CM | POA: Diagnosis not present

## 2024-02-06 DIAGNOSIS — M5416 Radiculopathy, lumbar region: Secondary | ICD-10-CM | POA: Diagnosis not present

## 2024-02-06 DIAGNOSIS — I824Z2 Acute embolism and thrombosis of unspecified deep veins of left distal lower extremity: Secondary | ICD-10-CM | POA: Diagnosis not present

## 2024-02-06 DIAGNOSIS — C8338 Diffuse large B-cell lymphoma, lymph nodes of multiple sites: Secondary | ICD-10-CM | POA: Diagnosis not present

## 2024-02-07 DIAGNOSIS — M5416 Radiculopathy, lumbar region: Secondary | ICD-10-CM | POA: Diagnosis not present

## 2024-02-07 DIAGNOSIS — Z510 Encounter for antineoplastic radiation therapy: Secondary | ICD-10-CM | POA: Diagnosis not present

## 2024-02-07 DIAGNOSIS — C8338 Diffuse large B-cell lymphoma, lymph nodes of multiple sites: Secondary | ICD-10-CM | POA: Diagnosis not present

## 2024-02-07 DIAGNOSIS — I824Z2 Acute embolism and thrombosis of unspecified deep veins of left distal lower extremity: Secondary | ICD-10-CM | POA: Diagnosis not present

## 2024-02-08 DIAGNOSIS — C8338 Diffuse large B-cell lymphoma, lymph nodes of multiple sites: Secondary | ICD-10-CM | POA: Diagnosis not present

## 2024-02-08 DIAGNOSIS — I82532 Chronic embolism and thrombosis of left popliteal vein: Secondary | ICD-10-CM | POA: Diagnosis not present

## 2024-02-08 DIAGNOSIS — Z7901 Long term (current) use of anticoagulants: Secondary | ICD-10-CM | POA: Diagnosis not present

## 2024-02-08 DIAGNOSIS — M5416 Radiculopathy, lumbar region: Secondary | ICD-10-CM | POA: Diagnosis not present

## 2024-02-08 DIAGNOSIS — Z510 Encounter for antineoplastic radiation therapy: Secondary | ICD-10-CM | POA: Diagnosis not present

## 2024-02-08 DIAGNOSIS — I824Z2 Acute embolism and thrombosis of unspecified deep veins of left distal lower extremity: Secondary | ICD-10-CM | POA: Diagnosis not present

## 2024-02-11 DIAGNOSIS — M5416 Radiculopathy, lumbar region: Secondary | ICD-10-CM | POA: Diagnosis not present

## 2024-02-11 DIAGNOSIS — Z510 Encounter for antineoplastic radiation therapy: Secondary | ICD-10-CM | POA: Diagnosis not present

## 2024-02-11 DIAGNOSIS — C8338 Diffuse large B-cell lymphoma, lymph nodes of multiple sites: Secondary | ICD-10-CM | POA: Diagnosis not present

## 2024-02-11 DIAGNOSIS — I824Z2 Acute embolism and thrombosis of unspecified deep veins of left distal lower extremity: Secondary | ICD-10-CM | POA: Diagnosis not present

## 2024-02-12 DIAGNOSIS — C8338 Diffuse large B-cell lymphoma, lymph nodes of multiple sites: Secondary | ICD-10-CM | POA: Diagnosis not present

## 2024-02-12 DIAGNOSIS — Z510 Encounter for antineoplastic radiation therapy: Secondary | ICD-10-CM | POA: Diagnosis not present

## 2024-02-13 DIAGNOSIS — C8338 Diffuse large B-cell lymphoma, lymph nodes of multiple sites: Secondary | ICD-10-CM | POA: Diagnosis not present

## 2024-02-13 DIAGNOSIS — Z510 Encounter for antineoplastic radiation therapy: Secondary | ICD-10-CM | POA: Diagnosis not present

## 2024-02-14 DIAGNOSIS — Z510 Encounter for antineoplastic radiation therapy: Secondary | ICD-10-CM | POA: Diagnosis not present

## 2024-02-14 DIAGNOSIS — C8338 Diffuse large B-cell lymphoma, lymph nodes of multiple sites: Secondary | ICD-10-CM | POA: Diagnosis not present

## 2024-02-15 DIAGNOSIS — C8338 Diffuse large B-cell lymphoma, lymph nodes of multiple sites: Secondary | ICD-10-CM | POA: Diagnosis not present

## 2024-02-15 DIAGNOSIS — Z510 Encounter for antineoplastic radiation therapy: Secondary | ICD-10-CM | POA: Diagnosis not present

## 2024-02-18 DIAGNOSIS — Z510 Encounter for antineoplastic radiation therapy: Secondary | ICD-10-CM | POA: Diagnosis not present

## 2024-02-18 DIAGNOSIS — C8338 Diffuse large B-cell lymphoma, lymph nodes of multiple sites: Secondary | ICD-10-CM | POA: Diagnosis not present

## 2024-02-19 DIAGNOSIS — Z510 Encounter for antineoplastic radiation therapy: Secondary | ICD-10-CM | POA: Diagnosis not present

## 2024-02-19 DIAGNOSIS — C8338 Diffuse large B-cell lymphoma, lymph nodes of multiple sites: Secondary | ICD-10-CM | POA: Diagnosis not present

## 2024-03-01 DIAGNOSIS — N433 Hydrocele, unspecified: Secondary | ICD-10-CM | POA: Diagnosis not present

## 2024-03-01 DIAGNOSIS — R935 Abnormal findings on diagnostic imaging of other abdominal regions, including retroperitoneum: Secondary | ICD-10-CM | POA: Diagnosis not present

## 2024-03-01 DIAGNOSIS — N492 Inflammatory disorders of scrotum: Secondary | ICD-10-CM | POA: Diagnosis not present

## 2024-03-01 DIAGNOSIS — C8335 Diffuse large B-cell lymphoma, lymph nodes of inguinal region and lower limb: Secondary | ICD-10-CM | POA: Diagnosis not present

## 2024-03-01 DIAGNOSIS — N5089 Other specified disorders of the male genital organs: Secondary | ICD-10-CM | POA: Diagnosis not present

## 2024-03-01 DIAGNOSIS — Z923 Personal history of irradiation: Secondary | ICD-10-CM | POA: Diagnosis not present

## 2024-03-01 DIAGNOSIS — Z9221 Personal history of antineoplastic chemotherapy: Secondary | ICD-10-CM | POA: Diagnosis not present

## 2024-03-01 DIAGNOSIS — Z87891 Personal history of nicotine dependence: Secondary | ICD-10-CM | POA: Diagnosis not present

## 2024-03-01 DIAGNOSIS — D3502 Benign neoplasm of left adrenal gland: Secondary | ICD-10-CM | POA: Diagnosis not present

## 2024-03-01 DIAGNOSIS — L598 Other specified disorders of the skin and subcutaneous tissue related to radiation: Secondary | ICD-10-CM | POA: Diagnosis not present

## 2024-03-11 DIAGNOSIS — C8338 Diffuse large B-cell lymphoma, lymph nodes of multiple sites: Secondary | ICD-10-CM | POA: Diagnosis not present

## 2024-03-11 DIAGNOSIS — N433 Hydrocele, unspecified: Secondary | ICD-10-CM | POA: Diagnosis not present

## 2024-03-11 DIAGNOSIS — Z8546 Personal history of malignant neoplasm of prostate: Secondary | ICD-10-CM | POA: Diagnosis not present

## 2024-03-14 DIAGNOSIS — Z6834 Body mass index (BMI) 34.0-34.9, adult: Secondary | ICD-10-CM | POA: Diagnosis not present

## 2024-03-14 DIAGNOSIS — E6609 Other obesity due to excess calories: Secondary | ICD-10-CM | POA: Diagnosis not present

## 2024-03-14 DIAGNOSIS — Z1331 Encounter for screening for depression: Secondary | ICD-10-CM | POA: Diagnosis not present

## 2024-03-14 DIAGNOSIS — Z0001 Encounter for general adult medical examination with abnormal findings: Secondary | ICD-10-CM | POA: Diagnosis not present

## 2024-03-20 DIAGNOSIS — C8338 Diffuse large B-cell lymphoma, lymph nodes of multiple sites: Secondary | ICD-10-CM | POA: Diagnosis not present

## 2024-04-14 DIAGNOSIS — E538 Deficiency of other specified B group vitamins: Secondary | ICD-10-CM | POA: Diagnosis not present

## 2024-04-14 DIAGNOSIS — Z125 Encounter for screening for malignant neoplasm of prostate: Secondary | ICD-10-CM | POA: Diagnosis not present

## 2024-04-14 DIAGNOSIS — Z0001 Encounter for general adult medical examination with abnormal findings: Secondary | ICD-10-CM | POA: Diagnosis not present

## 2024-04-14 DIAGNOSIS — D472 Monoclonal gammopathy: Secondary | ICD-10-CM | POA: Diagnosis not present

## 2024-04-14 DIAGNOSIS — C8510 Unspecified B-cell lymphoma, unspecified site: Secondary | ICD-10-CM | POA: Diagnosis not present

## 2024-04-14 DIAGNOSIS — Z8546 Personal history of malignant neoplasm of prostate: Secondary | ICD-10-CM | POA: Diagnosis not present

## 2024-04-14 DIAGNOSIS — Z87441 Personal history of nephrotic syndrome: Secondary | ICD-10-CM | POA: Diagnosis not present

## 2024-04-14 DIAGNOSIS — R7309 Other abnormal glucose: Secondary | ICD-10-CM | POA: Diagnosis not present

## 2024-04-14 DIAGNOSIS — E20811 Secondary hypoparathyroidism in diseases classified elsewhere: Secondary | ICD-10-CM | POA: Diagnosis not present

## 2024-04-14 DIAGNOSIS — E559 Vitamin D deficiency, unspecified: Secondary | ICD-10-CM | POA: Diagnosis not present

## 2024-04-29 DIAGNOSIS — N182 Chronic kidney disease, stage 2 (mild): Secondary | ICD-10-CM | POA: Diagnosis not present

## 2024-05-05 DIAGNOSIS — N041 Nephrotic syndrome with focal and segmental glomerular lesions: Secondary | ICD-10-CM | POA: Diagnosis not present

## 2024-05-05 DIAGNOSIS — R609 Edema, unspecified: Secondary | ICD-10-CM | POA: Diagnosis not present

## 2024-05-05 DIAGNOSIS — I10 Essential (primary) hypertension: Secondary | ICD-10-CM | POA: Diagnosis not present

## 2024-05-27 DIAGNOSIS — N182 Chronic kidney disease, stage 2 (mild): Secondary | ICD-10-CM | POA: Diagnosis not present

## 2024-05-28 DIAGNOSIS — Z923 Personal history of irradiation: Secondary | ICD-10-CM | POA: Diagnosis not present

## 2024-05-28 DIAGNOSIS — R9389 Abnormal findings on diagnostic imaging of other specified body structures: Secondary | ICD-10-CM | POA: Diagnosis not present

## 2024-05-28 DIAGNOSIS — C83398 Diffuse large b-cell lymphoma of other extranodal and solid organ sites: Secondary | ICD-10-CM | POA: Diagnosis not present

## 2024-05-28 DIAGNOSIS — C8330 Diffuse large B-cell lymphoma, unspecified site: Secondary | ICD-10-CM | POA: Diagnosis not present

## 2024-05-28 DIAGNOSIS — C8338 Diffuse large B-cell lymphoma, lymph nodes of multiple sites: Secondary | ICD-10-CM | POA: Diagnosis not present

## 2024-05-28 DIAGNOSIS — Z9221 Personal history of antineoplastic chemotherapy: Secondary | ICD-10-CM | POA: Diagnosis not present

## 2024-05-28 DIAGNOSIS — G834 Cauda equina syndrome: Secondary | ICD-10-CM | POA: Diagnosis not present

## 2024-07-17 DIAGNOSIS — Z6836 Body mass index (BMI) 36.0-36.9, adult: Secondary | ICD-10-CM | POA: Diagnosis not present

## 2024-07-17 DIAGNOSIS — M720 Palmar fascial fibromatosis [Dupuytren]: Secondary | ICD-10-CM | POA: Diagnosis not present

## 2024-07-17 DIAGNOSIS — E6609 Other obesity due to excess calories: Secondary | ICD-10-CM | POA: Diagnosis not present

## 2024-07-30 DIAGNOSIS — Z23 Encounter for immunization: Secondary | ICD-10-CM | POA: Diagnosis not present

## 2024-07-30 DIAGNOSIS — Z08 Encounter for follow-up examination after completed treatment for malignant neoplasm: Secondary | ICD-10-CM | POA: Diagnosis not present

## 2024-07-30 DIAGNOSIS — C8330 Diffuse large B-cell lymphoma, unspecified site: Secondary | ICD-10-CM | POA: Diagnosis not present

## 2024-07-30 DIAGNOSIS — C8338 Diffuse large B-cell lymphoma, lymph nodes of multiple sites: Secondary | ICD-10-CM | POA: Diagnosis not present

## 2024-07-30 DIAGNOSIS — Z8572 Personal history of non-Hodgkin lymphomas: Secondary | ICD-10-CM | POA: Diagnosis not present

## 2024-08-27 ENCOUNTER — Encounter (INDEPENDENT_AMBULATORY_CARE_PROVIDER_SITE_OTHER): Payer: Self-pay | Admitting: Gastroenterology

## 2024-10-08 ENCOUNTER — Telehealth: Payer: Self-pay

## 2024-10-08 NOTE — Telephone Encounter (Signed)
 Copied from CRM #[id number]. Topic: Medical Record Request - Other >> Oct 08, 2024  1:29 PM Pinkey ORN wrote: Reason for CRM: Medical Records >> Oct 08, 2024  1:30 PM Pinkey ORN wrote: Wanting to confirm rather or not patient's medical records had been received, coming from Marvine Rush, MD office. Please follow up.

## 2024-10-14 ENCOUNTER — Ambulatory Visit: Admitting: Family Medicine

## 2024-10-14 ENCOUNTER — Encounter: Payer: Self-pay | Admitting: Family Medicine

## 2024-10-14 VITALS — BP 122/82 | HR 84 | Temp 98.1°F | Ht 73.0 in | Wt 258.2 lb

## 2024-10-14 DIAGNOSIS — D472 Monoclonal gammopathy: Secondary | ICD-10-CM

## 2024-10-14 DIAGNOSIS — C61 Malignant neoplasm of prostate: Secondary | ICD-10-CM

## 2024-10-14 DIAGNOSIS — I10 Essential (primary) hypertension: Secondary | ICD-10-CM

## 2024-10-14 DIAGNOSIS — E66811 Obesity, class 1: Secondary | ICD-10-CM

## 2024-10-14 DIAGNOSIS — F17210 Nicotine dependence, cigarettes, uncomplicated: Secondary | ICD-10-CM | POA: Insufficient documentation

## 2024-10-14 DIAGNOSIS — C8330 Diffuse large B-cell lymphoma, unspecified site: Secondary | ICD-10-CM | POA: Insufficient documentation

## 2024-10-14 DIAGNOSIS — E209 Hypoparathyroidism, unspecified: Secondary | ICD-10-CM

## 2024-10-14 DIAGNOSIS — Z23 Encounter for immunization: Secondary | ICD-10-CM

## 2024-10-14 NOTE — Progress Notes (Signed)
 New Patient Office Visit  Patient ID: Gregory RIEGLER Sr., Male   DOB: [birth year] 73 y.o. MRN: [id number]  Chief Complaint  Patient presents with   Establish Care   Subjective:     Gregory Bialas Siddiqi Sr. presents to establish care. Oriented to practice routines and expectations.  PMH includes b-cell lymphoma, prostate CA, hypoparathyroidism, DVT, B12 deficiency, anxiety, HTN, obesity, lumbar stenosis, MUGS, left shoulder bone spurs Followed by Oncology  B-cell lymphoma: followed by oncology, s/p chemo and radiation  Prostate CA: followed by Urology Dr Sherrilee, s/p radiation, on Alfuzosin   Gout: on Allopurinol, no recent flare   HPI  Discussed the use of AI scribe software for clinical note transcription with the patient, who gave verbal consent to proceed.  History of Present Illness Gregory A Fullwood Sr. is a 73 year old male who presents for an established care visit.  He has a history of B cell lymphoma, initially presenting in his back, for which he underwent radiation and chemotherapy. His treatment is nearing completion, and he is currently on medication for this condition. He is scheduled to follow up with his oncologist in January.  He has a history of prostate cancer, treated with radiation therapy. He did not receive chemotherapy for this condition and is currently on alfuzosin .  He has a history of blood clots in his left leg, identified prior to last year, causing occasional pain and swelling. He is on Eliquis for anticoagulation.  He mentions a condition referred to as 'mugs,' treated in 2004. He does not require medication for blood sugar control and does not monitor his blood sugars.  He has bone spurs in his left shoulder, with an MRI available on a disc. He is scheduled to see an orthopedic specialist on the 19th.  He has a smoking history of about thirty years, currently smoking less than half a pack every two days. He has not had a lung CT scan.  He denies  anxiety, depression, heart failure, shortness of breath, and chest pain. His blood pressure is well controlled at home.   Outpatient Encounter Medications as of 10/14/2024  Medication Sig   acetaminophen  (TYLENOL ) 650 MG CR tablet Take 650-1,300 mg by mouth every 8 (eight) hours as needed for pain.   alfuzosin  (UROXATRAL ) 10 MG 24 hr tablet Take 1 tablet (10 mg total) by mouth in the morning and at bedtime.   apixaban (ELIQUIS) 5 MG TABS tablet Take 5 mg by mouth 2 (two) times daily.   Cholecalciferol (VITAMIN D3 PO) Take 1 tablet by mouth daily.   finasteride  (PROSCAR ) 5 MG tablet Take 1 tablet (5 mg total) by mouth daily. (Patient taking differently: Take 8 mg by mouth daily.)   furosemide  (LASIX ) 40 MG tablet Take 40 mg by mouth daily.   gabapentin (NEURONTIN) 300 MG capsule Take 300 mg by mouth. Tid as needed   Multiple Vitamin (MULTIVITAMIN WITH MINERALS) TABS tablet Take 1 tablet by mouth daily.   polyethylene glycol powder (GLYCOLAX /MIRALAX ) 17 GM/SCOOP powder Take 17 g by mouth daily.   allopurinol (ZYLOPRIM) 300 MG tablet Take 300 mg by mouth daily. (Patient not taking: Reported on 10/14/2024)   escitalopram (LEXAPRO) 20 MG tablet Take 20 mg by mouth daily. (Patient not taking: Reported on 10/14/2024)   fesoterodine  (TOVIAZ ) 8 MG TB24 tablet Take 1 tablet (8 mg total) by mouth daily. (Patient not taking: Reported on 10/14/2024)   lidocaine  (LIDODERM ) 5 % Place 1 patch onto the skin daily. Remove & Discard  patch within 12 hours or as directed by MD (Patient not taking: Reported on 10/14/2024)   predniSONE  (DELTASONE ) 50 MG tablet Take 50 mg by mouth daily with breakfast. Take 2 tablets on days 1 -5 days of chemo. (Patient not taking: Reported on 10/14/2024)   PRESCRIPTION MEDICATION Magic mouthwash as needed (Patient not taking: Reported on 10/14/2024)   PRESCRIPTION MEDICATION Chemo at [facility]. (Patient not taking: Reported on 10/14/2024)   [DISCONTINUED] diclofenac  Sodium (VOLTAREN ) 1 % GEL  Apply 4 g topically 4 (four) times daily.   [DISCONTINUED] lisinopril  (ZESTRIL ) 20 MG tablet Take 20 mg by mouth daily.   Facility-Administered Encounter Medications as of 10/14/2024  Medication   Chlorhexidine  Gluconate Cloth 2 % PADS 6 each   And   Chlorhexidine  Gluconate Cloth 2 % PADS 6 each    Past Medical History:  Diagnosis Date   Abnormal SPEP 09/09/2015   Anxiety    Arthritis    DDD, low back   Early cataracts, bilateral    Enlarged prostate    Heart murmur    was told at age 61, but has not had any problems   Hypertension    MGUS (monoclonal gammopathy of unknown significance) 01/29/2016   Peripheral vascular disease    Prostate cancer [facility])    Renal disorder     Past Surgical History:  Procedure Laterality Date   BACK SURGERY  2004, 2015   BIOPSY  06/05/2022   Procedure: BIOPSY;  Surgeon: Eartha Angelia Sieving, MD;  [facility];  Service: Gastroenterology;;   COLONOSCOPY     COLONOSCOPY WITH PROPOFOL  N/A 06/05/2022   Procedure: COLONOSCOPY WITH PROPOFOL ;  Surgeon: Eartha Angelia Sieving, MD;  Location: [facility];  Service: Gastroenterology;  Laterality: N/A;  115 ASA 2   CYSTOSCOPY WITH INJECTION N/A 03/13/2022   Procedure: CYSTOSCOPY WITH INJECTION- BOTOX  100 units;  Surgeon: Sherrilee Belvie CROME, MD;  Location: [facility];  Service: Urology;  Laterality: N/A;   EYE SURGERY     L eye- as a child    FRACTURE SURGERY Right    thumb   HEMORROIDECTOMY     HERNIA REPAIR     umbilical hernia repair   MASS EXCISION Right 07/11/2016   Procedure: EXCISION 6CM RIGHT SHOULDER MASS;  Surgeon: Selinda Artist Moats, MD;  Location: [facility];  Service: Vascular;  Laterality: Right;   POLYPECTOMY  06/05/2022   Procedure: POLYPECTOMY;  Surgeon: Eartha Angelia Sieving, MD;  Location: [facility];  Service: Gastroenterology;;   PROSTATE BIOPSY     TONSILLECTOMY     WOUND DEBRIDEMENT N/A 10/25/2018   Procedure: DEBRIDEMENT OF MUSCLE AND FASCIA AT UMBILICUS;   Surgeon: Kallie Manuelita BROCKS, MD;  Location: [facility];  Service: General;  Laterality: N/A;    Family History  Problem Relation Age of Onset   Gallstones Father    Ulcers Father    Alcohol abuse Father    Diabetes Sister    Breast cancer Sister    Diabetes Brother    Diabetes Daughter     Social History   Socioeconomic History   Marital status: Married    Spouse name: Not on file   Number of children: Not on file   Years of education: Not on file   Highest education level: 12th grade  Occupational History   Occupation: retire    Comment: 2014; truck driver  Tobacco Use   Smoking status: Some Days    Current packs/day: 0.50    Average packs/day: 0.5 packs/day  for 50.0 years (25.0 ttl pk-yrs)    Types: Cigarettes    Passive exposure: Past   Smokeless tobacco: Never  Vaping Use   Vaping status: Never Used  Substance and Sexual Activity   Alcohol use: Not Currently   Drug use: Never   Sexual activity: Not Currently    Birth control/protection: None  Other Topics Concern   Not on file  Social History Narrative   Not on file   Social Drivers of Health   Financial Resource Strain: High Risk (10/14/2024)   Overall Financial Resource Strain (CARDIA)    Difficulty of Paying Living Expenses: Hard  Food Insecurity: Food Insecurity Present (10/14/2024)   Hunger Vital Sign    Worried About Running Out of Food in the Last Year: Sometimes true    Ran Out of Food in the Last Year: Sometimes true  Transportation Needs: No Transportation Needs (10/14/2024)   PRAPARE - Administrator, Civil Service (Medical): No    Lack of Transportation (Non-Medical): No  Physical Activity: Inactive (10/14/2024)   Exercise Vital Sign    Days of Exercise per Week: 0 days    Minutes of Exercise per Session: Not on file  Stress: No Stress Concern Present (10/14/2024)   [employer] of Occupational Health - Occupational Stress Questionnaire    Feeling of Stress: Only a little  Social  Connections: Moderately Integrated (10/14/2024)   Social Connection and Isolation Panel    Frequency of Communication with Friends and Family: More than three times a week    Frequency of Social Gatherings with Friends and Family: More than three times a week    Attends Religious Services: More than 4 times per year    Active Member of [employer] or Organizations: No    Attends Engineer, Structural: Not on file    Marital Status: Married  Catering Manager Violence: Not on file    Review of Systems  All other systems reviewed and are negative.     Objective:    BP 122/82   Pulse 84   Temp 98.1 F (36.7 C)   Ht 6' 1 (1.854 m)   Wt 258 lb 3.2 oz (117.1 kg)   SpO2 96%   BMI 34.07 kg/m   Physical Exam Vitals and nursing note reviewed.  Constitutional:      Appearance: Normal appearance. He is normal weight.  HENT:     Head: Normocephalic and atraumatic.  Cardiovascular:     Rate and Rhythm: Normal rate and regular rhythm.     Pulses: Normal pulses.     Heart sounds: Normal heart sounds.  Pulmonary:     Effort: Pulmonary effort is normal.     Breath sounds: Normal breath sounds.  Skin:    General: Skin is warm and dry.     Capillary Refill: Capillary refill takes less than 2 seconds.  Neurological:     General: No focal deficit present.     Mental Status: He is alert and oriented to person, place, and time. Mental status is at baseline.  Psychiatric:        Mood and Affect: Mood normal.        Behavior: Behavior normal.        Thought Content: Thought content normal.        Judgment: Judgment normal.      Last CBC Lab Results  Component Value Date   WBC 6.2 07/26/2023   HGB 14.9 07/26/2023   HCT 46.5 07/26/2023  MCV 84.1 07/26/2023   MCH 26.9 07/26/2023   RDW 12.7 07/26/2023   PLT 363 07/26/2023   Last metabolic panel Lab Results  Component Value Date   GLUCOSE 131 (H) 07/26/2023   NA 133 (L) 07/26/2023   K 3.9 07/26/2023   CL 101 07/26/2023    CO2 20 (L) 07/26/2023   BUN 27 (H) 07/26/2023   CREATININE 1.24 07/26/2023   GFRNONAA >60 07/26/2023   CALCIUM 10.8 (H) 07/26/2023   PHOS 3.9 08/26/2015   PROT 6.9 07/26/2023   ALBUMIN 3.2 (L) 07/26/2023   LABGLOB 2.9 09/12/2016   AGRATIO 1.2 09/12/2016   BILITOT 0.7 07/26/2023   ALKPHOS 76 07/26/2023   AST 40 07/26/2023   ALT 36 07/26/2023   ANIONGAP 12 07/26/2023   Last lipids Lab Results  Component Value Date   CHOL 201 (H) 08/24/2015   HDL 35 (L) 08/24/2015   LDLCALC 137 (H) 08/24/2015   TRIG 144 08/24/2015   CHOLHDL 5.7 08/24/2015   Last hemoglobin A1c Lab Results  Component Value Date   HGBA1C 5.5 10/16/2018   Last thyroid  functions Lab Results  Component Value Date   TSH 1.917 08/22/2015   Last vitamin D No results found for: 25OHVITD2, 25OHVITD3, VD25OH Last vitamin B12 and Folate Lab Results  Component Value Date   VITAMINB12 583 09/13/2015   FOLATE 13.5 09/13/2015        Assessment & Plan:   Problem List Items Addressed This Visit     Hypertension - Primary   Relevant Medications   furosemide  (LASIX ) 40 MG tablet   Other Relevant Orders   CBC with Differential/Platelet   Comprehensive metabolic panel with GFR   Lipid panel   Hemoglobin A1c   MGUS (monoclonal gammopathy of unknown significance)   Malignant neoplasm of prostate (HCC)   Relevant Medications   allopurinol (ZYLOPRIM) 300 MG tablet   Diffuse large B-cell lymphoma (HCC)   Relevant Medications   allopurinol (ZYLOPRIM) 300 MG tablet   Cigarette nicotine dependence without complication   Relevant Orders   CT CHEST LUNG CA SCREEN LOW DOSE W/O CM   Other Visit Diagnoses       Hypoparathyroidism, unspecified hypoparathyroidism type       Relevant Orders   Comprehensive metabolic panel with GFR   Parathyroid hormone, intact (no Ca)   TSH     Class 1 obesity due to excess calories with serious comorbidity and body mass index (BMI) of 34.0 to 34.9 in adult       Relevant  Orders   CBC with Differential/Platelet   Comprehensive metabolic panel with GFR   Lipid panel   Hemoglobin A1c   VITAMIN D 25 Hydroxy (Vit-D Deficiency, Fractures)     Need for vaccination       Relevant Orders   Pneumococcal conjugate vaccine 20-valent   Varicella-zoster vaccine IM       Assessment and Plan Assessment & Plan Adult Wellness Visit Routine wellness visit. No new issues since last visit. - Performed blood work for anemia and infection. - Reviewed medical records. - Checked [place] Immunization Registry for vaccine status.  Establishment of care and general health maintenance Established care with new provider. Discussed health maintenance, vaccinations, and screenings. - Ordered lung cancer screening CT scan. - Checked [place] Immunization Registry for vaccine status.  Diffuse large B-cell lymphoma Treated with radiation and chemotherapy. Follow-up with oncology in January. - Continue current medication regimen. - Follow up with oncology in January.  Malignant neoplasm of prostate  Treated with radiation. Managed with alfuzosin . - Continue alfuzosin .  Deep vein thrombosis, left lower extremity Managed with Eliquis. - Continue Eliquis.  Primary hypertension Well controlled with home monitoring. - Continue current management.  Monoclonal gammopathy of undetermined significance (MGUS) Diagnosed in 2004.  Nicotine dependence, cigarettes Long history of smoking. Reducing to half a pack every two days. Discussed lung cancer screening. - Ordered lung cancer screening CT scan.  Left shoulder bone spurs Scheduled to see orthopedics on the 19th. - Continue follow up with orthopedics on the 19th.    Return in about 6 months (around 04/14/2025) for annual physical with labs 1 week prior.   Jeoffrey GORMAN Barrio, FNP [facility]

## 2024-10-14 NOTE — Patient Instructions (Signed)
 It was great to meet you today and I'm excited to have you join the [facility] practice. I hope you had a positive experience today! If you feel so inclined, please feel free to recommend our practice to friends and family. Jeoffrey Barrio, FNP-C

## 2024-10-15 LAB — CBC WITH DIFFERENTIAL/PLATELET
Absolute Lymphocytes: 1785 {cells}/uL (ref 850–3900)
Absolute Monocytes: 460 {cells}/uL (ref 200–950)
Basophils Absolute: 18 {cells}/uL (ref 0–200)
Basophils Relative: 0.4 %
Eosinophils Absolute: 110 {cells}/uL (ref 15–500)
Eosinophils Relative: 2.4 %
HCT: 43.5 % (ref 39.4–51.1)
Hemoglobin: 14 g/dL (ref 13.2–17.1)
MCH: 26.9 pg — ABNORMAL LOW (ref 27.0–33.0)
MCHC: 32.2 g/dL (ref 31.6–35.4)
MCV: 83.5 fL (ref 81.4–101.7)
MPV: 10.8 fL (ref 7.5–12.5)
Monocytes Relative: 10 %
Neutro Abs: 2226 {cells}/uL (ref 1500–7800)
Neutrophils Relative %: 48.4 %
Platelets: 219 Thousand/uL (ref 140–400)
RBC: 5.21 Million/uL (ref 4.20–5.80)
RDW: 12.7 % (ref 11.0–15.0)
Total Lymphocyte: 38.8 %
WBC: 4.6 Thousand/uL (ref 3.8–10.8)

## 2024-10-15 LAB — COMPREHENSIVE METABOLIC PANEL WITH GFR
AG Ratio: 1.7 (calc) (ref 1.0–2.5)
ALT: 12 U/L (ref 9–46)
AST: 21 U/L (ref 10–35)
Albumin: 3.8 g/dL (ref 3.6–5.1)
Alkaline phosphatase (APISO): 85 U/L (ref 35–144)
BUN/Creatinine Ratio: 17 (calc) (ref 6–22)
BUN: 23 mg/dL (ref 7–25)
CO2: 26 mmol/L (ref 20–32)
Calcium: 9 mg/dL (ref 8.6–10.3)
Chloride: 106 mmol/L (ref 98–110)
Creat: 1.35 mg/dL — ABNORMAL HIGH (ref 0.70–1.28)
Globulin: 2.3 g/dL (ref 1.9–3.7)
Glucose, Bld: 101 mg/dL — ABNORMAL HIGH (ref 65–99)
Potassium: 4.4 mmol/L (ref 3.5–5.3)
Sodium: 140 mmol/L (ref 135–146)
Total Bilirubin: 0.6 mg/dL (ref 0.2–1.2)
Total Protein: 6.1 g/dL (ref 6.1–8.1)
eGFR: 55 mL/min/1.73m2 — ABNORMAL LOW (ref >=60)

## 2024-10-15 LAB — LIPID PANEL
Cholesterol: 163 mg/dL (ref <200)
HDL: 56 mg/dL (ref >=40)
LDL Cholesterol (Calc): 89 mg/dL
Non-HDL Cholesterol (Calc): 107 mg/dL (ref <130)
Total CHOL/HDL Ratio: 2.9 (calc) (ref <5.0)
Triglycerides: 85 mg/dL (ref <150)

## 2024-10-15 LAB — HEMOGLOBIN A1C
Hgb A1c MFr Bld: 5.8 % — ABNORMAL HIGH (ref <5.7)
Mean Plasma Glucose: 120 mg/dL
eAG (mmol/L): 6.6 mmol/L

## 2024-10-15 LAB — TSH: TSH: 2.35 m[IU]/L (ref 0.40–4.50)

## 2024-10-15 LAB — PARATHYROID HORMONE, INTACT (NO CA): PTH: 28 pg/mL (ref 16–77)

## 2024-10-15 LAB — VITAMIN D 25 HYDROXY (VIT D DEFICIENCY, FRACTURES): Vit D, 25-Hydroxy: 92 ng/mL (ref 30–100)

## 2024-10-16 ENCOUNTER — Ambulatory Visit: Payer: Self-pay | Admitting: Family Medicine

## 2024-10-21 ENCOUNTER — Other Ambulatory Visit: Payer: Self-pay | Admitting: Family Medicine

## 2024-10-21 MED ORDER — APIXABAN 5 MG PO TABS: 5.0000 mg | ORAL_TABLET | Freq: Two times a day (BID) | ORAL | 1 refills | Status: AC

## 2024-10-31 ENCOUNTER — Other Ambulatory Visit: Payer: Self-pay | Admitting: Urology

## 2024-10-31 DIAGNOSIS — N3281 Overactive bladder: Secondary | ICD-10-CM

## 2024-10-31 DIAGNOSIS — N401 Enlarged prostate with lower urinary tract symptoms: Secondary | ICD-10-CM

## 2024-11-04 ENCOUNTER — Telehealth: Payer: Self-pay | Admitting: Acute Care

## 2024-11-04 DIAGNOSIS — Z87891 Personal history of nicotine dependence: Secondary | ICD-10-CM

## 2024-11-04 DIAGNOSIS — F17210 Nicotine dependence, cigarettes, uncomplicated: Secondary | ICD-10-CM

## 2024-11-04 DIAGNOSIS — Z122 Encounter for screening for malignant neoplasm of respiratory organs: Secondary | ICD-10-CM

## 2024-11-04 NOTE — Telephone Encounter (Signed)
 Lung Cancer Screening Narrative/Criteria Questionnaire (Cigarette Smokers Only- No Cigars/Pipes/vapes)   Gregory Diaz Sr.   SDMV:11/10/2024 1:00p [place]      08-02-1951   LDCT: 11/18/2024 1:00p GI    73 y.o.   Phone: [phone] - this is the daughter's line, [name] will conference call the pt in so he will answer the phone.   Lung Screening Narrative (confirm age 60-77 yrs Medicare / 50-80 yrs Private pay insurance)   Insurance information:[employer] mcr   Referring Provider:Amber Kayla FNP   This screening involves an initial phone call with a team member from our program. It is called a shared decision making visit. The initial meeting is required by  insurance and Medicare to make sure you understand the program. This appointment takes about 15-20 minutes to complete. You will complete the screening scan at your scheduled date/time.  This scan takes about 5-10 minutes to complete. You can eat and drink normally before and after the scan.  Criteria questions for Lung Cancer Screening:   Are you a current or former smoker? Current Age began smoking: 73yo   If you are a former smoker, what year did you quit smoking? N/A(within 15 yrs)   To calculate your smoking history, I need an accurate estimate of how many packs of cigarettes you smoked per day and for how many years. (Not just the number of PPD you are now smoking)   Years smoking 61 x Packs per day 1/2 = Pack years 30.5   (at least 20 pack yrs)   (Make sure they understand that we need to know how much they have smoked in the past, not just the number of PPD they are smoking now)  Do you have a personal history of cancer?  Yes - (type and when diagnosed - 5 yrs cancer free) Prostate dx in 2018, lymphoma - last radiation was in 02/2024.     Do you have a family history of cancer? Yes  (cancer type and and relative) daughter - thyroid , sister - breast   Are you coughing up blood?  No  Have you had unexplained weight loss of 15  lbs or more in the last 6 months? No  It looks like you meet all criteria.  When would be a good time for us  to schedule you for this screening?   Additional information: Patient is not a good historian, pt's wife and daughter helped give the smoking history. Patient's wife has been with the patient since the 3rd grade.

## 2024-11-10 ENCOUNTER — Ambulatory Visit: Admitting: Acute Care

## 2024-11-10 DIAGNOSIS — Z122 Encounter for screening for malignant neoplasm of respiratory organs: Secondary | ICD-10-CM | POA: Diagnosis not present

## 2024-11-10 DIAGNOSIS — Z87891 Personal history of nicotine dependence: Secondary | ICD-10-CM | POA: Diagnosis not present

## 2024-11-10 NOTE — Progress Notes (Signed)
 Virtual Visit via Telephone Note  I connected with Gregory A Oberle Sr. on 11/10/2024 at  1:00 PM EST by telephone and verified that I am speaking with the correct person using two identifiers.  Location: Patient: At home, in [place]   Provider: [address], [place], [place], [address]    I discussed the limitations, risks, security and privacy concerns of performing an evaluation and management service by telephone and the availability of in person appointments. I also discussed with the patient that there may be a patient responsible charge related to this service. The patient expressed understanding and agreed to proceed.  Shared Decision Making Visit Lung Cancer Screening Program [phone])   Eligibility: Age 10 y.o. Pack Years Smoking History Calculation 30.5 pack years (# packs/per year x # years smoked) Recent History of coughing up blood  no Unexplained weight loss? no ( >Than 15 pounds within the last 6 months ) Prior History Lung / other cancer no (Diagnosis within the last 5 years already requiring surveillance chest CT Scans). Smoking Status Former Smoker Former Smokers: Years since quit: N/A  Quit Date: Quit smoking in February but still has the occasional cigarette.   Visit Components: Discussion included one or more decision making aids. yes Discussion included risk/benefits of screening. yes Discussion included potential follow up diagnostic testing for abnormal scans. yes Discussion included meaning and risk of over diagnosis. yes Discussion included meaning and risk of False Positives. yes Discussion included meaning of total radiation exposure. yes  Counseling Included: Importance of adherence to annual lung cancer LDCT screening. yes Impact of comorbidities on ability to participate in the program. yes Ability and willingness to under diagnostic treatment. yes  Smoking Cessation Counseling: Current Smokers:  Discussed importance of smoking cessation.  N/A Information about tobacco cessation classes and interventions provided to patient. yes Patient provided with ticket for LDCT Scan. N/A Symptomatic Patient. N/A  Counseling Diagnosis Code: Tobacco Use Z72.0 Asymptomatic Patient N/A  Counseling  Former Smokers:  Discussed the importance of maintaining cigarette abstinence. yes Diagnosis Code: Personal History of Nicotine Dependence. S12.108 Information about tobacco cessation classes and interventions provided to patient. Yes Patient provided with ticket for LDCT Scan. N/A Written Order for Lung Cancer Screening with LDCT placed in Epic. Yes (CT Chest Lung Cancer Screening Low Dose W/O CM) [id number] Z12.2-Screening of respiratory organs Z87.891-Personal history of nicotine dependence  Shared decision visit completed by Gregory Georgia, Gregory Diaz as a registered nurse awaiting credentialing.    Gregory CHRISTELLA Georgia, Gregory Diaz

## 2024-11-10 NOTE — Patient Instructions (Addendum)
 Thank you for participating in the [facility] Lung Cancer Screening Program. It was our pleasure to meet you today. We will call you with the results of your scan within the next few days. Your scan will be assigned a Lung RADS category score by the physicians reading the scans.  This Lung RADS score determines follow up scanning.  See below for description of categories, and follow up screening recommendations. We will be in touch to schedule your follow up screening annually or based on recommendations of our providers. We will fax a copy of your scan results to your Primary Care Physician, or the physician who referred you to the program, to ensure they have the results. Please call the office if you have any questions or concerns regarding your scanning experience or results.  Our office number is [phone]. Please speak with Karna Curly, RN., Karna Doom RN, or [facility] RN, and Isaiah Dover RN. They are  our Lung Cancer Screening RN.'s If They are unavailable when you call, Please leave a message on the voice mail. We will return your call at our earliest convenience.This voice mail is monitored several times a day.  Remember, if your scan is normal, we will scan you annually as long as you continue to meet the criteria for the program. (Age 75-80, Current smoker or smoker who has quit within the last 15 years). If you are a smoker, remember, quitting is the single most powerful action that you can take to decrease your risk of lung cancer and other pulmonary, breathing related problems. We know quitting is hard, and we are here to help.  Please let us  know if there is anything we can do to help you meet your goal of quitting. If you are a former smoker, counselling psychologist. We are proud of you! Remain smoke free! Remember you can refer friends or family members through the number above.  We will screen them to make sure they meet criteria for the program. Thank you for helping us   take better care of you by participating in Lung Screening.  For Virtual Smoking Cessation Classes , The American Lung Association Provides  Freedom From Smoking Classes.  Please search their website for dates and times.    Lung RADS Categories:  Lung RADS 1: no nodules or definitely non-concerning nodules.  Recommendation is for a repeat annual scan in 12 months.  Lung RADS 2:  nodules that are non-concerning in appearance and behavior with a very low likelihood of becoming an active cancer. Recommendation is for a repeat annual scan in 12 months.  Lung RADS 3: nodules that are probably non-concerning , includes nodules with a low likelihood of becoming an active cancer.  Recommendation is for a 77-month repeat screening scan. Often noted after an upper respiratory illness. We will be in touch to make sure you have no questions, and to schedule your 75-month scan.  Lung RADS 4 A: nodules with concerning findings, recommendation is most often for a follow up scan in 3 months or additional testing based on our provider's assessment of the scan. We will be in touch to make sure you have no questions and to schedule the recommended 3 month follow up scan.  Lung RADS 4 B:  indicates findings that are concerning. We will be in touch with you to schedule additional diagnostic testing based on our provider's  assessment of the scan.  You can receive free nicotine replacement therapy ( patches, gum or mints) by calling 1-800-QUIT NOW.  Please call so we can get you on the path to becoming  a non-smoker. I know it is hard, but you can do this!  Other options for assistance in smoking cessation ( As covered by your insurance benefits)  Hypnosis for smoking cessation  [employer] [phone]  Acupuncture for smoking cessation  [facility] [phone]    Freedom From Smoking  Virtual Group, FREE to [place] residents  (Class sizes are capped at 16 people) January  7th-February18th Wednesdays 6:15 pm- 7:45 pm  [email or web address] Contact:   Kimetha Fulwood   [email or web address]  [phone]   Your CT scan is scheduled for 11/18/2024 at 1 pm at [facility].

## 2024-11-12 ENCOUNTER — Other Ambulatory Visit: Payer: Medicare HMO

## 2024-11-14 ENCOUNTER — Encounter: Payer: Self-pay | Admitting: Acute Care

## 2024-11-14 ENCOUNTER — Other Ambulatory Visit

## 2024-11-18 ENCOUNTER — Ambulatory Visit
Admission: RE | Admit: 2024-11-18 | Discharge: 2024-11-18 | Disposition: A | Source: Ambulatory Visit | Attending: Acute Care | Admitting: Acute Care

## 2024-11-18 DIAGNOSIS — F17210 Nicotine dependence, cigarettes, uncomplicated: Secondary | ICD-10-CM

## 2024-11-18 DIAGNOSIS — Z87891 Personal history of nicotine dependence: Secondary | ICD-10-CM

## 2024-11-18 DIAGNOSIS — Z122 Encounter for screening for malignant neoplasm of respiratory organs: Secondary | ICD-10-CM

## 2024-11-19 ENCOUNTER — Ambulatory Visit: Payer: Medicare HMO | Admitting: Urology

## 2024-11-21 ENCOUNTER — Other Ambulatory Visit: Payer: Self-pay | Admitting: Orthopaedic Surgery

## 2024-11-21 DIAGNOSIS — M19012 Primary osteoarthritis, left shoulder: Secondary | ICD-10-CM

## 2024-11-26 ENCOUNTER — Telehealth: Payer: Self-pay | Admitting: Acute Care

## 2024-11-26 NOTE — Telephone Encounter (Signed)
 12 month follow up. He needs referral to thoracic surgery for the aneurysm. He has hypertension and needs to be closely monitored.  Please fax results to PCP. Thanks

## 2024-11-27 ENCOUNTER — Other Ambulatory Visit: Payer: Self-pay

## 2024-11-27 DIAGNOSIS — Z87891 Personal history of nicotine dependence: Secondary | ICD-10-CM

## 2024-11-27 DIAGNOSIS — Z122 Encounter for screening for malignant neoplasm of respiratory organs: Secondary | ICD-10-CM

## 2024-11-27 DIAGNOSIS — F17210 Nicotine dependence, cigarettes, uncomplicated: Secondary | ICD-10-CM

## 2024-11-27 NOTE — Telephone Encounter (Signed)
 Spoke with patient/family and reviewed recent Lung CT results. He will complete an annual Lung CT again next year. Order placed. He has been referred to Cardiothoracic for a 5.0 cm aneurysm. Per spouse he has not take BP meds in 2 years. Last BP 118/72. Results and plan to PCP.

## 2024-12-04 ENCOUNTER — Encounter: Payer: Self-pay | Admitting: Family Medicine

## 2024-12-09 ENCOUNTER — Telehealth: Payer: Self-pay | Admitting: Urology

## 2024-12-09 DIAGNOSIS — C61 Malignant neoplasm of prostate: Secondary | ICD-10-CM

## 2024-12-09 NOTE — Telephone Encounter (Signed)
 Gregory Diaz

## 2024-12-09 NOTE — Telephone Encounter (Signed)
 Spoke with daughter who states cancer doctor was going to set patient up with a Insurance Underwriter. Family informed them they already had one. Cancer doctor recommended imaging order attached.  Daughter would like to know if you can order the requested imaging?

## 2024-12-11 ENCOUNTER — Ambulatory Visit

## 2024-12-11 ENCOUNTER — Telehealth: Payer: Self-pay

## 2024-12-11 ENCOUNTER — Encounter: Payer: Self-pay | Admitting: Family Medicine

## 2024-12-11 ENCOUNTER — Ambulatory Visit: Admitting: Family Medicine

## 2024-12-11 VITALS — BP 115/72 | HR 81 | Ht 73.0 in | Wt 272.4 lb

## 2024-12-11 DIAGNOSIS — M75102 Unspecified rotator cuff tear or rupture of left shoulder, not specified as traumatic: Secondary | ICD-10-CM | POA: Insufficient documentation

## 2024-12-11 DIAGNOSIS — I10 Essential (primary) hypertension: Secondary | ICD-10-CM

## 2024-12-11 DIAGNOSIS — F411 Generalized anxiety disorder: Secondary | ICD-10-CM | POA: Diagnosis not present

## 2024-12-11 DIAGNOSIS — I7121 Aneurysm of the ascending aorta, without rupture: Secondary | ICD-10-CM | POA: Insufficient documentation

## 2024-12-11 MED ORDER — ESCITALOPRAM OXALATE 10 MG PO TABS: 10.0000 mg | ORAL_TABLET | Freq: Every day | ORAL | 1 refills | Status: AC

## 2024-12-11 NOTE — Progress Notes (Signed)
 "  Acute Office Visit  Patient ID: Gregory Swicegood Aaron Sr., male    DOB: [birth year], 74 y.o.   MRN: [id number]  PCP: Kayla Jeoffrey RAMAN, FNP  Chief Complaint  Patient presents with   Acute Visit    Issue w/ shoulder lft shoulder pain . X 6 months requesting a referral for Gregory Diaz for further eval      Subjective:     HPI  Discussed the use of AI scribe software for clinical note transcription with the patient, who gave verbal consent to proceed.  History of Present Illness Gregory A Grimm Sr. is a 74 year old male who presents with shoulder pain and consideration for surgery.  He has been experiencing ongoing issues with his shoulder, describing it as 'out of whack' with a diagnosis indicating the rotator cuff is 'gone.' He has been unable to lift his arm properly for over a year, and the condition is worsening, leaving him feeling 'handicapped' due to lack of control. The pain is described as feeling like his shoulder is 'in a vice.' He has not received any injections for the shoulder and wishes not to.  He has a history of cancer, which is currently in remission. He had a check-up about three weeks ago, and his oncologist has been informed about the potential shoulder surgery. There is a concern regarding an area in his scrotum, and he is scheduled to see a urologist, Dr. Sherrilee, on February 19th for further evaluation.  He has a history of blood clots and was previously on blood thinners, which may need to be managed around the time of surgery. He also mentions a widening of the aorta seen on imaging, and he is scheduled to see a cardiothoracic surgeon on March 11th for further assessment.  His blood pressure reading today was 115. He uses a cane for stability due to mobility issues, and his weight is noted to be a risk factor. He is not currently on prednisone  but has been in the past. He relies on his daughter and wife to help manage his medications.   Review of Systems  All  other systems reviewed and are negative.   Past Medical History:  Diagnosis Date   Abnormal SPEP 09/09/2015   Anxiety    Arthritis    DDD, low back   Early cataracts, bilateral    Enlarged prostate    Heart murmur    was told at age 44, but has not had any problems   Hypertension    MGUS (monoclonal gammopathy of unknown significance) 01/29/2016   Peripheral vascular disease    Prostate cancer [facility])    Renal disorder     Past Surgical History:  Procedure Laterality Date   BACK SURGERY  2004, 2015   BIOPSY  06/05/2022   Procedure: BIOPSY;  Surgeon: Eartha Angelia Sieving, MD;  [facility];  Service: Gastroenterology;;   COLONOSCOPY     COLONOSCOPY WITH PROPOFOL  N/A 06/05/2022   Procedure: COLONOSCOPY WITH PROPOFOL ;  Surgeon: Eartha Angelia Sieving, MD;  Location: [facility];  Service: Gastroenterology;  Laterality: N/A;  115 ASA 2   CYSTOSCOPY WITH INJECTION N/A 03/13/2022   Procedure: CYSTOSCOPY WITH INJECTION- BOTOX  100 units;  Surgeon: Sherrilee Belvie CROME, MD;  Location: [facility];  Service: Urology;  Laterality: N/A;   EYE SURGERY     L eye- as a child    FRACTURE SURGERY Right    thumb   HEMORROIDECTOMY     HERNIA REPAIR  umbilical hernia repair   MASS EXCISION Right 07/11/2016   Procedure: EXCISION 6CM RIGHT SHOULDER MASS;  Surgeon: Selinda Artist Moats, MD;  Location: [facility];  Service: Vascular;  Laterality: Right;   POLYPECTOMY  06/05/2022   Procedure: POLYPECTOMY;  Surgeon: Eartha Angelia Sieving, MD;  [facility]: [facility];  Service: Gastroenterology;;   PROSTATE BIOPSY     TONSILLECTOMY     WOUND DEBRIDEMENT N/A 10/25/2018   Procedure: DEBRIDEMENT OF MUSCLE AND FASCIA AT UMBILICUS;  Surgeon: Kallie Manuelita BROCKS, MD;  Location: [facility];  Service: General;  Laterality: N/A;    Outpatient Medications Prior to Visit  Medication Sig Dispense Refill   acetaminophen  (TYLENOL ) 650 MG CR tablet Take 650-1,300 mg by mouth every 8 (eight) hours as needed  for pain.     alfuzosin  (UROXATRAL ) 10 MG 24 hr tablet TAKE 1 TABLET BY MOUTH IN THE MORNING AND AT BEDTIME 180 tablet 0   apixaban  (ELIQUIS ) 5 MG TABS tablet Take 1 tablet (5 mg total) by mouth 2 (two) times daily. 60 tablet 1   Cholecalciferol (VITAMIN D3 PO) Take 1 tablet by mouth daily.     finasteride  (PROSCAR ) 5 MG tablet Take 1 tablet by mouth once daily 90 tablet 0   furosemide  (LASIX ) 40 MG tablet Take 40 mg by mouth daily.     gabapentin (NEURONTIN) 300 MG capsule Take 300 mg by mouth. Tid as needed     Multiple Vitamin (MULTIVITAMIN WITH MINERALS) TABS tablet Take 1 tablet by mouth daily.     polyethylene glycol powder (GLYCOLAX /MIRALAX ) 17 GM/SCOOP powder Take 17 g by mouth daily. 255 g 0   allopurinol (ZYLOPRIM) 300 MG tablet Take 300 mg by mouth daily. (Patient not taking: Reported on 12/11/2024)     fesoterodine  (TOVIAZ ) 8 MG TB24 tablet Take 1 tablet (8 mg total) by mouth daily. (Patient not taking: Reported on 12/11/2024) 30 tablet 6   lidocaine  (LIDODERM ) 5 % Place 1 patch onto the skin daily. Remove & Discard patch within 12 hours or as directed by MD (Patient not taking: Reported on 12/11/2024) 30 patch 0   predniSONE  (DELTASONE ) 50 MG tablet Take 50 mg by mouth daily with breakfast. Take 2 tablets on days 1 -5 days of chemo. (Patient not taking: Reported on 12/11/2024)     PRESCRIPTION MEDICATION Magic mouthwash as needed (Patient not taking: Reported on 12/11/2024)     PRESCRIPTION MEDICATION Chemo at [facility]. (Patient not taking: Reported on 12/11/2024)     escitalopram  (LEXAPRO ) 20 MG tablet Take 20 mg by mouth daily. (Patient not taking: Reported on 12/11/2024)     Facility-Administered Medications Prior to Visit  Medication Dose Route Frequency Provider Last Rate Last Admin   Chlorhexidine  Gluconate Cloth 2 % PADS 6 each  6 each Topical Once Davis, Jason Evan, MD       And   Chlorhexidine  Gluconate Cloth 2 % PADS 6 each  6 each Topical Once Davis, Jason Evan, MD         Allergies[1]     Objective:    BP 115/72   Pulse 81   Ht 6' 1 (1.854 m)   Wt 272 lb 6.4 oz (123.6 kg)   SpO2 97%   BMI 35.94 kg/m    Physical Exam Vitals and nursing note reviewed.  Constitutional:      Appearance: Normal appearance. He is obese.     Comments: Ambulating with cane  HENT:     Head: Normocephalic and atraumatic.  Cardiovascular:  Rate and Rhythm: Normal rate and regular rhythm.     Pulses: Normal pulses.     Heart sounds: Normal heart sounds.  Pulmonary:     Effort: Pulmonary effort is normal.     Breath sounds: Normal breath sounds.  Skin:    General: Skin is warm and dry.     Capillary Refill: Capillary refill takes less than 2 seconds.  Neurological:     General: No focal deficit present.     Mental Status: He is alert and oriented to person, place, and time. Mental status is at baseline.  Psychiatric:        Mood and Affect: Mood normal.        Behavior: Behavior normal.        Thought Content: Thought content normal.        Judgment: Judgment normal.       No results found for any visits on 12/11/24.     Assessment & Plan:   Problem List Items Addressed This Visit   None   Assessment and Plan Assessment & Plan Left rotator cuff tear with shoulder immobility Chronic tear with significant immobility. Desires surgery despite above average risk due to age, weight, and mobility. Informed about injection option if surgery delayed. - Faxed surgical clearance form to orthopedic surgeon. - Request clearance from oncology and cardiothoracic surgery. - Consider shoulder injection if surgery delayed.  Aortic dilation 5.0cm. Noted on imaging. Scheduled for cardiothoracic evaluation. - Ensure cardiothoracic surgeon clearance before shoulder surgery.  Primary hypertension Well controlled with current regimen. Blood pressure 115/72 mmHg. - Continue current antihypertensive regimen.  Class 1 obesity due to excess calories with serious  comorbidity and body mass index (BMI) of 34.0 to 34.9 in adult Class 1 obesity with BMI 34.0 to 34.9. Risk factor for surgical complications.  Anxiety Exacerbated by worry about his shoulder pain and scheduling surgery - Denies SI/HI - Would like to resume Lexapro . Reordered to start 10mg  daily. - Follow up in 4-6 weeks.    Meds ordered this encounter  Medications   escitalopram  (LEXAPRO ) 10 MG tablet    Sig: Take 1 tablet (10 mg total) by mouth daily.    Dispense:  30 tablet    Refill:  1    Supervising Provider:   DUANNE LOWERS T [[id number]]    Return in about 6 weeks (around 01/22/2025) for anxiety/depression.  Jeoffrey GORMAN Barrio, FNP [facility]       [1]  Allergies Allergen Reactions   Cephalosporins Other (See Comments)    Cephalosporin (substance)  This medication is contraindicated within 72 hours of high-dose methotrexate administration or until complete methotrexate elimination, whichever is longer.   Nsaids Other (See Comments)    Non-steroidal anti-inflammatory agent (substance)  This medication is contraindicated within 72 hours of high-dose methotrexate administration or until complete methotrexate elimination, whichever is longer.   Penicillin G Benzathine Other (See Comments)   Penicillins Other (See Comments) and Swelling    Has patient had a PCN reaction causing immediate rash, facial/tongue/throat swelling, SOB or lightheadedness with hypotension: Yes  Has patient had a PCN reaction causing severe rash involving mucus membranes or skin necrosis: No  Has patient had a PCN reaction that required hospitalization No  Has patient had a PCN reaction occurring within the last 10 years: No  If all of the above answers are NO, then may proceed with Cephalosporin use.   Proton Pump Inhibitors Other (See Comments)    This  medication is contraindicated within 72 hours of high-dose methotrexate administration or until complete  methotrexate elimination, whichever is longer.   Sulfa  Antibiotics Other (See Comments)    Substance with sulfonamide structure and antibacterial mechanism of action (substance)  This medication is contraindicated within 72 hours of high-dose methotrexate administration or until complete methotrexate elimination, whichever is longer.   "

## 2024-12-11 NOTE — Telephone Encounter (Signed)
 [facility] FAXED IN PREOPERATIVE CLEARANCE FORM ON 11/22/2023  [facility]: ALL CASES WILL HAVE A HOSPITAL PAT APPT. W/ LABS PER ANESTHESIA PROTOCOL 10-14 DAYS PRIOR TO SURGERY. WE WILL MANAGE Hgb A1c. ORDER ADDITIONAL LABS IF NEEDED ( lABS VALID FOR 3 MONTHS) .   ANTICIPATED ANESTHETIC TYPE : GENERAL  INTERSCALENE BLOCK : DATE OF SURGERY TBD.   CLEARED: MEDICAL ONLY.   ABOVE AVERAGE RISK DUE TO AGE, SYSTEMIC DISEASE, CANCER, IMMUNO SUPPRESSIVE TX, HTN, MAKE. BMI, 5.0 CM. ANEURYSM. PLEASE SEEK INPUT RE RISK FROM ONCOLOGY AND CT SURGEON.   SURGICAL CLEARANCE REVIEWED AND SIGNED BY PROVIDER AMBER HOWARD. ON 12/12/2023. FAXED TO [facility] BY CMA SRP. TO:  PHONE : [phone] FAX : [phone]

## 2024-12-12 NOTE — Telephone Encounter (Signed)
 Wife called and made aware of ultrasound order and number given to schedule.  Wife voiced understanding.

## 2024-12-18 ENCOUNTER — Ambulatory Visit (HOSPITAL_COMMUNITY): Admission: RE | Admit: 2024-12-18 | Discharge: 2024-12-18 | Attending: Urology

## 2025-01-01 ENCOUNTER — Other Ambulatory Visit

## 2025-01-09 ENCOUNTER — Ambulatory Visit: Admitting: Urology

## 2025-01-14 ENCOUNTER — Ambulatory Visit: Admitting: Family Medicine

## 2025-01-21 ENCOUNTER — Encounter: Admitting: Surgery

## 2025-04-09 ENCOUNTER — Other Ambulatory Visit

## 2025-04-15 ENCOUNTER — Encounter: Admitting: Family Medicine
